# Patient Record
Sex: Female | Born: 1975 | Race: White | Hispanic: No | Marital: Married | Smoking: Former smoker
Health system: Southern US, Community
[De-identification: ages and names within clinical notes are randomized; demographics above are authoritative.]

## PROBLEM LIST (undated history)

## (undated) ENCOUNTER — Inpatient Hospital Stay (HOSPITAL_COMMUNITY): Payer: Self-pay

## (undated) DIAGNOSIS — Z806 Family history of leukemia: Secondary | ICD-10-CM

## (undated) DIAGNOSIS — Z789 Other specified health status: Secondary | ICD-10-CM

## (undated) DIAGNOSIS — Z803 Family history of malignant neoplasm of breast: Secondary | ICD-10-CM

## (undated) DIAGNOSIS — R12 Heartburn: Secondary | ICD-10-CM

## (undated) DIAGNOSIS — I10 Essential (primary) hypertension: Secondary | ICD-10-CM

## (undated) DIAGNOSIS — K219 Gastro-esophageal reflux disease without esophagitis: Secondary | ICD-10-CM

## (undated) DIAGNOSIS — N979 Female infertility, unspecified: Secondary | ICD-10-CM

## (undated) DIAGNOSIS — R011 Cardiac murmur, unspecified: Secondary | ICD-10-CM

## (undated) DIAGNOSIS — T7840XA Allergy, unspecified, initial encounter: Secondary | ICD-10-CM

## (undated) DIAGNOSIS — C801 Malignant (primary) neoplasm, unspecified: Secondary | ICD-10-CM

## (undated) DIAGNOSIS — O26899 Other specified pregnancy related conditions, unspecified trimester: Secondary | ICD-10-CM

## (undated) HISTORY — DX: Family history of malignant neoplasm of breast: Z80.3

## (undated) HISTORY — PX: WISDOM TOOTH EXTRACTION: SHX21

## (undated) HISTORY — DX: Female infertility, unspecified: N97.9

## (undated) HISTORY — DX: Allergy, unspecified, initial encounter: T78.40XA

## (undated) HISTORY — DX: Family history of leukemia: Z80.6

## (undated) HISTORY — PX: DILATION AND CURETTAGE OF UTERUS: SHX78

---

## 2007-10-01 ENCOUNTER — Inpatient Hospital Stay (HOSPITAL_COMMUNITY): Admission: AD | Admit: 2007-10-01 | Discharge: 2007-10-01 | Payer: Self-pay | Admitting: Obstetrics & Gynecology

## 2008-11-26 ENCOUNTER — Ambulatory Visit (HOSPITAL_COMMUNITY): Admission: RE | Admit: 2008-11-26 | Discharge: 2008-11-26 | Payer: Self-pay | Admitting: Obstetrics and Gynecology

## 2009-10-09 ENCOUNTER — Ambulatory Visit (HOSPITAL_COMMUNITY): Admission: RE | Admit: 2009-10-09 | Discharge: 2009-10-09 | Payer: Self-pay | Admitting: Obstetrics and Gynecology

## 2009-10-16 ENCOUNTER — Ambulatory Visit (HOSPITAL_COMMUNITY): Admission: RE | Admit: 2009-10-16 | Discharge: 2009-10-16 | Payer: Self-pay | Admitting: Obstetrics and Gynecology

## 2009-10-25 DEATH — deceased

## 2010-10-20 LAB — URINALYSIS, ROUTINE W REFLEX MICROSCOPIC
Bilirubin Urine: NEGATIVE
Glucose, UA: NEGATIVE mg/dL
Nitrite: NEGATIVE
Urobilinogen, UA: 0.2 mg/dL (ref 0.0–1.0)

## 2010-10-20 LAB — CBC
HCT: 38.9 % (ref 36.0–46.0)
MCHC: 34 g/dL (ref 30.0–36.0)
Platelets: 294 10*3/uL (ref 150–400)
WBC: 6.6 10*3/uL (ref 4.0–10.5)

## 2010-10-20 LAB — LUPUS ANTICOAGULANT PANEL
Lupus Anticoagulant: NOT DETECTED
PTT Lupus Anticoagulant: 44.6 secs — ABNORMAL HIGH (ref 32.0–43.4)
PTTLA 4:1 Mix: 42 secs (ref 36.3–48.8)

## 2010-10-20 LAB — TSH: TSH: 5.24 u[IU]/mL — ABNORMAL HIGH (ref 0.350–4.500)

## 2010-10-20 LAB — CARDIOLIPIN ANTIBODIES, IGM+IGG: Anticardiolipin IgM: 1 MPL U/mL — ABNORMAL LOW (ref <11)

## 2011-06-10 ENCOUNTER — Other Ambulatory Visit: Payer: Self-pay

## 2011-09-11 LAB — OB RESULTS CONSOLE HEPATITIS B SURFACE ANTIGEN: Hepatitis B Surface Ag: NEGATIVE

## 2011-09-11 LAB — OB RESULTS CONSOLE GC/CHLAMYDIA: Chlamydia: NEGATIVE

## 2011-09-30 ENCOUNTER — Other Ambulatory Visit: Payer: Self-pay | Admitting: Physician Assistant

## 2011-09-30 MED ORDER — PROGESTERONE MICRONIZED 200 MG PO CAPS: 200.0000 mg | ORAL_CAPSULE | Freq: Every day | ORAL | Status: DC

## 2011-09-30 NOTE — Telephone Encounter (Signed)
Pt cannot get in touch with OB for a RF of her progesterone and her Rx from mail order will be delivered in 3 days.  She is a high risk pregnancy and on progesterone to decrease her chances of miscarriage.

## 2012-03-24 ENCOUNTER — Encounter (HOSPITAL_COMMUNITY): Payer: Self-pay

## 2012-03-31 ENCOUNTER — Encounter (HOSPITAL_COMMUNITY): Payer: Self-pay

## 2012-04-01 ENCOUNTER — Encounter (HOSPITAL_COMMUNITY)
Admission: RE | Admit: 2012-04-01 | Discharge: 2012-04-01 | Disposition: A | Discharge status: Home / Self Care | Payer: BC Managed Care – PPO | Source: Ambulatory Visit | Attending: Obstetrics and Gynecology | Admitting: Obstetrics and Gynecology

## 2012-04-01 ENCOUNTER — Encounter (HOSPITAL_COMMUNITY): Payer: Self-pay

## 2012-04-01 HISTORY — DX: Heartburn: R12

## 2012-04-01 HISTORY — DX: Other specified health status: Z78.9

## 2012-04-01 HISTORY — DX: Other specified pregnancy related conditions, unspecified trimester: O26.899

## 2012-04-01 LAB — SURGICAL PCR SCREEN
MRSA, PCR: NEGATIVE
Staphylococcus aureus: NEGATIVE

## 2012-04-01 LAB — CBC
HCT: 36.2 % (ref 36.0–46.0)
Hemoglobin: 12.4 g/dL (ref 12.0–15.0)
MCHC: 34.3 g/dL (ref 30.0–36.0)
RBC: 4.07 MIL/uL (ref 3.87–5.11)
RDW: 13.9 % (ref 11.5–15.5)
WBC: 7.9 10*3/uL (ref 4.0–10.5)

## 2012-04-01 NOTE — Patient Instructions (Addendum)
Your procedure is scheduled on:04/04/12  Enter through the Main Entrance at :10 am Pick up desk phone and dial [phone] and inform us of your arrival.  Please call [phone] if you have any problems the morning of surgery.  Remember: Do not eat after midnight:Sunday Do not drink after:0730 am- water only Take these meds the morning of surgery with a sip of water:none  DO NOT wear jewelry, eye make-up, lipstick,body lotion, or dark fingernail polish. Do not shave for 48 hours prior to surgery.  If you are to be admitted after surgery, leave suitcase in car until your room has been assigned. Patients discharged on the day of surgery will not be allowed to drive home.   Remember to use your Hibiclens as instructed.

## 2012-04-04 ENCOUNTER — Encounter (HOSPITAL_COMMUNITY): Payer: Self-pay | Admitting: Anesthesiology

## 2012-04-04 ENCOUNTER — Encounter (HOSPITAL_COMMUNITY)
Admission: AD | Disposition: A | Discharge status: Home / Self Care | Payer: Self-pay | Source: Ambulatory Visit | Attending: Obstetrics and Gynecology

## 2012-04-04 ENCOUNTER — Inpatient Hospital Stay (HOSPITAL_COMMUNITY): Payer: BC Managed Care – PPO

## 2012-04-04 ENCOUNTER — Encounter (HOSPITAL_COMMUNITY): Payer: Self-pay | Admitting: General Surgery

## 2012-04-04 ENCOUNTER — Inpatient Hospital Stay (HOSPITAL_COMMUNITY)
Admission: AD | Admit: 2012-04-04 | Discharge: 2012-04-06 | DRG: 371 | Disposition: A | Discharge status: Home / Self Care | Payer: BC Managed Care – PPO | Source: Ambulatory Visit | Attending: Obstetrics and Gynecology | Admitting: Obstetrics and Gynecology

## 2012-04-04 ENCOUNTER — Encounter (HOSPITAL_COMMUNITY): Payer: Self-pay

## 2012-04-04 DIAGNOSIS — D259 Leiomyoma of uterus, unspecified: Secondary | ICD-10-CM | POA: Diagnosis present

## 2012-04-04 DIAGNOSIS — O09529 Supervision of elderly multigravida, unspecified trimester: Secondary | ICD-10-CM | POA: Diagnosis present

## 2012-04-04 DIAGNOSIS — O34599 Maternal care for other abnormalities of gravid uterus, unspecified trimester: Secondary | ICD-10-CM | POA: Diagnosis present

## 2012-04-04 DIAGNOSIS — O329XX0 Maternal care for malpresentation of fetus, unspecified, not applicable or unspecified: Secondary | ICD-10-CM

## 2012-04-04 DIAGNOSIS — O321XX0 Maternal care for breech presentation, not applicable or unspecified: Principal | ICD-10-CM | POA: Diagnosis present

## 2012-04-04 DIAGNOSIS — D4959 Neoplasm of unspecified behavior of other genitourinary organ: Secondary | ICD-10-CM | POA: Diagnosis present

## 2012-04-04 DIAGNOSIS — Z01812 Encounter for preprocedural laboratory examination: Secondary | ICD-10-CM

## 2012-04-04 LAB — TYPE AND SCREEN: ABO/RH(D): B POS

## 2012-04-04 SURGERY — Surgical Case
Anesthesia: Spinal | Site: Abdomen | Wound class: Clean Contaminated

## 2012-04-04 MED ORDER — BUPIVACAINE IN DEXTROSE 0.75-8.25 % IT SOLN
INTRATHECAL | Status: DC | PRN
  Administered 2012-04-04: 1.5 mL via INTRATHECAL

## 2012-04-04 MED ORDER — KETOROLAC TROMETHAMINE 30 MG/ML IJ SOLN: 30.0000 mg | Freq: Four times a day (QID) | INTRAMUSCULAR | Status: AC | PRN

## 2012-04-04 MED ORDER — DIPHENHYDRAMINE HCL 50 MG/ML IJ SOLN: 12.5000 mg | INTRAMUSCULAR | Status: DC | PRN

## 2012-04-04 MED ORDER — DIPHENHYDRAMINE HCL 25 MG PO CAPS: 25.0000 mg | ORAL_CAPSULE | ORAL | Status: DC | PRN

## 2012-04-04 MED ORDER — MORPHINE SULFATE (PF) 0.5 MG/ML IJ SOLN
INTRAMUSCULAR | Status: DC | PRN
  Administered 2012-04-04: 150 ug via INTRATHECAL

## 2012-04-04 MED ORDER — SIMETHICONE 80 MG PO CHEW: 80.0000 mg | CHEWABLE_TABLET | ORAL | Status: DC | PRN

## 2012-04-04 MED ORDER — LANOLIN HYDROUS EX OINT: 1.0000 "application " | TOPICAL_OINTMENT | CUTANEOUS | Status: DC | PRN

## 2012-04-04 MED ORDER — ONDANSETRON HCL 4 MG/2ML IJ SOLN: 4.0000 mg | Freq: Three times a day (TID) | INTRAMUSCULAR | Status: DC | PRN

## 2012-04-04 MED ORDER — GENTAMICIN SULFATE 40 MG/ML IJ SOLN
INTRAVENOUS | Status: AC
  Administered 2012-04-04: 100 mL via INTRAVENOUS
  Filled 2012-04-04: qty 9.88

## 2012-04-04 MED ORDER — LACTATED RINGERS IV SOLN
INTRAVENOUS | Status: DC
  Administered 2012-04-04: 19:00:00 via INTRAVENOUS

## 2012-04-04 MED ORDER — ZOLPIDEM TARTRATE 5 MG PO TABS: 5.0000 mg | ORAL_TABLET | Freq: Every evening | ORAL | Status: DC | PRN

## 2012-04-04 MED ORDER — METOCLOPRAMIDE HCL 5 MG/ML IJ SOLN: 10.0000 mg | Freq: Three times a day (TID) | INTRAMUSCULAR | Status: DC | PRN

## 2012-04-04 MED ORDER — SIMETHICONE 80 MG PO CHEW
80.0000 mg | CHEWABLE_TABLET | Freq: Three times a day (TID) | ORAL | Status: DC
  Administered 2012-04-04 – 2012-04-06 (×7): 80 mg via ORAL

## 2012-04-04 MED ORDER — SCOPOLAMINE 1 MG/3DAYS TD PT72
1.0000 | MEDICATED_PATCH | Freq: Once | TRANSDERMAL | Status: DC
  Administered 2012-04-04: 1.5 mg via TRANSDERMAL

## 2012-04-04 MED ORDER — ONDANSETRON HCL 4 MG PO TABS: 4.0000 mg | ORAL_TABLET | ORAL | Status: DC | PRN

## 2012-04-04 MED ORDER — DIBUCAINE 1 % RE OINT: 1.0000 "application " | TOPICAL_OINTMENT | RECTAL | Status: DC | PRN

## 2012-04-04 MED ORDER — IBUPROFEN 600 MG PO TABS
600.0000 mg | ORAL_TABLET | Freq: Four times a day (QID) | ORAL | Status: DC
  Administered 2012-04-04 – 2012-04-06 (×8): 600 mg via ORAL
  Filled 2012-04-04 (×8): qty 1

## 2012-04-04 MED ORDER — OXYCODONE-ACETAMINOPHEN 5-325 MG PO TABS: 1.0000 | ORAL_TABLET | ORAL | Status: DC | PRN

## 2012-04-04 MED ORDER — SODIUM CHLORIDE 0.9 % IJ SOLN: 3.0000 mL | INTRAMUSCULAR | Status: DC | PRN

## 2012-04-04 MED ORDER — NALBUPHINE HCL 10 MG/ML IJ SOLN
5.0000 mg | INTRAMUSCULAR | Status: DC | PRN
  Filled 2012-04-04: qty 1

## 2012-04-04 MED ORDER — FENTANYL CITRATE 0.05 MG/ML IJ SOLN
INTRAMUSCULAR | Status: AC
  Filled 2012-04-04: qty 2

## 2012-04-04 MED ORDER — FENTANYL CITRATE 0.05 MG/ML IJ SOLN
INTRAMUSCULAR | Status: DC | PRN
  Administered 2012-04-04: 25 ug via INTRATHECAL

## 2012-04-04 MED ORDER — PRENATAL MULTIVITAMIN CH
1.0000 | ORAL_TABLET | Freq: Every day | ORAL | Status: DC
  Administered 2012-04-05 – 2012-04-06 (×2): 1 via ORAL
  Filled 2012-04-04 (×2): qty 1

## 2012-04-04 MED ORDER — SENNOSIDES-DOCUSATE SODIUM 8.6-50 MG PO TABS
2.0000 | ORAL_TABLET | Freq: Every day | ORAL | Status: DC
  Administered 2012-04-04 – 2012-04-05 (×2): 2 via ORAL

## 2012-04-04 MED ORDER — ONDANSETRON HCL 4 MG/2ML IJ SOLN
INTRAMUSCULAR | Status: DC | PRN
  Administered 2012-04-04: 4 mg via INTRAVENOUS

## 2012-04-04 MED ORDER — ONDANSETRON HCL 4 MG/2ML IJ SOLN: 4.0000 mg | INTRAMUSCULAR | Status: DC | PRN

## 2012-04-04 MED ORDER — ONDANSETRON HCL 4 MG/2ML IJ SOLN
INTRAMUSCULAR | Status: AC
  Filled 2012-04-04: qty 2

## 2012-04-04 MED ORDER — PHENYLEPHRINE HCL 10 MG/ML IJ SOLN
INTRAMUSCULAR | Status: DC | PRN
  Administered 2012-04-04 (×2): 80 ug via INTRAVENOUS
  Administered 2012-04-04: 40 ug via INTRAVENOUS

## 2012-04-04 MED ORDER — MORPHINE SULFATE 0.5 MG/ML IJ SOLN
INTRAMUSCULAR | Status: AC
  Filled 2012-04-04: qty 10

## 2012-04-04 MED ORDER — FENTANYL CITRATE 0.05 MG/ML IJ SOLN: 25.0000 ug | INTRAMUSCULAR | Status: DC | PRN

## 2012-04-04 MED ORDER — LACTATED RINGERS IV SOLN
INTRAVENOUS | Status: DC
  Administered 2012-04-04 (×4): via INTRAVENOUS
  Administered 2012-04-04: 125 mL/h via INTRAVENOUS

## 2012-04-04 MED ORDER — NALOXONE HCL 0.4 MG/ML IJ SOLN: 0.4000 mg | INTRAMUSCULAR | Status: DC | PRN

## 2012-04-04 MED ORDER — WITCH HAZEL-GLYCERIN EX PADS: 1.0000 "application " | MEDICATED_PAD | CUTANEOUS | Status: DC | PRN

## 2012-04-04 MED ORDER — OXYTOCIN 10 UNIT/ML IJ SOLN
INTRAMUSCULAR | Status: AC
  Filled 2012-04-04: qty 4

## 2012-04-04 MED ORDER — NALBUPHINE HCL 10 MG/ML IJ SOLN
5.0000 mg | INTRAMUSCULAR | Status: DC | PRN
Start: 2012-04-04 — End: 2012-04-06
  Administered 2012-04-04: 10 mg via INTRAVENOUS
  Filled 2012-04-04 (×2): qty 1

## 2012-04-04 MED ORDER — MEPERIDINE HCL 25 MG/ML IJ SOLN: 6.2500 mg | INTRAMUSCULAR | Status: DC | PRN

## 2012-04-04 MED ORDER — PHENYLEPHRINE 40 MCG/ML (10ML) SYRINGE FOR IV PUSH (FOR BLOOD PRESSURE SUPPORT)
PREFILLED_SYRINGE | INTRAVENOUS | Status: AC
  Filled 2012-04-04: qty 5

## 2012-04-04 MED ORDER — TETANUS-DIPHTH-ACELL PERTUSSIS 5-2.5-18.5 LF-MCG/0.5 IM SUSP
0.5000 mL | Freq: Once | INTRAMUSCULAR | Status: AC
  Administered 2012-04-05: 0.5 mL via INTRAMUSCULAR
  Filled 2012-04-04: qty 0.5

## 2012-04-04 MED ORDER — SCOPOLAMINE 1 MG/3DAYS TD PT72
MEDICATED_PATCH | TRANSDERMAL | Status: AC
  Administered 2012-04-04: 1.5 mg via TRANSDERMAL
  Filled 2012-04-04: qty 1

## 2012-04-04 MED ORDER — DIPHENHYDRAMINE HCL 25 MG PO CAPS: 25.0000 mg | ORAL_CAPSULE | Freq: Four times a day (QID) | ORAL | Status: DC | PRN

## 2012-04-04 MED ORDER — MENTHOL 3 MG MT LOZG: 1.0000 | LOZENGE | OROMUCOSAL | Status: DC | PRN

## 2012-04-04 MED ORDER — OXYTOCIN 40 UNITS IN LACTATED RINGERS INFUSION - SIMPLE MED: 62.5000 mL/h | INTRAVENOUS | Status: AC

## 2012-04-04 MED ORDER — SODIUM CHLORIDE 0.9 % IV SOLN: 1.0000 ug/kg/h | INTRAVENOUS | Status: DC | PRN

## 2012-04-04 MED ORDER — IBUPROFEN 600 MG PO TABS: 600.0000 mg | ORAL_TABLET | Freq: Four times a day (QID) | ORAL | Status: DC | PRN

## 2012-04-04 MED ORDER — OXYTOCIN 10 UNIT/ML IJ SOLN
40.0000 [IU] | INTRAMUSCULAR | Status: DC | PRN
  Administered 2012-04-04: 40 [IU] via INTRAVENOUS

## 2012-04-04 MED ORDER — DIPHENHYDRAMINE HCL 50 MG/ML IJ SOLN: 25.0000 mg | INTRAMUSCULAR | Status: DC | PRN

## 2012-04-04 MED ORDER — LACTATED RINGERS IV SOLN
INTRAVENOUS | Status: DC
  Administered 2012-04-04: 12:00:00 via INTRAVENOUS

## 2012-04-04 SURGICAL SUPPLY — 38 items
CLOTH BEACON ORANGE TIMEOUT ST (SAFETY) ×2 IMPLANT
DERMABOND ADVANCED (GAUZE/BANDAGES/DRESSINGS) ×1
DERMABOND ADVANCED .7 DNX12 (GAUZE/BANDAGES/DRESSINGS) ×1 IMPLANT
DRESSING TELFA 8X3 (GAUZE/BANDAGES/DRESSINGS) ×2 IMPLANT
DRSG COVADERM 4X10 (GAUZE/BANDAGES/DRESSINGS) IMPLANT
ELECT REM PT RETURN 9FT ADLT (ELECTROSURGICAL) ×2
ELECTRODE REM PT RTRN 9FT ADLT (ELECTROSURGICAL) ×1 IMPLANT
EXTRACTOR VACUUM M CUP 4 TUBE (SUCTIONS) IMPLANT
GAUZE SPONGE 4X4 12PLY STRL LF (GAUZE/BANDAGES/DRESSINGS) ×4 IMPLANT
GLOVE BIO SURGEON STRL SZ7 (GLOVE) ×4 IMPLANT
GLOVE BIOGEL PI IND STRL 7.5 (GLOVE) ×1 IMPLANT
GLOVE BIOGEL PI INDICATOR 7.5 (GLOVE) ×1
GLOVE ECLIPSE 7.5 STRL STRAW (GLOVE) ×2 IMPLANT
GLOVE NEODERM STER SZ 7 (GLOVE) ×2 IMPLANT
GOWN PREVENTION PLUS LG XLONG (DISPOSABLE) ×4 IMPLANT
KIT ABG SYR 3ML LUER SLIP (SYRINGE) IMPLANT
NEEDLE HYPO 25X5/8 SAFETYGLIDE (NEEDLE) IMPLANT
NS IRRIG 1000ML POUR BTL (IV SOLUTION) ×2 IMPLANT
PACK C SECTION WH (CUSTOM PROCEDURE TRAY) ×2 IMPLANT
PAD ABD 7.5X8 STRL (GAUZE/BANDAGES/DRESSINGS) ×2 IMPLANT
PAD OB MATERNITY 4.3X12.25 (PERSONAL CARE ITEMS) IMPLANT
RETRACTOR WND ALEXIS 25 LRG (MISCELLANEOUS) ×1 IMPLANT
RTRCTR C-SECT PINK 25CM LRG (MISCELLANEOUS) IMPLANT
RTRCTR C-SECT PINK 34CM XLRG (MISCELLANEOUS) IMPLANT
RTRCTR WOUND ALEXIS 25CM LRG (MISCELLANEOUS) ×2
SLEEVE SCD COMPRESS KNEE MED (MISCELLANEOUS) IMPLANT
STAPLER VISISTAT 35W (STAPLE) IMPLANT
SUT CHROMIC 1 CTX 36 (SUTURE) ×6 IMPLANT
SUT CHROMIC 2 0 CT 1 (SUTURE) ×2 IMPLANT
SUT PDS AB 0 CTX 60 (SUTURE) ×2 IMPLANT
SUT PLAIN 2 0 XLH (SUTURE) ×2 IMPLANT
SUT VIC AB 2-0 CT1 27 (SUTURE) ×1
SUT VIC AB 2-0 CT1 TAPERPNT 27 (SUTURE) ×1 IMPLANT
SUT VIC AB 4-0 KS 27 (SUTURE) IMPLANT
TOWEL OR 17X24 6PK STRL BLUE (TOWEL DISPOSABLE) ×4 IMPLANT
TRAY FOLEY CATH 14FR (SET/KITS/TRAYS/PACK) ×2 IMPLANT
WATER STERILE IRR 1000ML POUR (IV SOLUTION) ×2 IMPLANT
YANKAUER SUCT BULB TIP NO VENT (SUCTIONS) ×2 IMPLANT

## 2012-04-04 NOTE — Anesthesia Postprocedure Evaluation (Signed)
  Anesthesia Post-op Note  Patient: Stacey Singh  Procedure(s) Performed: Procedure(s) (LRB) with comments: CESAREAN SECTION (N/A)  Patient Location: Mother/Baby  Anesthesia Type: Spinal  Level of Consciousness: awake, alert  and oriented  Airway and Oxygen Therapy: Patient Spontanous Breathing  Post-op Pain: none  Post-op Assessment: Post-op Vital signs reviewed, Patient's Cardiovascular Status Stable, No headache, No backache, No residual numbness and No residual motor weakness  Post-op Vital Signs: Reviewed and stable  Complications: No apparent anesthesia complications

## 2012-04-04 NOTE — Op Note (Signed)
Pre-Operative Diagnosis: 1) 39+5 week Intrauterine Pregnancy 2) Homero Fellers Breech Presentation Postoperative Diagnosis: Same and anterior pedunculated uterine fibroid Procedure: Primary Low Transverse Cesarean Section Surgeon: Dr. Waynard Reeds Assistant: Dr. Lodema Hong Operative Findings: Vigorous female infant in the frank breech presentation with apgars of 9 & 9.  Anterior fundal pedunculated fibroid ~ 3cm. Normal ovaries and tubes bilaterally Specimen: Placenta for disposal EBL: Total I/O In: 2400 [I.V.:2400] Out: 900 [Urine:300; Blood:600]   Procedure:Stacey Singh is an 36 year old gravida 4 para 0030 at 17 weeks and 5 days estimated gestational age who presents for cesarean section. The patient was known to have a fetus in the complete breech presentation and declined version. Breech was confirmed by ultrasound before the procedure. Following the appropriate informed consent the patient was brought to the operating room where spinal anesthesia was administered and found to be adequate. She was placed in the dorsal supine position with a leftward tilt. She was prepped and draped in the normal sterile fashion. Scalpel was then used to make a Pfannenstiel skin incision which was carried down to the underlying layers of soft tissue to the fascia. The fascia was incised in the midline and the fascial incision was extended laterally with Mayo scissors. The superior aspect of the fascial incision was grasped with Coker clamps x2, tented up and the rectus muscles dissected off sharply with the electrocautery unit area and the same procedure was repeated on the inferior aspect of the fascial incision. The rectus muscles were separated in the midline. The abdominal peritoneum was identified, tented up, entered sharply, and the incision was extended superiorly and inferiorly with good visualization of the bladder. The Alexis retractor was then deployed. The vesicouterine peritoneum was identified, tented up, entered  sharply, and the bladder flap was created digitally. Scalpel was then used to make a low transverse incision on the uterus which was extended laterally with both blunt dissection and the bandage scissors. The fetal vertex was identified, delivered easily through the uterine incision followed by the body. The infant was bulb suctioned on the operative field cried vigorously, cord was clamped and cut and the infant was passed to the waiting neonatologist. Placenta was then delivered spontaneously, the uterus was cleared of all clot and debris. The uterine incision was repaired with #1 chromic in running locked fashion followed by a second imbricating layer. Ovaries and tubes were inspected and normal except for a pedunculated anterior uterine fibroid. The Alexis retractor was removed. The uterus was returned to the abdominal cavity the abdominal cavity was cleared of all clot and debris. The abdominal peritoneum was reapproximated with 2-0 Vicryl in a running fashion, the rectus muscles was reapproximated with #1 chromic in a running fashion. The fascia was closed with a looped PDS in a running fashion. The subcuticular layer was repaired with 2-0 plain gut. The skin was closed with 4-0 vicryl in a subcuticular fashion and Dermabond. All sponge lap and needle counts were correct x2. Patient tolerated the procedure well and recovered in stable condition following the procedure.

## 2012-04-04 NOTE — Anesthesia Preprocedure Evaluation (Signed)
Anesthesia Evaluation  Patient identified by MRN, date of birth, ID band Patient awake    Reviewed: Allergy & Precautions, H&P , Patient's Chart, lab work & pertinent test results  Airway Mallampati: II TM Distance: >3 FB Neck ROM: Full    Dental No notable dental hx. (+) Teeth Intact   Pulmonary neg pulmonary ROS,  breath sounds clear to auscultation  Pulmonary exam normal       Cardiovascular negative cardio ROS  Rhythm:Regular Rate:Normal     Neuro/Psych negative neurological ROS  negative psych ROS   GI/Hepatic Neg liver ROS, Medicated and Controlled,  Endo/Other  Morbid obesity  Renal/GU negative Renal ROS     Musculoskeletal   Abdominal Normal abdominal exam  (+)   Peds  Hematology negative hematology ROS (+)   Anesthesia Other Findings   Reproductive/Obstetrics (+) Pregnancy Breech Presentation                           Anesthesia Physical Anesthesia Plan  ASA: III  Anesthesia Plan: Spinal   Post-op Pain Management:    Induction:   Airway Management Planned:   Additional Equipment:   Intra-op Plan:   Post-operative Plan:   Informed Consent: I have reviewed the patients History and Physical, chart, labs and discussed the procedure including the risks, benefits and alternatives for the proposed anesthesia with the patient or authorized representative who has indicated his/her understanding and acceptance.     Plan Discussed with: Anesthesiologist  Anesthesia Plan Comments:         Anesthesia Quick Evaluation

## 2012-04-04 NOTE — Addendum Note (Signed)
Addendum  created 04/04/12 1759 by Shanon Payor, CRNA   Modules edited:Notes Section

## 2012-04-04 NOTE — Anesthesia Postprocedure Evaluation (Signed)
  Anesthesia Post-op Note  Patient: Stacey Singh  Procedure(s) Performed: Procedure(s) (LRB) with comments: CESAREAN SECTION (N/A)  Patient Location: PACU  Anesthesia Type: Spinal  Level of Consciousness: awake, alert  and oriented  Airway and Oxygen Therapy: Patient Spontanous Breathing  Post-op Pain: none  Post-op Assessment: Post-op Vital signs reviewed, Patient's Cardiovascular Status Stable, Respiratory Function Stable, Patent Airway, No signs of Nausea or vomiting, Pain level controlled, No headache and No backache  Post-op Vital Signs: Reviewed and stable  Complications: No apparent anesthesia complications

## 2012-04-04 NOTE — Interval H&P Note (Signed)
History and Physical Interval Note:  04/04/2012 11:36 AM  Stacey Singh  has presented today for surgery, with the diagnosis of BREECH  The various methods of treatment have been discussed with the patient and family. After consideration of risks, benefits and other options for treatment, the patient has consented to  Procedure(s) (LRB) with comments: CESAREAN SECTION (N/A) as a surgical intervention .  The patient's history has been reviewed, patient examined, no change in status, stable for surgery.  I have reviewed the patient's chart and labs.  Questions were answered to the patient's satisfaction.     [name]

## 2012-04-04 NOTE — Transfer of Care (Signed)
Immediate Anesthesia Transfer of Care Note  Patient: Stacey Singh  Procedure(s) Performed: Procedure(s) (LRB) with comments: CESAREAN SECTION (N/A)  Patient Location: PACU  Anesthesia Type: Spinal  Level of Consciousness: awake  Airway & Oxygen Therapy: Patient Spontanous Breathing  Post-op Assessment: Report given to PACU RN  Post vital signs: Reviewed and stable  Complications: No apparent anesthesia complications

## 2012-04-04 NOTE — Anesthesia Procedure Notes (Signed)
Spinal  Patient location during procedure: OR Start time: 04/04/2012 11:43 AM Staffing Anesthesiologist: Euclide Granito A. Performed by: anesthesiologist  Preanesthetic Checklist Completed: patient identified, site marked, surgical consent, pre-op evaluation, timeout performed, IV checked, risks and benefits discussed and monitors and equipment checked Spinal Block Patient position: sitting Prep: site prepped and draped and DuraPrep Patient monitoring: heart rate, cardiac monitor, continuous pulse ox and blood pressure Approach: midline Location: L3-4 Injection technique: single-shot Needle Needle type: Sprotte  Needle gauge: 24 G Needle length: 9 cm Needle insertion depth: 7 cm Assessment Sensory level: T2 Additional Notes Patient tolerated procedure well. Adequate sensory level.

## 2012-04-04 NOTE — H&P (Signed)
Stacey Singh is a 36 y.o. female presenting for primary c/s for breechHistory 36 yo G4P0030 @ 39+5 presents for scheduled c/s for breech.  U/s at 38 weeks showed Breech presentation. Pt declined version.  Pregnancy has been complicated by AMA and obesity.  Pt declined genetic screening.  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 OB History    Grav Para Term Preterm Abortions TAB SAB Ect Mult Living   4    3 0 3   0     Past Medical History  Diagnosis Date  . Heartburn in pregnancy   . No pertinent past medical history    Past Surgical History  Procedure Date  . Dilation and curettage of uterus   . Wisdom tooth extraction    Family History: family history is not on file. Social History:  reports that she has never smoked. She does not have any smokeless tobacco history on file. She reports that she does not drink alcohol or use illicit drugs.   Prenatal Transfer Tool  Maternal Diabetes: No Genetic Screening: Declined Maternal Ultrasounds/Referrals: Normal Fetal Ultrasounds or other Referrals:  None Maternal Substance Abuse:  No Significant Maternal Medications:  None Significant Maternal Lab Results:  None Other Comments:  None  ROS: as above    Blood pressure 144/91, pulse 100, temperature 98.8 F (37.1 C), temperature source Oral, resp. rate 16, height 5\' 3"  (1.6 m), weight 118.842 kg (262 lb), last menstrual period 07/01/2011, SpO2 98.00%. Exam Physical Exam  Prenatal labs: ABO, Rh: --/--/B POS (09/09 1005) Antibody: PENDING (09/09 1005) Rubella: Equivocal (02/15 0000) RPR: NON REACTIVE (09/06 0900)  HBsAg: Negative (02/15 0000)  HIV: Non-reactive (02/15 0000)  GBS:   Negative  Assessment/Plan: 36 yo for primary c/s for breech 1) consent 2) PCN allergic, gent + clinda for abx prophylaxis 3) SCDs for DVT prophylaxis  Stacey Singh H. 04/04/2012, 11:19 AM

## 2012-04-05 ENCOUNTER — Encounter (HOSPITAL_COMMUNITY): Payer: Self-pay | Admitting: Obstetrics and Gynecology

## 2012-04-05 LAB — CBC
MCH: 30 pg (ref 26.0–34.0)
Platelets: 206 10*3/uL (ref 150–400)
RBC: 3.77 MIL/uL — ABNORMAL LOW (ref 3.87–5.11)
WBC: 10.9 10*3/uL — ABNORMAL HIGH (ref 4.0–10.5)

## 2012-04-05 LAB — CCBB MATERNAL DONOR DRAW

## 2012-04-05 NOTE — Progress Notes (Signed)
Patient is eating, ambulating, voiding.  Pain control is good. Appropriate lochia.  No complaints.  Filed Vitals:   04/04/12 2153 04/05/12 0010 04/05/12 0404 04/05/12 0800  BP: 105/68 101/67 109/72 113/73  Pulse: 94 93 76 72  Temp: 98 F (36.7 C) 99.1 F (37.3 C) 98.5 F (36.9 C) 98.6 F (37 C)  TempSrc: Oral Oral Oral Oral  Resp: 18 18 16 18   Height:      Weight:      SpO2: 95% 97% 97% 98%    Fundus firm Incision- c/d/i, no erythema No CT  Lab Results  Component Value Date   WBC 10.9* 04/05/2012   HGB 11.3* 04/05/2012   HCT 34.2* 04/05/2012   MCV 90.7 04/05/2012   PLT 206 04/05/2012    --/--/B POS (09/09 1005)/Req  A/P Post op day #1 s/p c/s for breech.  Routine care.   MMR postpartum for rubella equivocal.  Claiborne Billings, Amer Alcindor

## 2012-04-06 MED ORDER — OXYCODONE-ACETAMINOPHEN 5-325 MG PO TABS: 2.0000 | ORAL_TABLET | ORAL | Status: AC | PRN

## 2012-04-06 MED ORDER — DOCUSATE SODIUM 100 MG PO CAPS: 100.0000 mg | ORAL_CAPSULE | Freq: Two times a day (BID) | ORAL | Status: AC

## 2012-04-06 MED ORDER — SIMETHICONE 80 MG PO CHEW: 80.0000 mg | CHEWABLE_TABLET | Freq: Four times a day (QID) | ORAL | Status: DC | PRN

## 2012-04-06 MED ORDER — IBUPROFEN 600 MG PO TABS: 600.0000 mg | ORAL_TABLET | Freq: Four times a day (QID) | ORAL | Status: AC | PRN

## 2012-04-06 MED ORDER — MEASLES, MUMPS & RUBELLA VAC ~~LOC~~ INJ
0.5000 mL | INJECTION | Freq: Once | SUBCUTANEOUS | Status: AC
  Administered 2012-04-06: 0.5 mL via SUBCUTANEOUS
  Filled 2012-04-06: qty 0.5

## 2012-04-06 NOTE — Discharge Summary (Signed)
Obstetric Discharge Summary Reason for Admission: cesarean section Prenatal Procedures: ultrasound Intrapartum Procedures: cesarean: low cervical, transverse Postpartum Procedures: none Complications-Operative and Postpartum: none Hemoglobin  Date Value Range Status  04/05/2012 11.3* 12.0 - 15.0 g/dL Final     HCT  Date Value Range Status  04/05/2012 34.2* 36.0 - 46.0 % Final    Physical Exam:  General: alert, cooperative and appears stated age 36: appropriate Uterine Fundus: firm  Discharge Diagnoses: Term Pregnancy-delivered  Discharge Information: Date: 04/06/2012 Activity: pelvic rest Diet: routine Medications: Ibuprofen, Colace, Percocet and Simethicone Condition: improved Instructions: refer to practice specific booklet Discharge to: home Follow-up Information    Follow up with Almon Hercules., MD. Schedule an appointment as soon as possible for a visit in 4 weeks. (For a postpartum evalaution)    Contact information:   [address] [place] [address] [phone]          Newborn Data: Live born female  Birth Weight: 8 lb 5 oz (3770 g) APGAR: 9, 9  Home with mother.  Tahnee Cifuentes H. 04/06/2012, 11:15 AM

## 2013-01-31 ENCOUNTER — Telehealth: Payer: Self-pay | Admitting: Physician Assistant

## 2013-01-31 MED ORDER — OLOPATADINE HCL 0.1 % OP SOLN: 1.0000 [drp] | Freq: Two times a day (BID) | OPHTHALMIC | Status: DC

## 2013-01-31 NOTE — Telephone Encounter (Signed)
Pt is moving and is having extreme eye itching and allergies.

## 2013-06-20 ENCOUNTER — Other Ambulatory Visit: Payer: Self-pay | Admitting: Obstetrics and Gynecology

## 2013-10-19 ENCOUNTER — Other Ambulatory Visit: Payer: Self-pay | Admitting: Physician Assistant

## 2013-10-19 MED ORDER — GENTAMICIN SULFATE 0.3 % OP SOLN: 1.0000 [drp] | OPHTHALMIC | Status: DC

## 2014-05-28 ENCOUNTER — Encounter (HOSPITAL_COMMUNITY): Payer: Self-pay | Admitting: Obstetrics and Gynecology

## 2014-08-01 ENCOUNTER — Other Ambulatory Visit: Payer: Self-pay | Admitting: Obstetrics and Gynecology

## 2014-08-02 LAB — CYTOLOGY - PAP

## 2014-11-08 ENCOUNTER — Other Ambulatory Visit: Payer: Self-pay | Admitting: Obstetrics and Gynecology

## 2014-11-13 LAB — OB RESULTS CONSOLE ANTIBODY SCREEN: Antibody Screen: NEGATIVE

## 2014-11-13 LAB — OB RESULTS CONSOLE GC/CHLAMYDIA
CHLAMYDIA, DNA PROBE: NEGATIVE
GC PROBE AMP, GENITAL: NEGATIVE

## 2014-11-13 LAB — OB RESULTS CONSOLE HEPATITIS B SURFACE ANTIGEN: HEP B S AG: NEGATIVE

## 2014-11-13 LAB — OB RESULTS CONSOLE ABO/RH: RH Type: POSITIVE

## 2014-11-13 LAB — OB RESULTS CONSOLE HIV ANTIBODY (ROUTINE TESTING): HIV: NONREACTIVE

## 2014-11-13 LAB — OB RESULTS CONSOLE RPR: RPR: NONREACTIVE

## 2015-04-19 ENCOUNTER — Other Ambulatory Visit (HOSPITAL_COMMUNITY)
Admission: AD | Admit: 2015-04-19 | Discharge: 2015-04-19 | Disposition: A | Discharge status: Home / Self Care | Payer: BC Managed Care – PPO | Source: Ambulatory Visit | Attending: Obstetrics | Admitting: Obstetrics

## 2015-04-19 ENCOUNTER — Inpatient Hospital Stay (HOSPITAL_COMMUNITY)
Admission: AD | Admit: 2015-04-19 | Discharge: 2015-04-19 | Disposition: A | Discharge status: Home / Self Care | Payer: BC Managed Care – PPO | Source: Ambulatory Visit | Attending: Obstetrics and Gynecology | Admitting: Obstetrics and Gynecology

## 2015-04-19 ENCOUNTER — Encounter (HOSPITAL_COMMUNITY): Payer: Self-pay | Admitting: *Deleted

## 2015-04-19 DIAGNOSIS — R030 Elevated blood-pressure reading, without diagnosis of hypertension: Secondary | ICD-10-CM | POA: Insufficient documentation

## 2015-04-19 DIAGNOSIS — Z880 Allergy status to penicillin: Secondary | ICD-10-CM | POA: Diagnosis not present

## 2015-04-19 DIAGNOSIS — O26893 Other specified pregnancy related conditions, third trimester: Secondary | ICD-10-CM | POA: Insufficient documentation

## 2015-04-19 DIAGNOSIS — O9989 Other specified diseases and conditions complicating pregnancy, childbirth and the puerperium: Secondary | ICD-10-CM

## 2015-04-19 DIAGNOSIS — IMO0001 Reserved for inherently not codable concepts without codable children: Secondary | ICD-10-CM

## 2015-04-19 DIAGNOSIS — Z3A34 34 weeks gestation of pregnancy: Secondary | ICD-10-CM | POA: Insufficient documentation

## 2015-04-19 LAB — COMPREHENSIVE METABOLIC PANEL
ALBUMIN: 3 g/dL — AB (ref 3.5–5.0)
ALT: 12 U/L — AB (ref 14–54)
AST: 15 U/L (ref 15–41)
Alkaline Phosphatase: 77 U/L (ref 38–126)
Anion gap: 10 (ref 5–15)
CHLORIDE: 103 mmol/L (ref 101–111)
CO2: 22 mmol/L (ref 22–32)
CREATININE: 0.34 mg/dL — AB (ref 0.44–1.00)
Calcium: 8.7 mg/dL — ABNORMAL LOW (ref 8.9–10.3)
GFR calc Af Amer: >60 mL/min (ref >60)
GFR calc non Af Amer: >60 mL/min (ref >60)
GLUCOSE: 98 mg/dL (ref 65–99)
POTASSIUM: 3.4 mmol/L — AB (ref 3.5–5.1)
Sodium: 135 mmol/L (ref 135–145)
Total Bilirubin: 0.3 mg/dL (ref 0.3–1.2)
Total Protein: 6.6 g/dL (ref 6.5–8.1)

## 2015-04-19 LAB — URINALYSIS, ROUTINE W REFLEX MICROSCOPIC
BILIRUBIN URINE: NEGATIVE
Glucose, UA: NEGATIVE mg/dL
Hgb urine dipstick: NEGATIVE
Ketones, ur: 15 mg/dL — AB
LEUKOCYTES UA: NEGATIVE
NITRITE: NEGATIVE
PH: 5.5 (ref 5.0–8.0)
Protein, ur: NEGATIVE mg/dL
SPECIFIC GRAVITY, URINE: 1.01 (ref 1.005–1.030)
UROBILINOGEN UA: 0.2 mg/dL (ref 0.0–1.0)

## 2015-04-19 LAB — CBC
HCT: 34.4 % — ABNORMAL LOW (ref 36.0–46.0)
Hemoglobin: 11.7 g/dL — ABNORMAL LOW (ref 12.0–15.0)
MCH: 30.8 pg (ref 26.0–34.0)
MCHC: 34 g/dL (ref 30.0–36.0)
MCV: 90.5 fL (ref 78.0–100.0)
PLATELETS: 195 10*3/uL (ref 150–400)
RBC: 3.8 MIL/uL — AB (ref 3.87–5.11)
RDW: 13.5 % (ref 11.5–15.5)
WBC: 9.2 10*3/uL (ref 4.0–10.5)

## 2015-04-19 LAB — LACTATE DEHYDROGENASE: LDH: 129 U/L (ref 98–192)

## 2015-04-19 LAB — URIC ACID: URIC ACID, SERUM: 3.4 mg/dL (ref 2.3–6.6)

## 2015-04-19 LAB — PROTEIN / CREATININE RATIO, URINE: CREATININE, URINE: 28 mg/dL

## 2015-04-19 NOTE — MAU Note (Signed)
Sent from office for pre-eclampsia eval

## 2015-04-19 NOTE — MAU Note (Signed)
Pt states she has some slight increased bps and she is going our of town this weekend and that the MD wanted her to some to the hospital to be evaluated for preeclampsia.

## 2015-04-19 NOTE — MAU Provider Note (Signed)
History     CSN: [id number]  Arrival date and time: 04/19/15 1054   First Provider Initiated Contact with Patient 04/19/15 1110      Chief Complaint  Patient presents with  . Hypertension   HPI Ms. Stacey Singh is a 39 y.o. [id number] at Unknown who presents to MAU today from the office for further evaluation of elevated blood pressure. The patient denies headache, blurred vision, floaters, abdominal pain, contractions, vaginal bleeding, LOF or complications with the pregnancy. She reports good fetal movement.   OB History    Gravida Para Term Preterm AB TAB SAB Ectopic Multiple Living   5 1 1  3  0 3   1      Past Medical History  Diagnosis Date  . Heartburn in pregnancy   . No pertinent past medical history     Past Surgical History  Procedure Laterality Date  . Dilation and curettage of uterus    . Wisdom tooth extraction    . Cesarean section  04/04/2012    Procedure: CESAREAN SECTION;  Surgeon: Farrel Gobble. Harrington Challenger, MD;  Location: [facility];  Service: Obstetrics;  Laterality: N/A;    No family history on file.  Social History  Substance Use Topics  . Smoking status: Never Smoker   . Smokeless tobacco: Not on file  . Alcohol Use: No    Allergies:  Allergies  Allergen Reactions  . Penicillins     Childhood reaction    Prescriptions prior to admission  Medication Sig Dispense Refill Last Dose  . guaiFENesin (MUCINEX) 600 MG 12 hr tablet Take 600 mg by mouth 2 (two) times daily.   04/18/2015 at Unknown time  . Prenatal Vit-Fe Fumarate-FA (PRENATAL MULTIVITAMIN) TABS Take 1 tablet by mouth daily.   04/18/2015 at Unknown time  . simethicone (MYLICON) 80 MG chewable tablet Chew 80 mg by mouth every 6 (six) hours as needed for flatulence.   04/18/2015 at Unknown time  . gentamicin (GARAMYCIN) 0.3 % ophthalmic solution Place 1 drop into both eyes every 4 (four) hours. (Patient not taking: Reported on 04/19/2015) 5 mL 0   . olopatadine (PATANOL) 0.1 % ophthalmic solution Place 1  drop into both eyes 2 (two) times daily. (Patient not taking: Reported on 04/19/2015) 5 mL 12   . simethicone (GAS-X) 80 MG chewable tablet Chew 1 tablet (80 mg total) by mouth every 6 (six) hours as needed for flatulence. 30 tablet 0     Review of Systems  Constitutional: Negative for fever and malaise/fatigue.  Eyes: Negative for blurred vision.       Neg - floaters  Cardiovascular: Negative for leg swelling.  Gastrointestinal: Negative for abdominal pain.  Neurological: Negative for headaches.   Physical Exam   Blood pressure 120/59, pulse 96, temperature 98.2 F (36.8 C), temperature source Oral, resp. rate 20, unknown if currently breastfeeding.  Physical Exam  Nursing note and vitals reviewed. Constitutional: She is oriented to person, place, and time. She appears well-developed and well-nourished. No distress.  HENT:  Head: Normocephalic and atraumatic.  Cardiovascular: Normal rate.   Respiratory: Effort normal.  GI: Soft. She exhibits no distension and no mass. There is no tenderness. There is no rebound and no guarding.  Musculoskeletal: She exhibits no edema.  Neurological: She is alert and oriented to person, place, and time. She has normal reflexes.  No clonus  Skin: Skin is warm and dry. No erythema.  Psychiatric: She has a normal mood and affect.   Results for  orders placed or performed during the hospital encounter of 04/19/15 (from the past 24 hour(s))  CBC     Status: Abnormal   Collection Time: 04/19/15 11:24 AM  Result Value Ref Range   WBC 9.2 4.0 - 10.5 K/uL   RBC 3.80 (L) 3.87 - 5.11 MIL/uL   Hemoglobin 11.7 (L) 12.0 - 15.0 g/dL   HCT 34.4 (L) 36.0 - 46.0 %   MCV 90.5 78.0 - 100.0 fL   MCH 30.8 26.0 - 34.0 pg   MCHC 34.0 30.0 - 36.0 g/dL   RDW 13.5 11.5 - 15.5 %   Platelets 195 150 - 400 K/uL  Comprehensive metabolic panel     Status: Abnormal   Collection Time: 04/19/15 11:24 AM  Result Value Ref Range   Sodium 135 135 - 145 mmol/L   Potassium 3.4  (L) 3.5 - 5.1 mmol/L   Chloride 103 101 - 111 mmol/L   CO2 22 22 - 32 mmol/L   Glucose, Bld 98 65 - 99 mg/dL   BUN <5 (L) 6 - 20 mg/dL   Creatinine, Ser 0.34 (L) 0.44 - 1.00 mg/dL   Calcium 8.7 (L) 8.9 - 10.3 mg/dL   Total Protein 6.6 6.5 - 8.1 g/dL   Albumin 3.0 (L) 3.5 - 5.0 g/dL   AST 15 15 - 41 U/L   ALT 12 (L) 14 - 54 U/L   Alkaline Phosphatase 77 38 - 126 U/L   Total Bilirubin 0.3 0.3 - 1.2 mg/dL   GFR calc non Af Amer >60 >60 mL/min   GFR calc Af Amer >60 >60 mL/min   Anion gap 10 5 - 15  Uric acid     Status: None   Collection Time: 04/19/15 11:24 AM  Result Value Ref Range   Uric Acid, Serum 3.4 2.3 - 6.6 mg/dL  Lactate dehydrogenase     Status: None   Collection Time: 04/19/15 11:24 AM  Result Value Ref Range   LDH 129 98 - 192 U/L  Urinalysis, Routine w reflex microscopic (not at [facility])     Status: Abnormal   Collection Time: 04/19/15 12:00 PM  Result Value Ref Range   Color, Urine YELLOW YELLOW   APPearance CLEAR CLEAR   Specific Gravity, Urine 1.010 1.005 - 1.030   pH 5.5 5.0 - 8.0   Glucose, UA NEGATIVE NEGATIVE mg/dL   Hgb urine dipstick NEGATIVE NEGATIVE   Bilirubin Urine NEGATIVE NEGATIVE   Ketones, ur 15 (A) NEGATIVE mg/dL   Protein, ur NEGATIVE NEGATIVE mg/dL   Urobilinogen, UA 0.2 0.0 - 1.0 mg/dL   Nitrite NEGATIVE NEGATIVE   Leukocytes, UA NEGATIVE NEGATIVE  Protein / creatinine ratio, urine     Status: None   Collection Time: 04/19/15 12:00 PM  Result Value Ref Range   Creatinine, Urine 28.00 mg/dL   Total Protein, Urine <6 mg/dL   Protein Creatinine Ratio        0.00 - 0.15 mg/mg[Cre]    Patient Vitals for the past 24 hrs:  BP Temp Temp src Pulse Resp  04/19/15 1151 120/59 mmHg - - 96 20  04/19/15 1141 114/60 mmHg - - 95 -  04/19/15 1131 110/64 mmHg - - 96 -  04/19/15 1121 119/99 mmHg - - 108 -  04/19/15 1113 135/80 mmHg - - 110 18  04/19/15 1109 142/86 mmHg 98.2 F (36.8 C) Oral 111 20   Fetal Monitoring: Baseline: 125 bpm, moderate  variability, + accelerations, no decelerations Contractions: none  MAU Course  Procedures  None  MDM Serial BPs CBC, CMP, Uric Acid, LDH, urine protein/creatinine ratio today Discussed patient with Dr. Philis Pique. Agrees with plan for discharge at this time. Recommends rest over the weekend and short interval follow-up when the patient returns to town.    Assessment and Plan  A: SIUP at 30w4dElevated blood pressure in pregnancy  P: Discharge home Pre-eclampsia precautions discussed Patient advised to follow-up with Dr. CCarlis Abbottnext week as planned or sooner PRN Patient may return to MAU as needed or if her condition were to change or worsen   JLuvenia Redden PA-C  04/19/2015, 1:14 PM

## 2015-04-19 NOTE — Discharge Instructions (Signed)
Preeclampsia and Eclampsia Preeclampsia is a serious condition that develops only during pregnancy. It is also called toxemia of pregnancy. This condition causes high blood pressure along with other symptoms, such as swelling and headaches. These may develop as the condition gets worse. Preeclampsia may occur 20 weeks or later into your pregnancy.  Diagnosing and treating preeclampsia early is very important. If not treated early, it can cause serious problems for you and your baby. One problem it can lead to is eclampsia, which is a condition that causes muscle jerking or shaking (convulsions) in the mother. Delivering your baby is the best treatment for preeclampsia or eclampsia.  RISK FACTORS The cause of preeclampsia is not known. You may be more likely to develop preeclampsia if you have certain risk factors. These include:   Being pregnant for the first time.  Having preeclampsia in a past pregnancy.  Having a family history of preeclampsia.  Having high blood pressure.  Being pregnant with twins or triplets.  Being 25 or older.  Being African American.  Having kidney disease or diabetes.  Having medical conditions such as lupus or blood diseases.  Being very overweight (obese). SIGNS AND SYMPTOMS  The earliest signs of preeclampsia are:  High blood pressure.  Increased protein in your urine. Your health care provider will check for this at every prenatal visit. Other symptoms that can develop include:   Severe headaches.  Sudden weight gain.  Swelling of your hands, face, legs, and feet.  Feeling sick to your stomach (nauseous) and throwing up (vomiting).  Vision problems (blurred or double vision).  Numbness in your face, arms, legs, and feet.  Dizziness.  Slurred speech.  Sensitivity to bright lights.  Abdominal pain. DIAGNOSIS  There are no screening tests for preeclampsia. Your health care provider will ask you about symptoms and check for signs of  preeclampsia during your prenatal visits. You may also have tests, including:  Urine testing.  Blood testing.  Checking your baby's heart rate.  Checking the health of your baby and your placenta using images created with sound waves (ultrasound). TREATMENT  You can work out the best treatment approach together with your health care provider. It is very important to keep all prenatal appointments. If you have an increased risk of preeclampsia, you may need more frequent prenatal exams.  Your health care provider may prescribe bed rest.  You may have to eat as little salt as possible.  You may need to take medicine to lower your blood pressure if the condition does not respond to more conservative measures.  You may need to stay in the hospital if your condition is severe. There, treatment will focus on controlling your blood pressure and fluid retention. You may also need to take medicine to prevent seizures.  If the condition gets worse, your baby may need to be delivered early to protect you and the baby. You may have your labor started with medicine (be induced), or you may have a cesarean delivery.  Preeclampsia usually goes away after the baby is born. HOME CARE INSTRUCTIONS   Only take over-the-counter or prescription medicines as directed by your health care provider.  Lie on your left side while resting. This keeps pressure off your baby.  Elevate your feet while resting.  Get regular exercise. Ask your health care provider what type of exercise is safe for you.  Avoid caffeine and alcohol.  Do not smoke.  Drink 6-8 glasses of water every day.  Eat a balanced diet  that is low in salt. Do not add salt to your food.  Avoid stressful situations as much as possible.  Get plenty of rest and sleep.  Keep all prenatal appointments and tests as scheduled. SEEK MEDICAL CARE IF:  You are gaining more weight than expected.  You have any headaches, abdominal pain, or  nausea.  You are bruising more than usual.  You feel dizzy or light-headed. SEEK IMMEDIATE MEDICAL CARE IF:   You develop sudden or severe swelling anywhere in your body. This usually happens in the legs.  You gain 5 lb (2.3 kg) or more in a week.  You have a severe headache, dizziness, problems with your vision, or confusion.  You have severe abdominal pain.  You have lasting nausea or vomiting.  You have a seizure.  You have trouble moving any part of your body.  You develop numbness in your body.  You have trouble speaking.  You have any abnormal bleeding.  You develop a stiff neck.  You pass out. MAKE SURE YOU:   Understand these instructions.  Will watch your condition.  Will get help right away if you are not doing well or get worse. Document Released: 07/10/2000 Document Revised: 07/18/2013 Document Reviewed: 05/05/2013 [facility] Patient Information 2015 [place], [place]. This information is not intended to replace advice given to you by your health care provider. Make sure you discuss any questions you have with your health care provider.

## 2015-04-19 NOTE — MAU Note (Signed)
Pt d/c'd home with instructions.

## 2015-04-25 ENCOUNTER — Other Ambulatory Visit: Payer: Self-pay | Admitting: Obstetrics and Gynecology

## 2015-05-16 ENCOUNTER — Encounter (HOSPITAL_COMMUNITY): Payer: Self-pay

## 2015-05-17 ENCOUNTER — Other Ambulatory Visit (HOSPITAL_COMMUNITY): Payer: Self-pay | Admitting: Obstetrics and Gynecology

## 2015-05-17 ENCOUNTER — Encounter (HOSPITAL_COMMUNITY): Payer: Self-pay

## 2015-05-17 ENCOUNTER — Encounter (HOSPITAL_COMMUNITY)
Admission: RE | Admit: 2015-05-17 | Discharge: 2015-05-17 | Disposition: A | Discharge status: Home / Self Care | Payer: BC Managed Care – PPO | Source: Ambulatory Visit | Attending: Obstetrics and Gynecology | Admitting: Obstetrics and Gynecology

## 2015-05-17 HISTORY — DX: Cardiac murmur, unspecified: R01.1

## 2015-05-17 HISTORY — DX: Essential (primary) hypertension: I10

## 2015-05-17 HISTORY — DX: Gastro-esophageal reflux disease without esophagitis: K21.9

## 2015-05-17 LAB — CBC
HCT: 37.7 % (ref 36.0–46.0)
Hemoglobin: 12.6 g/dL (ref 12.0–15.0)
MCH: 30.2 pg (ref 26.0–34.0)
MCHC: 33.4 g/dL (ref 30.0–36.0)
MCV: 90.4 fL (ref 78.0–100.0)
PLATELETS: 183 10*3/uL (ref 150–400)
RBC: 4.17 MIL/uL (ref 3.87–5.11)
RDW: 13.6 % (ref 11.5–15.5)
WBC: 8.9 10*3/uL (ref 4.0–10.5)

## 2015-05-17 LAB — TYPE AND SCREEN
ABO/RH(D): B POS
Antibody Screen: NEGATIVE

## 2015-05-17 NOTE — H&P (Signed)
Stacey Singh is a 39 y.o. female presenting for Repeat Cesarean Section  39 yo G2P1001 @ 39+0 presents for a repeat cesarean section. Her pregnancy was conceived by ART. 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 She has had some mildly elevated Blood pressures before and during the pregnancy. She has not required antihyperensive therapy. Her BPs have ranged from 120-130/80s. Over the last few weeks her blood pressure has been increasing. She presents today for a repeat cesarean section. R/B/A reviewed and she wishes to proceed. History OB History    Gravida Para Term Preterm AB TAB SAB Ectopic Multiple Living   5 1 1  3  0 3   1     Past Medical History  Diagnosis Date  . Heartburn in pregnancy   . No pertinent past medical history   . GERD (gastroesophageal reflux disease)   . Hypertension     with current pregnancy  . Heart murmur     in high school, resolved   Past Surgical History  Procedure Laterality Date  . Dilation and curettage of uterus    . Wisdom tooth extraction    . Cesarean section  04/04/2012    Procedure: CESAREAN SECTION;  Surgeon: Farrel Gobble. Harrington Challenger, MD;  Location: [facility];  Service: Obstetrics;  Laterality: N/A;   Family History: family history is not on file. Social History:  reports that she has never smoked. She has never used smokeless tobacco. She reports that she does not drink alcohol or use illicit drugs.   Prenatal Transfer Tool  Maternal Diabetes: No Genetic Screening: Declined Maternal Ultrasounds/Referrals: Declined Fetal Ultrasounds or other Referrals:  None Maternal Substance Abuse:  No Significant Maternal Medications:  None Significant Maternal Lab Results:  None Other Comments:  None  ROS: As above    Last menstrual period 08/20/2014, unknown if currently breastfeeding. Exam Physical Exam  Prenatal labs: ABO, Rh: --/--/B POS (10/21 1215) Antibody: NEG (10/21 1215) Rubella:  Immune RPR: Nonreactive (04/19  0000)  HBsAg: Negative (04/19 0000)  HIV: Non-reactive (04/19 0000)  GBS:   Positive  Assessment/Plan: 1) Admit 2) [facility] 3) Proceed with Repeat C/S    Tovia Kisner H. 05/17/2015, 3:17 PM

## 2015-05-17 NOTE — Patient Instructions (Addendum)
Your procedure is scheduled on:  Monday, May 20, 2015  Enter through the Main Entrance of [facility] at:  1:15 PM  Pick up the phone at the desk and dial [phone].  Call this number if you have problems the morning of surgery: [phone].  Remember: Do NOT eat food: AFTER MIDNIGHT SUNDAY Do NOT drink clear liquids after:  AFTER 10:30 AM MORNING OF SURGERY Take these medicines the morning of surgery with a SIP OF WATER: Zantac  Do NOT wear jewelry (body piercing), metal hair clips/bobby pins, or nail polish. Do NOT wear lotions, powders, or perfumes.  You may wear deoderant. Do NOT shave for 48 hours prior to surgery. Do NOT bring valuables to the hospital. Leave suitcase in car.  After surgery it may be brought to your room.  For patients admitted to the hospital, checkout time is 11:00 AM the day of discharge.

## 2015-05-18 LAB — RPR: RPR: NONREACTIVE

## 2015-05-19 ENCOUNTER — Encounter (HOSPITAL_COMMUNITY)
Admission: AD | Disposition: A | Discharge status: Home / Self Care | Payer: Self-pay | Source: Ambulatory Visit | Attending: Obstetrics and Gynecology

## 2015-05-19 ENCOUNTER — Inpatient Hospital Stay (HOSPITAL_COMMUNITY)
Admission: AD | Admit: 2015-05-19 | Discharge: 2015-05-22 | DRG: 765 | Disposition: A | Discharge status: Home / Self Care | Payer: BC Managed Care – PPO | Source: Ambulatory Visit | Attending: Obstetrics and Gynecology | Admitting: Obstetrics and Gynecology

## 2015-05-19 ENCOUNTER — Inpatient Hospital Stay (HOSPITAL_COMMUNITY): Payer: BC Managed Care – PPO

## 2015-05-19 ENCOUNTER — Encounter (HOSPITAL_COMMUNITY): Payer: Self-pay | Admitting: *Deleted

## 2015-05-19 DIAGNOSIS — O1092 Unspecified pre-existing hypertension complicating childbirth: Secondary | ICD-10-CM | POA: Diagnosis present

## 2015-05-19 DIAGNOSIS — O4593 Premature separation of placenta, unspecified, third trimester: Secondary | ICD-10-CM | POA: Diagnosis present

## 2015-05-19 DIAGNOSIS — O9962 Diseases of the digestive system complicating childbirth: Secondary | ICD-10-CM | POA: Diagnosis present

## 2015-05-19 DIAGNOSIS — O99214 Obesity complicating childbirth: Secondary | ICD-10-CM | POA: Diagnosis present

## 2015-05-19 DIAGNOSIS — O34211 Maternal care for low transverse scar from previous cesarean delivery: Principal | ICD-10-CM | POA: Diagnosis present

## 2015-05-19 DIAGNOSIS — K219 Gastro-esophageal reflux disease without esophagitis: Secondary | ICD-10-CM | POA: Diagnosis present

## 2015-05-19 DIAGNOSIS — Z302 Encounter for sterilization: Secondary | ICD-10-CM | POA: Diagnosis not present

## 2015-05-19 DIAGNOSIS — Z6841 Body Mass Index (BMI) 40.0 and over, adult: Secondary | ICD-10-CM | POA: Diagnosis not present

## 2015-05-19 DIAGNOSIS — Z3A39 39 weeks gestation of pregnancy: Secondary | ICD-10-CM

## 2015-05-19 LAB — CBC
HEMATOCRIT: 37.5 % (ref 36.0–46.0)
Hemoglobin: 12.9 g/dL (ref 12.0–15.0)
MCH: 31.1 pg (ref 26.0–34.0)
MCHC: 34.4 g/dL (ref 30.0–36.0)
MCV: 90.4 fL (ref 78.0–100.0)
PLATELETS: 181 10*3/uL (ref 150–400)
RBC: 4.15 MIL/uL (ref 3.87–5.11)
RDW: 13.6 % (ref 11.5–15.5)
WBC: 9.8 10*3/uL (ref 4.0–10.5)

## 2015-05-19 LAB — PREPARE RBC (CROSSMATCH)

## 2015-05-19 LAB — POCT FERN TEST: POCT FERN TEST: POSITIVE

## 2015-05-19 LAB — RPR: RPR Ser Ql: NONREACTIVE

## 2015-05-19 SURGERY — Surgical Case
Anesthesia: Spinal

## 2015-05-19 MED ORDER — SIMETHICONE 80 MG PO CHEW
80.0000 mg | CHEWABLE_TABLET | Freq: Three times a day (TID) | ORAL | Status: DC
  Administered 2015-05-19 – 2015-05-22 (×8): 80 mg via ORAL
  Filled 2015-05-19 (×8): qty 1

## 2015-05-19 MED ORDER — KETOROLAC TROMETHAMINE 30 MG/ML IJ SOLN: 30.0000 mg | Freq: Four times a day (QID) | INTRAMUSCULAR | Status: DC | PRN

## 2015-05-19 MED ORDER — DIBUCAINE 1 % RE OINT: 1.0000 "application " | TOPICAL_OINTMENT | RECTAL | Status: DC | PRN

## 2015-05-19 MED ORDER — LACTATED RINGERS IV SOLN: INTRAVENOUS | Status: DC

## 2015-05-19 MED ORDER — DEXAMETHASONE SODIUM PHOSPHATE 4 MG/ML IJ SOLN
INTRAMUSCULAR | Status: AC
  Filled 2015-05-19: qty 1

## 2015-05-19 MED ORDER — LACTATED RINGERS IV SOLN
INTRAVENOUS | Status: DC | PRN
  Administered 2015-05-19 (×2): via INTRAVENOUS

## 2015-05-19 MED ORDER — CITRIC ACID-SODIUM CITRATE 334-500 MG/5ML PO SOLN
ORAL | Status: AC
  Filled 2015-05-19: qty 15

## 2015-05-19 MED ORDER — LACTATED RINGERS IV SOLN
INTRAVENOUS | Status: DC
  Administered 2015-05-19: 06:00:00 via INTRAVENOUS

## 2015-05-19 MED ORDER — ACETAMINOPHEN 325 MG PO TABS: 650.0000 mg | ORAL_TABLET | ORAL | Status: DC | PRN

## 2015-05-19 MED ORDER — NALBUPHINE HCL 10 MG/ML IJ SOLN: 5.0000 mg | Freq: Once | INTRAMUSCULAR | Status: DC | PRN

## 2015-05-19 MED ORDER — TETANUS-DIPHTH-ACELL PERTUSSIS 5-2.5-18.5 LF-MCG/0.5 IM SUSP: 0.5000 mL | Freq: Once | INTRAMUSCULAR | Status: DC

## 2015-05-19 MED ORDER — DEXAMETHASONE SODIUM PHOSPHATE 4 MG/ML IJ SOLN
INTRAMUSCULAR | Status: DC | PRN
  Administered 2015-05-19: 4 mg via INTRAVENOUS

## 2015-05-19 MED ORDER — OXYCODONE-ACETAMINOPHEN 5-325 MG PO TABS: 2.0000 | ORAL_TABLET | ORAL | Status: DC | PRN

## 2015-05-19 MED ORDER — NALBUPHINE HCL 10 MG/ML IJ SOLN: 5.0000 mg | INTRAMUSCULAR | Status: DC | PRN

## 2015-05-19 MED ORDER — ONDANSETRON HCL 4 MG/2ML IJ SOLN
INTRAMUSCULAR | Status: DC | PRN
  Administered 2015-05-19: 4 mg via INTRAVENOUS

## 2015-05-19 MED ORDER — LANOLIN HYDROUS EX OINT: 1.0000 "application " | TOPICAL_OINTMENT | CUTANEOUS | Status: DC | PRN

## 2015-05-19 MED ORDER — DIPHENHYDRAMINE HCL 50 MG/ML IJ SOLN: 12.5000 mg | INTRAMUSCULAR | Status: DC | PRN

## 2015-05-19 MED ORDER — IBUPROFEN 600 MG PO TABS
600.0000 mg | ORAL_TABLET | Freq: Four times a day (QID) | ORAL | Status: DC
  Administered 2015-05-19 – 2015-05-22 (×12): 600 mg via ORAL
  Filled 2015-05-19 (×12): qty 1

## 2015-05-19 MED ORDER — DIPHENHYDRAMINE HCL 25 MG PO CAPS: 25.0000 mg | ORAL_CAPSULE | ORAL | Status: DC | PRN

## 2015-05-19 MED ORDER — KETOROLAC TROMETHAMINE 30 MG/ML IJ SOLN
INTRAMUSCULAR | Status: AC
  Administered 2015-05-19: 30 mg via INTRAMUSCULAR
  Filled 2015-05-19: qty 1

## 2015-05-19 MED ORDER — KETOROLAC TROMETHAMINE 30 MG/ML IJ SOLN
30.0000 mg | Freq: Four times a day (QID) | INTRAMUSCULAR | Status: DC | PRN
  Administered 2015-05-19: 30 mg via INTRAMUSCULAR

## 2015-05-19 MED ORDER — OXYCODONE-ACETAMINOPHEN 5-325 MG PO TABS
1.0000 | ORAL_TABLET | ORAL | Status: DC | PRN
  Administered 2015-05-20: 1 via ORAL
  Filled 2015-05-19: qty 1

## 2015-05-19 MED ORDER — ONDANSETRON HCL 4 MG/2ML IJ SOLN
INTRAMUSCULAR | Status: AC
  Filled 2015-05-19: qty 2

## 2015-05-19 MED ORDER — OXYTOCIN 40 UNITS IN LACTATED RINGERS INFUSION - SIMPLE MED: 62.5000 mL/h | INTRAVENOUS | Status: AC

## 2015-05-19 MED ORDER — FENTANYL CITRATE (PF) 100 MCG/2ML IJ SOLN
INTRAMUSCULAR | Status: AC
  Filled 2015-05-19: qty 4

## 2015-05-19 MED ORDER — GENTAMICIN SULFATE 40 MG/ML IJ SOLN
INTRAMUSCULAR | Status: AC
  Administered 2015-05-19: 100 mL via INTRAVENOUS
  Filled 2015-05-19: qty 10.25

## 2015-05-19 MED ORDER — SCOPOLAMINE 1 MG/3DAYS TD PT72
1.0000 | MEDICATED_PATCH | Freq: Once | TRANSDERMAL | Status: DC
  Filled 2015-05-19: qty 1

## 2015-05-19 MED ORDER — LACTATED RINGERS IV SOLN
INTRAVENOUS | Status: DC
  Administered 2015-05-19: 1 mL via INTRAVENOUS

## 2015-05-19 MED ORDER — MORPHINE SULFATE (PF) 0.5 MG/ML IJ SOLN
INTRAMUSCULAR | Status: AC
  Filled 2015-05-19: qty 100

## 2015-05-19 MED ORDER — ONDANSETRON HCL 4 MG/2ML IJ SOLN: 4.0000 mg | Freq: Three times a day (TID) | INTRAMUSCULAR | Status: DC | PRN

## 2015-05-19 MED ORDER — ZOLPIDEM TARTRATE 5 MG PO TABS: 5.0000 mg | ORAL_TABLET | Freq: Every evening | ORAL | Status: DC | PRN

## 2015-05-19 MED ORDER — SCOPOLAMINE 1 MG/3DAYS TD PT72
MEDICATED_PATCH | TRANSDERMAL | Status: AC
  Filled 2015-05-19: qty 1

## 2015-05-19 MED ORDER — DIPHENHYDRAMINE HCL 25 MG PO CAPS
25.0000 mg | ORAL_CAPSULE | Freq: Four times a day (QID) | ORAL | Status: DC | PRN
Start: 2015-05-19 — End: 2015-05-22

## 2015-05-19 MED ORDER — CITRIC ACID-SODIUM CITRATE 334-500 MG/5ML PO SOLN
30.0000 mL | Freq: Once | ORAL | Status: AC
  Administered 2015-05-19: 30 mL via ORAL

## 2015-05-19 MED ORDER — NALOXONE HCL 2 MG/2ML IJ SOSY
1.0000 ug/kg/h | PREFILLED_SYRINGE | INTRAVENOUS | Status: DC | PRN
  Filled 2015-05-19: qty 2

## 2015-05-19 MED ORDER — 0.9 % SODIUM CHLORIDE (POUR BTL) OPTIME
TOPICAL | Status: DC | PRN
  Administered 2015-05-19: 1000 mL

## 2015-05-19 MED ORDER — SCOPOLAMINE 1 MG/3DAYS TD PT72
MEDICATED_PATCH | TRANSDERMAL | Status: DC | PRN
  Administered 2015-05-19: 1 via TRANSDERMAL

## 2015-05-19 MED ORDER — NALOXONE HCL 0.4 MG/ML IJ SOLN: 0.4000 mg | INTRAMUSCULAR | Status: DC | PRN

## 2015-05-19 MED ORDER — OXYTOCIN 10 UNIT/ML IJ SOLN
INTRAMUSCULAR | Status: AC
  Filled 2015-05-19: qty 4

## 2015-05-19 MED ORDER — PHENYLEPHRINE 8 MG IN D5W 100 ML (0.08MG/ML) PREMIX OPTIME
INJECTION | INTRAVENOUS | Status: DC | PRN
  Administered 2015-05-19: 60 ug/min via INTRAVENOUS

## 2015-05-19 MED ORDER — FENTANYL CITRATE (PF) 100 MCG/2ML IJ SOLN: 25.0000 ug | INTRAMUSCULAR | Status: DC | PRN

## 2015-05-19 MED ORDER — SIMETHICONE 80 MG PO CHEW
80.0000 mg | CHEWABLE_TABLET | ORAL | Status: DC
  Administered 2015-05-19 – 2015-05-22 (×3): 80 mg via ORAL
  Filled 2015-05-19 (×3): qty 1

## 2015-05-19 MED ORDER — FAMOTIDINE IN NACL 20-0.9 MG/50ML-% IV SOLN
20.0000 mg | Freq: Once | INTRAVENOUS | Status: AC
Start: 2015-05-19 — End: 2015-05-19
  Administered 2015-05-19: 20 mg via INTRAVENOUS

## 2015-05-19 MED ORDER — MORPHINE SULFATE (PF) 0.5 MG/ML IJ SOLN
INTRAMUSCULAR | Status: DC | PRN
  Administered 2015-05-19: .1 mg via INTRATHECAL

## 2015-05-19 MED ORDER — LACTATED RINGERS IV SOLN
40.0000 [IU] | INTRAVENOUS | Status: DC | PRN
  Administered 2015-05-19: 40 [IU] via INTRAVENOUS

## 2015-05-19 MED ORDER — MENTHOL 3 MG MT LOZG: 1.0000 | LOZENGE | OROMUCOSAL | Status: DC | PRN

## 2015-05-19 MED ORDER — WITCH HAZEL-GLYCERIN EX PADS
1.0000 | MEDICATED_PAD | CUTANEOUS | Status: DC | PRN
Start: 2015-05-19 — End: 2015-05-22

## 2015-05-19 MED ORDER — PHENYLEPHRINE 8 MG IN D5W 100 ML (0.08MG/ML) PREMIX OPTIME
INJECTION | INTRAVENOUS | Status: AC
  Filled 2015-05-19: qty 100

## 2015-05-19 MED ORDER — MEPERIDINE HCL 25 MG/ML IJ SOLN: 6.2500 mg | INTRAMUSCULAR | Status: DC | PRN

## 2015-05-19 MED ORDER — LACTATED RINGERS IV SOLN
INTRAVENOUS | Status: DC | PRN
Start: 2015-05-19 — End: 2015-05-19
  Administered 2015-05-19: 07:00:00 via INTRAVENOUS

## 2015-05-19 MED ORDER — SIMETHICONE 80 MG PO CHEW: 80.0000 mg | CHEWABLE_TABLET | ORAL | Status: DC | PRN

## 2015-05-19 MED ORDER — FENTANYL CITRATE (PF) 100 MCG/2ML IJ SOLN
INTRAMUSCULAR | Status: DC | PRN
  Administered 2015-05-19: 12.5 ug via INTRATHECAL

## 2015-05-19 MED ORDER — SODIUM CHLORIDE 0.9 % IJ SOLN
3.0000 mL | INTRAMUSCULAR | Status: DC | PRN
Start: 2015-05-19 — End: 2015-05-22

## 2015-05-19 MED ORDER — BUPIVACAINE IN DEXTROSE 0.75-8.25 % IT SOLN
INTRATHECAL | Status: DC | PRN
  Administered 2015-05-19: 1.5 mL via INTRATHECAL

## 2015-05-19 MED ORDER — PRENATAL MULTIVITAMIN CH
1.0000 | ORAL_TABLET | Freq: Every day | ORAL | Status: DC
  Administered 2015-05-20 – 2015-05-22 (×3): 1 via ORAL
  Filled 2015-05-19 (×3): qty 1

## 2015-05-19 MED ORDER — FAMOTIDINE IN NACL 20-0.9 MG/50ML-% IV SOLN
INTRAVENOUS | Status: AC
  Filled 2015-05-19: qty 50

## 2015-05-19 MED ORDER — SENNOSIDES-DOCUSATE SODIUM 8.6-50 MG PO TABS
2.0000 | ORAL_TABLET | ORAL | Status: DC
  Administered 2015-05-19 – 2015-05-22 (×3): 2 via ORAL
  Filled 2015-05-19 (×3): qty 2

## 2015-05-19 SURGICAL SUPPLY — 36 items
CLAMP CORD UMBIL (MISCELLANEOUS) IMPLANT
CLIP FILSHIE TUBAL LIGA STRL (Clip) ×3 IMPLANT
CLOTH BEACON ORANGE TIMEOUT ST (SAFETY) ×3 IMPLANT
DRAPE SHEET LG 3/4 BI-LAMINATE (DRAPES) IMPLANT
DRSG OPSITE POSTOP 4X10 (GAUZE/BANDAGES/DRESSINGS) ×3 IMPLANT
DRSG OPSITE POSTOP 4X12 (GAUZE/BANDAGES/DRESSINGS) ×3 IMPLANT
DURAPREP 26ML APPLICATOR (WOUND CARE) ×3 IMPLANT
ELECT REM PT RETURN 9FT ADLT (ELECTROSURGICAL) ×3
ELECTRODE REM PT RTRN 9FT ADLT (ELECTROSURGICAL) ×1 IMPLANT
EXTRACTOR VACUUM M CUP 4 TUBE (SUCTIONS) IMPLANT
EXTRACTOR VACUUM M CUP 4' TUBE (SUCTIONS)
GLOVE BIOGEL PI IND STRL 6.5 (GLOVE) ×1 IMPLANT
GLOVE BIOGEL PI INDICATOR 6.5 (GLOVE) ×2
GLOVE ECLIPSE 6.5 STRL STRAW (GLOVE) ×3 IMPLANT
GOWN STRL REUS W/TWL LRG LVL3 (GOWN DISPOSABLE) ×6 IMPLANT
KIT ABG SYR 3ML LUER SLIP (SYRINGE) IMPLANT
NEEDLE HYPO 25X5/8 SAFETYGLIDE (NEEDLE) IMPLANT
NS IRRIG 1000ML POUR BTL (IV SOLUTION) ×3 IMPLANT
PACK C SECTION WH (CUSTOM PROCEDURE TRAY) ×3 IMPLANT
PAD ABD 7.5X8 STRL (GAUZE/BANDAGES/DRESSINGS) IMPLANT
PAD OB MATERNITY 4.3X12.25 (PERSONAL CARE ITEMS) ×3 IMPLANT
PENCIL SMOKE EVAC W/HOLSTER (ELECTROSURGICAL) ×3 IMPLANT
RETAINER VISCERAL (MISCELLANEOUS) ×3 IMPLANT
RETRACTOR TRAXI PANNICULUS (MISCELLANEOUS) ×1 IMPLANT
RTRCTR C-SECT PINK 25CM LRG (MISCELLANEOUS) ×3 IMPLANT
STAPLER VISISTAT 35W (STAPLE) ×3 IMPLANT
SUT MON AB 2-0 CT1 27 (SUTURE) ×3 IMPLANT
SUT MON AB 4-0 PS1 27 (SUTURE) IMPLANT
SUT PDS AB 0 CTX 60 (SUTURE) ×3 IMPLANT
SUT PLAIN 2 0 XLH (SUTURE) ×3 IMPLANT
SUT VIC AB 0 CTX 36 (SUTURE) ×8
SUT VIC AB 0 CTX36XBRD ANBCTRL (SUTURE) ×4 IMPLANT
SUT VIC AB 4-0 KS 27 (SUTURE) IMPLANT
TOWEL OR 17X24 6PK STRL BLUE (TOWEL DISPOSABLE) ×3 IMPLANT
TRAXI PANNICULUS RETRACTOR (MISCELLANEOUS) ×2
TRAY FOLEY CATH SILVER 14FR (SET/KITS/TRAYS/PACK) ×3 IMPLANT

## 2015-05-19 NOTE — Progress Notes (Signed)
Patient ID: Stacey Singh, female   DOB: [birth year], 39 y.o.   MRN: [id number]  Addendum to MAU note:   Fetal Monitoring: Baseline: 150 bpm, minimal variability, few 10 x 10 accelerations, no decelarations Contractions: occasional   Luvenia Redden, PA-C 05/19/2015 7:10 AM

## 2015-05-19 NOTE — H&P (Signed)
There has been no change in the patients history, status since the history and physical.  She woke up with bleeding and a gush of fluid at 3:30 am today.  Filed Vitals:   05/19/15 0507  BP: 123/82  Pulse: 104  Resp: 20    Lab Results  Component Value Date   WBC 8.9 05/17/2015   HGB 12.6 05/17/2015   HCT 37.7 05/17/2015   MCV 90.4 05/17/2015   PLT 183 05/17/2015   No other changes to H&P by Dr. Mignon Pine.  Will release C/s ordered and proceed with repeat c/s.  Allyn Kenner

## 2015-05-19 NOTE — Transfer of Care (Signed)
Immediate Anesthesia Transfer of Care Note  Patient: Stacey Singh  Procedure(s) Performed: Procedure(s): CESAREAN SECTION (N/A)  Patient Location: PACU  Anesthesia Type:Spinal  Level of Consciousness: awake and alert   Airway & Oxygen Therapy: Patient Spontanous Breathing  Post-op Assessment: Report given to RN and Post -op Vital signs reviewed and stable  Post vital signs: Reviewed and stable  Last Vitals:  Filed Vitals:   05/19/15 0606  BP: 121/70  Pulse: 105  Temp:   Resp:     Complications: No apparent anesthesia complications

## 2015-05-19 NOTE — Brief Op Note (Signed)
05/19/2015  7:56 AM  PATIENT:  Stacey Singh  39 y.o. female  PRE-OPERATIVE DIAGNOSIS:  Repeat cesarean section, rupture of membranes, desires sterilization  POST-OPERATIVE DIAGNOSIS:  Repeat cesarean section, rupture of membranes, desires sterilization, placental abruption  PROCEDURE:  Procedure(s): CESAREAN SECTION (N/A)  SURGEON:  Surgeon(s) and Role:    * Allyn Kenner, DO - Primary    * Gwynne Edinger, MD - Fellow  ANESTHESIA:   spinal  EBL:  Total I/O In: 1000 [I.V.:1000] Out: 600 [Urine:100; Blood:500]  BLOOD ADMINISTERED:none  SPECIMEN:  Source of Specimen:  placenta  DISPOSITION OF SPECIMEN:  PATHOLOGY  COUNTS:  YES  PLAN OF CARE: Admit to inpatient   PATIENT DISPOSITION:  PACU - hemodynamically stable.   Delay start of Pharmacological VTE agent (>24hrs) due to surgical blood loss or risk of bleeding: not applicable

## 2015-05-19 NOTE — Progress Notes (Signed)
Spec exam done and fern slide made.

## 2015-05-19 NOTE — MAU Note (Signed)
About 0330 had gush of fld with a lot of blood. No abd pain but just feels tightening of abdomen. Has not felt baby move tonight but that is not unusual.

## 2015-05-19 NOTE — Progress Notes (Signed)
[facility]  Delivery Note:  C-section       05/19/2015  7:14 AM  I was called to the operating room at the request of the patient's obstetrician (Dr. Rogue Bussing) for a repeat c-section.  PRENATAL HX:  39 y/o G5P1031 at 17 and 6/[redacted] weeks gestation.  This pregnancy has been complicated by AMA and hypertension.  She was scheduled for a repeat c-section tomorrow, however her water broke at 0330 this morning, so c-section is being performed now.    INTRAPARTUM HX:   Repeat c-section with SROM at 0330 this morning.  Fluid was bloody and there was a partial abruption at the time of delivery.    DELIVERY:  Infant was vigorous at delivery, requiring no resuscitation other than standard warming, drying and stimulation.  APGARs 8 and 9.  Exam within normal limits.  After 5 minutes, baby left with nurse to assist parents with skin-to-skin care.   _____________________ Electronically Signed By: Clinton Gallant, MD Neonatologist

## 2015-05-19 NOTE — Progress Notes (Signed)
Up to Br

## 2015-05-19 NOTE — Anesthesia Procedure Notes (Signed)
Spinal Patient location during procedure: OR Staffing Anesthesiologist: Curlie Macken Performed by: anesthesiologist  Preanesthetic Checklist Completed: patient identified, site marked, surgical consent, pre-op evaluation, timeout performed, IV checked, risks and benefits discussed and monitors and equipment checked Spinal Block Patient position: sitting Prep: ChloraPrep Patient monitoring: heart rate, continuous pulse ox and blood pressure Approach: right paramedian Location: L4-5 Injection technique: single-shot Needle Needle type: Sprotte  Needle gauge: 24 G Needle length: 9 cm Additional Notes Expiration date of kit checked and confirmed. Patient tolerated procedure well, without complications.     

## 2015-05-19 NOTE — Lactation Note (Signed)
This note was copied from the chart of Stacey Singh. Lactation Consultation Note  [name] is 4 hours of life and has breast fed a few times. This LC observed her having difficulty maintaining the latch.  She was re-positioned in a football hold and latched. Breast stimulation was necessary for her to maintain suckling and swallowing.  Discussed positioning with mom.  Hand expression taught though no colostrum expressed,  Visitors waiting in the hall.  Mom to continue working on BF independently for now.  Information on support groups and outpatient services given to mother.  Patient Name: Stacey Leaner Morici EHUDJ'S Date: 05/19/2015 Reason for consult: Initial assessment   Maternal Data Has patient been taught Hand Expression?: Yes Does the patient have breastfeeding experience prior to this delivery?: Yes  Feeding Feeding Type: Breast Fed Length of feed: 10 min  LATCH Score/Interventions Latch: Repeated attempts needed to sustain latch, nipple held in mouth throughout feeding, stimulation needed to elicit sucking reflex. Intervention(s): Adjust position;Assist with latch  Audible Swallowing: A few with stimulation Intervention(s): Skin to skin  Type of Nipple: Everted at rest and after stimulation  Comfort (Breast/Nipple): Soft / non-tender     Hold (Positioning): Assistance needed to correctly position infant at breast and maintain latch.  LATCH Score: 7  Lactation Tools Discussed/Used     Consult Status Consult Status: Follow-up Date: 05/20/15 Follow-up type: In-patient    Van Clines 05/19/2015, 11:57 AM

## 2015-05-19 NOTE — MAU Provider Note (Signed)
History     CSN: [id number]  Arrival date and time: 05/19/15 9211   First Provider Initiated Contact with Patient 05/19/15 0502      Chief Complaint  Patient presents with  . Rupture of Membranes  . Vaginal Bleeding   HPI Ms. Stacey Singh is a 39 y.o. [id number] at 41w6dwho presents to MAU today with complaint of LOF and vaginal bleeding since 0330 today. She states that she had a large gush of fluid and there was a large amount of thin blood as well. She states occasional tightness in the lower abdomen, but is unsure about true contractions. She denies pain now. She has had slightly elevated blood pressures at her visits recently. She had a non-reactive NST on Friday followed by a normal BPP per patient. She states that they mentioned that her "fluid was a little low." She had a C/S with her previous pregnancy and has a repeat planned for Monday.   OB History    Gravida Para Term Preterm AB TAB SAB Ectopic Multiple Living   5 1 1  3  0 3   1      Past Medical History  Diagnosis Date  . Heartburn in pregnancy   . No pertinent past medical history   . GERD (gastroesophageal reflux disease)   . Hypertension     with current pregnancy  . Heart murmur     in high school, resolved    Past Surgical History  Procedure Laterality Date  . Dilation and curettage of uterus    . Wisdom tooth extraction    . Cesarean section  04/04/2012    Procedure: CESAREAN SECTION;  Surgeon: KFarrel Gobble RHarrington Challenger MD;  [facility];  Service: Obstetrics;  Laterality: N/A;    No family history on file.  Social History  Substance Use Topics  . Smoking status: Never Smoker   . Smokeless tobacco: Never Used  . Alcohol Use: No    Allergies:  Allergies  Allergen Reactions  . Penicillins     Childhood reaction    Prescriptions prior to admission  Medication Sig Dispense Refill Last Dose  . Prenatal Vit-Fe Fumarate-FA (PRENATAL MULTIVITAMIN) TABS Take 1 tablet by mouth daily.   04/18/2015 at  Unknown time  . ranitidine (ZANTAC) 150 MG tablet Take 150 mg by mouth daily.       Review of Systems  Gastrointestinal: Negative for abdominal pain.  Genitourinary:       + vaginal bleeding, LOF   Physical Exam   Last menstrual period 08/20/2014, unknown if currently breastfeeding.  Physical Exam  Nursing note and vitals reviewed. Constitutional: She is oriented to person, place, and time. She appears well-developed and well-nourished. No distress.  HENT:  Head: Normocephalic and atraumatic.  Cardiovascular: Normal rate.   Respiratory: Effort normal.  GI: Soft. She exhibits no distension and no mass. There is no tenderness. There is no rebound and no guarding.  Genitourinary:  + Pooling with small blood  Neurological: She is alert and oriented to person, place, and time.  Skin: Skin is warm and dry. No erythema.  Psychiatric: She has a normal mood and affect.   Results for orders placed or performed during the hospital encounter of 05/19/15 (from the past 24 hour(s))  Fern Test     Status: Normal   Collection Time: 05/19/15  5:24 AM  Result Value Ref Range   POCT Fern Test Positive = ruptured amniotic membanes     MAU Course  Procedures  None  MDM Prenatal records reviewed. Anterior placenta without previa noted.  + Fern Patient last PO intake at 10:00 pm last night (water) and 7:00 pm (food) Discussed with Dr. Rogue Bussing. She will call the OR to set up for C/S this morning. Asked that I release orders placed by Dr Harrington Challenger for C/S tomorrow. Orders released and RN notified.   Assessment and Plan  A: SIUP at 34w6dPrevious C/S SROM  P: Patient to OR with Dr. COtelia Santee PA-C  05/19/2015, 5:02 AM

## 2015-05-19 NOTE — Anesthesia Preprocedure Evaluation (Signed)
Anesthesia Evaluation  Patient identified by MRN, date of birth, ID band Patient awake    Reviewed: Allergy & Precautions, NPO status , Patient's Chart, lab work & pertinent test results  Airway Mallampati: II  TM Distance: >3 FB Neck ROM: Full    Dental no notable dental hx.    Pulmonary neg pulmonary ROS,    Pulmonary exam normal breath sounds clear to auscultation       Cardiovascular hypertension, Normal cardiovascular exam Rhythm:Regular Rate:Normal     Neuro/Psych negative neurological ROS  negative psych ROS   GI/Hepatic negative GI ROS, Neg liver ROS,   Endo/Other  Morbid obesity  Renal/GU negative Renal ROS  negative genitourinary   Musculoskeletal negative musculoskeletal ROS (+)   Abdominal   Peds negative pediatric ROS (+)  Hematology negative hematology ROS (+)   Anesthesia Other Findings   Reproductive/Obstetrics (+) Pregnancy                             Anesthesia Physical Anesthesia Plan  ASA: II and emergent  Anesthesia Plan: Spinal   Post-op Pain Management:    Induction:   Airway Management Planned: Natural Airway  Additional Equipment:   Intra-op Plan:   Post-operative Plan:   Informed Consent: I have reviewed the patients History and Physical, chart, labs and discussed the procedure including the risks, benefits and alternatives for the proposed anesthesia with the patient or authorized representative who has indicated his/her understanding and acceptance.   Dental advisory given  Plan Discussed with: CRNA  Anesthesia Plan Comments:         Anesthesia Quick Evaluation

## 2015-05-19 NOTE — Anesthesia Postprocedure Evaluation (Signed)
  Anesthesia Post-op Note  Patient: Stacey Singh  Procedure(s) Performed: Procedure(s): CESAREAN SECTION (N/A)  Patient Location: PACU  Anesthesia Type:Spinal  Level of Consciousness: awake  Airway and Oxygen Therapy: Patient Spontanous Breathing  Post-op Pain: none  Post-op Assessment: Post-op Vital signs reviewed, Patient's Cardiovascular Status Stable, Respiratory Function Stable, Patent Airway, No signs of Nausea or vomiting and Pain level controlled              Post-op Vital Signs: Reviewed and stable  Last Vitals:  Filed Vitals:   05/19/15 1135  BP: 127/71  Pulse: 95  Temp: 36.8 C  Resp: 18    Complications: No apparent anesthesia complications

## 2015-05-20 ENCOUNTER — Encounter (HOSPITAL_COMMUNITY): Payer: Self-pay | Admitting: Obstetrics and Gynecology

## 2015-05-20 ENCOUNTER — Inpatient Hospital Stay (HOSPITAL_COMMUNITY)
Admission: RE | Admit: 2015-05-20 | Payer: BC Managed Care – PPO | Source: Ambulatory Visit | Admitting: Obstetrics and Gynecology

## 2015-05-20 ENCOUNTER — Inpatient Hospital Stay (HOSPITAL_COMMUNITY)
Admission: AD | Disposition: A | Discharge status: Home / Self Care | Payer: Self-pay | Source: Ambulatory Visit | Attending: Obstetrics and Gynecology

## 2015-05-20 LAB — CBC
HEMATOCRIT: 32 % — AB (ref 36.0–46.0)
Hemoglobin: 10.7 g/dL — ABNORMAL LOW (ref 12.0–15.0)
MCH: 30.8 pg (ref 26.0–34.0)
MCHC: 33.4 g/dL (ref 30.0–36.0)
MCV: 92.2 fL (ref 78.0–100.0)
PLATELETS: 179 10*3/uL (ref 150–400)
RBC: 3.47 MIL/uL — ABNORMAL LOW (ref 3.87–5.11)
RDW: 13.9 % (ref 11.5–15.5)
WBC: 10 10*3/uL (ref 4.0–10.5)

## 2015-05-20 SURGERY — Surgical Case
Anesthesia: Regional

## 2015-05-20 NOTE — Lactation Note (Signed)
This note was copied from the chart of Stacey Singh. Lactation Consultation Note  Patient Name: Stacey Singh [id number] Date: 05/20/2015 Reason for consult: Follow-up assessment    With this mom of a NICU baby, full term, abruption, stooling blood after first meconium stool. Baby now stable, NPO, on IV fluid. Mom has a 39 year old who she breast fed for 1 year. Mom has begun pumping and is expressing a fes ml's of colostrum, now 26 hours post partum. Mom has a crack around her left nipple. I decreased her to 21 flanges with a good fit, and placed some vaseline at the point of friction in the flange, and mom states this feels comfortable. Dad to bring in some olive oil for mom to apply to her nipple, with pumping, and she is already applying EBM after  Pumping. Premie setting recommended for first 2 days, and then 15-30 minutes once milk transitions in. Mom knows to call for questions/concerns.    Maternal Data    Feeding    LATCH Score/Interventions                      Lactation Tools Discussed/Used Pump Review: Setup, frequency, and cleaning;Milk Storage;Other (comment) (Premie setting, NICU book review, hand expression) Initiated by:: bedside rn Date initiated:: 05/19/15   Consult Status Consult Status: Follow-up Date: 05/21/15 Follow-up type: In-patient    Tonna Corner 05/20/2015, 12:17 PM

## 2015-05-20 NOTE — Progress Notes (Signed)
Subjective: Postpartum Day 1: Cesarean Delivery Patient reports pain controlled, no nausea or vomiting, ambulating without difficulty. Voiding & flatus  Objective: Vital signs in last 24 hours: Temp:  [97.8 F (36.6 C)-99.5 F (37.5 C)] 97.8 F (36.6 C) (10/24 0340) Pulse Rate:  [78-101] 78 (10/24 0340) Resp:  [18-20] 20 (10/24 0340) BP: (114-148)/(60-83) 127/67 mmHg (10/24 0340) SpO2:  [95 %-98 %] 98 % (10/24 0340)  Physical Exam:  General: alert, cooperative and appears stated age 39: appropriate Uterine Fundus: firm Incision: healing well DVT Evaluation: No evidence of DVT seen on physical exam.   Recent Labs  05/19/15 0540 05/20/15 0530  HGB 12.9 10.7*  HCT 37.5 32.0*    Assessment/Plan: Status post Cesarean section. Doing well postoperatively.  Continue current care. Baby in NICU for w/u of blood in her stool, otherwise doing well  Kerman Pfost H. 05/20/2015, 9:43 AM

## 2015-05-21 MED ORDER — FAMOTIDINE 20 MG PO TABS
20.0000 mg | ORAL_TABLET | Freq: Two times a day (BID) | ORAL | Status: DC
  Administered 2015-05-21 – 2015-05-22 (×2): 20 mg via ORAL
  Filled 2015-05-21 (×2): qty 1

## 2015-05-21 MED ORDER — MEASLES, MUMPS & RUBELLA VAC ~~LOC~~ INJ
0.5000 mL | INJECTION | Freq: Once | SUBCUTANEOUS | Status: AC
Start: 2015-05-21 — End: 2015-05-22
  Administered 2015-05-22: 0.5 mL via SUBCUTANEOUS
  Filled 2015-05-21: qty 0.5

## 2015-05-21 NOTE — Lactation Note (Signed)
This note was copied from the chart of Stacey Lessly Broder. Lactation Consultation Note     Follow up with this mom and term baby, now 32 hours post partum. The baby was transferred from NICU to mom's room, a few hours ago. I observed the baby latch easily in cradle and then cross cradle hold, with strong suckles and god breast movement.  Mom feels pulls and tugs at the beginning, then latch is comfortable. The baby is needing lots of stimulation to maintain sucking. I told mom [name] is probably so happy to be back with her, that she gets too comfortable. I advised mom to keep a good feeding diary, and to feed  On cues. If [name] seems hungry after breastfeeding, she could pup and supplement  With her EBM. Mom knows to call for questions/concerns.   Patient Name: Stacey Singh [id number] Date: 05/21/2015   Maternal Data    Feeding Feeding Type: Breast Fed Length of feed: 3 min  LATCH Score/Interventions Latch: Grasps breast easily, tongue down, lips flanged, rhythmical sucking.  Audible Swallowing: A few with stimulation Intervention(s): Skin to skin;Hand expression Intervention(s): Hand expression  Type of Nipple: Everted at rest and after stimulation  Comfort (Breast/Nipple): Soft / non-tender     Hold (Positioning): Assistance needed to correctly position infant at breast and maintain latch. Intervention(s): Breastfeeding basics reviewed;Support Pillows;Position options;Skin to skin  LATCH Score: 8  Lactation Tools Discussed/Used     Consult Status Consult Status: Follow-up Date: 05/22/15 Follow-up type: In-patient    Tonna Corner 05/21/2015, 5:22 PM

## 2015-05-21 NOTE — Progress Notes (Signed)
CSW acknowledges NICU admission.    Patient screened out for psychosocial assessment since none of the following apply:  Psychosocial stressors documented in mother or baby's chart  Gestation less than 32 weeks  Code at delivery   Critically ill infant  Infant with anomalies  Please contact the Clinical Social Worker if specific needs arise, or by MOB's request.   Report from NNP is that baby will be transferred back to CN today.

## 2015-05-21 NOTE — Progress Notes (Signed)
  Patient is eating, ambulating, voiding.  Pain control is good.  Filed Vitals:   05/20/15 0340 05/20/15 0920 05/20/15 1300 05/20/15 1950  BP: 127/67 122/57 111/62 113/54  Pulse: 78 82 87 84  Temp: 97.8 F (36.6 C) 98.6 F (37 C) 98.3 F (36.8 C) 98.2 F (36.8 C)  TempSrc: Oral Oral Oral Oral  Resp: 20 22 20 18   Height:      Weight:      SpO2: 98%       lungs:   clear to auscultation cor:    RRR Abdomen:  soft, appropriate tenderness, incisions intact and without erythema or exudate ex:    no cords   Lab Results  Component Value Date   WBC 10.0 05/20/2015   HGB 10.7* 05/20/2015   HCT 32.0* 05/20/2015   MCV 92.2 05/20/2015   PLT 179 05/20/2015    --/--/B POS (10/23 0540)/R equiv  A/P    Post operative day 2.  Routine post op and postpartum care.  Expect d/c routine- baby in NICU for blodd in stool.  Percocet for pain control.

## 2015-05-21 NOTE — Lactation Note (Signed)
This note was copied from the chart of Stacey Jordanna Diep. Lactation Consultation Note   With this NICU baby, now just over 28 days old, full term, and being transferred back to CNS later today. Mom was able to easily latch the baby, deeply, with some suckles, but she was sleepy at this time. Mom's milk is transitioning in, but is not to the volume the baby has been receiving by bottle. I told mom the baby may want to cluster feed , now that she is backM om also is aware that she can call lactation once home, and also come in for an o/p consult , as needed.   Patient Name: Stacey Singh'T Date: 05/21/2015     Maternal Data    Feeding Feeding Type: Breast Fed Length of feed: 10 min  LATCH Score/Interventions                      Lactation Tools Discussed/Used     Consult Status      Tonna Corner 05/21/2015, 3:36 PM

## 2015-05-22 NOTE — Lactation Note (Signed)
This note was copied from the chart of Stacey Singh. Lactation Consultation Note  Patient Name: Stacey Singh KGYJE'H Date: 05/22/2015 Reason for consult: Follow-up assessment Baby 54 hours old. Mom reports that baby came back to room last night from NICU and has been sleepy at breast off-and-on. Baby latched deeply, suckling rhythmically with a few swallows noted. Enc mom to support baby's head while nursing and demonstrated how to flange baby's lower lip. Mom has 2 small bruised areas around left nipple. Discussed methods of maintaining a deep latch. Discussed with mom that if nipple pain continues after her milk comes to volume, to discuss baby's oral anatomy with pediatrician and/or call for [facility] assistance. Mom aware of OP/BFSG and [place] phone line assistance after D/C. Mom given comfort gels with instructions.   Mom states that she had a good supply with first baby, who she nursed for 1 year. Plan is for mom to put baby to breast first, with cues, and then supplement baby with EBM/formula according to guidelines. Enc mom to post-pump after each feeding as mom has not been pumping enough while separated from baby while baby in NICU. Mom has personal pump at home. Enc mom to nurse on one side until baby pulls off, and to gradually have baby exclusively as mom sure baby transferring breast milk well. Referred parents to Baby and Me booklet for number of diapers to expect by day of life and EBM storage guidelines. Discussed returning to work and nursing/pumping.   Maternal Data    Feeding Feeding Type: Breast Fed Nipple Type:  (error) Length of feed:  (LC assessed first 30 minutes of BF.)  LATCH Score/Interventions Latch: Repeated attempts needed to sustain latch, nipple held in mouth throughout feeding, stimulation needed to elicit sucking reflex. Intervention(s): Adjust position;Assist with latch;Breast compression  Audible Swallowing: A few with stimulation Intervention(s): Skin to  skin;Hand expression Intervention(s): Skin to skin;Hand expression  Type of Nipple: Everted at rest and after stimulation  Comfort (Breast/Nipple): Soft / non-tender     Hold (Positioning): Assistance needed to correctly position infant at breast and maintain latch. Intervention(s): Breastfeeding basics reviewed;Support Pillows;Skin to skin  LATCH Score: 7  Lactation Tools Discussed/Used Tools: Pump;Comfort gels Breast pump type: Double-Electric Breast Pump   Consult Status Consult Status: Complete    Constancia Geeting 05/22/2015, 10:04 AM

## 2015-05-22 NOTE — Discharge Summary (Signed)
Obstetric Discharge Summary Reason for Admission: cesarean section and rupture of membranes Prenatal Procedures: ultrasound Intrapartum Procedures: cesarean: low cervical, transverse Postpartum Procedures: none Complications-Operative and Postpartum: none HEMOGLOBIN  Date Value Ref Range Status  05/20/2015 10.7* 12.0 - 15.0 g/dL Final   HCT  Date Value Ref Range Status  05/20/2015 32.0* 36.0 - 46.0 % Final    Physical Exam:  General: alert and cooperative Lochia: appropriate Uterine Fundus: firm Incision: healing well, no significant drainage, no dehiscence, no significant erythema DVT Evaluation: No evidence of DVT seen on physical exam.  Discharge Diagnoses: Term Pregnancy-delivered  Discharge Information: Date: 05/22/2015 Activity: pelvic rest Diet: routine Medications: PNV and Ibuprofen Condition: improved Instructions: refer to practice specific booklet Discharge to: home Follow-up Information    Follow up with Kasir Hallenbeck, DO In 2 weeks.   Specialty:  Obstetrics and Gynecology   Contact information:   [address] [place] [address] [phone]       Newborn Data: Live born female  Birth Weight: 8 lb 15.7 oz (4075 g) APGAR: 8, 9  Home with mother.  Allyn Kenner 05/22/2015, 9:36 AM

## 2015-05-22 NOTE — Op Note (Signed)
Stacey Singh, Stacey Singh               ACCOUNT NO.:  [id number]  MEDICAL RECORD NO.:  [id number]  LOCATION:  9103                          FACILITY:  [facility]  PHYSICIAN:  Allyn Kenner, DO    DATE OF BIRTH:  1976/04/23  DATE OF PROCEDURE: DATE OF DISCHARGE:                              OPERATIVE REPORT   PREOPERATIVE DIAGNOSES:  Repeat cesarean section, rupture of membranes, desires permanent sterilization.  POSTOPERATIVE DIAGNOSES:  Repeat cesarean section, rupture of membranes, desires permanent sterilization.  PROCEDURE:  Low-transverse cesarean section with bilateral tubal ligation.  SURGEON:  Allyn Kenner, D.O.  ASSISTANT:  Kerry Fort MD  ANESTHESIA:  Spinal.  IV FLUIDS:  1000 mL.  URINE OUTPUT:  100 mL.  ESTIMATED BLOOD LOSS:  500 mL.  SPECIMENS:  Placenta and cord blood.  COMPLICATIONS:  None.  FINDINGS:  Female infant in cephalic presentation with Apgars of 8 and 9.  Weighing 8 pounds 15 ounces.  Some vesicouterine scar tissue tracking bladder to the lower uterine segment.  COMPLICATIONS:  None.  CONDITION:  Stable to PACU.  DESCRIPTION OF PROCEDURE:  The patient was taken to the operating room, where spinal anesthesia was administered and found to be adequate.  She was prepped and draped in the normal sterile fashion in dorsal supine position.  A Traxi pannus elevator was placed prior to prep of the abdomen.  After the patient was prepped and draped.  A Pfannenstiel skin incision was made with a scalpel and carried down to the underlying layer of fascia with Bovie cautery.  The fascia was incised in midline and extended laterally with Mayo scissors.  Kocher clamps were placed at the superior aspect of the fascial incision.  Rectus muscles were dissected off bluntly and sharply.  Kocher clamps were then placed at the inferior aspect of the fascial incision, and again rectus muscles were dissected off bluntly and sharply.  The rectus muscles were separated  in midline and hemostats were used to tent the peritoneum with good visualization.  It was entered sharply and the peritoneal incision was extended by manual traction laterally.  The abdomen was manually surveyed and minimal scar tissue was found.  There was new finding.  An Alexis self retractor was placed.  The vesicouterine peritoneum was dissected off the lower uterine segment with the use of Metzenbaum scissors.  A low transverse cesarean incision was made with a scalpel and the amniotic sac was entered bluntly.  Of note, the appearance of Old blood clot found extruding from the amniotic sac along with amniotic fluid.  The infant's head was elevated and delivered without difficulty.  The cord was clamped and cut after remaining infant's body was delivered.  The infant was handed off to awaiting neonatology.  Cord blood was collected and placenta was sent for pathology.  External massage of the uterus and gentle traction on the umbilical cord facilitate delivery of placenta.  The uterine cavity was cleared of all clots and debris.  The uterine incision were reapproximated and closed with Vicryl in a  running fashion.  An additional figure-of-eight was placed in the left side for excellent hemostasis.  The left fallopian tube was identified and elevated with Kary Kos  clamp.  A Filshie clip was placed with good visualization of placement completely surrounding the tube.  The same procedure was performed on the right side.  The incision was re-examined and found to be hemostatic.  The Alexis self retractor was removed and the peritoneum was closed with 4-0 Monocryl in a running fashion.  Fascia was then reapproximated and closed with loop PDS in a running fashion.  The subcutaneous tissue was irrigated and dried minimally.  Bovie cautery was needed. Chromic was used to reapproximate the subcutaneous tissue with 5 interrupted sutures.  Skin was then reapproximated and closed with staples.   The patient tolerated the procedure well.  Sponge, lap, and needle counts were correct x2.  The patient was taken to recovery in stable condition.          ______________________________ Allyn Kenner, DO     [place]/MEDQ  D:  05/21/2015  T:  05/22/2015  Job:  [id number]

## 2015-05-23 LAB — TYPE AND SCREEN
ABO/RH(D): B POS
ANTIBODY SCREEN: NEGATIVE
UNIT DIVISION: 0
Unit division: 0

## 2015-05-24 ENCOUNTER — Other Ambulatory Visit (HOSPITAL_COMMUNITY): Payer: BC Managed Care – PPO

## 2015-06-07 ENCOUNTER — Ambulatory Visit (HOSPITAL_COMMUNITY): Payer: BC Managed Care – PPO

## 2015-06-10 ENCOUNTER — Other Ambulatory Visit: Payer: Self-pay | Admitting: Physician Assistant

## 2015-06-10 DIAGNOSIS — I10 Essential (primary) hypertension: Secondary | ICD-10-CM

## 2015-06-10 MED ORDER — LABETALOL HCL 100 MG PO TABS
100.0000 mg | ORAL_TABLET | Freq: Two times a day (BID) | ORAL | Status: DC
Start: 2015-06-10 — End: 2015-07-01

## 2015-06-10 NOTE — Progress Notes (Signed)
Pt has a new diagnosis of HTN related to pregnancy.  She has been monitoring her BP through the [facility] infant program and with her OB - regular BP readings of 150/100s.  Did not start medication during pregnancy but is slightly higher since delivery.  Family history of BP (both siblings have HTN that is being treated).  She is current breastfeeding.

## 2015-06-13 ENCOUNTER — Encounter: Payer: Self-pay | Admitting: Physician Assistant

## 2015-06-13 ENCOUNTER — Ambulatory Visit (INDEPENDENT_AMBULATORY_CARE_PROVIDER_SITE_OTHER): Payer: BC Managed Care – PPO | Admitting: Physician Assistant

## 2015-06-13 DIAGNOSIS — I10 Essential (primary) hypertension: Secondary | ICD-10-CM

## 2015-06-13 LAB — COMPLETE METABOLIC PANEL WITH GFR
ALT: 28 U/L (ref 6–29)
AST: 23 U/L (ref 10–30)
Albumin: 4 g/dL (ref 3.6–5.1)
Alkaline Phosphatase: 79 U/L (ref 33–115)
BILIRUBIN TOTAL: 0.3 mg/dL (ref 0.2–1.2)
BUN: 9 mg/dL (ref 7–25)
CHLORIDE: 106 mmol/L (ref 98–110)
CO2: 25 mmol/L (ref 20–31)
CREATININE: 0.56 mg/dL (ref 0.50–1.10)
Calcium: 9 mg/dL (ref 8.6–10.2)
GFR, Est Non African American: >89 mL/min (ref >=60)
GLUCOSE: 94 mg/dL (ref 65–99)
Potassium: 4.2 mmol/L (ref 3.5–5.3)
Sodium: 142 mmol/L (ref 135–146)
TOTAL PROTEIN: 6.5 g/dL (ref 6.1–8.1)

## 2015-06-13 LAB — TSH: TSH: 1.223 u[IU]/mL (ref 0.350–4.500)

## 2015-06-14 NOTE — Progress Notes (Signed)
Stacey Singh  MRN: [id number] DOB: 1975-10-28  Subjective:  Pt presents to clinic for BP check.  She was started on Labetalol 3 days ago and she has been taking without a problem.  She has a new BP cuff at home but has not opened it.  She is having no HA, CP or dizziness.  She is 3 wks post partum and she is having trouble with her infant gaining weight and this has been really stressful.  She had pre-eclampsia with her pregnancy.  She has significant family history of HTN (both siblings are on medications at a age younger than her current age) She has recently become worried that she does not feel like she is attaching to this baby like she did her 1st child (he is 43 y/o).   She is tearful and overwhelmed and stressed because of the baby and her weight issues.  She really wants to breast feed but is having trouble with baby weight gain and that is stressing her out.  She has good support from husband and friends.  She has never had depression is the past.  She is not sleeping well 2nd to feeding the baby.  Patient Active Problem List   Diagnosis Date Noted  . Benign essential HTN 06/13/2015    Current Outpatient Prescriptions on File Prior to Visit  Medication Sig Dispense Refill  . labetalol (NORMODYNE) 100 MG tablet Take 1 tablet (100 mg total) by mouth 2 (two) times daily. 60 tablet 0  . Prenatal Vit-Fe Fumarate-FA (PRENATAL MULTIVITAMIN) TABS Take 1 tablet by mouth daily.    . ranitidine (ZANTAC) 150 MG tablet Take 150 mg by mouth daily.     No current facility-administered medications on file prior to visit.    Allergies  Allergen Reactions  . Penicillins     Childhood reaction    Review of Systems  Eyes: Negative for visual disturbance.  Respiratory: Negative for cough.   Cardiovascular: Negative for chest pain and palpitations.  Neurological: Negative for headaches.   Objective:  BP 140/90 mmHg  Pulse 79  Temp(Src) 98.4 F (36.9 C)  Resp 16  Ht 5' 3"  (1.6 m)  Wt  259 lb (117.482 kg)  BMI 45.89 kg/m2  LMP 08/20/2014 (Exact Date)  Breastfeeding? Yes  Physical Exam  Constitutional: She is oriented to person, place, and time and well-developed, well-nourished, and in no distress.  HENT:  Head: Normocephalic and atraumatic.  Right Ear: Hearing and external ear normal.  Left Ear: Hearing and external ear normal.  Eyes: Conjunctivae are normal.  Neck: Normal range of motion.  Cardiovascular: Normal rate, regular rhythm and normal heart sounds.   No murmur heard. Pulmonary/Chest: Effort normal and breath sounds normal.  Musculoskeletal:       Right lower leg: She exhibits no edema.       Left lower leg: She exhibits no edema.  Neurological: She is alert and oriented to person, place, and time. Gait normal.  Skin: Skin is warm and dry.  Psychiatric: Mood, memory, affect and judgment normal.  Vitals reviewed.  Results for orders placed or performed in visit on 06/13/15  COMPLETE METABOLIC PANEL WITH GFR  Result Value Ref Range   Sodium 142 135 - 146 mmol/L   Potassium 4.2 3.5 - 5.3 mmol/L   Chloride 106 98 - 110 mmol/L   CO2 25 20 - 31 mmol/L   Glucose, Bld 94 65 - 99 mg/dL   BUN 9 7 - 25 mg/dL  Creat 0.56 0.50 - 1.10 mg/dL   Total Bilirubin 0.3 0.2 - 1.2 mg/dL   Alkaline Phosphatase 79 33 - 115 U/L   AST 23 10 - 30 U/L   ALT 28 6 - 29 U/L   Total Protein 6.5 6.1 - 8.1 g/dL   Albumin 4.0 3.6 - 5.1 g/dL   Calcium 9.0 8.6 - 10.2 mg/dL   GFR, Est African American >89 >=60 mL/min   GFR, Est Non African American >89 >=60 mL/min  TSH  Result Value Ref Range   TSH 1.223 0.350 - 4.500 uIU/mL    Assessment and Plan :  Benign essential HTN - Plan: COMPLETE METABOLIC PANEL WITH GFR, TSH   BP still elevated today - will increase Labetalol 126m to tid.  She will check her BP at home and we will recheck in a month - she will let me know if this increase in dose does not reduce her BP.  SWindell HummingbirdPA-C  [facility] 06/14/2015 10:11 AM

## 2015-07-01 ENCOUNTER — Other Ambulatory Visit: Payer: Self-pay | Admitting: Physician Assistant

## 2015-07-01 DIAGNOSIS — I10 Essential (primary) hypertension: Secondary | ICD-10-CM

## 2015-07-01 MED ORDER — LABETALOL HCL 100 MG PO TABS: 100.0000 mg | ORAL_TABLET | Freq: Three times a day (TID) | ORAL | Status: DC

## 2015-08-07 ENCOUNTER — Other Ambulatory Visit: Payer: Self-pay | Admitting: Obstetrics and Gynecology

## 2015-08-08 ENCOUNTER — Other Ambulatory Visit: Payer: Self-pay | Admitting: Physician Assistant

## 2015-08-08 DIAGNOSIS — I10 Essential (primary) hypertension: Secondary | ICD-10-CM

## 2015-08-08 LAB — CYTOLOGY - PAP

## 2015-08-08 MED ORDER — LABETALOL HCL 100 MG PO TABS: 200.0000 mg | ORAL_TABLET | Freq: Three times a day (TID) | ORAL | Status: DC

## 2015-08-08 NOTE — Progress Notes (Signed)
Bp is elevated about 8 hours after taking her dose of labetalol.  She is currently using 314m bid - we will change to 203mtid and she will monitor her BP with her new Rx allowing her to inrease to 30035mid - her goal BP is 110-125/70-80s.  Once we figure out what her current dose needs to be we will send in a 90d supply

## 2015-08-27 ENCOUNTER — Ambulatory Visit (INDEPENDENT_AMBULATORY_CARE_PROVIDER_SITE_OTHER): Payer: BC Managed Care – PPO | Admitting: Physician Assistant

## 2015-08-27 VITALS — BP 132/80 | HR 79 | Temp 98.0°F | Resp 17 | Ht 65.0 in | Wt 258.0 lb

## 2015-08-27 DIAGNOSIS — R110 Nausea: Secondary | ICD-10-CM

## 2015-08-27 DIAGNOSIS — I10 Essential (primary) hypertension: Secondary | ICD-10-CM | POA: Diagnosis not present

## 2015-08-27 DIAGNOSIS — R42 Dizziness and giddiness: Secondary | ICD-10-CM

## 2015-08-27 LAB — GLUCOSE, POCT (MANUAL RESULT ENTRY): POC Glucose: 103 mg/dl — AB (ref 70–99)

## 2015-08-27 MED ORDER — LABETALOL HCL 300 MG PO TABS: 300.0000 mg | ORAL_TABLET | Freq: Three times a day (TID) | ORAL | Status: DC

## 2015-08-27 MED ORDER — MECLIZINE HCL 25 MG PO TABS: 25.0000 mg | ORAL_TABLET | Freq: Three times a day (TID) | ORAL | Status: DC | PRN

## 2015-08-27 MED ORDER — ONDANSETRON 4 MG PO TBDP
4.0000 mg | ORAL_TABLET | Freq: Once | ORAL | Status: AC
  Administered 2015-08-27: 4 mg via ORAL

## 2015-08-27 MED ORDER — ONDANSETRON 4 MG PO TBDP: 4.0000 mg | ORAL_TABLET | Freq: Once | ORAL | Status: DC

## 2015-08-27 NOTE — Progress Notes (Signed)
Stacey Singh  MRN: [id number] DOB: 1976/07/20  Subjective:  Pt presents to clinic with acute onset vertigo this am and it occurs every time Stacey Singh moves but then stops quickly when movement stops. Stacey Singh has had nausea with the vertigo and 1 episode of resulting in vomiting.  Stacey Singh finds that movement of Stacey Singh head backwards makes it worse.  Stacey Singh has never had this before.  Stacey Singh is having no headaches or weakness.  Stacey Singh tinnitus, hearing loss or ear pain.  No recent cold symptoms or allergy symptom.  Stacey Singh has been eating and drinking normal.  Stacey Singh has recently had HTN that has been hard to control but Stacey Singh is on Stacey Singh current medication and Stacey Singh is doing well.  When Stacey Singh diastolic BP is >/= 90 Stacey Singh gets hazy vision and Stacey Singh has had a few episodes of double vision (Stacey Singh is unsure if it is horizontal or vertical diplopia).  Stacey Singh has tried no medication.  Saw Dr Katy Fitch yesterday - normal eye exam - normal pressure  Patient Active Problem List   Diagnosis Date Noted  . Benign essential HTN 06/13/2015    Current Outpatient Prescriptions on File Prior to Visit  Medication Sig Dispense Refill  . Prenatal Vit-Fe Fumarate-FA (PRENATAL MULTIVITAMIN) TABS Take 1 tablet by mouth daily.    . ranitidine (ZANTAC) 150 MG tablet Take 150 mg by mouth daily.     No current facility-administered medications on file prior to visit.    Allergies  Allergen Reactions  . Penicillins     Childhood reaction    Review of Systems  Constitutional: Positive for diaphoresis (this am when Stacey Singh felt so bad). Negative for fever and chills.  HENT: Negative for congestion, ear discharge, ear pain, hearing loss and sore throat.        No tinnitus  Eyes: Positive for visual disturbance (blurry, double vision intermittent (5x over the last 3 months)).  Respiratory: Negative for cough and shortness of breath.   Cardiovascular: Negative for palpitations.  Gastrointestinal: Positive for nausea (with vertigo). Negative for vomiting, abdominal  pain and diarrhea.  Neurological: Positive for dizziness. Negative for weakness, numbness and headaches.   Objective:  BP 132/80 mmHg  Pulse 79  Temp(Src) 98 F (36.7 C) (Oral)  Resp 17  Ht 5' 5"  (1.651 m)  Wt 258 lb (117.028 kg)  BMI 42.93 kg/m2  SpO2 97%  Physical Exam  Constitutional: Stacey Singh is oriented to person, place, and time and well-developed, well-nourished, and in no distress.  HENT:  Head: Normocephalic and atraumatic.  Right Ear: Hearing, tympanic membrane, external ear and ear canal normal.  Left Ear: Hearing, tympanic membrane, external ear and ear canal normal.  Nose: Nose normal.  Mouth/Throat: Uvula is midline, oropharynx is clear and moist and mucous membranes are normal.  Eyes: Conjunctivae and EOM are normal. Pupils are equal, round, and reactive to light. Right eye exhibits no nystagmus. Left eye exhibits no nystagmus.  Neck: Normal range of motion.  Cardiovascular: Normal rate, regular rhythm and normal heart sounds.   No murmur heard. Pulmonary/Chest: Effort normal and breath sounds normal.  Neurological: Stacey Singh is alert and oriented to person, place, and time. Stacey Singh has normal sensation, normal strength, normal reflexes and intact cranial nerves. Stacey Singh displays no weakness, no tremor, facial symmetry and normal speech. No cranial nerve deficit or sensory deficit. Stacey Singh has a normal Straight Leg Raise Test, a normal Cerebellar Exam, a normal Finger-Nose-Finger Test, a normal Romberg Test and a normal Tandem Gait Test. Gait normal.  Coordination and gait normal.  Skin: Skin is warm and dry.  Psychiatric: Mood, memory, affect and judgment normal.  Vitals reviewed. Dix-hallpike + veritgo to the left - with torsonial nystagmyus and nausea  Epley manuevars performed x2 with about 75% resolution of symptoms.  Assessment and Plan :  Nausea without vomiting - Plan: ondansetron (ZOFRAN-ODT) disintegrating tablet 4 mg, ondansetron (ZOFRAN-ODT) disintegrating tablet 4  mg  Vertigo - Plan: meclizine (ANTIVERT) 25 MG tablet, POCT glucose (manual entry)  Essential hypertension - Plan: labetalol (NORMODYNE) 300 MG tablet   Unsure of exact cause of vertigo.  Pt has never had before and has a normal neuro exam today and had a normal vision exam yesterday. The vertigo is associated with head movement and tires quickly (<71mn).  After Eply manuevars Stacey Singh is much better.  Stacey Singh will repeat modified Epleys manuevars at home and use medication sparingly due to breastfeeding.  Stacey Singh will f/u in a week if the vertigo continues for a referral to ENT or neuro at that time.  Stacey Singh is to contact me if the diplopia returns and Stacey Singh will monitor for the directionality of it if it happens.  Stacey Singh will continue to monitor Stacey Singh BP frequently.    SWindell HummingbirdPA-C  [facility] 08/31/2015 9:05 PM

## 2015-08-27 NOTE — Patient Instructions (Signed)
Benign Positional Vertigo Vertigo is the feeling that you or your surroundings are moving when they are not. Benign positional vertigo is the most common form of vertigo. The cause of this condition is not serious (is benign). This condition is triggered by certain movements and positions (is positional). This condition can be dangerous if it occurs while you are doing something that could endanger you or others, such as driving.  CAUSES In many cases, the cause of this condition is not known. It may be caused by a disturbance in an area of the inner ear that helps your brain to sense movement and balance. This disturbance can be caused by a viral infection (labyrinthitis), head injury, or repetitive motion. RISK FACTORS This condition is more likely to develop in:  Women.  People who are 40 years of age or older. SYMPTOMS Symptoms of this condition usually happen when you move your head or your eyes in different directions. Symptoms may start suddenly, and they usually last for less than a minute. Symptoms may include:  Loss of balance and falling.  Feeling like you are spinning or moving.  Feeling like your surroundings are spinning or moving.  Nausea and vomiting.  Blurred vision.  Dizziness.  Involuntary eye movement (nystagmus). Symptoms can be mild and cause only slight annoyance, or they can be severe and interfere with daily life. Episodes of benign positional vertigo may return (recur) over time, and they may be triggered by certain movements. Symptoms may improve over time. DIAGNOSIS This condition is usually diagnosed by medical history and a physical exam of the head, neck, and ears. You may be referred to a health care provider who specializes in ear, nose, and throat (ENT) problems (otolaryngologist) or a provider who specializes in disorders of the nervous system (neurologist). You may have additional testing, including:  MRI.  A CT scan.  Eye movement tests. Your  health care provider may ask you to change positions quickly while he or she watches you for symptoms of benign positional vertigo, such as nystagmus. Eye movement may be tested with an electronystagmogram (ENG), caloric stimulation, the Dix-Hallpike test, or the roll test.  An electroencephalogram (EEG). This records electrical activity in your brain.  Hearing tests. TREATMENT Usually, your health care provider will treat this by moving your head in specific positions to adjust your inner ear back to normal. Surgery may be needed in severe cases, but this is rare. In some cases, benign positional vertigo may resolve on its own in 2-4 weeks. HOME CARE INSTRUCTIONS Safety  Move slowly.Avoid sudden body or head movements.  Avoid driving.  Avoid operating heavy machinery.  Avoid doing any tasks that would be dangerous to you or others if a vertigo episode would occur.  If you have trouble walking or keeping your balance, try using a cane for stability. If you feel dizzy or unstable, sit down right away.  Return to your normal activities as told by your health care provider. Ask your health care provider what activities are safe for you. General Instructions  Take over-the-counter and prescription medicines only as told by your health care provider.  Avoid certain positions or movements as told by your health care provider.  Drink enough fluid to keep your urine clear or pale yellow.  Keep all follow-up visits as told by your health care provider. This is important. SEEK MEDICAL CARE IF:  You have a fever.  Your condition gets worse or you develop new symptoms.  Your family or friends   notice any behavioral changes.  Your nausea or vomiting gets worse.  You have numbness or a "pins and needles" sensation. SEEK IMMEDIATE MEDICAL CARE IF:  You have difficulty speaking or moving.  You are always dizzy.  You faint.  You develop severe headaches.  You have weakness in your  legs or arms.  You have changes in your hearing or vision.  You develop a stiff neck.  You develop sensitivity to light.   This information is not intended to replace advice given to you by your health care provider. Make sure you discuss any questions you have with your health care provider.   Document Released: 04/20/2006 Document Revised: 04/03/2015 Document Reviewed: 11/05/2014 Elsevier Interactive Patient Education 2016 Elsevier Inc.  

## 2015-11-07 ENCOUNTER — Encounter: Payer: Self-pay | Admitting: Physician Assistant

## 2015-11-07 ENCOUNTER — Ambulatory Visit (INDEPENDENT_AMBULATORY_CARE_PROVIDER_SITE_OTHER): Payer: BC Managed Care – PPO | Admitting: Physician Assistant

## 2015-11-07 VITALS — BP 113/80 | HR 76 | Temp 97.7°F | Resp 16 | Ht 65.0 in | Wt 249.0 lb

## 2015-11-07 DIAGNOSIS — I10 Essential (primary) hypertension: Secondary | ICD-10-CM

## 2015-11-07 MED ORDER — LABETALOL HCL 300 MG PO TABS: 300.0000 mg | ORAL_TABLET | Freq: Three times a day (TID) | ORAL | Status: DC

## 2015-11-07 NOTE — Progress Notes (Signed)
   Stacey Singh  MRN: [id number] DOB: 1976-01-29  Subjective:  Pt presents to clinic for HTN recheck.  She is tolerating her medications ok.  She is doing ok with her tid dosing - she will sometimes gets some blurry vision with long term focusing when she forgets to take the middle dose which she does a few times a week- she does not have problems with long distance vision when she changes focus from up close.  She feels really good.  Patient Active Problem List   Diagnosis Date Noted  . Benign essential HTN 06/13/2015    Current Outpatient Prescriptions on File Prior to Visit  Medication Sig Dispense Refill  . Prenatal Vit-Fe Fumarate-FA (PRENATAL MULTIVITAMIN) TABS Take 1 tablet by mouth daily.    . ranitidine (ZANTAC) 150 MG tablet Take 150 mg by mouth daily. Reported on 11/07/2015     No current facility-administered medications on file prior to visit.    Allergies  Allergen Reactions  . Penicillins     Childhood reaction    Review of Systems  Constitutional: Negative for fever and chills.  Respiratory: Negative for shortness of breath.   Cardiovascular: Negative for chest pain, palpitations and leg swelling.   Objective:  BP 113/80 mmHg  Pulse 76  Temp(Src) 97.7 F (36.5 C)  Resp 16  Ht 5' 5"  (1.651 m)  Wt 249 lb (112.946 kg)  BMI 41.44 kg/m2  Physical Exam  Constitutional: She is oriented to person, place, and time and well-developed, well-nourished, and in no distress.  HENT:  Head: Normocephalic and atraumatic.  Right Ear: Hearing and external ear normal.  Left Ear: Hearing and external ear normal.  Eyes: Conjunctivae are normal.  Neck: Normal range of motion.  Cardiovascular: Normal rate, regular rhythm and normal heart sounds.   No murmur heard. Pulmonary/Chest: Effort normal and breath sounds normal.  Musculoskeletal:       Right lower leg: She exhibits no edema.       Left lower leg: She exhibits no edema.  Neurological: She is alert and oriented to  person, place, and time. Gait normal.  Skin: Skin is warm and dry.  Psychiatric: Mood, memory, affect and judgment normal.  Vitals reviewed.   Assessment and Plan :  Essential hypertension - Plan: labetalol (NORMODYNE) 300 MG tablet  Well controlled - continue current medications - we discussed ways to remember that middle day dosing - plan on continuing this medication until she is not longer nursing and then we will switch to a once daily medication for convinence.  Windell Hummingbird PA-C  [facility] 11/07/2015 2:41 PM

## 2015-11-07 NOTE — Patient Instructions (Signed)
     IF you received an x-ray today, you will receive an invoice from [facility]. Please contact [facility] at [phone] with questions or concerns regarding your invoice.   IF you received labwork today, you will receive an invoice from [facility]. Please contact [facility] at [phone] with questions or concerns regarding your invoice.   Our billing staff will not be able to assist you with questions regarding bills from these companies.  You will be contacted with the lab results as soon as they are available. The fastest way to get your results is to activate your My Chart account. Instructions are located on the last page of this paperwork. If you have not heard from us regarding the results in 2 weeks, please contact this office.      

## 2016-08-25 ENCOUNTER — Other Ambulatory Visit: Payer: Self-pay | Admitting: Physician Assistant

## 2016-08-25 MED ORDER — GENTAMICIN SULFATE 0.3 % OP SOLN: 2.0000 [drp] | OPHTHALMIC | 0 refills | Status: DC

## 2016-08-27 ENCOUNTER — Encounter: Payer: Self-pay | Admitting: Physician Assistant

## 2016-08-27 ENCOUNTER — Ambulatory Visit (INDEPENDENT_AMBULATORY_CARE_PROVIDER_SITE_OTHER): Payer: BC Managed Care – PPO | Admitting: Physician Assistant

## 2016-08-27 VITALS — BP 144/97 | HR 97 | Temp 98.1°F | Resp 18 | Ht 65.0 in | Wt 260.8 lb

## 2016-08-27 DIAGNOSIS — J0100 Acute maxillary sinusitis, unspecified: Secondary | ICD-10-CM | POA: Diagnosis not present

## 2016-08-27 DIAGNOSIS — I10 Essential (primary) hypertension: Secondary | ICD-10-CM | POA: Diagnosis not present

## 2016-08-27 MED ORDER — LOSARTAN POTASSIUM 50 MG PO TABS: 50.0000 mg | ORAL_TABLET | Freq: Every day | ORAL | 0 refills | Status: DC

## 2016-08-27 MED ORDER — CLARITHROMYCIN 500 MG PO TABS: 500.0000 mg | ORAL_TABLET | Freq: Two times a day (BID) | ORAL | 0 refills | Status: DC

## 2016-08-27 NOTE — Progress Notes (Signed)
Stacey Singh  MRN: [id number] DOB: 16-Aug-1975  Subjective:  Pt presents to clinic with sinus pressure for 10-14d - not getting better - she has been using mucinex and a few doses of sudafed.  She is not having nasal congestion anymore - but her left maxillary sinus hurts.  She has left teeth pain and increased pressure when she bends over.  She has not have fevers or chills.    She has stopped taking her BP medications because taking it tid was just to hard - she is still nursing her daughter (38 months old) but typically only at night and not for very long.  She does not feel bad.  She does not take her BP at home.  Review of Systems  Constitutional: Negative for chills and fever.  HENT: Positive for congestion, postnasal drip and sinus pressure (left maxillary).   Respiratory: Negative for shortness of breath.   Cardiovascular: Negative for chest pain, palpitations and leg swelling.  Neurological: Negative for headaches.    Patient Active Problem List   Diagnosis Date Noted  . Benign essential HTN 06/13/2015    Current Outpatient Prescriptions on File Prior to Visit  Medication Sig Dispense Refill  . gentamicin (GARAMYCIN) 0.3 % ophthalmic solution Place 2 drops into both eyes every 4 (four) hours. 5 mL 0  . Prenatal Vit-Fe Fumarate-FA (PRENATAL MULTIVITAMIN) TABS Take 1 tablet by mouth daily.    . ranitidine (ZANTAC) 150 MG tablet Take 150 mg by mouth daily. Reported on 11/07/2015     No current facility-administered medications on file prior to visit.     Allergies  Allergen Reactions  . Penicillins     Childhood reaction    Pt patients past, family and social history were reviewed and updated.   Objective:  BP (!) 144/97   Pulse 97   Temp 98.1 F (36.7 C) (Oral)   Resp 18   Ht 5' 5"  (1.651 m)   Wt 260 lb 12.8 oz (118.3 kg)   LMP 08/24/2016   SpO2 96%   BMI 43.40 kg/m   Physical Exam  Constitutional: She is oriented to person, place, and time and  well-developed, well-nourished, and in no distress.  HENT:  Head: Normocephalic and atraumatic.  Right Ear: Hearing, tympanic membrane, external ear and ear canal normal.  Left Ear: Hearing, tympanic membrane, external ear and ear canal normal.  Nose: Left sinus exhibits maxillary sinus tenderness.  Mouth/Throat: Uvula is midline, oropharynx is clear and moist and mucous membranes are normal.  Eyes: Conjunctivae are normal.  Neck: Normal range of motion.  Cardiovascular: Normal rate, regular rhythm and normal heart sounds.   No murmur heard. Pulmonary/Chest: Effort normal and breath sounds normal.  Musculoskeletal:       Right lower leg: She exhibits no edema.       Left lower leg: She exhibits no edema.  Neurological: She is alert and oriented to person, place, and time. Gait normal.  Skin: Skin is warm and dry.  Psychiatric: Mood, memory, affect and judgment normal.  Vitals reviewed.   Assessment and Plan :  Acute non-recurrent maxillary sinusitis - Plan: clarithromycin (BIAXIN) 500 MG tablet - continue mucinex  Essential hypertension - Plan: losartan (COZAAR) 50 MG tablet, CMP14+EGFR - start this - d/w pt that with the amount of nursing she is doing this will be fine - it is an L3 drug for nursing.  Windell Hummingbird PA-C  [facility] 08/27/2016 6:31 PM

## 2016-08-27 NOTE — Patient Instructions (Signed)
     IF you received an x-ray today, you will receive an invoice from [facility]. Please contact [facility] at [phone] with questions or concerns regarding your invoice.   IF you received labwork today, you will receive an invoice from [facility]. Please contact [facility] at [phone] with questions or concerns regarding your invoice.   Our billing staff will not be able to assist you with questions regarding bills from these companies.  You will be contacted with the lab results as soon as they are available. The fastest way to get your results is to activate your My Chart account. Instructions are located on the last page of this paperwork. If you have not heard from us regarding the results in 2 weeks, please contact this office.     

## 2016-08-28 LAB — CMP14+EGFR
A/G RATIO: 1.5 (ref 1.2–2.2)
ALT: 15 IU/L (ref 0–32)
AST: 15 IU/L (ref 0–40)
Albumin: 4.2 g/dL (ref 3.5–5.5)
Alkaline Phosphatase: 80 IU/L (ref 39–117)
BUN/Creatinine Ratio: 12 (ref 9–23)
BUN: 7 mg/dL (ref 6–24)
Bilirubin Total: 0.3 mg/dL (ref 0.0–1.2)
CHLORIDE: 100 mmol/L (ref 96–106)
CO2: 22 mmol/L (ref 18–29)
Calcium: 8.7 mg/dL (ref 8.7–10.2)
Creatinine, Ser: 0.57 mg/dL (ref 0.57–1.00)
GFR calc non Af Amer: 116 mL/min/{1.73_m2} (ref >59)
GFR, EST AFRICAN AMERICAN: 134 mL/min/{1.73_m2} (ref >59)
GLOBULIN, TOTAL: 2.8 g/dL (ref 1.5–4.5)
Glucose: 81 mg/dL (ref 65–99)
POTASSIUM: 4 mmol/L (ref 3.5–5.2)
SODIUM: 139 mmol/L (ref 134–144)
TOTAL PROTEIN: 7 g/dL (ref 6.0–8.5)

## 2016-09-07 ENCOUNTER — Other Ambulatory Visit: Payer: Self-pay | Admitting: Obstetrics and Gynecology

## 2016-09-08 LAB — CYTOLOGY - PAP

## 2016-11-27 ENCOUNTER — Ambulatory Visit (INDEPENDENT_AMBULATORY_CARE_PROVIDER_SITE_OTHER): Payer: BC Managed Care – PPO | Admitting: Physician Assistant

## 2016-11-27 ENCOUNTER — Encounter: Payer: Self-pay | Admitting: Physician Assistant

## 2016-11-27 DIAGNOSIS — I10 Essential (primary) hypertension: Secondary | ICD-10-CM | POA: Diagnosis not present

## 2016-11-27 MED ORDER — LOSARTAN POTASSIUM 100 MG PO TABS: 100.0000 mg | ORAL_TABLET | Freq: Every day | ORAL | 1 refills | Status: DC

## 2016-11-27 NOTE — Assessment & Plan Note (Signed)
Increased Cozaar to 129m - pt encouraged to check her BP at home and contact me with her readings.

## 2016-11-27 NOTE — Patient Instructions (Signed)
     IF you received an x-ray today, you will receive an invoice from [facility]. Please contact [facility] at [phone] with questions or concerns regarding your invoice.   IF you received labwork today, you will receive an invoice from [facility]. Please contact [facility] at [phone] with questions or concerns regarding your invoice.   Our billing staff will not be able to assist you with questions regarding bills from these companies.  You will be contacted with the lab results as soon as they are available. The fastest way to get your results is to activate your My Chart account. Instructions are located on the last page of this paperwork. If you have not heard from us regarding the results in 2 weeks, please contact this office.     

## 2016-11-27 NOTE — Progress Notes (Signed)
   Stacey Singh  MRN: [id number] DOB: [birth year]  PCP: Mancel Bale, PA-C  Chief Complaint  Patient presents with  . Medication Refill    losartan    Subjective:  Pt presents to clinic for medication recheck.  She has been taking her BP medication without problems.  She has not been checking her BP at home.  She feels fine.  She does still sometimes gets this vision thing that she had when prior to BP treatment when her BP was high.  Review of Systems  Constitutional: Negative for chills and fever.  Respiratory: Negative for shortness of breath.   Cardiovascular: Negative for chest pain, palpitations and leg swelling.  Neurological: Negative for headaches.    Patient Active Problem List   Diagnosis Date Noted  . Benign essential HTN 06/13/2015    Current Outpatient Prescriptions on File Prior to Visit  Medication Sig Dispense Refill  . Prenatal Vit-Fe Fumarate-FA (PRENATAL MULTIVITAMIN) TABS Take 1 tablet by mouth daily.    . ranitidine (ZANTAC) 150 MG tablet Take 150 mg by mouth daily. Reported on 11/07/2015     No current facility-administered medications on file prior to visit.     Allergies  Allergen Reactions  . Penicillins     Childhood reaction    Pt patients past, family and social history were reviewed and updated.   Objective:  BP 136/89 (BP Location: Right Arm, Patient Position: Sitting, Cuff Size: Large)   Pulse (!) 102   Temp 99.1 F (37.3 C) (Oral)   Resp 17   Ht 5' 5"  (1.651 m)   Wt 264 lb (119.7 kg)   LMP 11/04/2016   SpO2 98%   Breastfeeding? No   BMI 43.93 kg/m   Physical Exam  Constitutional: She is oriented to person, place, and time and well-developed, well-nourished, and in no distress.  HENT:  Head: Normocephalic and atraumatic.  Right Ear: Hearing and external ear normal.  Left Ear: Hearing and external ear normal.  Eyes: Conjunctivae are normal.  Neck: Normal range of motion.  Cardiovascular: Normal rate, regular rhythm and  normal heart sounds.   No murmur heard. Pulmonary/Chest: Effort normal and breath sounds normal.  Musculoskeletal:       Right lower leg: She exhibits no edema.       Left lower leg: She exhibits no edema.  Neurological: She is alert and oriented to person, place, and time. Gait normal.  Skin: Skin is warm and dry.  Psychiatric: Mood, memory, affect and judgment normal.  Vitals reviewed.   Assessment and Plan :   Problem List Items Addressed This Visit      Cardiovascular and Mediastinum   Benign essential HTN    Increased Cozaar to 179m - pt encouraged to check her BP at home and contact me with her readings.      Relevant Medications   losartan (COZAAR) 100 MG tablet    Other Visit Diagnoses    Essential hypertension       Relevant Medications   losartan (COZAAR) 100 MG tablet       SWindell HummingbirdPA-C  [facility]/10/2016 7:13 PM

## 2017-04-13 ENCOUNTER — Encounter: Payer: Self-pay | Admitting: Physician Assistant

## 2017-04-13 ENCOUNTER — Ambulatory Visit (INDEPENDENT_AMBULATORY_CARE_PROVIDER_SITE_OTHER): Payer: BC Managed Care – PPO | Admitting: Physician Assistant

## 2017-04-13 VITALS — BP 123/87 | HR 105 | Temp 98.0°F | Resp 18 | Ht 63.78 in | Wt 262.8 lb

## 2017-04-13 DIAGNOSIS — R059 Cough, unspecified: Secondary | ICD-10-CM

## 2017-04-13 DIAGNOSIS — R05 Cough: Secondary | ICD-10-CM

## 2017-04-13 DIAGNOSIS — J020 Streptococcal pharyngitis: Secondary | ICD-10-CM

## 2017-04-13 DIAGNOSIS — J029 Acute pharyngitis, unspecified: Secondary | ICD-10-CM

## 2017-04-13 LAB — POCT RAPID STREP A (OFFICE): Rapid Strep A Screen: POSITIVE — AB

## 2017-04-13 MED ORDER — AZITHROMYCIN 250 MG PO TABS: ORAL_TABLET | ORAL | 0 refills | Status: AC

## 2017-04-13 MED ORDER — HYDROCODONE-ACETAMINOPHEN 5-325 MG PO TABS: 0.5000 | ORAL_TABLET | Freq: Four times a day (QID) | ORAL | 0 refills | Status: DC | PRN

## 2017-04-13 NOTE — Patient Instructions (Signed)
     IF you received an x-ray today, you will receive an invoice from [facility]. Please contact [facility] at [phone] with questions or concerns regarding your invoice.   IF you received labwork today, you will receive an invoice from [facility]. Please contact [facility] at [phone] with questions or concerns regarding your invoice.   Our billing staff will not be able to assist you with questions regarding bills from these companies.  You will be contacted with the lab results as soon as they are available. The fastest way to get your results is to activate your My Chart account. Instructions are located on the last page of this paperwork. If you have not heard from us regarding the results in 2 weeks, please contact this office.     

## 2017-04-13 NOTE — Progress Notes (Signed)
Stacey Singh  MRN: [id number] DOB: March 22, 1976  PCP: Mancel Bale, PA-C  Chief Complaint  Patient presents with  . Cough    yellow to green mucus x1week   . Sore Throat    Subjective:  Pt presents to clinic for cough that started a week ago and is not getting better. She is coughing green and yellow mucus.  She is not having sinus pressure.  She also has a terrible sore throat that really does not change throughout the day though does get better with use of motrin.  She has been using mucinex and advil and tylenol.  History is obtained by patient.  Review of Systems  Constitutional: Negative for chills and fever.  HENT: Positive for sore throat. Negative for congestion, postnasal drip and rhinorrhea.   Respiratory: Positive for cough, chest tightness and shortness of breath. Negative for wheezing.   Cardiovascular: Negative for chest pain.  Neurological: Positive for headaches (better since adding tylenol and motrin).    Patient Active Problem List   Diagnosis Date Noted  . Benign essential HTN 06/13/2015    Current Outpatient Prescriptions on File Prior to Visit  Medication Sig Dispense Refill  . losartan (COZAAR) 100 MG tablet Take 1 tablet (100 mg total) by mouth daily. 90 tablet 1  . Prenatal Vit-Fe Fumarate-FA (PRENATAL MULTIVITAMIN) TABS Take 1 tablet by mouth daily.    . ranitidine (ZANTAC) 150 MG tablet Take 150 mg by mouth daily. Reported on 11/07/2015     No current facility-administered medications on file prior to visit.     Allergies  Allergen Reactions  . Penicillins   . Penicillins     Childhood reaction    Past Medical History:  Diagnosis Date  . Allergy   . GERD (gastroesophageal reflux disease)   . Heart murmur    in high school, resolved  . Heartburn in pregnancy   . Hypertension    with current pregnancy  . Hypertension   . No pertinent past medical history    Social History   Social History Narrative   ** Merged History  Encounter **       Social History  Substance Use Topics  . Smoking status: Never Smoker  . Smokeless tobacco: Never Used  . Alcohol use Yes   family history includes Cancer in her maternal grandmother and mother; Diabetes in her brother; Heart disease in her father and maternal grandfather; Hypertension in her brother, mother, and sister.     Objective:  BP 123/87   Pulse (!) 105   Temp 98 F (36.7 C) (Oral)   Resp 18   Ht 5' 3.78" (1.62 m)   Wt 262 lb 12.8 oz (119.2 kg)   LMP 04/05/2017   SpO2 98%   BMI 45.42 kg/m  Body mass index is 45.42 kg/m.  Physical Exam  Constitutional: She is oriented to person, place, and time and well-developed, well-nourished, and in no distress.  HENT:  Head: Normocephalic and atraumatic.  Right Ear: Hearing, tympanic membrane, external ear and ear canal normal.  Left Ear: Hearing, tympanic membrane, external ear and ear canal normal.  Nose: Nose normal. No mucosal edema.  Mouth/Throat: Uvula is midline and mucous membranes are normal. No uvula swelling. Posterior oropharyngeal edema and posterior oropharyngeal erythema present. No oropharyngeal exudate.  Tonsils 2+ and almost touching uvula  Eyes: Conjunctivae are normal.  Neck: Normal range of motion.  Cardiovascular: Normal rate, regular rhythm and normal heart sounds.   No murmur heard.  Pulmonary/Chest: Effort normal and breath sounds normal.  Deep sounding cough  Lymphadenopathy:       Head (right side): No tonsillar and no occipital adenopathy present.       Head (left side): No tonsillar and no occipital adenopathy present.    She has cervical adenopathy.       Right cervical: Superficial cervical adenopathy present.       Left cervical: Superficial cervical adenopathy present.       Right: No supraclavicular adenopathy present.       Left: No supraclavicular adenopathy present.  Neurological: She is alert and oriented to person, place, and time. Gait normal.  Skin: Skin is warm  and dry.  Psychiatric: Mood, memory, affect and judgment normal.  Vitals reviewed.   Assessment and Plan :  Sore throat - Plan: POCT rapid strep A  Streptococcal sore throat - Plan: azithromycin (ZITHROMAX Z-PAK) 250 MG tablet  Cough - Plan: HYDROcodone-acetaminophen (NORCO/VICODIN) 5-325 MG tablet - pt is unable to tolerate liquid cough syrups.    Continue mucinex and motrin/tyelnol to help with pain.  Be mindful of transmission with young children.  Windell Hummingbird PA-C  [facility] 04/16/2017 1:44 PM

## 2017-04-23 ENCOUNTER — Other Ambulatory Visit: Payer: Self-pay | Admitting: Physician Assistant

## 2017-04-23 MED ORDER — PREDNISONE 10 MG PO TABS: ORAL_TABLET | ORAL | 0 refills | Status: DC

## 2017-04-23 NOTE — Progress Notes (Signed)
Pt still has signifiant swollen tonsils.  Last glucose 2/18 was 81.  Will send in steroid taper.

## 2017-04-24 ENCOUNTER — Telehealth: Payer: Self-pay | Admitting: *Deleted

## 2017-04-24 ENCOUNTER — Other Ambulatory Visit: Payer: Self-pay | Admitting: Physician Assistant

## 2017-04-24 MED ORDER — PREDNISONE 10 MG PO TABS: ORAL_TABLET | ORAL | 0 refills | Status: AC

## 2017-04-24 NOTE — Telephone Encounter (Signed)
Called Prednisone 10 mg to [facility] spoke to [place] gave verbal instructions for Rx.

## 2017-06-04 ENCOUNTER — Other Ambulatory Visit: Payer: Self-pay | Admitting: Physician Assistant

## 2017-06-04 NOTE — Progress Notes (Signed)
Opened in error

## 2017-06-23 ENCOUNTER — Other Ambulatory Visit: Payer: Self-pay | Admitting: *Deleted

## 2017-06-23 ENCOUNTER — Telehealth: Payer: Self-pay | Admitting: Physician Assistant

## 2017-06-23 DIAGNOSIS — I10 Essential (primary) hypertension: Secondary | ICD-10-CM

## 2017-06-23 MED ORDER — LOSARTAN POTASSIUM 100 MG PO TABS: 100.0000 mg | ORAL_TABLET | Freq: Every day | ORAL | 1 refills | Status: DC

## 2017-06-23 NOTE — Telephone Encounter (Signed)
Call to patient- patient states she was just in and provider was happy with her BP. She was reminded that she will be due annual exam in May. Patient states she will follow up and she is doing great. BP medication refilled.

## 2017-06-23 NOTE — Telephone Encounter (Signed)
Copied from [place]. Topic: Quick Communication - See Telephone Encounter >> Jun 23, 2017  2:04 PM Percell Belt A wrote: CRM for notification. See Telephone encounter for: pt called in and need refill on her losartan (COZAAR) 100 MG tablet [[id number] Pharmacy - [facility] [address]   06/23/17.

## 2017-06-24 ENCOUNTER — Other Ambulatory Visit: Payer: Self-pay | Admitting: Physician Assistant

## 2017-06-24 DIAGNOSIS — I10 Essential (primary) hypertension: Secondary | ICD-10-CM

## 2017-06-25 ENCOUNTER — Other Ambulatory Visit: Payer: Self-pay | Admitting: *Deleted

## 2017-06-25 DIAGNOSIS — I10 Essential (primary) hypertension: Secondary | ICD-10-CM

## 2017-06-25 MED ORDER — LOSARTAN POTASSIUM 100 MG PO TABS: 100.0000 mg | ORAL_TABLET | Freq: Every day | ORAL | 1 refills | Status: DC

## 2017-06-25 NOTE — Telephone Encounter (Signed)
Copied from [place]. Topic: Quick Communication - See Telephone Encounter >> Jun 25, 2017  1:42 PM Vernona Rieger wrote: CRM for notification. See Telephone encounter for:  Pt called stating she submitted a refill request on her lostartan and it was sent to [facility] in [place] by accident. Patient called back yesterday to get that changed and sent to the [facility]. Whoever she spoke with told it would be taken care by the end of the day yesterday & the [facility] [address] in [place] still does not have the request. Please advise. Please call her if she can get this refill & when it will be sent. She is out of the meds & that's because she called this in 3 days ago, Per patient. 06/25/17.

## 2017-06-25 NOTE — Telephone Encounter (Signed)
Patient notified Rx has been re sent to pharmacy.

## 2017-12-29 ENCOUNTER — Other Ambulatory Visit: Payer: Self-pay

## 2017-12-29 ENCOUNTER — Ambulatory Visit: Payer: BC Managed Care – PPO | Admitting: Physician Assistant

## 2017-12-29 ENCOUNTER — Encounter: Payer: Self-pay | Admitting: Physician Assistant

## 2017-12-29 VITALS — BP 118/88 | HR 87 | Temp 98.8°F | Resp 18 | Ht 63.78 in | Wt 261.4 lb

## 2017-12-29 DIAGNOSIS — I10 Essential (primary) hypertension: Secondary | ICD-10-CM | POA: Diagnosis not present

## 2017-12-29 DIAGNOSIS — Z13220 Encounter for screening for lipoid disorders: Secondary | ICD-10-CM | POA: Diagnosis not present

## 2017-12-29 DIAGNOSIS — Z833 Family history of diabetes mellitus: Secondary | ICD-10-CM

## 2017-12-29 DIAGNOSIS — Z6841 Body Mass Index (BMI) 40.0 and over, adult: Secondary | ICD-10-CM | POA: Insufficient documentation

## 2017-12-29 MED ORDER — LOSARTAN POTASSIUM 100 MG PO TABS: 100.0000 mg | ORAL_TABLET | Freq: Every day | ORAL | 1 refills | Status: DC

## 2017-12-29 NOTE — Progress Notes (Signed)
Stacey Singh  MRN: [id number] DOB: 08-Feb-1976  PCP: Mancel Bale, PA-C  Chief Complaint  Patient presents with  . Medication Refill    losartan     Subjective:  Pt presents to clinic for medication refill for her HTN.  She feels good and is tolerating medications well.  She has had a stressful last several months, with the death of her mother.  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  She has never had screening lab work done.  She has had her mammogram done.  She would also like me to look at a rash on her left breast that she has had for the last week or so.  It is bumpy and she only realizes it when she is washing.  It does not hurt or cause her discomfort or itch.  She is done nothing for the rash.  She has had no new bras or irritants to the area.  Patient also has skin tags under her left eye that irritates her when blinking that she would like removed.  History is obtained by patient.  Review of Systems  Constitutional: Negative for chills and fever.  Eyes: Negative for visual disturbance.  Respiratory: Negative for shortness of breath.   Cardiovascular: Negative for chest pain, palpitations and leg swelling.  Neurological: Negative for dizziness, light-headedness and headaches.    Patient Active Problem List   Diagnosis Date Noted  . Benign essential HTN 06/13/2015    Current Outpatient Medications on File Prior to Visit  Medication Sig Dispense Refill  . Prenatal Vit-Fe Fumarate-FA (PRENATAL MULTIVITAMIN) TABS Take 1 tablet by mouth daily.     No current facility-administered medications on file prior to visit.     Allergies  Allergen Reactions  . Penicillins   . Penicillins     Childhood reaction    Past Medical History:  Diagnosis Date  . Allergy   . GERD (gastroesophageal reflux disease)   . Heart murmur    in high school, resolved  . Heartburn in pregnancy   . Hypertension    with current pregnancy  .  Hypertension   . No pertinent past medical history    Social History   Social History Narrative   ** Merged History Encounter **       Social History   Tobacco Use  . Smoking status: Never Smoker  . Smokeless tobacco: Never Used  Substance Use Topics  . Alcohol use: Yes  . Drug use: No   family history includes Cancer in her maternal grandmother and mother; Diabetes in her brother; Heart disease in her father and maternal grandfather; Hypertension in her brother, mother, and sister.     Objective:  BP 118/88   Pulse 87   Temp 98.8 F (37.1 C) (Oral)   Resp 18   Ht 5' 3.78" (1.62 m)   Wt 261 lb 6.4 oz (118.6 kg)   LMP 12/20/2017   SpO2 97%   BMI 45.18 kg/m  Body mass index is 45.18 kg/m.  Physical Exam  Constitutional: She is oriented to person, place, and time. She appears well-developed and well-nourished.  HENT:  Head: Normocephalic and atraumatic.  Right Ear: Hearing and external ear normal.  Left Ear: Hearing and external ear normal.  Eyes: Conjunctivae are normal.    Neck: Normal range of motion.  Cardiovascular: Normal rate, regular rhythm and normal heart sounds.  No murmur heard. Pulmonary/Chest: Effort normal and breath sounds normal.  Musculoskeletal:       Right lower leg: She exhibits no edema.       Left lower leg: She exhibits no edema.  Neurological: She is alert and oriented to person, place, and time.  Skin: Skin is warm and dry.  Psychiatric: She has a normal mood and affect. Her behavior is normal. Judgment and thought content normal.  Vitals reviewed.  Procedure: Verbal consent obtained skin tag removed.  Assessment and Plan :  Essential hypertension - Plan: losartan (COZAAR) 100 MG tablet, CMP14+EGFR, TSH, Hemoglobin A1c -well-controlled blood pressure on medications, patient checks at home and gets similar readings.  We will do screening lab work.  Screening, lipid - Plan: Lipid panel  Family history of diabetes mellitus -  Plan: Hemoglobin A1c  Windell Hummingbird PA-C  [facility] 12/29/2017 10:36 AM

## 2017-12-29 NOTE — Patient Instructions (Signed)
     IF you received an x-ray today, you will receive an invoice from [facility]. Please contact [facility] at [phone] with questions or concerns regarding your invoice.   IF you received labwork today, you will receive an invoice from [facility]. Please contact [facility] at [phone] with questions or concerns regarding your invoice.   Our billing staff will not be able to assist you with questions regarding bills from these companies.  You will be contacted with the lab results as soon as they are available. The fastest way to get your results is to activate your My Chart account. Instructions are located on the last page of this paperwork. If you have not heard from us regarding the results in 2 weeks, please contact this office.     

## 2017-12-30 LAB — CMP14+EGFR
ALT: 24 IU/L (ref 0–32)
AST: 21 IU/L (ref 0–40)
Albumin/Globulin Ratio: 1.8 (ref 1.2–2.2)
Albumin: 4.3 g/dL (ref 3.5–5.5)
Alkaline Phosphatase: 66 IU/L (ref 39–117)
BUN/Creatinine Ratio: 12 (ref 9–23)
BUN: 8 mg/dL (ref 6–24)
Bilirubin Total: 0.2 mg/dL (ref 0.0–1.2)
CALCIUM: 9.4 mg/dL (ref 8.7–10.2)
CO2: 22 mmol/L (ref 20–29)
CREATININE: 0.68 mg/dL (ref 0.57–1.00)
Chloride: 107 mmol/L — ABNORMAL HIGH (ref 96–106)
GFR calc Af Amer: 126 mL/min/{1.73_m2} (ref >59)
GFR, EST NON AFRICAN AMERICAN: 109 mL/min/{1.73_m2} (ref >59)
GLUCOSE: 104 mg/dL — AB (ref 65–99)
Globulin, Total: 2.4 g/dL (ref 1.5–4.5)
POTASSIUM: 4.5 mmol/L (ref 3.5–5.2)
Sodium: 143 mmol/L (ref 134–144)
Total Protein: 6.7 g/dL (ref 6.0–8.5)

## 2017-12-30 LAB — LIPID PANEL
CHOL/HDL RATIO: 4.1 ratio (ref 0.0–4.4)
CHOLESTEROL TOTAL: 194 mg/dL (ref 100–199)
HDL: 47 mg/dL (ref >39)
LDL CALC: 132 mg/dL — AB (ref 0–99)
TRIGLYCERIDES: 77 mg/dL (ref 0–149)
VLDL Cholesterol Cal: 15 mg/dL (ref 5–40)

## 2017-12-30 LAB — HEMOGLOBIN A1C
ESTIMATED AVERAGE GLUCOSE: 111 mg/dL
Hgb A1c MFr Bld: 5.5 % (ref 4.8–5.6)

## 2017-12-30 LAB — TSH: TSH: 2.46 u[IU]/mL (ref 0.450–4.500)

## 2018-01-24 ENCOUNTER — Other Ambulatory Visit: Payer: Self-pay | Admitting: Physician Assistant

## 2018-01-24 MED ORDER — OLMESARTAN MEDOXOMIL 40 MG PO TABS: 40.0000 mg | ORAL_TABLET | Freq: Every day | ORAL | 0 refills | Status: DC

## 2018-01-24 NOTE — Progress Notes (Signed)
losaartan recall affecting patient - will change medication

## 2018-02-02 ENCOUNTER — Encounter: Payer: Self-pay | Admitting: Physician Assistant

## 2018-02-02 ENCOUNTER — Other Ambulatory Visit: Payer: Self-pay

## 2018-02-02 ENCOUNTER — Ambulatory Visit: Payer: BC Managed Care – PPO | Admitting: Physician Assistant

## 2018-02-02 VITALS — BP 116/80 | HR 117 | Temp 99.9°F | Resp 18 | Ht 63.78 in | Wt 260.8 lb

## 2018-02-02 DIAGNOSIS — J0100 Acute maxillary sinusitis, unspecified: Secondary | ICD-10-CM

## 2018-02-02 DIAGNOSIS — H5789 Other specified disorders of eye and adnexa: Secondary | ICD-10-CM

## 2018-02-02 MED ORDER — GUAIFENESIN ER 1200 MG PO TB12: 1.0000 | ORAL_TABLET | Freq: Two times a day (BID) | ORAL | 0 refills | Status: AC

## 2018-02-02 MED ORDER — OLOPATADINE HCL 0.2 % OP SOLN: 1.0000 [drp] | Freq: Every day | OPHTHALMIC | 0 refills | Status: DC

## 2018-02-02 MED ORDER — DOXYCYCLINE HYCLATE 100 MG PO TABS: 100.0000 mg | ORAL_TABLET | Freq: Two times a day (BID) | ORAL | 0 refills | Status: DC

## 2018-02-02 NOTE — Patient Instructions (Signed)
     IF you received an x-ray today, you will receive an invoice from [facility]. Please contact [facility] at [phone] with questions or concerns regarding your invoice.   IF you received labwork today, you will receive an invoice from [facility]. Please contact [facility] at [phone] with questions or concerns regarding your invoice.   Our billing staff will not be able to assist you with questions regarding bills from these companies.  You will be contacted with the lab results as soon as they are available. The fastest way to get your results is to activate your My Chart account. Instructions are located on the last page of this paperwork. If you have not heard from us regarding the results in 2 weeks, please contact this office.     

## 2018-02-02 NOTE — Progress Notes (Signed)
Stacey Singh  MRN: [id number] DOB: Dec 17, 1975  PCP: Stacey Bale, PA-C  Chief Complaint  Patient presents with  . Eye Problem    both eyes itchy, burning     Subjective:  Pt presents to clinic for bilateral eye irritation and some swelling with pressure behind the eye.  It started yesterday and then this am she woke up with sore throat on the right side and feeling some congested.  For the last several weeks she has had some unsteadiness that does seem a little better today.  She has h/o sinus infections. The area her eyes are sore.  She has had drainage from her eyes that is like mucus without any color of yellow or green.  Patient used Opcon eyedrops yesterday several times.  She noticed no difference with using this.  She also had some Polytrim at home.  That she is tried and really feels no difference.  History is obtained by patient.  Review of Systems  HENT: Positive for congestion (Mild this morning), sinus pressure and sore throat (This a.m. only).   Eyes: Positive for discharge (Mucous like worse in the morning), redness, itching (Feels better with rubbing) and visual disturbance (Only due to discharge). Negative for photophobia and pain.  Neurological: Positive for dizziness. Negative for headaches.    Patient Active Problem List   Diagnosis Date Noted  . Obesity, Class III, BMI 40-49.9 (morbid obesity) ([place]) 12/29/2017  . Benign essential HTN 06/13/2015    Current Outpatient Medications on File Prior to Visit  Medication Sig Dispense Refill  . olmesartan (BENICAR) 40 MG tablet Take 1 tablet (40 mg total) by mouth daily. 90 tablet 0  . Prenatal Vit-Fe Fumarate-FA (PRENATAL MULTIVITAMIN) TABS Take 1 tablet by mouth daily.     No current facility-administered medications on file prior to visit.     Allergies  Allergen Reactions  . Penicillins   . Penicillins     Childhood reaction    Past Medical History:  Diagnosis Date  . Allergy   . GERD  (gastroesophageal reflux disease)   . Heart murmur    in high school, resolved  . Heartburn in pregnancy   . Hypertension    with current pregnancy  . Hypertension   . No pertinent past medical history    Social History   Social History Narrative   ** Merged History Encounter **       Social History   Tobacco Use  . Smoking status: Never Smoker  . Smokeless tobacco: Never Used  Substance Use Topics  . Alcohol use: Yes  . Drug use: No   family history includes Cancer in her maternal grandmother and mother; Diabetes in her brother; Heart disease in her father and maternal grandfather; Hypertension in her brother, mother, and sister.     Objective:  BP 116/80   Pulse (!) 117   Temp 99.9 F (37.7 C) (Oral)   Resp 18   Ht 5' 3.78" (1.62 m)   Wt 260 lb 12.8 oz (118.3 kg)   LMP 01/07/2018   SpO2 96%   BMI 45.08 kg/m  Body mass index is 45.08 kg/m.  Wt Readings from Last 3 Encounters:  02/02/18 260 lb 12.8 oz (118.3 kg)  12/29/17 261 lb 6.4 oz (118.6 kg)  04/13/17 262 lb 12.8 oz (119.2 kg)    Physical Exam  Constitutional: She is oriented to person, place, and time. She appears well-developed and well-nourished.  HENT:  Head: Normocephalic and atraumatic.  Right Ear: Hearing and external ear normal.  Left Ear: Hearing and external ear normal.  Nose: Right sinus exhibits maxillary sinus tenderness (Worse on the side). Left sinus exhibits maxillary sinus tenderness.  Eyes: Pupils are equal, round, and reactive to light. EOM are normal. Right eye exhibits discharge (Tearing, mucous no purulence). Right eye exhibits no exudate and no hordeolum. No foreign body present in the right eye. Left eye exhibits no discharge (No purulence), no exudate and no hordeolum. No foreign body present in the left eye. Right conjunctiva is injected. Left conjunctiva is injected. No scleral icterus.  Fluorescein staining to eyes bilaterally, no corneal uptake.  Scleral uptake in the areas of  the eye with increased conjunctivitis.  Cobblestoning bilateral lower lids.  After her eyes were irrigated patient states her eyes felt much better.  Neck: Normal range of motion.  Pulmonary/Chest: Effort normal.  Neurological: She is alert and oriented to person, place, and time.  Skin: Skin is warm, dry and intact.  Psychiatric: She has a normal mood and affect. Her behavior is normal. Judgment and thought content normal.  Vitals reviewed.   Assessment and Plan :  Acute non-recurrent maxillary sinusitis - Plan: doxycycline (VIBRA-TABS) 100 MG tablet, Guaifenesin (MUCINEX MAXIMUM STRENGTH) 1200 MG TB12  Red eye  Eye drainage - Plan: Olopatadine HCl 0.2 % SOLN   I suspect patient has a viral illness that triggered a sinus infection due to recurrent congestion.  Suspect she has either viral conjunctivitis or more likely allergic conjunctivitis, this does not appear to be bacterial at this time.  We will give her an allergy eyedrop that she will use daily.  She will use an allergy pill OTC if she cares.  She will use Mucinex and the antibiotic for the sinus infection.  She will use rewetting drops in the Orajel for dry eyes.  Patient verbalized to me that they understand the following: diagnosis, what is being done for them, what to expect and what should be done at home.  Their questions have been answered.  See after visit summary for patient specific instructions.  Windell Hummingbird PA-C  [facility] 02/02/2018 2:45 PM  Please note: Portions of this report may have been transcribed using dragon voice recognition software. Every effort was made to ensure accuracy; however, inadvertent computerized transcription errors may be present.

## 2018-02-06 ENCOUNTER — Other Ambulatory Visit: Payer: Self-pay | Admitting: Physician Assistant

## 2018-02-06 MED ORDER — PREDNISONE 10 MG PO TABS: ORAL_TABLET | ORAL | 0 refills | Status: AC

## 2018-02-06 NOTE — Progress Notes (Signed)
Still javing problems with sinus infection - send in prednsone/

## 2018-02-11 ENCOUNTER — Other Ambulatory Visit: Payer: Self-pay | Admitting: Physician Assistant

## 2018-02-11 MED ORDER — CLARITHROMYCIN ER 500 MG PO TB24
1000.0000 mg | ORAL_TABLET | Freq: Every day | ORAL | 0 refills | Status: DC
Start: 2018-02-11 — End: 2018-02-11

## 2018-02-11 MED ORDER — CLARITHROMYCIN 500 MG PO TABS
500.0000 mg | ORAL_TABLET | Freq: Two times a day (BID) | ORAL | 0 refills | Status: DC
Start: 2018-02-11 — End: 2019-12-28

## 2018-02-11 NOTE — Progress Notes (Signed)
Still having intense sinus pain on the left side and swelling in the face.  She has finished doxy (had a lot of sun sensitivity) will treat with biaxin.

## 2018-02-17 ENCOUNTER — Encounter: Payer: Self-pay | Admitting: *Deleted

## 2018-03-30 ENCOUNTER — Other Ambulatory Visit: Payer: Self-pay | Admitting: Physician Assistant

## 2018-03-30 MED ORDER — OLMESARTAN MEDOXOMIL 40 MG PO TABS: 40.0000 mg | ORAL_TABLET | Freq: Every day | ORAL | 3 refills | Status: DC

## 2018-07-01 ENCOUNTER — Ambulatory Visit: Payer: BC Managed Care – PPO | Admitting: Physician Assistant

## 2019-12-07 ENCOUNTER — Other Ambulatory Visit: Payer: Self-pay | Admitting: Obstetrics and Gynecology

## 2019-12-07 DIAGNOSIS — R928 Other abnormal and inconclusive findings on diagnostic imaging of breast: Secondary | ICD-10-CM

## 2019-12-12 ENCOUNTER — Other Ambulatory Visit: Payer: Self-pay

## 2019-12-12 ENCOUNTER — Other Ambulatory Visit: Payer: Self-pay | Admitting: Obstetrics and Gynecology

## 2019-12-12 ENCOUNTER — Ambulatory Visit
Admission: RE | Admit: 2019-12-12 | Discharge: 2019-12-12 | Disposition: A | Discharge status: Home / Self Care | Payer: BC Managed Care – PPO | Source: Ambulatory Visit | Attending: Obstetrics and Gynecology | Admitting: Obstetrics and Gynecology

## 2019-12-12 DIAGNOSIS — R921 Mammographic calcification found on diagnostic imaging of breast: Secondary | ICD-10-CM

## 2019-12-12 DIAGNOSIS — R928 Other abnormal and inconclusive findings on diagnostic imaging of breast: Secondary | ICD-10-CM

## 2019-12-15 ENCOUNTER — Ambulatory Visit
Admission: RE | Admit: 2019-12-15 | Discharge: 2019-12-15 | Disposition: A | Discharge status: Home / Self Care | Payer: BC Managed Care – PPO | Source: Ambulatory Visit | Attending: Obstetrics and Gynecology | Admitting: Obstetrics and Gynecology

## 2019-12-15 ENCOUNTER — Other Ambulatory Visit: Payer: Self-pay

## 2019-12-15 ENCOUNTER — Other Ambulatory Visit: Payer: Self-pay | Admitting: Obstetrics and Gynecology

## 2019-12-15 DIAGNOSIS — R921 Mammographic calcification found on diagnostic imaging of breast: Secondary | ICD-10-CM

## 2019-12-21 ENCOUNTER — Inpatient Hospital Stay: Admission: RE | Admit: 2019-12-21 | Payer: BC Managed Care – PPO | Source: Ambulatory Visit

## 2019-12-26 ENCOUNTER — Other Ambulatory Visit: Payer: Self-pay | Admitting: General Surgery

## 2019-12-26 DIAGNOSIS — D0511 Intraductal carcinoma in situ of right breast: Secondary | ICD-10-CM

## 2019-12-27 ENCOUNTER — Encounter: Payer: Self-pay | Admitting: Adult Health

## 2019-12-27 ENCOUNTER — Other Ambulatory Visit: Payer: Self-pay | Admitting: Genetic Counselor

## 2019-12-27 ENCOUNTER — Telehealth: Payer: Self-pay | Admitting: Hematology and Oncology

## 2019-12-27 ENCOUNTER — Telehealth: Payer: Self-pay | Admitting: Genetic Counselor

## 2019-12-27 DIAGNOSIS — D0511 Intraductal carcinoma in situ of right breast: Secondary | ICD-10-CM | POA: Insufficient documentation

## 2019-12-27 DIAGNOSIS — C50811 Malignant neoplasm of overlapping sites of right female breast: Secondary | ICD-10-CM

## 2019-12-27 NOTE — Telephone Encounter (Signed)
Contacted patient to verify mychart visit for pre reg

## 2019-12-27 NOTE — Telephone Encounter (Signed)
Received new patient referrals from Dr. Marlou Starks for med onc and genetics. Mrs. Stacey Singh has been scheduled to see Dr. Lindi Adie on 6/3 at 845am and virtual visit for genetics on 6/3 w/Emily at 3pm. Lab appt has been scheduled on 6/4 at 830am. Pt aware to arrive 15 minutes early for appt w/Dr. Lindi Adie.

## 2019-12-28 ENCOUNTER — Inpatient Hospital Stay: Payer: BC Managed Care – PPO | Attending: Hematology and Oncology | Admitting: Genetic Counselor

## 2019-12-28 ENCOUNTER — Inpatient Hospital Stay (HOSPITAL_BASED_OUTPATIENT_CLINIC_OR_DEPARTMENT_OTHER): Payer: BC Managed Care – PPO | Admitting: Hematology and Oncology

## 2019-12-28 ENCOUNTER — Other Ambulatory Visit: Payer: Self-pay

## 2019-12-28 DIAGNOSIS — Z170 Estrogen receptor positive status [ER+]: Secondary | ICD-10-CM | POA: Diagnosis not present

## 2019-12-28 DIAGNOSIS — Z803 Family history of malignant neoplasm of breast: Secondary | ICD-10-CM | POA: Diagnosis not present

## 2019-12-28 DIAGNOSIS — D0511 Intraductal carcinoma in situ of right breast: Secondary | ICD-10-CM | POA: Diagnosis not present

## 2019-12-28 DIAGNOSIS — Z806 Family history of leukemia: Secondary | ICD-10-CM | POA: Diagnosis not present

## 2019-12-28 NOTE — Assessment & Plan Note (Addendum)
12/15/2019:Screening mammogram detected new loosely grouped pleomorphic calcifications throughout the right breast.  2 biopsies performed.  UIQ: Intermediate grade DCIS with necrosis, UOQ: Intermediate grade DCIS with necrosis, both are ER 100%, PR 100% Tis NX stage 0  Pathology review: I discussed with the patient the difference between DCIS and invasive breast cancer. It is considered a precancerous lesion. DCIS is classified as a 0. It is generally detected through mammograms as calcifications. We discussed the significance of grades and its impact on prognosis. We also discussed the importance of ER and PR receptors and their implications to adjuvant treatment options. Prognosis of DCIS dependence on grade, comedo necrosis. It is anticipated that if not treated, 20-30% of DCIS can develop into invasive breast cancer.  Recommendation: 1.  Breast MRI to determine the type of surgery breast conserving surgery versus mastectomy 2. Followed by adjuvant radiation therapy if breast conserving surgery is performed 3. Followed by antiestrogen therapy with tamoxifen 5 years  Tamoxifen counseling: We discussed the risks and benefits of tamoxifen. These include but not limited to insomnia, hot flashes, mood changes, vaginal dryness, and weight gain. Although rare, serious side effects including endometrial cancer, risk of blood clots were also discussed. We strongly believe that the benefits far outweigh the risks. Patient understands these risks and consented to starting treatment. Planned treatment duration is 5 years.  Return to clinic after surgery to discuss the final pathology report and come up with an adjuvant treatment plan.

## 2019-12-28 NOTE — Progress Notes (Signed)
[facility] CONSULT NOTE  Patient Care Team: Bernerd Limbo, MD as PCP - General (Family Medicine)  CHIEF COMPLAINTS/PURPOSE OF CONSULTATION:  Newly diagnosed right breast DCIS  HISTORY OF PRESENTING ILLNESS:  Stacey Singh 44 y.o. female is here because of recent diagnosis of right breast DCIS.  Patient with routine screening mammogram detected new calcifications in the right breast and 2 biopsies were performed both revealed intermediate grade DCIS that was ER/PR positive.  She has been referred to [place] for discussion regarding overall care and treatment options for the DCIS.  I reviewed her records extensively and collaborated the history with the patient.  SUMMARY OF ONCOLOGIC HISTORY: Oncology History  Ductal carcinoma in situ (DCIS) of right breast  12/15/2019 Cancer Staging   Staging form: Breast, AJCC 8th Edition - Clinical stage from 12/15/2019: Stage 0 (cTis (DCIS), cN0, cM0, ER+, PR+) - Signed by Gardenia Phlegm, NP on 12/27/2019   12/15/2019 Initial Diagnosis   Screening mammogram detected new loosely grouped pleomorphic calcifications throughout the right breast.  2 biopsies performed.  UIQ: Intermediate grade DCIS with necrosis, UOQ: Intermediate grade DCIS with necrosis, both are ER 100%, PR 100%      MEDICAL HISTORY:  Past Medical History:  Diagnosis Date  . Allergy   . GERD (gastroesophageal reflux disease)   . Heart murmur    in high school, resolved  . Heartburn in pregnancy   . Hypertension    with current pregnancy  . Hypertension   . No pertinent past medical history     SURGICAL HISTORY: Past Surgical History:  Procedure Laterality Date  . CESAREAN SECTION  04/04/2012   Procedure: CESAREAN SECTION;  Surgeon: Farrel Gobble. Harrington Challenger, MD;  Location: [facility];  Service: Obstetrics;  Laterality: N/A;  . CESAREAN SECTION N/A 05/19/2015   Procedure: CESAREAN SECTION;  Surgeon: Allyn Kenner, DO;  Location: [facility];  Service: Obstetrics;   Laterality: N/A;  . CESAREAN SECTION    . DILATION AND CURETTAGE OF UTERUS    . WISDOM TOOTH EXTRACTION      SOCIAL HISTORY: Social History   Socioeconomic History  . Marital status: Married    Spouse name: Not on file  . Number of children: Not on file  . Years of education: Not on file  . Highest education level: Not on file  Occupational History  . Not on file  Tobacco Use  . Smoking status: Never Smoker  . Smokeless tobacco: Never Used  Substance and Sexual Activity  . Alcohol use: Yes  . Drug use: No  . Sexual activity: Not on file  Other Topics Concern  . Not on file  Social History Narrative   ** Merged History Encounter **       Social Determinants of Health   Financial Resource Strain:   . Difficulty of Paying Living Expenses:   Food Insecurity:   . Worried About Charity fundraiser in the Last Year:   . Arboriculturist in the Last Year:   Transportation Needs:   . Film/video editor (Medical):   Marland Kitchen Lack of Transportation (Non-Medical):   Physical Activity:   . Days of Exercise per Week:   . Minutes of Exercise per Session:   Stress:   . Feeling of Stress :   Social Connections:   . Frequency of Communication with Friends and Family:   . Frequency of Social Gatherings with Friends and Family:   . Attends Religious Services:   . Active  Member of Clubs or Organizations:   . Attends Archivist Meetings:   Marland Kitchen Marital Status:   Intimate Partner Violence:   . Fear of Current or Ex-Partner:   . Emotionally Abused:   Marland Kitchen Physically Abused:   . Sexually Abused:     FAMILY HISTORY: Family History  Problem Relation Age of Onset  . Cancer Mother   . Hypertension Mother   . Heart disease Father   . Hypertension Sister   . Hypertension Brother   . Diabetes Brother   . Cancer Maternal Grandmother   . Heart disease Maternal Grandfather     ALLERGIES:  is allergic to penicillins and penicillins.  MEDICATIONS:  Current Outpatient Medications   Medication Sig Dispense Refill  . olmesartan (BENICAR) 40 MG tablet Take 1 tablet (40 mg total) by mouth daily. 90 tablet 3   No current facility-administered medications for this visit.    REVIEW OF SYSTEMS:   Constitutional: Denies fevers, chills or abnormal night sweats Eyes: Denies blurriness of vision, double vision or watery eyes Ears, nose, mouth, throat, and face: Denies mucositis or sore throat Respiratory: Denies cough, dyspnea or wheezes Cardiovascular: Denies palpitation, chest discomfort or lower extremity swelling Gastrointestinal:  Denies nausea, heartburn or change in bowel habits Skin: Denies abnormal skin rashes Lymphatics: Denies new lymphadenopathy or easy bruising Neurological:Denies numbness, tingling or new weaknesses Behavioral/Psych: Mood is stable, no new changes  Breast:  Denies any palpable lumps or discharge All other systems were reviewed with the patient and are negative.  PHYSICAL EXAMINATION: ECOG PERFORMANCE STATUS: 0 - Asymptomatic  Vitals:   12/28/19 0853  BP: (!) 126/91  Pulse: 65  Resp: 17  Temp: 98.9 F (37.2 C)  SpO2: 100%   Filed Weights   12/28/19 0853  Weight: 205 lb 12.8 oz (93.4 kg)    LABORATORY DATA:  I have reviewed the data as listed Lab Results  Component Value Date   WBC 10.0 05/20/2015   HGB 10.7 (L) 05/20/2015   HCT 32.0 (L) 05/20/2015   MCV 92.2 05/20/2015   PLT 179 05/20/2015   Lab Results  Component Value Date   NA 143 12/29/2017   K 4.5 12/29/2017   CL 107 (H) 12/29/2017   CO2 22 12/29/2017    RADIOGRAPHIC STUDIES: I have personally reviewed the radiological reports and agreed with the findings in the report.  ASSESSMENT AND PLAN:  Ductal carcinoma in situ (DCIS) of right breast 12/15/2019:Screening mammogram detected new loosely grouped pleomorphic calcifications throughout the right breast.  2 biopsies performed.  UIQ: Intermediate grade DCIS with necrosis, UOQ: Intermediate grade DCIS with  necrosis, both are ER 100%, PR 100% Tis NX stage 0  Pathology review: I discussed with the patient the difference between DCIS and invasive breast cancer. It is considered a precancerous lesion. DCIS is classified as a 0. It is generally detected through mammograms as calcifications. We discussed the significance of grades and its impact on prognosis. We also discussed the importance of ER and PR receptors and their implications to adjuvant treatment options. Prognosis of DCIS dependence on grade, comedo necrosis. It is anticipated that if not treated, 20-30% of DCIS can develop into invasive breast cancer.  Recommendation: Genetic counseling has been ordered which will be done today. 1.  Breast MRI to determine the type of surgery breast conserving surgery versus mastectomy 2. Followed by adjuvant radiation therapy if breast conserving surgery is performed 3. Followed by antiestrogen therapy with tamoxifen 5 years  Tamoxifen counseling: We  discussed the risks and benefits of tamoxifen. These include but not limited to insomnia, hot flashes, mood changes, vaginal dryness, and weight gain. Although rare, serious side effects including endometrial cancer, risk of blood clots were also discussed. We strongly believe that the benefits far outweigh the risks. Patient understands these risks and consented to starting treatment. Planned treatment duration is 5 years.  Patient works for [employer] and tells me that her employer is extremely supportive.  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  Return to clinic after surgery to discuss the final pathology report and come up with an adjuvant treatment plan.   All questions were answered. The patient knows to call the clinic with any problems, questions or concerns.    Harriette Ohara, MD 12/28/19

## 2019-12-29 ENCOUNTER — Encounter: Payer: Self-pay | Admitting: *Deleted

## 2019-12-29 ENCOUNTER — Inpatient Hospital Stay: Payer: BC Managed Care – PPO

## 2019-12-29 ENCOUNTER — Encounter: Payer: Self-pay | Admitting: Genetic Counselor

## 2019-12-29 ENCOUNTER — Other Ambulatory Visit: Payer: Self-pay

## 2019-12-29 DIAGNOSIS — Z803 Family history of malignant neoplasm of breast: Secondary | ICD-10-CM | POA: Insufficient documentation

## 2019-12-29 DIAGNOSIS — Z170 Estrogen receptor positive status [ER+]: Secondary | ICD-10-CM

## 2019-12-29 DIAGNOSIS — Z806 Family history of leukemia: Secondary | ICD-10-CM | POA: Insufficient documentation

## 2019-12-29 NOTE — Progress Notes (Signed)
REFERRING PROVIDER: Bernerd Limbo, MD [address],  [place] [id number]  PRIMARY PROVIDER:  Bernerd Limbo, MD  PRIMARY REASON FOR VISIT:  1. Ductal carcinoma in situ (DCIS) of right breast   2. Family history of breast cancer   3. Family history of leukemia      I connected with Stacey Singh on 12/28/2019 at 3:00 pm EDT by Mychart video conference and verified that I am speaking with the correct person using two identifiers.   Patient location: Work Provider location: [facility] office  HISTORY OF PRESENT ILLNESS:   Stacey Singh, a 44 y.o. female, was seen for a [place] cancer genetics consultation at the request of Dr. Coletta Memos due to a personal and family history of breast cancer.  Stacey Singh presents to clinic today to discuss the possibility of a hereditary predisposition to cancer, genetic testing, and to further clarify her future cancer risks, as well as potential cancer risks for family members.   In 2021, at the age of 67, Stacey Singh was diagnosed with ductal carcinoma in situ, ER+/PR+, of the right breast. The treatment plan includes MRI, surgery, adjuvant radiation therapy, and antiestrogen therapy.    CANCER HISTORY:  Oncology History  Ductal carcinoma in situ (DCIS) of right breast  12/15/2019 Cancer Staging   Staging form: Breast, AJCC 8th Edition - Clinical stage from 12/15/2019: Stage 0 (cTis (DCIS), cN0, cM0, ER+, PR+) - Signed by Gardenia Phlegm, NP on 12/27/2019   12/15/2019 Initial Diagnosis   Screening mammogram detected new loosely grouped pleomorphic calcifications throughout the right breast.  2 biopsies performed.  UIQ: Intermediate grade DCIS with necrosis, UOQ: Intermediate grade DCIS with necrosis, both are ER 100%, PR 100%      RISK FACTORS:  Menarche was at age 71.  First live birth at age 32.  OCP use for approximately 5-7 years.  Ovaries intact: yes.  Hysterectomy: no.  Menopausal status: premenopausal.  HRT use: 0  years. Colonoscopy: n/a; not examined. Mammogram within the last year: yes. Number of breast biopsies: 2. Any excessive radiation exposure in the past: no   Past Medical History:  Diagnosis Date  . Allergy   . Family history of breast cancer   . Family history of leukemia   . GERD (gastroesophageal reflux disease)   . Heart murmur    in high school, resolved  . Heartburn in pregnancy   . Hypertension    with current pregnancy  . Hypertension   . No pertinent past medical history     Past Surgical History:  Procedure Laterality Date  . CESAREAN SECTION  04/04/2012   Procedure: CESAREAN SECTION;  Surgeon: Farrel Gobble. Harrington Challenger, MD;  Location: [facility];  Service: Obstetrics;  Laterality: N/A;  . CESAREAN SECTION N/A 05/19/2015   Procedure: CESAREAN SECTION;  Surgeon: Allyn Kenner, DO;  Location: [facility];  Service: Obstetrics;  Laterality: N/A;  . CESAREAN SECTION    . DILATION AND CURETTAGE OF UTERUS    . WISDOM TOOTH EXTRACTION      Social History   Socioeconomic History  . Marital status: Married    Spouse name: Not on file  . Number of children: Not on file  . Years of education: Not on file  . Highest education level: Not on file  Occupational History  . Not on file  Tobacco Use  . Smoking status: Never Smoker  . Smokeless tobacco: Never Used  Substance and Sexual Activity  . Alcohol use: Yes  .  Drug use: No  . Sexual activity: Not on file  Other Topics Concern  . Not on file  Social History Narrative   ** Merged History Encounter **       Social Determinants of Health   Financial Resource Strain:   . Difficulty of Paying Living Expenses:   Food Insecurity:   . Worried About Charity fundraiser in the Last Year:   . Arboriculturist in the Last Year:   Transportation Needs:   . Film/video editor (Medical):   Marland Kitchen Lack of Transportation (Non-Medical):   Physical Activity:   . Days of Exercise per Week:   . Minutes of Exercise per Session:   Stress:   .  Feeling of Stress :   Social Connections:   . Frequency of Communication with Friends and Family:   . Frequency of Social Gatherings with Friends and Family:   . Attends Religious Services:   . Active Member of Clubs or Organizations:   . Attends Archivist Meetings:   Marland Kitchen Marital Status:      FAMILY HISTORY:  We obtained a detailed, 4-generation family history.  Significant diagnoses are listed below: Family History  Problem Relation Age of Onset  . Hypertension Mother   . Breast cancer Mother 23       negative genetic testing  . Heart disease Father   . Hypertension Sister   . Hypertension Brother   . Diabetes Brother   . Breast cancer Maternal Grandmother        dx. in her 98s  . Heart disease Maternal Grandfather   . Heart Problems Paternal Grandmother   . Heart Problems Paternal Grandfather   . Breast cancer Paternal Aunt        dx. in her 50s  . Breast cancer Other        dx. in her 53s or 29s (MGM's sister)  . Kidney disease Maternal Uncle   . Leukemia Paternal Aunt 18   Stacey Singh has one son (age 51) and one daughter (age 86). She has one sister (age 29) and one brother (age 62). None of these family members have had cancer.  Stacey Singh's mother died at the age of 72 from breast cancer and had negative genetic testing (results not available for review today). Stacey Singh had two maternal uncles - one who died at the age of 62 from kidney disease, and the other who is in his late 92s. Her maternal grandmother died at the age of 63 and had a history of breast cancer in her 32s. Her grandmother had a sister who also had breast cancer diagnosed in her 51s or 63s. Stacey Singh's maternal grandfather died in his 74s from a heart attack.  Stacey Singh's father died at the age of 56 from heart problems. She had two paternal aunts - one had breast cancer diagnosed in her 86s and the other died from leukemia at the age of 19. Stacey Singh's paternal grandmother died at the  age of 48 from heart problems, and her paternal grandfather died in his late 46s from heart problems.  Stacey Singh is aware of previous family history of genetic testing for hereditary cancer risks in her mother, which was negative. Her maternal ancestors are of [place] descent, and paternal ancestors are of [place] and [place] descent. There is reported Ashkenazi Jewish ancestry on her maternal side of the family (both grandparents). There is no known consanguinity.  GENETIC COUNSELING ASSESSMENT: Ms.  Singh is a 44 y.o. female with a personal history of young-onset breast cancer and a family history of breast cancer, which is somewhat suggestive of a hereditary cancer syndrome and predisposition to cancer. We, therefore, discussed and recommended the following at today's visit.   DISCUSSION: We discussed that 5-10% of breast cancer is hereditary, with most cases associated with the BRCA1 and BRCA2 genes. There are other genes that can be associated with hereditary breast cancer syndromes. These include ATM, CHEK2, PALB2, etc. We discussed that testing is beneficial for several reasons, including knowing about other cancer risks, identifying potential screening and risk-reduction options that may be appropriate, and to understand if other family members could be at risk for cancer and allow them to undergo genetic testing.   We reviewed the characteristics, features and inheritance patterns of hereditary cancer syndromes. We also discussed genetic testing, including the appropriate family members to test, the process of testing, insurance coverage and turn-around-time for results. We discussed the implications of a negative, positive and/or variant of uncertain significant result. In order to get genetic test results in a timely manner so that Stacey Singh can use these genetic test results for surgical decisions, we recommended Stacey Singh pursue genetic testing for the [facility] Breast Cancer STAT  Panel. Once complete, she may consider reflex genetic testing to a larger hereditary cancer gene panel. The Breast Cancer STAT Panel offered by [facility] includes sequencing and deletion/duplication analysis for the following 9 genes:  ATM, BRCA1, BRCA2, CDH1, CHEK2, PALB2, PTEN, STK11 and TP53.     Based on Stacey Singh's personal and family history of cancer, she meets medical criteria for genetic testing. Despite that she meets criteria, she may still have an out of pocket cost.   PLAN: After considering the risks, benefits, and limitations, Stacey Singh provided informed consent to pursue genetic testing and the blood sample was sent to [facility] for analysis of the Breast Cancer STAT panel. Results should be available within approximately one-two weeks' time, at which point they will be disclosed by telephone to Stacey Singh, as will any additional recommendations warranted by these results. Stacey Singh will receive a summary of her genetic counseling visit and a copy of her results once available. This information will also be available in Epic.   Ms. Chesmore questions were answered to her satisfaction today. Our contact information was provided should additional questions or concerns arise. Thank you for the referral and allowing [place] to share in the care of your patient.   Clint Guy, [place], [facility] Licensed, Certified Dispensing [email or web address] Phone: [phone]  The patient was seen for a total of 40 minutes in face-to-face genetic counseling.  This patient was discussed with Drs. Magrinat, Lindi Adie and/or Burr Medico who agrees with the above.    _______________________________________________________________________ For Office Staff:  Number of people involved in session: 1 Was an Intern/ student involved with case: no

## 2020-01-01 ENCOUNTER — Telehealth: Payer: Self-pay | Admitting: Radiation Oncology

## 2020-01-01 LAB — GENETIC SCREENING ORDER

## 2020-01-01 NOTE — Progress Notes (Signed)
Location of Breast Cancer: Intraductal carcinoma in situ of right breast  Did patient present with symptoms (if so, please note symptoms) or was this found on screening mammography?: Routine mammogram  Mammogram: scattered calcifications in the right breast.  Two small distinct clusters of calcifications around 1 cm or so in both the upper outer and upper inner quadrant.    MRI Breast 01/05/2020:  Histology per Pathology Report: Right Breast Biopsy 12/15/2019   Receptor Status: ER(100% +), PR (100% +), Her2-neu (), Ki-()    Past/Anticipated interventions by surgeon, if any: Dr. Marlou Starks 12/21/2019 -The patient appears to have 2 small areas of DCIS in the UIQ and UOQ of right breast.   -At this point because of the intractability of DCIS in the question of scattered calcifications in the breast we will plan to evaluate her with an MRI of both breasts. -If this looks like 2 small areas of DCIS then she seems to be favoring breast conservation.  In this case she would not need a node evaluation. -If the area looks like a much larger confluence of DCIS then she may have to consider mastectomy to remove the area in which case we would also do the node evaluation at the same time. -Awaiting MRI to determine treatment plan.   Past/Anticipated interventions by medical oncology, if any: Chemotherapy    Lymphedema issues, if any:  No  Pain issues, if any:  No  SAFETY ISSUES:  Prior radiation? No  Pacemaker/ICD? No  Possible current pregnancy? Having Periods, No birth control.   Is the patient on methotrexate? No  Current Complaints / other details:      Cori Razor, RN 01/01/2020,1:48 PM

## 2020-01-01 NOTE — Telephone Encounter (Signed)
Contacted patient to verify virtual visit for pre reg

## 2020-01-02 ENCOUNTER — Telehealth: Payer: Self-pay | Admitting: *Deleted

## 2020-01-02 ENCOUNTER — Ambulatory Visit
Admission: RE | Admit: 2020-01-02 | Discharge: 2020-01-02 | Disposition: A | Discharge status: Home / Self Care | Payer: BC Managed Care – PPO | Source: Ambulatory Visit | Attending: Radiation Oncology | Admitting: Radiation Oncology

## 2020-01-02 ENCOUNTER — Encounter: Payer: Self-pay | Admitting: Radiation Oncology

## 2020-01-02 ENCOUNTER — Other Ambulatory Visit: Payer: Self-pay

## 2020-01-02 ENCOUNTER — Telehealth: Payer: Self-pay | Admitting: Genetic Counselor

## 2020-01-02 VITALS — Ht 63.0 in | Wt 205.0 lb

## 2020-01-02 DIAGNOSIS — D0511 Intraductal carcinoma in situ of right breast: Secondary | ICD-10-CM

## 2020-01-02 NOTE — Telephone Encounter (Signed)
Called to provide Stacey Singh the CPT code for the genetic testing: [place]. She plans to reach out to insurance to learn more about her expected cost for genetic testing and will call back if she has any other questions.

## 2020-01-02 NOTE — Telephone Encounter (Signed)
Pt called asking if genetic testing has been authorized. Informed pt that I would reach out to [facility] regarding the process for getting genetic testing authorized. Received verbal understanding. Msg sent to [facility].

## 2020-01-02 NOTE — Progress Notes (Signed)
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  Cancer Staging Ductal carcinoma in situ (DCIS) of right breast Staging form: Breast, AJCC 8th Edition - Clinical stage from 12/15/2019: Stage 0 (cTis (DCIS), cN0, cM0, ER+, PR+) - Signed by Gardenia Phlegm, NP on 12/27/2019    HISTORY OF PRESENT ILLNESS::Stacey Singh is a 44 y.o. female who is seen for an initial consultation visit regarding the patient's diagnosis of DCIS of the right breast.  The patient presented was originally with suspicious calcifications on screening mammogram. A diagnostic mammogram confirmed this finding and a stereotactic biopsy of this area was performed. This returned positive for DCIS. Receptors studies have been performed and the tumor is estrogen receptor positive and progesterone receptor positive.  The patient will undergo an MRI scan of the breasts and make a final decision with surgery in terms of surgical approach, lumpectomy versus mastectomy.  We discussed the impact of this decision on the typical role for radiation treatment in the setting of DCIS.    PREVIOUS RADIATION THERAPY: No   PAST MEDICAL HISTORY:  has a past medical history of Allergy, Family history of breast cancer, Family history of leukemia, GERD (gastroesophageal reflux disease), Heart murmur, Heartburn in pregnancy, Hypertension, Hypertension, and No pertinent past medical history.     PAST SURGICAL HISTORY: Past Surgical History:  Procedure Laterality Date  . CESAREAN SECTION  04/04/2012   Procedure: CESAREAN SECTION;  Surgeon: Farrel Gobble. Harrington Challenger, MD;  Location: [facility];  Service: Obstetrics;  Laterality: N/A;  . CESAREAN SECTION N/A 05/19/2015   Procedure: CESAREAN SECTION;  Surgeon: Allyn Kenner, DO;  Location: [facility];  Service: Obstetrics;  Laterality: N/A;  . CESAREAN SECTION    . DILATION AND CURETTAGE OF UTERUS    . WISDOM TOOTH EXTRACTION       FAMILY HISTORY: family history includes Breast cancer in her maternal grandmother, paternal aunt, and another family member; Breast cancer (age of onset: 97) in her mother; Diabetes in her brother; Heart Problems in her paternal grandfather and paternal grandmother; Heart disease in her father and maternal grandfather; Hypertension in her brother, mother, and sister; Kidney disease in her maternal uncle; Leukemia (age of onset: 46) in her paternal aunt.   SOCIAL HISTORY:  reports that she has never smoked. She has never used smokeless tobacco. She reports previous alcohol use. She reports that she does not use drugs.   ALLERGIES: Penicillins and Penicillins   MEDICATIONS:  Current Outpatient Medications  Medication Sig Dispense Refill  . olmesartan (BENICAR) 40 MG tablet Take 1 tablet (40 mg total) by mouth daily. 90 tablet 3  . Multiple Vitamin (ONE DAILY) tablet Take by mouth.     No current facility-administered medications for this encounter.     REVIEW OF SYSTEMS:  A 15 point review of systems is documented in the electronic medical record. This was obtained by the nursing staff. However, I reviewed this with the patient to discuss relevant findings and make appropriate changes.  Pertinent items are noted in HPI.    PHYSICAL EXAM:  height is 5' 3"  (1.6 m) and weight is 205 lb (93 kg).   ECOG = 0  0 - Asymptomatic (Fully  active, able to carry on all predisease activities without restriction)  1 - Symptomatic but completely ambulatory (Restricted in physically strenuous activity but ambulatory and able to carry out work of a light or sedentary nature. For example, light housework, office work)  2 - Symptomatic, <50% in bed during the day (Ambulatory and capable of all  self care but unable to carry out any work activities. Up and about more than 50% of waking hours)  3 - Symptomatic, >50% in bed, but not bedbound (Capable of only limited self-care, confined to bed or chair 50% or more of waking hours)  4 - Bedbound (Completely disabled. Cannot carry on any self-care. Totally confined to bed or chair)  5 - Death   Eustace Pen MM, Creech RH, Tormey DC, et al. [phone]). "Toxicity and response criteria of the [facility]". [place] Oncol. 5 (6): 649-55  General: Well-developed, in no acute distress     LABORATORY DATA:  Lab Results  Component Value Date   WBC 10.0 05/20/2015   HGB 10.7 (L) 05/20/2015   HCT 32.0 (L) 05/20/2015   MCV 92.2 05/20/2015   PLT 179 05/20/2015   Lab Results  Component Value Date   NA 143 12/29/2017   K 4.5 12/29/2017   CL 107 (H) 12/29/2017   CO2 22 12/29/2017   Lab Results  Component Value Date   ALT 24 12/29/2017   AST 21 12/29/2017   ALKPHOS 66 12/29/2017   BILITOT 0.2 12/29/2017      RADIOGRAPHY: MM Digital Diagnostic Unilat R  Addendum Date: 12/18/2019   ADDENDUM REPORT: 12/18/2019 14:13 ADDENDUM: Addendum for correction of findings: The 1.3 cm group of calcifications referenced within the findings section incorrectly stated that they were in the medial aspect of the left breast. These branching calcifications are located within the medial aspect of the right breast. All findings relative to this exam are located within the right breast. Electronically Signed   By: Lovey Newcomer M.D.   On: 12/18/2019 14:13   Result Date: 12/18/2019 CLINICAL DATA:  Patient recalled from screening for right breast calcifications. EXAM: DIGITAL DIAGNOSTIC RIGHT MAMMOGRAM WITH CAD COMPARISON:  Previous exam(s). ACR Breast Density Category b: There are scattered areas of fibroglandular density. FINDINGS: Magnification CC and true lateral views of the right breast were obtained. There are new loosely grouped  pleomorphic calcifications demonstrated throughout the right breast. These are better demonstrated on the magnification views. There is a 1.3 cm group within the medial left breast with branching calcifications. Mammographic images were processed with CAD. IMPRESSION: Indeterminate new suspicious calcifications throughout the right breast. RECOMMENDATION: Stereotactic guided core needle biopsy 2 groups of calcifications within the right breast. Recommend biopsy of the fine linear branching calcifications within the medial right breast and a different group within the outer aspect of the right breast. If these demonstrate malignancy, breast MRI is warranted. I have discussed the findings and recommendations with the patient. If applicable, a reminder letter will be sent to the patient regarding the next appointment. BI-RADS CATEGORY  4: Suspicious. Electronically Signed: By: Lovey Newcomer M.D. On: 12/12/2019 16:25   MM CLIP PLACEMENT RIGHT  Result Date: 12/15/2019 CLINICAL DATA:  44 year old female presenting for biopsy of right breast calcifications. EXAM: DIAGNOSTIC RIGHT MAMMOGRAM POST STEREOTACTIC BIOPSY COMPARISON:  Previous exam(s). FINDINGS: Mammographic images were obtained following stereotactic guided biopsy of calcifications in the upper outer right breast. The X biopsy marking clip is in expected position at the site of biopsy. Mammographic  images were obtained following stereotactic guided biopsy of calcifications in the upper inner right breast. The coil biopsy marking clip is in expected position at the site of biopsy. IMPRESSION: Appropriate positioning of the X shaped biopsy marking clip at the site of biopsy in the upper outer right breast. Appropriate positioning of the coil shaped biopsy marking clip at the site of biopsy in the upper inner right breast. Final Assessment: Post Procedure Mammograms for Marker Placement Electronically Signed   By: Audie Pinto M.D.   On: 12/15/2019 15:25    MM RT BREAST BX W LOC DEV 1ST LESION IMAGE BX SPEC STEREO GUIDE  Addendum Date: 12/18/2019   ADDENDUM REPORT: 12/18/2019 12:52 ADDENDUM: Pathology revealed INTERMEDIATE GRADE DUCTAL CARCINOMA IN SITU WITH CENTRAL NECROSIS AND CALCIFICATIONS of the Right breast, both locations, upper inner and upper outer. This was found to be concordant by Dr. Audie Pinto. Pathology results were discussed with the patient by telephone. The patient reported doing well after the biopsies with tenderness and minimal bleeding at the sites. Post biopsy instructions and care were reviewed and questions were answered. The patient was encouraged to call [facility] for any additional concerns. Surgical consultation has been arranged with Dr. Autumn Messing at [facility] on Dec 21, 2019. Recommendation for bilateral breast MRI for further evaluation of extent of disease. Pathology results reported by Terie Purser, RN on 12/18/2019. Electronically Signed   By: Audie Pinto M.D.   On: 12/18/2019 12:52   Result Date: 12/18/2019 CLINICAL DATA:  44 year old female presenting for biopsy of right breast calcifications. EXAM: RIGHT BREAST STEREOTACTIC CORE NEEDLE BIOPSY COMPARISON:  Previous exams. FINDINGS: The patient and I discussed the procedure of stereotactic-guided biopsy including benefits and alternatives. We discussed the high likelihood of a successful procedure. We discussed the risks of the procedure including infection, bleeding, tissue injury, clip migration, and inadequate sampling. Informed written consent was given. The usual time out protocol was performed immediately prior to the procedure. 1. Using sterile technique and 1% Lidocaine as local anesthetic, under stereotactic guidance, a 9 gauge vacuum assisted device was used to perform core needle biopsy of calcifications in the upper inner quadrant of the right breast using a superior approach. Specimen radiograph was  performed showing at least 6 specimens with calcifications. Specimens with calcifications are identified for pathology. Lesion quadrant: Upper inner quadrant At the conclusion of the procedure, a coil tissue marker clip was deployed into the biopsy cavity. Follow-up 2-view mammogram was performed and dictated separately. 2. Using sterile technique and 1% Lidocaine as local anesthetic, under stereotactic guidance, a 9 gauge vacuum assisted device was used to perform core needle biopsy of calcifications in the upper-outer quadrant of the right breast using a superior approach. Specimen radiograph was performed showing at least 2 specimens with calcifications. Specimens with calcifications are identified for pathology. Lesion quadrant: Upper outer quadrant At the conclusion of the procedure, an X tissue marker clip was deployed into the biopsy cavity. Follow-up 2-view mammogram was performed and dictated separately. IMPRESSION: Stereotactic-guided biopsy of calcifications in the upper outer and upper inner quadrants of the right breast. No apparent complications. Electronically Signed: By: Audie Pinto M.D. On: 12/15/2019 15:29   MM RT BREAST BX W LOC DEV EA AD LESION IMG BX SPEC STEREO GUIDE  Addendum Date: 12/18/2019   ADDENDUM REPORT: 12/18/2019 12:52 ADDENDUM: Pathology revealed INTERMEDIATE GRADE DUCTAL CARCINOMA IN SITU WITH CENTRAL NECROSIS AND CALCIFICATIONS of the Right breast, both locations, upper  inner and upper outer. This was found to be concordant by Dr. Audie Pinto. Pathology results were discussed with the patient by telephone. The patient reported doing well after the biopsies with tenderness and minimal bleeding at the sites. Post biopsy instructions and care were reviewed and questions were answered. The patient was encouraged to call [facility] for any additional concerns. Surgical consultation has been arranged with Dr. Autumn Messing at [facility] on Dec 21, 2019. Recommendation for bilateral breast MRI for further evaluation of extent of disease. Pathology results reported by Terie Purser, RN on 12/18/2019. Electronically Signed   By: Audie Pinto M.D.   On: 12/18/2019 12:52   Result Date: 12/18/2019 CLINICAL DATA:  44 year old female presenting for biopsy of right breast calcifications. EXAM: RIGHT BREAST STEREOTACTIC CORE NEEDLE BIOPSY COMPARISON:  Previous exams. FINDINGS: The patient and I discussed the procedure of stereotactic-guided biopsy including benefits and alternatives. We discussed the high likelihood of a successful procedure. We discussed the risks of the procedure including infection, bleeding, tissue injury, clip migration, and inadequate sampling. Informed written consent was given. The usual time out protocol was performed immediately prior to the procedure. 1. Using sterile technique and 1% Lidocaine as local anesthetic, under stereotactic guidance, a 9 gauge vacuum assisted device was used to perform core needle biopsy of calcifications in the upper inner quadrant of the right breast using a superior approach. Specimen radiograph was performed showing at least 6 specimens with calcifications. Specimens with calcifications are identified for pathology. Lesion quadrant: Upper inner quadrant At the conclusion of the procedure, a coil tissue marker clip was deployed into the biopsy cavity. Follow-up 2-view mammogram was performed and dictated separately. 2. Using sterile technique and 1% Lidocaine as local anesthetic, under stereotactic guidance, a 9 gauge vacuum assisted device was used to perform core needle biopsy of calcifications in the upper-outer quadrant of the right breast using a superior approach. Specimen radiograph was performed showing at least 2 specimens with calcifications. Specimens with calcifications are identified for pathology. Lesion quadrant: Upper outer quadrant At the conclusion of the procedure, an X  tissue marker clip was deployed into the biopsy cavity. Follow-up 2-view mammogram was performed and dictated separately. IMPRESSION: Stereotactic-guided biopsy of calcifications in the upper outer and upper inner quadrants of the right breast. No apparent complications. Electronically Signed: By: Audie Pinto M.D. On: 12/15/2019 15:29       IMPRESSION:  Oncology History  Ductal carcinoma in situ (DCIS) of right breast  12/15/2019 Cancer Staging   Staging form: Breast, AJCC 8th Edition - Clinical stage from 12/15/2019: Stage 0 (cTis (DCIS), cN0, cM0, ER+, PR+) - Signed by Gardenia Phlegm, NP on 12/27/2019   12/15/2019 Initial Diagnosis   Screening mammogram detected new loosely grouped pleomorphic calcifications throughout the right breast.  2 biopsies performed.  UIQ: Intermediate grade DCIS with necrosis, UOQ: Intermediate grade DCIS with necrosis, both are ER 100%, PR 100%     The patient has a recent diagnosis of DCIS of the right breast. She appears to potentially be a good candidate for breast conservation treatment, although an MRI scan is pending and this will clarify whether this is in fact true.  Alternatively, the patient understands that she may require a mastectomy given further work-up.  I discussed with the patient the role of adjuvant radiation treatment in this setting. We discussed the potential benefit of radiation treatment, especially with regards to local control of the patient's tumor if  the patient does proceed with breast conservation treatment. We also discussed the possible side effects and risks of such a treatment as well.  All of the patient's questions were answered. The patient wishes to proceed with radiation treatment at the appropriate time, if she does undergo a lumpectomy.  She also knows that even in the setting of mastectomy we do want to see the final results of surgery to ensure that there is not a role although this is typically not the  case.  PLAN: I look forward to seeing the patient postoperatively as appropriate to review her case and further discuss and coordinate an anticipated course of radiation treatment.    Due to the coronavirus pandemic, this encounter was provided by telemedicine platform MyChart.  The patient has given verbal consent for this type of encounter and has been advised to only accept a meeting of this type in a secure network environment. The time spent during this encounter was 40 minutes, including medical chart review, video conference with the patient, and coordination of care The attendants for this meeting include  Dr. Lisbeth Renshaw, Blenda Nicely, the patient, and the patient's husband.  During the encounter,  Dr. Lisbeth Renshaw, and was located at [facility] Radiation Oncology Department.  The patient was located at home.     ________________________________   Jodelle Gross, MD, PhD   **Disclaimer: This note was dictated with voice recognition software. Similar sounding words can inadvertently be transcribed and this note may contain transcription errors which may not have been corrected upon publication of note.**

## 2020-01-03 ENCOUNTER — Telehealth: Payer: Self-pay | Admitting: Genetic Counselor

## 2020-01-03 DIAGNOSIS — Z86000 Personal history of in-situ neoplasm of breast: Secondary | ICD-10-CM | POA: Insufficient documentation

## 2020-01-03 NOTE — Telephone Encounter (Signed)
Discussed payment options for genetic testing. Per the genetic testing laboratory ([facility]), they have received insurance approval but there is still an out of pocket cost since she has not yet met her deductible. We discussed the patient assistance program offered by [facility], which may offer a discounted cost for genetic testing based on her household size and income. Stacey Singh understands her options and feels that she will likely select the self-pay option of $250.

## 2020-01-05 ENCOUNTER — Ambulatory Visit: Payer: Self-pay | Admitting: Genetic Counselor

## 2020-01-05 ENCOUNTER — Ambulatory Visit
Admission: RE | Admit: 2020-01-05 | Discharge: 2020-01-05 | Disposition: A | Discharge status: Home / Self Care | Payer: BC Managed Care – PPO | Source: Ambulatory Visit | Attending: General Surgery | Admitting: General Surgery

## 2020-01-05 ENCOUNTER — Other Ambulatory Visit: Payer: Self-pay

## 2020-01-05 ENCOUNTER — Encounter: Payer: Self-pay | Admitting: Genetic Counselor

## 2020-01-05 ENCOUNTER — Telehealth: Payer: Self-pay | Admitting: Genetic Counselor

## 2020-01-05 DIAGNOSIS — Z1379 Encounter for other screening for genetic and chromosomal anomalies: Secondary | ICD-10-CM

## 2020-01-05 DIAGNOSIS — D0511 Intraductal carcinoma in situ of right breast: Secondary | ICD-10-CM

## 2020-01-05 MED ORDER — GADOBUTROL 1 MMOL/ML IV SOLN
9.0000 mL | Freq: Once | INTRAVENOUS | Status: AC | PRN
  Administered 2020-01-05: 9 mL via INTRAVENOUS

## 2020-01-05 NOTE — Telephone Encounter (Signed)
Revealed negative genetic testing. Discussed that we do not know why she has breast cancer or why there is cancer in the family. It is possible that there could be a mutation in a different gene that we are not testing, or our current technology may not be able detect certain mutations. It will therefore be important for her to stay in contact with genetics to keep up with whether additional testing may be appropriate in the future.

## 2020-01-05 NOTE — Progress Notes (Signed)
HPI:  Ms. Kleckley was previously seen in the [facility] due to a personal and family history of cancer and concerns regarding a hereditary predisposition to cancer. Please refer to our prior cancer genetics clinic note for more information regarding our discussion, assessment and recommendations, at the time. Ms. Blansett recent genetic test results were disclosed to her, as were recommendations warranted by these results. These results and recommendations are discussed in more detail below.  CANCER HISTORY:  Oncology History  Ductal carcinoma in situ (DCIS) of right breast  12/15/2019 Cancer Staging   Staging form: Breast, AJCC 8th Edition - Clinical stage from 12/15/2019: Stage 0 (cTis (DCIS), cN0, cM0, ER+, PR+) - Signed by Gardenia Phlegm, NP on 12/27/2019   12/15/2019 Initial Diagnosis   Screening mammogram detected new loosely grouped pleomorphic calcifications throughout the right breast.  2 biopsies performed.  UIQ: Intermediate grade DCIS with necrosis, UOQ: Intermediate grade DCIS with necrosis, both are ER 100%, PR 100%   01/05/2020 Genetic Testing   Negative genetic testing:  No pathogenic variants detected on the [facility] Breast Cancer STAT panel. The report date is 01/05/2020.  The Breast Cancer STAT Panel offered by [facility] includes sequencing and deletion/duplication analysis for the following 9 genes:  ATM, BRCA1, BRCA2, CDH1, CHEK2, PALB2, PTEN, STK11 and TP53.     FAMILY HISTORY:  We obtained a detailed, 4-generation family history.  Significant diagnoses are listed below: Family History  Problem Relation Age of Onset  . Hypertension Mother   . Breast cancer Mother 6       negative genetic testing  . Heart disease Father   . Hypertension Sister   . Hypertension Brother   . Diabetes Brother   . Breast cancer Maternal Grandmother        dx. in her 99s  . Heart disease Maternal Grandfather   . Heart Problems Paternal Grandmother   . Heart  Problems Paternal Grandfather   . Breast cancer Paternal Aunt        dx. in her 36s  . Breast cancer Other        dx. in her 21s or 60s (MGM's sister)  . Kidney disease Maternal Uncle   . Leukemia Paternal Aunt 61   Ms. Christians has one son (age 58) and one daughter (age 27). She has one sister (age 57) and one brother (age 17). None of these family members have had cancer.  Ms. Rager's mother died at the age of 6 from breast cancer and had negative genetic testing. Ms. Rennie had two maternal uncles - one who died at the age of 9 from kidney disease, and the other who is in his late 11s. Her maternal grandmother died at the age of 14 and had a history of breast cancer in her 61s. Her grandmother had a sister who also had breast cancer diagnosed in her 19s or 21s. Ms. Benish's maternal grandfather died in his 90s from a heart attack.  Ms. Codner's father died at the age of 23 from heart problems. She had two paternal aunts - one had breast cancer diagnosed in her 53s and the other died from leukemia at the age of 64. Ms. Derksen's paternal grandmother died at the age of 52 from heart problems, and her paternal grandfather died in his late 43s from heart problems.  Ms. Salatino is aware of previous family history of genetic testing for hereditary cancer risks in her mother via the [facility] myrisk gene panel, which was  negative. Her maternal ancestors are of [place] descent, and paternal ancestors are of [place] and [place] descent. There is reported Ashkenazi Jewish ancestry on her maternal side of the family (both grandparents). There is no known consanguinity.  GENETIC TEST RESULTS: Genetic testing reported out on 01/05/2020 through the [facility] Breast Cancer STAT panel. No pathogenic variants were detected.   The Breast Cancer STAT Panel offered by [facility] includes sequencing and deletion/duplication analysis for the following 9 genes:  ATM, BRCA1, BRCA2, CDH1, CHEK2, PALB2, PTEN, STK11  and TP53. The test report will be scanned into EPIC and located under the Molecular Pathology section of the Results Review tab.  A portion of the result report is included below for reference.     We discussed with Ms. Florer that because current genetic testing is not perfect, it is possible there may be a gene mutation in one of these genes that current testing cannot detect, but that chance is small.  We also discussed, that there could be another gene that has not yet been discovered, or that we have not yet tested, that is responsible for the cancer diagnoses in the family. It is also possible there is a hereditary cause for the cancer in the family that Ms. Picinich did not inherit and therefore was not identified in her testing.  Therefore, it is important to remain in touch with cancer genetics in the future so that we can continue to offer Ms. Kapusta the most up to date genetic testing.   ADDITIONAL GENETIC TESTING: We discussed with Ms. Fishbaugh that there are other genes that are associated with increased cancer risk that can be analyzed. Should Ms. Marconi wish to pursue additional genetic testing, we are happy to discuss and coordinate this testing, at any time. Ms. Steinhart is not sure if she wants to pursue additional testing at this time. [facility] will allow testing for additional cancer genes if requested with 150 days of the original report date, free of charge. We will reach out to Ms. Chaudhary in approximately two months to revisit the possibility of additional testing.  CANCER SCREENING RECOMMENDATIONS: Ms. Epting's test result is considered negative (normal).  This means that we have not identified a hereditary cause for her personal and family history of cancer at this time. While reassuring, this does not definitively rule out a hereditary predisposition to cancer. It is still possible that there could be genetic mutations that are undetectable by current technology. There could be  genetic mutations in genes that have not been tested or identified to increase cancer risk.  Therefore, it is recommended she continue to follow the cancer management and screening guidelines provided by her oncology and primary healthcare providers.   An individual's cancer risk and medical management are not determined by genetic test results alone. Overall cancer risk assessment incorporates additional factors, including personal medical history, family history, and any available genetic information that may result in a personalized plan for cancer prevention and surveillance.  RECOMMENDATIONS FOR FAMILY MEMBERS:  Individuals in this family might be at some increased risk of developing cancer, over the general population risk, simply due to the family history of cancer.  We recommended women in this family have a yearly mammogram beginning at age 64, or 56 years younger than the earliest onset of cancer, an annual clinical breast exam, and perform monthly breast self-exams. Women in this family should also have a gynecological exam as recommended by their primary provider. All family members should have a  colonoscopy by age 28.  FOLLOW-UP: Lastly, we discussed with Ms. Noguera that cancer genetics is a rapidly advancing field and it is possible that new genetic tests will be appropriate for her and/or her family members in the future. We encouraged her to remain in contact with cancer genetics on an annual basis so we can update her personal and family histories and let her know of advances in cancer genetics that may benefit this family.   Our contact number was provided. Ms. Lockner questions were answered to her satisfaction, and she knows she is welcome to call us at anytime with additional questions or concerns.   Clint Guy, MS, [facility] Genetic Counselor [email or web address] Phone: [phone]

## 2020-01-08 ENCOUNTER — Encounter: Payer: Self-pay | Admitting: Licensed Clinical Social Worker

## 2020-01-08 ENCOUNTER — Encounter: Payer: Self-pay | Admitting: *Deleted

## 2020-01-08 NOTE — Progress Notes (Signed)
[place] Psychosocial Distress Screening Clinical Social Work  Clinical Social Work was referred by distress screening protocol.  The patient scored a 7 on the Psychosocial Distress Thermometer which indicates moderate distress. Clinical Social Worker contacted patient by phone to assess for distress and other psychosocial needs.    Patient became tearful on the phone and stated that she is trying to stay positive and grateful that it was caught early, but she is still scared. Her mother died 2 years ago from breast cancer after not going for treatment. Patient also has 2 young children (17yo boy, 62yo girl). CSW helped patient brainstorm how to talk with them about what is happening. Patient is trying to use gratitude, running, time with her kids, and distraction to cope. She has good support from husband, husband's family, and friends. She works for [employer] and is receiving understanding from work as well.  CSW and patient discussed common feeling and emotions when being diagnosed with cancer, and the importance of support during treatment.  CSW informed patient of the support team and support services at [facility].  CSW provided contact information and encouraged patient to call with any questions or concerns.  ONCBCN DISTRESS SCREENING 01/02/2020  Screening Type Initial Screening  Distress experienced in past week (1-10) 7  Emotional problem type Adjusting to illness  Other Contact via phone    Clinical Social Worker follow up needed: Yes.    If yes, follow up plan:CSW will continue to follow periodically for emotional support   Stacey Singh E, LCSW

## 2020-01-09 ENCOUNTER — Other Ambulatory Visit: Payer: Self-pay | Admitting: General Surgery

## 2020-01-09 ENCOUNTER — Encounter: Payer: Self-pay | Admitting: *Deleted

## 2020-01-09 DIAGNOSIS — R9389 Abnormal findings on diagnostic imaging of other specified body structures: Secondary | ICD-10-CM

## 2020-01-11 ENCOUNTER — Other Ambulatory Visit: Payer: Self-pay

## 2020-01-11 ENCOUNTER — Ambulatory Visit: Payer: BC Managed Care – PPO | Admitting: Plastic Surgery

## 2020-01-11 ENCOUNTER — Encounter: Payer: Self-pay | Admitting: Plastic Surgery

## 2020-01-11 VITALS — BP 113/79 | HR 71 | Temp 97.8°F | Ht 62.0 in | Wt 204.8 lb

## 2020-01-11 DIAGNOSIS — D0511 Intraductal carcinoma in situ of right breast: Secondary | ICD-10-CM | POA: Diagnosis not present

## 2020-01-11 NOTE — Progress Notes (Signed)
Patient ID: Stacey Singh, female    DOB: 1976-02-26, 44 y.o.   MRN: [id number]   Chief Complaint  Patient presents with   Breast Cancer    The patient is a 44 yrs old wf here with her husband for consultation for breast reconstruction.  She recently found out she has right breast ductal carcinoma in situ.  This was discovered by a routine screening mammogram which showed calcifications in 2 different areas.  The tumor is estrogen and progesterone positive.  The areas are located in the upper outer and the upper inner quadrant of the right breast.  She has an area of concern of the left breast and is scheduled for a biopsy later this week.  She was genetic tested and it was negative.  The patient is still concerned because her mother and her grandmother both had breast cancer.  The patient quit smoking many years ago.  She is 5 feet 2 inches tall and weighs 204 pounds.  Has a preop bra size of a 44 DDD.  On exam she has significant ptosis of her breasts.  She is a Pharmacist, hospital in [employer].   Review of Systems  Constitutional: Negative.  Negative for activity change and appetite change.  HENT: Negative.   Eyes: Negative.  Negative for discharge.  Respiratory: Negative.  Negative for chest tightness.   Cardiovascular: Negative.  Negative for leg swelling.  Gastrointestinal: Negative.  Negative for abdominal distention and abdominal pain.  Endocrine: Negative.   Genitourinary: Negative.   Skin: Negative.   Neurological: Negative.   Hematological: Negative.   Psychiatric/Behavioral: Negative.     Past Medical History:  Diagnosis Date   Allergy    Family history of breast cancer    Family history of leukemia    GERD (gastroesophageal reflux disease)    Heart murmur    in high school, resolved   Heartburn in pregnancy    Hypertension    with current pregnancy   Hypertension    No pertinent past medical history     Past Surgical History:  Procedure Laterality Date    CESAREAN SECTION  04/04/2012   Procedure: CESAREAN SECTION;  Surgeon: Farrel Gobble. Harrington Challenger, MD;  Location: [facility];  Service: Obstetrics;  Laterality: N/A;   CESAREAN SECTION N/A 05/19/2015   Procedure: CESAREAN SECTION;  Surgeon: Allyn Kenner, DO;  Location: [facility];  Service: Obstetrics;  Laterality: N/A;   CESAREAN SECTION     DILATION AND CURETTAGE OF UTERUS     WISDOM TOOTH EXTRACTION        Current Outpatient Medications:    olmesartan (BENICAR) 40 MG tablet, Take 1 tablet (40 mg total) by mouth daily., Disp: 90 tablet, Rfl: 3   Multiple Vitamin (ONE DAILY) tablet, Take by mouth. (Patient not taking: Reported on 01/11/2020), Disp: , Rfl:    Objective:   Vitals:   01/11/20 0923  BP: 113/79  Pulse: 71  Temp: 97.8 F (36.6 C)  SpO2: 99%    Physical Exam Vitals and nursing note reviewed.  Constitutional:      Appearance: Normal appearance.  HENT:     Head: Normocephalic and atraumatic.  Cardiovascular:     Rate and Rhythm: Normal rate.     Pulses: Normal pulses.  Pulmonary:     Effort: Pulmonary effort is normal. No respiratory distress.  Abdominal:     General: Abdomen is flat. There is no distension.     Tenderness: There is no abdominal tenderness.  Musculoskeletal:     Cervical back: Normal range of motion.  Skin:    General: Skin is warm.     Capillary Refill: Capillary refill takes less than 2 seconds.  Neurological:     General: No focal deficit present.     Mental Status: She is alert and oriented to person, place, and time.  Psychiatric:        Mood and Affect: Mood normal.        Behavior: Behavior normal.        Thought Content: Thought content normal.     Assessment & Plan:  Ductal carcinoma in situ (DCIS) of right breast  We had a detailed conversation about the patients options for breast reconstruction. Several reconstruction options were explained to the patient.  It is important to remember that breast reconstruction is an optional  procedure. Reconstruction often requires several stages of surgery and this means more than one operation.  The surgeries are often done several months apart.  The entire process from start to finish can take a year or more. The major goal of breast reconstruction is to look normal in clothing. There will always be scars and a difference noticeable without clothes.  This is true for asymmetries where both breasts will not be identical.  Surgery may be needed or desired to the non-cancerous breast in order to achieve better symmetry and satisfactory results.  Regardless of the reconstructive method, there is always risks and the possibility that the procedure will fail or have complications.  This couls required additional surgeries.    We discussed the available methods of breast reconstruction and included:  1. Tissue expander with Acellular dermal matrix followed by implant based reconstruction. This can be done as one surgery or multiple surgeries.  2. Autologous reconstruction can include using a muscle or tissue from another area of the body for the reconstruction.  3. Combined procedures like the latissismus dorsi flaps that often uses the muscle with an expander or implant.  For each of the method discussed the risks, benefits, scars and recovery time were discussed in detail. Specific risks included bleeding, infection, hematoma, seroma, scarring, pain, wound healing complications, flap loss, fat necrosis, capsular contracture, need for implant removal, donor site complications, bulge, hernia, umbilical necrosis, need for urgent reoperation, and need for dressing changes.   After the options were discussed we focused on the patient's desires and the procedure that was best for her based on all the information.  A total of 50 minutes of face-to-face time was spent in this encounter, of which >50% was spent in counseling.    The patient is leaning towards a right mastectomy if the biopsy is  concerning on the left side than she will have bilateral mass she is aware that she could undergo a left breast symmetry surgery with a mastopexy reduction if she decides not to have the mastectomy on the left.  She is leaning towards implant-based reconstruction.  She would like to talk more when she finds the results out about the biopsy on the left.  We have a telemetry visit planned.  I have reviewed her chart and including the oncology note and the path report. Pictures were obtained of the patient and placed in the chart with the patient's or guardian's permission.   [place], DO

## 2020-01-15 ENCOUNTER — Other Ambulatory Visit: Payer: Self-pay | Admitting: *Deleted

## 2020-01-15 MED ORDER — TAMOXIFEN CITRATE 20 MG PO TABS
20.0000 mg | ORAL_TABLET | Freq: Every day | ORAL | 3 refills | Status: DC
Start: 2020-01-15 — End: 2020-03-25

## 2020-01-15 NOTE — Telephone Encounter (Signed)
Spoke with patient.  She is requesting to start tamoxifen since it may late July early August before she has surgery. Per Dr. Lindi Adie this is ok.  Informed her she will need to stop medication 2 weeks before sx. Discussed side effects.  She knows to call if she has problems or questions.  Will call into her pharmacy

## 2020-01-19 ENCOUNTER — Ambulatory Visit
Admission: RE | Admit: 2020-01-19 | Discharge: 2020-01-19 | Disposition: A | Discharge status: Home / Self Care | Payer: BC Managed Care – PPO | Source: Ambulatory Visit | Attending: General Surgery | Admitting: General Surgery

## 2020-01-19 ENCOUNTER — Other Ambulatory Visit: Payer: Self-pay | Admitting: General Surgery

## 2020-01-19 ENCOUNTER — Other Ambulatory Visit: Payer: Self-pay

## 2020-01-19 ENCOUNTER — Ambulatory Visit: Admission: RE | Admit: 2020-01-19 | Payer: BC Managed Care – PPO | Source: Ambulatory Visit

## 2020-01-19 DIAGNOSIS — R9389 Abnormal findings on diagnostic imaging of other specified body structures: Secondary | ICD-10-CM

## 2020-01-19 MED ORDER — GADOBUTROL 1 MMOL/ML IV SOLN
9.0000 mL | Freq: Once | INTRAVENOUS | Status: AC | PRN
  Administered 2020-01-19: 9 mL via INTRAVENOUS

## 2020-01-22 ENCOUNTER — Encounter: Payer: Self-pay | Admitting: *Deleted

## 2020-01-23 ENCOUNTER — Telehealth (INDEPENDENT_AMBULATORY_CARE_PROVIDER_SITE_OTHER): Payer: BC Managed Care – PPO | Admitting: Plastic Surgery

## 2020-01-23 ENCOUNTER — Ambulatory Visit: Payer: Self-pay | Admitting: General Surgery

## 2020-01-23 ENCOUNTER — Other Ambulatory Visit: Payer: Self-pay

## 2020-01-23 DIAGNOSIS — D0511 Intraductal carcinoma in situ of right breast: Secondary | ICD-10-CM | POA: Diagnosis not present

## 2020-01-23 NOTE — Progress Notes (Addendum)
   Subjective:    Patient ID: Stacey Singh, female    DOB: 12/30/1975, 44 y.o.   MRN: [id number]  The patient is a 44 yrs old wf joining me by televisit for discussion about her breast reconstruction.  She has RIGHT ductal carcinoma in situ.  The tumor is estrogen and progesterone positive.  The area is in the upper outer and upper inner quadrant of the right breast.  The left breast was imaged again and the recommendation was for follow up imaging.  The patient is 5 feet 2 inches tall and weighs 204 pounds.  Her preop bra size = 44 DDD.  She has decided on bilateral mastectomies and bilateral immediate reconstruction.     Review of Systems  Constitutional: Negative.   HENT: Negative.   Eyes: Negative.   Respiratory: Negative.   Cardiovascular: Negative.   Endocrine: Negative.   Genitourinary: Negative.         Objective:   Physical Exam  televisit     Assessment & Plan:     ICD-10-CM   1. Ductal carcinoma in situ (DCIS) of right breast  D05.11     After our 15 minute discussion we agreed to bilateral immediate reconstruction with expanders and Flex HD.  She understands that autologous reconstruction could be an option in the future if needed (radiation).  She has been very stressed about the process and the waiting.  She has two young kids and does not want to go through this again.  I understand and will get the orders in place.    I connected with  Kathye E Tigue on 01/25/20 by telemedicine application while at the office. and verified that I am speaking with the correct person using two identifiers.   I discussed the limitations of evaluation and management by telemedicine. The patient expressed understanding and agreed to proceed.

## 2020-01-24 ENCOUNTER — Encounter: Payer: Self-pay | Admitting: Licensed Clinical Social Worker

## 2020-01-24 NOTE — Progress Notes (Signed)
[facility] CSW Progress Note  Clinical Education officer, museum contacted patient by phone to check on coping and adjustment. Patient reports that she is doing better since we spoke two weeks ago. She had good meetings with Dr. Marlou Starks & Dr. Marla Roe and has chosen to do bilateral mastectomies with reconstruction. CSW processed with patient and discussed ability to take back some control through this process. Even though patient still becomes emotional when talking about it, she now feels like she "can breath again". CSW will continue to check in periodically as patient enters active treatment.    Edwinna Areola Remiel Corti , LCSW

## 2020-01-30 ENCOUNTER — Encounter: Payer: Self-pay | Admitting: *Deleted

## 2020-01-30 ENCOUNTER — Telehealth: Payer: Self-pay | Admitting: Hematology and Oncology

## 2020-01-30 NOTE — Telephone Encounter (Signed)
Scheduled appt per 7/6 schmsg -mailed reminder letter with appt date and time

## 2020-02-05 ENCOUNTER — Encounter: Payer: Self-pay | Admitting: *Deleted

## 2020-02-25 NOTE — H&P (View-Only) (Signed)
ICD-10-CM   1. Ductal carcinoma in situ (DCIS) of right breast  D05.11       Patient ID: Stacey Singh, female    DOB: 1976/06/26, 44 y.o.   MRN: [id number]   History of Present Illness: Stacey Singh is a 44 y.o.  female  with a history of right ductal carcinoma in situ; ERPR+.  She presents for preoperative evaluation for upcoming procedure, bilateral mastectomies with Right sentinal lymph node biopsy with Dr. Marlou Starks and reconstruction with placement of tissue expanders and flex HD with Dr. Marla Roe , scheduled for 03/13/20.  Summary from previous visit: Patient is 5' 2"  and weight 204 lbs. Preop bra size is 40 DDD.  She has two Mahina Salatino children.   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  The patient has not had surgery before so unknown if she has problems with anesthesia.   Past Medical History: Allergies: Allergies  Allergen Reactions  . Penicillins   . Penicillins     Childhood reaction    Current Medications:  Current Outpatient Medications:  Marland Kitchen  Multiple Vitamin (ONE DAILY) tablet, Take by mouth. , Disp: , Rfl:  .  olmesartan (BENICAR) 40 MG tablet, Take 1 tablet (40 mg total) by mouth daily., Disp: 90 tablet, Rfl: 3 .  tamoxifen (NOLVADEX) 20 MG tablet, Take 1 tablet (20 mg total) by mouth daily., Disp: 90 tablet, Rfl: 3 .  ciprofloxacin (CIPRO) 500 MG tablet, Take 1 tablet (500 mg total) by mouth 2 (two) times daily for 5 days. For use AFTER surgery, Disp: 10 tablet, Rfl: 0 .  diazepam (VALIUM) 2 MG tablet, Take 1 tablet (2 mg total) by mouth every 12 (twelve) hours as needed for muscle spasms., Disp: 20 tablet, Rfl: 0 .  HYDROcodone-acetaminophen (NORCO) 5-325 MG tablet, Take 1 tablet by mouth every 8 (eight) hours as needed for up to 7 days for severe pain. For use AFTER Surgery, Disp: 21 tablet, Rfl: 0 .  ondansetron (ZOFRAN) 4 MG tablet, Take 1 tablet (4 mg total) by mouth  every 8 (eight) hours as needed for nausea or vomiting., Disp: 20 tablet, Rfl: 0  Past Medical Problems: Past Medical History:  Diagnosis Date  . Allergy   . Family history of breast cancer   . Family history of leukemia   . GERD (gastroesophageal reflux disease)   . Heart murmur    in high school, resolved  . Heartburn in pregnancy   . Hypertension    with current pregnancy  . Hypertension   . No pertinent past medical history     Past Surgical History: Past Surgical History:  Procedure Laterality Date  . CESAREAN SECTION  04/04/2012   Procedure: CESAREAN SECTION;  Surgeon: Farrel Gobble. Harrington Challenger, MD;  Location: [facility];  Service: Obstetrics;  Laterality: N/A;  . CESAREAN SECTION N/A 05/19/2015   Procedure: CESAREAN SECTION;  Surgeon: Allyn Kenner, DO;  Location: [facility];  Service: Obstetrics;  Laterality: N/A;  . CESAREAN SECTION    . DILATION AND CURETTAGE OF UTERUS    . WISDOM TOOTH EXTRACTION      Social History: Social History   Socioeconomic History  . Marital status: Married    Spouse name: Not on file  . Number of children: Not on file  . Years of education: Not on file  . Highest education level: Not on file  Occupational History  . Not on file  Tobacco Use  . Smoking status: Never Smoker  . Smokeless tobacco: Never Used  Substance and Sexual Activity  . Alcohol use: Not Currently  . Drug use: No  . Sexual activity: Not on file  Other Topics Concern  . Not on file  Social History Narrative   ** Merged History Encounter **       Social Determinants of Health   Financial Resource Strain:   . Difficulty of Paying Living Expenses:   Food Insecurity:   . Worried About Charity fundraiser in the Last Year:   . Arboriculturist in the Last Year:   Transportation Needs:   . Film/video editor (Medical):   Marland Kitchen Lack of Transportation (Non-Medical):   Physical Activity:   . Days of Exercise per Week:   . Minutes of Exercise per Session:   Stress:   . Feeling  of Stress :   Social Connections:   . Frequency of Communication with Friends and Family:   . Frequency of Social Gatherings with Friends and Family:   . Attends Religious Services:   . Active Member of Clubs or Organizations:   . Attends Archivist Meetings:   Marland Kitchen Marital Status:   Intimate Partner Violence:   . Fear of Current or Ex-Partner:   . Emotionally Abused:   Marland Kitchen Physically Abused:   . Sexually Abused:     Family History: Family History  Problem Relation Age of Onset  . Hypertension Mother   . Breast cancer Mother 63       negative genetic testing  . Heart disease Father   . Hypertension Sister   . Hypertension Brother   . Diabetes Brother   . Breast cancer Maternal Grandmother        dx. in her 53s  . Heart disease Maternal Grandfather   . Heart Problems Paternal Grandmother   . Heart Problems Paternal Grandfather   . Breast cancer Paternal Aunt        dx. in her 26s  . Breast cancer Other        dx. in her 45s or 80s (MGM's sister)  . Kidney disease Maternal Uncle   . Leukemia Paternal Aunt 67    Review of Systems: Review of Systems  Constitutional: Negative for chills and fever.  HENT: Negative for congestion and sore throat.   Respiratory: Negative for cough and shortness of breath.   Cardiovascular: Negative for chest pain and palpitations.  Gastrointestinal: Negative for abdominal pain, nausea and vomiting.  Musculoskeletal: Negative for back pain, joint pain, myalgias and neck pain.  Skin: Negative for itching and rash.    Physical Exam: Vital Signs BP 133/89 (BP Location: Left Arm, Patient Position: Sitting, Cuff Size: Large)   Pulse 70   Temp 98.3 F (36.8 C) (Oral)   Ht 5' 3"  (1.6 m)   Wt 211 lb (95.7 kg)   SpO2 100%   BMI 37.38 kg/m  Physical Exam Vitals and nursing note reviewed.  Constitutional:      General: She is not in acute distress.    Appearance: Normal appearance. She is obese. She is not ill-appearing.  HENT:      Head: Normocephalic and atraumatic.  Eyes:     Extraocular Movements: Extraocular movements intact.  Cardiovascular:     Rate and Rhythm: Normal rate and regular rhythm.     Pulses: Normal pulses.     Heart sounds: Normal heart sounds.  Pulmonary:     Effort:  Pulmonary effort is normal.     Breath sounds: Normal breath sounds. No wheezing, rhonchi or rales.  Abdominal:     General: Bowel sounds are normal.     Palpations: Abdomen is soft.  Musculoskeletal:        General: No swelling. Normal range of motion.     Cervical back: Normal range of motion.  Skin:    General: Skin is warm and dry.     Coloration: Skin is not pale.     Findings: No erythema or rash.  Neurological:     General: No focal deficit present.     Mental Status: She is alert and oriented to person, place, and time.  Psychiatric:        Mood and Affect: Mood normal.        Behavior: Behavior normal.        Thought Content: Thought content normal.        Judgment: Judgment normal.     Assessment/Plan:  Stacey Singh scheduled for bilateral mastectomies with right sentinal lymph node biopsy with Dr. Marlou Starks and reconstruction with placement of tissue expanders and flex HD with Dr. Marla Roe.  Risks, benefits, and alternatives of procedure discussed, questions answered and consent obtained.    Smoking Status: Non-smoker - quit 12 yrs ago; Counseling Given? N/A Last Mammogram: MR 6/11; Results: bilateral clumped nonmass enhancements  Caprini Score: 6 High; Risk Factors include: 44 yr-old female, hx cancer, BMI > 25, and length of planned surgery. Recommendation for mechanical and pharmacological prophylaxis during surgery. Encourage early ambulation.   Pictures obtained: 01/11/20  Post-op Rx sent to pharmacy: Norco, Valium, Zofran, Cipro  Patient was provided with the Tissue Expander risks and General Surgical Risk consent document and Pain Medication Agreement prior to their appointment.  They had adequate time to  read through the risk consent documents and Pain Medication Agreement. We also discussed them in person together during this preop appointment. All of their questions were answered to their satisfaction.  Recommended calling if they have any further questions.  Risk consent form and Pain Medication Agreement to be scanned into patient's chart.  The risks that can be encountered with and after placement of a breast expander placement were discussed and include the following but not limited to these: bleeding, infection, delayed healing, anesthesia risks, skin sensation changes, injury to structures including nerves, blood vessels, and muscles which may be temporary or permanent, allergies to tape, suture materials and glues, blood products, topical preparations or injected agents, skin contour irregularities, skin discoloration and swelling, deep vein thrombosis, cardiac and pulmonary complications, pain, which may persist, fluid accumulation, wrinkling of the skin over the expander, changes in nipple or breast sensation, expander leakage or rupture, faulty position of the expander, persistent pain, formation of tight scar tissue around the expander (capsular contracture), possible need for revisional surgery or staged procedures.  The [place] was signed into law in 2016 which includes the topic of electronic health records.  This provides immediate access to information in MyChart.  This includes consultation notes, operative notes, office notes, lab results and pathology reports.  If you have any questions about what you read please let us know at your next visit or call us at the office.  We are right here with you.   Electronically signed by: Threasa Heads, PA-C 02/27/2020 4:47 PM

## 2020-02-25 NOTE — Progress Notes (Signed)
ICD-10-CM   1. Ductal carcinoma in situ (DCIS) of right breast  D05.11       Patient ID: Stacey Singh, female    DOB: 1975-10-24, 44 y.o.   MRN: [id number]   History of Present Illness: Stacey Singh is a 44 y.o.  female  with a history of right ductal carcinoma in situ; ERPR+.  She presents for preoperative evaluation for upcoming procedure, bilateral mastectomies with Right sentinal lymph node biopsy with Dr. Marlou Starks and reconstruction with placement of tissue expanders and flex HD with Dr. Marla Roe , scheduled for 03/13/20.  Summary from previous visit: Patient is 5' 2"  and weight 204 lbs. Preop bra size is 40 DDD.  She has two Braedan Meuth children.   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  The patient has not had surgery before so unknown if she has problems with anesthesia.   Past Medical History: Allergies: Allergies  Allergen Reactions  . Penicillins   . Penicillins     Childhood reaction    Current Medications:  Current Outpatient Medications:  Marland Kitchen  Multiple Vitamin (ONE DAILY) tablet, Take by mouth. , Disp: , Rfl:  .  olmesartan (BENICAR) 40 MG tablet, Take 1 tablet (40 mg total) by mouth daily., Disp: 90 tablet, Rfl: 3 .  tamoxifen (NOLVADEX) 20 MG tablet, Take 1 tablet (20 mg total) by mouth daily., Disp: 90 tablet, Rfl: 3 .  ciprofloxacin (CIPRO) 500 MG tablet, Take 1 tablet (500 mg total) by mouth 2 (two) times daily for 5 days. For use AFTER surgery, Disp: 10 tablet, Rfl: 0 .  diazepam (VALIUM) 2 MG tablet, Take 1 tablet (2 mg total) by mouth every 12 (twelve) hours as needed for muscle spasms., Disp: 20 tablet, Rfl: 0 .  HYDROcodone-acetaminophen (NORCO) 5-325 MG tablet, Take 1 tablet by mouth every 8 (eight) hours as needed for up to 7 days for severe pain. For use AFTER Surgery, Disp: 21 tablet, Rfl: 0 .  ondansetron (ZOFRAN) 4 MG tablet, Take 1 tablet (4 mg total) by mouth  every 8 (eight) hours as needed for nausea or vomiting., Disp: 20 tablet, Rfl: 0  Past Medical Problems: Past Medical History:  Diagnosis Date  . Allergy   . Family history of breast cancer   . Family history of leukemia   . GERD (gastroesophageal reflux disease)   . Heart murmur    in high school, resolved  . Heartburn in pregnancy   . Hypertension    with current pregnancy  . Hypertension   . No pertinent past medical history     Past Surgical History: Past Surgical History:  Procedure Laterality Date  . CESAREAN SECTION  04/04/2012   Procedure: CESAREAN SECTION;  Surgeon: Farrel Gobble. Harrington Challenger, MD;  Location: [facility];  Service: Obstetrics;  Laterality: N/A;  . CESAREAN SECTION N/A 05/19/2015   Procedure: CESAREAN SECTION;  Surgeon: Allyn Kenner, DO;  Location: [facility];  Service: Obstetrics;  Laterality: N/A;  . CESAREAN SECTION    . DILATION AND CURETTAGE OF UTERUS    . WISDOM TOOTH EXTRACTION      Social History: Social History   Socioeconomic History  . Marital status: Married    Spouse name: Not on file  . Number of children: Not on file  . Years of education: Not on file  . Highest education level: Not on file  Occupational History  . Not on file  Tobacco Use  . Smoking status: Never Smoker  . Smokeless tobacco: Never Used  Substance and Sexual Activity  . Alcohol use: Not Currently  . Drug use: No  . Sexual activity: Not on file  Other Topics Concern  . Not on file  Social History Narrative   ** Merged History Encounter **       Social Determinants of Health   Financial Resource Strain:   . Difficulty of Paying Living Expenses:   Food Insecurity:   . Worried About Charity fundraiser in the Last Year:   . Arboriculturist in the Last Year:   Transportation Needs:   . Film/video editor (Medical):   Marland Kitchen Lack of Transportation (Non-Medical):   Physical Activity:   . Days of Exercise per Week:   . Minutes of Exercise per Session:   Stress:   . Feeling  of Stress :   Social Connections:   . Frequency of Communication with Friends and Family:   . Frequency of Social Gatherings with Friends and Family:   . Attends Religious Services:   . Active Member of Clubs or Organizations:   . Attends Archivist Meetings:   Marland Kitchen Marital Status:   Intimate Partner Violence:   . Fear of Current or Ex-Partner:   . Emotionally Abused:   Marland Kitchen Physically Abused:   . Sexually Abused:     Family History: Family History  Problem Relation Age of Onset  . Hypertension Mother   . Breast cancer Mother 76       negative genetic testing  . Heart disease Father   . Hypertension Sister   . Hypertension Brother   . Diabetes Brother   . Breast cancer Maternal Grandmother        dx. in her 76s  . Heart disease Maternal Grandfather   . Heart Problems Paternal Grandmother   . Heart Problems Paternal Grandfather   . Breast cancer Paternal Aunt        dx. in her 74s  . Breast cancer Other        dx. in her 31s or 39s (MGM's sister)  . Kidney disease Maternal Uncle   . Leukemia Paternal Aunt 10    Review of Systems: Review of Systems  Constitutional: Negative for chills and fever.  HENT: Negative for congestion and sore throat.   Respiratory: Negative for cough and shortness of breath.   Cardiovascular: Negative for chest pain and palpitations.  Gastrointestinal: Negative for abdominal pain, nausea and vomiting.  Musculoskeletal: Negative for back pain, joint pain, myalgias and neck pain.  Skin: Negative for itching and rash.    Physical Exam: Vital Signs BP 133/89 (BP Location: Left Arm, Patient Position: Sitting, Cuff Size: Large)   Pulse 70   Temp 98.3 F (36.8 C) (Oral)   Ht 5' 3"  (1.6 m)   Wt 211 lb (95.7 kg)   SpO2 100%   BMI 37.38 kg/m  Physical Exam Vitals and nursing note reviewed.  Constitutional:      General: She is not in acute distress.    Appearance: Normal appearance. She is obese. She is not ill-appearing.  HENT:      Head: Normocephalic and atraumatic.  Eyes:     Extraocular Movements: Extraocular movements intact.  Cardiovascular:     Rate and Rhythm: Normal rate and regular rhythm.     Pulses: Normal pulses.     Heart sounds: Normal heart sounds.  Pulmonary:     Effort:  Pulmonary effort is normal.     Breath sounds: Normal breath sounds. No wheezing, rhonchi or rales.  Abdominal:     General: Bowel sounds are normal.     Palpations: Abdomen is soft.  Musculoskeletal:        General: No swelling. Normal range of motion.     Cervical back: Normal range of motion.  Skin:    General: Skin is warm and dry.     Coloration: Skin is not pale.     Findings: No erythema or rash.  Neurological:     General: No focal deficit present.     Mental Status: She is alert and oriented to person, place, and time.  Psychiatric:        Mood and Affect: Mood normal.        Behavior: Behavior normal.        Thought Content: Thought content normal.        Judgment: Judgment normal.     Assessment/Plan:  Ms. Palla scheduled for bilateral mastectomies with right sentinal lymph node biopsy with Dr. Marlou Starks and reconstruction with placement of tissue expanders and flex HD with Dr. Marla Roe.  Risks, benefits, and alternatives of procedure discussed, questions answered and consent obtained.    Smoking Status: Non-smoker - quit 12 yrs ago; Counseling Given? N/A Last Mammogram: MR 6/11; Results: bilateral clumped nonmass enhancements  Caprini Score: 6 High; Risk Factors include: 44 yr-old female, hx cancer, BMI > 25, and length of planned surgery. Recommendation for mechanical and pharmacological prophylaxis during surgery. Encourage early ambulation.   Pictures obtained: 01/11/20  Post-op Rx sent to pharmacy: Norco, Valium, Zofran, Cipro  Patient was provided with the Tissue Expander risks and General Surgical Risk consent document and Pain Medication Agreement prior to their appointment.  They had adequate time to  read through the risk consent documents and Pain Medication Agreement. We also discussed them in person together during this preop appointment. All of their questions were answered to their satisfaction.  Recommended calling if they have any further questions.  Risk consent form and Pain Medication Agreement to be scanned into patient's chart.  The risks that can be encountered with and after placement of a breast expander placement were discussed and include the following but not limited to these: bleeding, infection, delayed healing, anesthesia risks, skin sensation changes, injury to structures including nerves, blood vessels, and muscles which may be temporary or permanent, allergies to tape, suture materials and glues, blood products, topical preparations or injected agents, skin contour irregularities, skin discoloration and swelling, deep vein thrombosis, cardiac and pulmonary complications, pain, which may persist, fluid accumulation, wrinkling of the skin over the expander, changes in nipple or breast sensation, expander leakage or rupture, faulty position of the expander, persistent pain, formation of tight scar tissue around the expander (capsular contracture), possible need for revisional surgery or staged procedures.  The [place] was signed into law in 2016 which includes the topic of electronic health records.  This provides immediate access to information in MyChart.  This includes consultation notes, operative notes, office notes, lab results and pathology reports.  If you have any questions about what you read please let us know at your next visit or call us at the office.  We are right here with you.   Electronically signed by: Threasa Heads, PA-C 02/27/2020 4:47 PM

## 2020-02-27 ENCOUNTER — Encounter: Payer: Self-pay | Admitting: Plastic Surgery

## 2020-02-27 ENCOUNTER — Ambulatory Visit (INDEPENDENT_AMBULATORY_CARE_PROVIDER_SITE_OTHER): Payer: BC Managed Care – PPO | Admitting: Plastic Surgery

## 2020-02-27 ENCOUNTER — Other Ambulatory Visit: Payer: Self-pay

## 2020-02-27 VITALS — BP 133/89 | HR 70 | Temp 98.3°F | Ht 63.0 in | Wt 211.0 lb

## 2020-02-27 DIAGNOSIS — D0511 Intraductal carcinoma in situ of right breast: Secondary | ICD-10-CM

## 2020-02-27 MED ORDER — DIAZEPAM 2 MG PO TABS
2.0000 mg | ORAL_TABLET | Freq: Two times a day (BID) | ORAL | 0 refills | Status: DC | PRN
Start: 2020-02-27 — End: 2020-05-14

## 2020-02-27 MED ORDER — CIPROFLOXACIN HCL 500 MG PO TABS
500.0000 mg | ORAL_TABLET | Freq: Two times a day (BID) | ORAL | 0 refills | Status: AC
Start: 2020-02-27 — End: 2020-03-03

## 2020-02-27 MED ORDER — HYDROCODONE-ACETAMINOPHEN 5-325 MG PO TABS: 1.0000 | ORAL_TABLET | Freq: Three times a day (TID) | ORAL | 0 refills | Status: AC | PRN

## 2020-02-27 MED ORDER — ONDANSETRON HCL 4 MG PO TABS: 4.0000 mg | ORAL_TABLET | Freq: Three times a day (TID) | ORAL | 0 refills | Status: DC | PRN

## 2020-03-05 ENCOUNTER — Encounter: Payer: Self-pay | Admitting: Licensed Clinical Social Worker

## 2020-03-05 ENCOUNTER — Other Ambulatory Visit: Payer: Self-pay

## 2020-03-05 ENCOUNTER — Encounter (HOSPITAL_BASED_OUTPATIENT_CLINIC_OR_DEPARTMENT_OTHER): Payer: Self-pay | Admitting: General Surgery

## 2020-03-05 NOTE — Progress Notes (Signed)
[facility] CSW Progress Note  Clinical Education officer, museum contacted patient by phone to provide support and check on coping prior to surgery next week. Pt reports having mix of emotions with feeling nervous but ready. She is doing well continuing her running 3x/week and has plans to have a small birthday celebration this weekend. CSW normalized emotions and encouraged patient to continue self-care and plan a soothing activity for the day before. Encouraged pt to call if anxiety increases without any relief from typical coping skills.    Edwinna Areola Joniqua Sidle , LCSW

## 2020-03-09 ENCOUNTER — Other Ambulatory Visit (HOSPITAL_COMMUNITY): Payer: BC Managed Care – PPO

## 2020-03-11 ENCOUNTER — Encounter (HOSPITAL_BASED_OUTPATIENT_CLINIC_OR_DEPARTMENT_OTHER)
Admission: RE | Admit: 2020-03-11 | Discharge: 2020-03-11 | Disposition: A | Discharge status: Home / Self Care | Payer: BC Managed Care – PPO | Source: Ambulatory Visit | Attending: General Surgery | Admitting: General Surgery

## 2020-03-11 ENCOUNTER — Other Ambulatory Visit (HOSPITAL_COMMUNITY)
Admission: RE | Admit: 2020-03-11 | Discharge: 2020-03-11 | Disposition: A | Discharge status: Home / Self Care | Payer: BC Managed Care – PPO | Source: Ambulatory Visit | Attending: General Surgery | Admitting: General Surgery

## 2020-03-11 DIAGNOSIS — Z01812 Encounter for preprocedural laboratory examination: Secondary | ICD-10-CM | POA: Insufficient documentation

## 2020-03-11 DIAGNOSIS — Z20822 Contact with and (suspected) exposure to covid-19: Secondary | ICD-10-CM | POA: Insufficient documentation

## 2020-03-11 LAB — POCT PREGNANCY, URINE: Preg Test, Ur: NEGATIVE

## 2020-03-11 LAB — SARS CORONAVIRUS 2 (TAT 6-24 HRS): SARS Coronavirus 2: NEGATIVE

## 2020-03-11 NOTE — Progress Notes (Signed)

## 2020-03-13 ENCOUNTER — Encounter (HOSPITAL_BASED_OUTPATIENT_CLINIC_OR_DEPARTMENT_OTHER)
Admission: RE | Disposition: A | Discharge status: Home / Self Care | Payer: Self-pay | Source: Home / Self Care | Attending: Plastic Surgery

## 2020-03-13 ENCOUNTER — Encounter (HOSPITAL_BASED_OUTPATIENT_CLINIC_OR_DEPARTMENT_OTHER): Payer: Self-pay | Admitting: General Surgery

## 2020-03-13 ENCOUNTER — Other Ambulatory Visit: Payer: Self-pay

## 2020-03-13 ENCOUNTER — Ambulatory Visit (HOSPITAL_COMMUNITY)
Admission: RE | Admit: 2020-03-13 | Discharge: 2020-03-13 | Disposition: A | Discharge status: Home / Self Care | Payer: BC Managed Care – PPO | Source: Ambulatory Visit | Attending: General Surgery | Admitting: General Surgery

## 2020-03-13 ENCOUNTER — Observation Stay (HOSPITAL_BASED_OUTPATIENT_CLINIC_OR_DEPARTMENT_OTHER)
Admission: RE | Admit: 2020-03-13 | Discharge: 2020-03-14 | Disposition: A | Discharge status: Home / Self Care | Payer: BC Managed Care – PPO | Attending: Plastic Surgery | Admitting: Plastic Surgery

## 2020-03-13 ENCOUNTER — Ambulatory Visit (HOSPITAL_BASED_OUTPATIENT_CLINIC_OR_DEPARTMENT_OTHER): Payer: BC Managed Care – PPO | Admitting: Anesthesiology

## 2020-03-13 DIAGNOSIS — I10 Essential (primary) hypertension: Secondary | ICD-10-CM | POA: Diagnosis not present

## 2020-03-13 DIAGNOSIS — Z87891 Personal history of nicotine dependence: Secondary | ICD-10-CM | POA: Diagnosis not present

## 2020-03-13 DIAGNOSIS — D0511 Intraductal carcinoma in situ of right breast: Principal | ICD-10-CM | POA: Insufficient documentation

## 2020-03-13 DIAGNOSIS — C50919 Malignant neoplasm of unspecified site of unspecified female breast: Secondary | ICD-10-CM | POA: Diagnosis present

## 2020-03-13 HISTORY — PX: MASTECTOMY W/ SENTINEL NODE BIOPSY: SHX2001

## 2020-03-13 HISTORY — PX: BREAST RECONSTRUCTION WITH PLACEMENT OF TISSUE EXPANDER AND FLEX HD (ACELLULAR HYDRATED DERMIS): SHX6295

## 2020-03-13 SURGERY — MASTECTOMY WITH SENTINEL LYMPH NODE BIOPSY
Anesthesia: General | Site: Breast | Laterality: Bilateral

## 2020-03-13 MED ORDER — FENTANYL CITRATE (PF) 100 MCG/2ML IJ SOLN
INTRAMUSCULAR | Status: AC
  Filled 2020-03-13: qty 2

## 2020-03-13 MED ORDER — CELECOXIB 200 MG PO CAPS
ORAL_CAPSULE | ORAL | Status: AC
  Filled 2020-03-13: qty 1

## 2020-03-13 MED ORDER — KCL IN DEXTROSE-NACL 20-5-0.45 MEQ/L-%-% IV SOLN
INTRAVENOUS | Status: DC
  Filled 2020-03-13: qty 1000

## 2020-03-13 MED ORDER — METRONIDAZOLE IN NACL 5-0.79 MG/ML-% IV SOLN
500.0000 mg | Freq: Three times a day (TID) | INTRAVENOUS | Status: DC
  Administered 2020-03-13: 500 mg via INTRAVENOUS

## 2020-03-13 MED ORDER — SODIUM CHLORIDE 0.9 % IV SOLN
INTRAVENOUS | Status: DC | PRN
  Administered 2020-03-13: 500 mL

## 2020-03-13 MED ORDER — CIPROFLOXACIN IN D5W 400 MG/200ML IV SOLN: 400.0000 mg | INTRAVENOUS | Status: AC

## 2020-03-13 MED ORDER — PROMETHAZINE HCL 25 MG/ML IJ SOLN: 6.2500 mg | INTRAMUSCULAR | Status: DC | PRN

## 2020-03-13 MED ORDER — DIPHENHYDRAMINE HCL 50 MG/ML IJ SOLN: 12.5000 mg | Freq: Four times a day (QID) | INTRAMUSCULAR | Status: DC | PRN

## 2020-03-13 MED ORDER — DIAZEPAM 2 MG PO TABS: 2.0000 mg | ORAL_TABLET | Freq: Two times a day (BID) | ORAL | Status: DC | PRN

## 2020-03-13 MED ORDER — HYDROMORPHONE HCL 1 MG/ML IJ SOLN: 1.0000 mg | INTRAMUSCULAR | Status: DC | PRN

## 2020-03-13 MED ORDER — BUPIVACAINE HCL (PF) 0.25 % IJ SOLN
INTRAMUSCULAR | Status: AC
  Filled 2020-03-13: qty 30

## 2020-03-13 MED ORDER — MIDAZOLAM HCL 2 MG/2ML IJ SOLN
INTRAMUSCULAR | Status: AC
  Filled 2020-03-13: qty 2

## 2020-03-13 MED ORDER — ONDANSETRON 4 MG PO TBDP: 4.0000 mg | ORAL_TABLET | Freq: Four times a day (QID) | ORAL | Status: DC | PRN

## 2020-03-13 MED ORDER — PROPOFOL 500 MG/50ML IV EMUL
INTRAVENOUS | Status: AC
  Filled 2020-03-13: qty 50

## 2020-03-13 MED ORDER — FENTANYL CITRATE (PF) 100 MCG/2ML IJ SOLN
INTRAMUSCULAR | Status: DC | PRN
  Administered 2020-03-13: 100 ug via INTRAVENOUS
  Administered 2020-03-13 (×2): 50 ug via INTRAVENOUS

## 2020-03-13 MED ORDER — DIPHENHYDRAMINE HCL 12.5 MG/5ML PO ELIX: 12.5000 mg | ORAL_SOLUTION | Freq: Four times a day (QID) | ORAL | Status: DC | PRN

## 2020-03-13 MED ORDER — VANCOMYCIN HCL IN DEXTROSE 1-5 GM/200ML-% IV SOLN
INTRAVENOUS | Status: AC
  Filled 2020-03-13: qty 200

## 2020-03-13 MED ORDER — GABAPENTIN 300 MG PO CAPS
ORAL_CAPSULE | ORAL | Status: AC
  Filled 2020-03-13: qty 1

## 2020-03-13 MED ORDER — ACETAMINOPHEN 500 MG PO TABS
ORAL_TABLET | ORAL | Status: AC
  Filled 2020-03-13: qty 2

## 2020-03-13 MED ORDER — DEXMEDETOMIDINE (PRECEDEX) IN NS 20 MCG/5ML (4 MCG/ML) IV SYRINGE
PREFILLED_SYRINGE | INTRAVENOUS | Status: DC | PRN
  Administered 2020-03-13: 20 ug via INTRAVENOUS

## 2020-03-13 MED ORDER — DIPHENHYDRAMINE HCL 50 MG/ML IJ SOLN
INTRAMUSCULAR | Status: AC
  Filled 2020-03-13: qty 1

## 2020-03-13 MED ORDER — OXYCODONE HCL 5 MG/5ML PO SOLN: 5.0000 mg | Freq: Once | ORAL | Status: DC | PRN

## 2020-03-13 MED ORDER — CELECOXIB 200 MG PO CAPS
200.0000 mg | ORAL_CAPSULE | ORAL | Status: AC
  Administered 2020-03-13: 200 mg via ORAL

## 2020-03-13 MED ORDER — PHENYLEPHRINE HCL (PRESSORS) 10 MG/ML IV SOLN
INTRAVENOUS | Status: DC | PRN
Start: 2020-03-13 — End: 2020-03-13
  Administered 2020-03-13: 80 ug via INTRAVENOUS

## 2020-03-13 MED ORDER — SODIUM CHLORIDE 0.9 % IV SOLN
INTRAVENOUS | Status: AC
  Filled 2020-03-13: qty 500000

## 2020-03-13 MED ORDER — LIDOCAINE-EPINEPHRINE 1 %-1:100000 IJ SOLN
INTRAMUSCULAR | Status: AC
  Filled 2020-03-13: qty 1

## 2020-03-13 MED ORDER — OXYCODONE HCL 5 MG PO TABS: 5.0000 mg | ORAL_TABLET | Freq: Once | ORAL | Status: DC | PRN

## 2020-03-13 MED ORDER — LIDOCAINE 2% (20 MG/ML) 5 ML SYRINGE
INTRAMUSCULAR | Status: AC
  Filled 2020-03-13: qty 5

## 2020-03-13 MED ORDER — CHLORHEXIDINE GLUCONATE CLOTH 2 % EX PADS: 6.0000 | MEDICATED_PAD | Freq: Once | CUTANEOUS | Status: DC

## 2020-03-13 MED ORDER — EPHEDRINE SULFATE 50 MG/ML IJ SOLN
INTRAMUSCULAR | Status: DC | PRN
  Administered 2020-03-13: 10 mg via INTRAVENOUS

## 2020-03-13 MED ORDER — DROPERIDOL 2.5 MG/ML IJ SOLN
INTRAMUSCULAR | Status: AC
  Filled 2020-03-13: qty 2

## 2020-03-13 MED ORDER — ONDANSETRON HCL 4 MG/2ML IJ SOLN: 4.0000 mg | Freq: Four times a day (QID) | INTRAMUSCULAR | Status: DC | PRN

## 2020-03-13 MED ORDER — HYDROCODONE-ACETAMINOPHEN 5-325 MG PO TABS: 1.0000 | ORAL_TABLET | Freq: Four times a day (QID) | ORAL | Status: DC | PRN

## 2020-03-13 MED ORDER — POLYETHYLENE GLYCOL 3350 17 G PO PACK: 17.0000 g | PACK | Freq: Every day | ORAL | Status: DC | PRN

## 2020-03-13 MED ORDER — BUPIVACAINE LIPOSOME 1.3 % IJ SUSP
INTRAMUSCULAR | Status: DC | PRN
  Administered 2020-03-13 (×2): 10 mL

## 2020-03-13 MED ORDER — FENTANYL CITRATE (PF) 100 MCG/2ML IJ SOLN: 25.0000 ug | INTRAMUSCULAR | Status: DC | PRN

## 2020-03-13 MED ORDER — ACETAMINOPHEN 325 MG PO TABS
325.0000 mg | ORAL_TABLET | Freq: Four times a day (QID) | ORAL | Status: DC
  Administered 2020-03-13 – 2020-03-14 (×3): 325 mg via ORAL
  Filled 2020-03-13 (×3): qty 1

## 2020-03-13 MED ORDER — DROPERIDOL 2.5 MG/ML IJ SOLN
INTRAMUSCULAR | Status: DC | PRN
  Administered 2020-03-13: .625 mg via INTRAVENOUS

## 2020-03-13 MED ORDER — MIDAZOLAM HCL 2 MG/2ML IJ SOLN
2.0000 mg | Freq: Once | INTRAMUSCULAR | Status: AC
  Administered 2020-03-13: 2 mg via INTRAVENOUS

## 2020-03-13 MED ORDER — TECHNETIUM TC 99M SULFUR COLLOID FILTERED
1.0000 | Freq: Once | INTRAVENOUS | Status: AC | PRN
  Administered 2020-03-13: 1 via INTRADERMAL

## 2020-03-13 MED ORDER — METHYLENE BLUE 0.5 % INJ SOLN
INTRAVENOUS | Status: AC
  Filled 2020-03-13: qty 10

## 2020-03-13 MED ORDER — VANCOMYCIN HCL IN DEXTROSE 1-5 GM/200ML-% IV SOLN
1000.0000 mg | INTRAVENOUS | Status: AC
  Administered 2020-03-13: 1000 mg via INTRAVENOUS

## 2020-03-13 MED ORDER — DIPHENHYDRAMINE HCL 50 MG/ML IJ SOLN
INTRAMUSCULAR | Status: DC | PRN
  Administered 2020-03-13: 12.5 mg via INTRAVENOUS

## 2020-03-13 MED ORDER — MIDAZOLAM HCL 5 MG/5ML IJ SOLN
INTRAMUSCULAR | Status: DC | PRN
  Administered 2020-03-13: 2 mg via INTRAVENOUS

## 2020-03-13 MED ORDER — GABAPENTIN 300 MG PO CAPS
300.0000 mg | ORAL_CAPSULE | ORAL | Status: AC
  Administered 2020-03-13: 300 mg via ORAL

## 2020-03-13 MED ORDER — SUGAMMADEX SODIUM 200 MG/2ML IV SOLN
INTRAVENOUS | Status: DC | PRN
  Administered 2020-03-13: 200 mg via INTRAVENOUS

## 2020-03-13 MED ORDER — PROPOFOL 10 MG/ML IV BOLUS
INTRAVENOUS | Status: DC | PRN
  Administered 2020-03-13: 200 mg via INTRAVENOUS

## 2020-03-13 MED ORDER — DEXAMETHASONE SODIUM PHOSPHATE 4 MG/ML IJ SOLN
INTRAMUSCULAR | Status: DC | PRN
  Administered 2020-03-13: 10 mg via INTRAVENOUS

## 2020-03-13 MED ORDER — METRONIDAZOLE IN NACL 5-0.79 MG/ML-% IV SOLN
INTRAVENOUS | Status: AC
  Filled 2020-03-13: qty 100

## 2020-03-13 MED ORDER — DEXAMETHASONE SODIUM PHOSPHATE 10 MG/ML IJ SOLN
INTRAMUSCULAR | Status: AC
  Filled 2020-03-13: qty 1

## 2020-03-13 MED ORDER — ARTIFICIAL TEARS OPHTHALMIC OINT
TOPICAL_OINTMENT | OPHTHALMIC | Status: AC
  Filled 2020-03-13: qty 3.5

## 2020-03-13 MED ORDER — SENNA 8.6 MG PO TABS: 1.0000 | ORAL_TABLET | Freq: Two times a day (BID) | ORAL | Status: DC

## 2020-03-13 MED ORDER — SODIUM CHLORIDE (PF) 0.9 % IJ SOLN
INTRAMUSCULAR | Status: AC
  Filled 2020-03-13: qty 10

## 2020-03-13 MED ORDER — PHENYLEPHRINE 40 MCG/ML (10ML) SYRINGE FOR IV PUSH (FOR BLOOD PRESSURE SUPPORT)
PREFILLED_SYRINGE | INTRAVENOUS | Status: AC
  Filled 2020-03-13: qty 10

## 2020-03-13 MED ORDER — DEXMEDETOMIDINE (PRECEDEX) IN NS 20 MCG/5ML (4 MCG/ML) IV SYRINGE
PREFILLED_SYRINGE | INTRAVENOUS | Status: AC
  Filled 2020-03-13: qty 5

## 2020-03-13 MED ORDER — LACTATED RINGERS IV SOLN: INTRAVENOUS | Status: DC

## 2020-03-13 MED ORDER — PHENYLEPHRINE HCL (PRESSORS) 10 MG/ML IV SOLN
INTRAVENOUS | Status: AC
  Filled 2020-03-13: qty 1

## 2020-03-13 MED ORDER — ROCURONIUM BROMIDE 100 MG/10ML IV SOLN
INTRAVENOUS | Status: DC | PRN
  Administered 2020-03-13: 70 mg via INTRAVENOUS

## 2020-03-13 MED ORDER — FENTANYL CITRATE (PF) 100 MCG/2ML IJ SOLN
100.0000 ug | Freq: Once | INTRAMUSCULAR | Status: AC
  Administered 2020-03-13: 100 ug via INTRAVENOUS

## 2020-03-13 MED ORDER — LIDOCAINE HCL (CARDIAC) PF 100 MG/5ML IV SOSY
PREFILLED_SYRINGE | INTRAVENOUS | Status: DC | PRN
  Administered 2020-03-13: 40 mg via INTRAVENOUS

## 2020-03-13 MED ORDER — IBUPROFEN 200 MG PO TABS
400.0000 mg | ORAL_TABLET | Freq: Four times a day (QID) | ORAL | Status: DC
  Administered 2020-03-13 – 2020-03-14 (×3): 400 mg via ORAL
  Filled 2020-03-13 (×3): qty 2

## 2020-03-13 MED ORDER — EPHEDRINE 5 MG/ML INJ
INTRAVENOUS | Status: AC
  Filled 2020-03-13: qty 10

## 2020-03-13 MED ORDER — BUPIVACAINE HCL (PF) 0.5 % IJ SOLN
INTRAMUSCULAR | Status: DC | PRN
  Administered 2020-03-13 (×2): 15 mL

## 2020-03-13 MED ORDER — ACETAMINOPHEN 500 MG PO TABS
1000.0000 mg | ORAL_TABLET | ORAL | Status: AC
  Administered 2020-03-13: 1000 mg via ORAL

## 2020-03-13 MED ORDER — SUCCINYLCHOLINE CHLORIDE 200 MG/10ML IV SOSY
PREFILLED_SYRINGE | INTRAVENOUS | Status: AC
  Filled 2020-03-13: qty 10

## 2020-03-13 MED ORDER — ONDANSETRON HCL 4 MG/2ML IJ SOLN
INTRAMUSCULAR | Status: AC
  Filled 2020-03-13: qty 2

## 2020-03-13 MED ORDER — ROCURONIUM BROMIDE 10 MG/ML (PF) SYRINGE
PREFILLED_SYRINGE | INTRAVENOUS | Status: AC
  Filled 2020-03-13: qty 10

## 2020-03-13 SURGICAL SUPPLY — 92 items
APPLIER CLIP 9.375 MED OPEN (MISCELLANEOUS) ×6
BAG DECANTER FOR FLEXI CONT (MISCELLANEOUS) ×3 IMPLANT
BINDER BREAST LRG (GAUZE/BANDAGES/DRESSINGS) IMPLANT
BINDER BREAST MEDIUM (GAUZE/BANDAGES/DRESSINGS) IMPLANT
BINDER BREAST XLRG (GAUZE/BANDAGES/DRESSINGS) IMPLANT
BINDER BREAST XXLRG (GAUZE/BANDAGES/DRESSINGS) ×3 IMPLANT
BIOPATCH RED 1 DISK 7.0 (GAUZE/BANDAGES/DRESSINGS) IMPLANT
BIOPATCH RED 1IN DISK 7.0MM (GAUZE/BANDAGES/DRESSINGS)
BLADE HEX COATED 2.75 (ELECTRODE) ×3 IMPLANT
BLADE SURG 10 STRL SS (BLADE) ×3 IMPLANT
BLADE SURG 15 STRL LF DISP TIS (BLADE) ×1 IMPLANT
BLADE SURG 15 STRL SS (BLADE) ×2
BNDG GAUZE ELAST 4 BULKY (GAUZE/BANDAGES/DRESSINGS) IMPLANT
CANISTER SUCT 1200ML W/VALVE (MISCELLANEOUS) ×3 IMPLANT
CHLORAPREP W/TINT 26 (MISCELLANEOUS) ×6 IMPLANT
CLIP APPLIE 9.375 MED OPEN (MISCELLANEOUS) ×2 IMPLANT
COVER BACK TABLE 60X90IN (DRAPES) ×3 IMPLANT
COVER MAYO STAND STRL (DRAPES) ×6 IMPLANT
COVER PROBE W GEL 5X96 (DRAPES) ×3 IMPLANT
COVER WAND RF STERILE (DRAPES) IMPLANT
DECANTER SPIKE VIAL GLASS SM (MISCELLANEOUS) IMPLANT
DERMABOND ADVANCED (GAUZE/BANDAGES/DRESSINGS) ×4
DERMABOND ADVANCED .7 DNX12 (GAUZE/BANDAGES/DRESSINGS) ×2 IMPLANT
DEVICE DISSECT PLASMABLAD 3.0S (MISCELLANEOUS) ×1 IMPLANT
DRAIN CHANNEL 19F RND (DRAIN) ×6 IMPLANT
DRAPE LAPAROSCOPIC ABDOMINAL (DRAPES) ×3 IMPLANT
DRAPE SURG 17X23 STRL (DRAPES) ×6 IMPLANT
DRAPE UTILITY XL STRL (DRAPES) ×3 IMPLANT
DRSG OPSITE POSTOP 4X6 (GAUZE/BANDAGES/DRESSINGS) ×6 IMPLANT
DRSG PAD ABDOMINAL 8X10 ST (GAUZE/BANDAGES/DRESSINGS) ×6 IMPLANT
ELECT BLADE 4.0 EZ CLEAN MEGAD (MISCELLANEOUS) ×3
ELECT COATED BLADE 2.86 ST (ELECTRODE) IMPLANT
ELECT REM PT RETURN 9FT ADLT (ELECTROSURGICAL) ×3
ELECTRODE BLDE 4.0 EZ CLN MEGD (MISCELLANEOUS) ×1 IMPLANT
ELECTRODE REM PT RTRN 9FT ADLT (ELECTROSURGICAL) ×1 IMPLANT
EVACUATOR SILICONE 100CC (DRAIN) ×6 IMPLANT
FUNNEL KELLER 2 DISP (MISCELLANEOUS) IMPLANT
GAUZE SPONGE 4X4 12PLY STRL LF (GAUZE/BANDAGES/DRESSINGS) ×3 IMPLANT
GLOVE BIO SURGEON STRL SZ 6.5 (GLOVE) ×4 IMPLANT
GLOVE BIO SURGEON STRL SZ7.5 (GLOVE) ×3 IMPLANT
GLOVE BIO SURGEONS STRL SZ 6.5 (GLOVE) ×2
GLOVE BIOGEL PI IND STRL 6.5 (GLOVE) ×2 IMPLANT
GLOVE BIOGEL PI IND STRL 7.0 (GLOVE) ×1 IMPLANT
GLOVE BIOGEL PI INDICATOR 6.5 (GLOVE) ×4
GLOVE BIOGEL PI INDICATOR 7.0 (GLOVE) ×2
GLOVE ECLIPSE 6.5 STRL STRAW (GLOVE) ×6 IMPLANT
GOWN STRL REUS W/ TWL LRG LVL3 (GOWN DISPOSABLE) ×4 IMPLANT
GOWN STRL REUS W/TWL LRG LVL3 (GOWN DISPOSABLE) ×8
GRAFT FLEX HD 6X16 PLIABLE (Tissue) ×6 IMPLANT
ILLUMINATOR WAVEGUIDE N/F (MISCELLANEOUS) IMPLANT
IMPL EXPANDER BREAST 535CC (Breast) ×2 IMPLANT
IMPLANT BREAST 535CC (Breast) ×4 IMPLANT
IMPLANT EXPANDER BREAST 535CC (Breast) ×2 IMPLANT
IV NS 1000ML (IV SOLUTION) ×2
IV NS 1000ML BAXH (IV SOLUTION) ×1 IMPLANT
IV NS 500ML (IV SOLUTION)
IV NS 500ML BAXH (IV SOLUTION) IMPLANT
KIT FILL ASEPTIC TRANSFER (MISCELLANEOUS) ×6 IMPLANT
KIT FILL SYSTEM UNIVERSAL (SET/KITS/TRAYS/PACK) IMPLANT
LIGHT WAVEGUIDE WIDE FLAT (MISCELLANEOUS) IMPLANT
NEEDLE HYPO 25X1 1.5 SAFETY (NEEDLE) ×3 IMPLANT
NS IRRIG 1000ML POUR BTL (IV SOLUTION) ×3 IMPLANT
PACK BASIN DAY SURGERY FS (CUSTOM PROCEDURE TRAY) ×3 IMPLANT
PENCIL SMOKE EVACUATOR (MISCELLANEOUS) ×3 IMPLANT
PIN SAFETY STERILE (MISCELLANEOUS) ×3 IMPLANT
PLASMABLADE 3.0S (MISCELLANEOUS) ×3
SLEEVE SCD COMPRESS KNEE MED (MISCELLANEOUS) ×3 IMPLANT
SPONGE LAP 18X18 RF (DISPOSABLE) ×12 IMPLANT
STRIP SUTURE WOUND CLOSURE 1/2 (MISCELLANEOUS) ×6 IMPLANT
SUT ETHILON 2 0 FS 18 (SUTURE) IMPLANT
SUT MNCRL AB 4-0 PS2 18 (SUTURE) ×9 IMPLANT
SUT MON AB 3-0 SH 27 (SUTURE) ×6
SUT MON AB 3-0 SH27 (SUTURE) ×3 IMPLANT
SUT MON AB 5-0 PS2 18 (SUTURE) ×9 IMPLANT
SUT PDS 3-0 CT2 (SUTURE) ×9
SUT PDS AB 2-0 CT2 27 (SUTURE) ×18 IMPLANT
SUT PDS II 3-0 CT2 27 ABS (SUTURE) ×3 IMPLANT
SUT SILK 2 0 SH (SUTURE) IMPLANT
SUT SILK 3 0 PS 1 (SUTURE) ×6 IMPLANT
SUT VIC AB 3-0 SH 27 (SUTURE)
SUT VIC AB 3-0 SH 27X BRD (SUTURE) IMPLANT
SUT VICRYL 3-0 CR8 SH (SUTURE) ×3 IMPLANT
SYR BULB EAR ULCER 2OZ BL STRL (SYRINGE) ×3 IMPLANT
SYR BULB IRRIG 60ML STRL (SYRINGE) IMPLANT
SYR CONTROL 10ML LL (SYRINGE) ×3 IMPLANT
TOWEL GREEN STERILE FF (TOWEL DISPOSABLE) ×6 IMPLANT
TRAY DSU PREP LF (CUSTOM PROCEDURE TRAY) IMPLANT
TRAY FOLEY W/BAG SLVR 14FR LF (SET/KITS/TRAYS/PACK) ×3 IMPLANT
TUBE CONNECTING 20'X1/4 (TUBING) ×1
TUBE CONNECTING 20X1/4 (TUBING) ×2 IMPLANT
UNDERPAD 30X36 HEAVY ABSORB (UNDERPADS AND DIAPERS) ×6 IMPLANT
YANKAUER SUCT BULB TIP NO VENT (SUCTIONS) ×3 IMPLANT

## 2020-03-13 NOTE — Anesthesia Postprocedure Evaluation (Signed)
Anesthesia Post Note  Patient: Stacey Singh  Procedure(s) Performed: BILATERAL MASTECTOMY WITH RIGHT SENTINEL LYMPH NODE BIOPSY (Bilateral Breast) BILATERAL BREAST RECONSTRUCTION WITH PLACEMENT OF TISSUE EXPANDERS AND FLEX HD (ACELLULAR HYDRATED DERMIS) (Bilateral Breast)     Patient location during evaluation: PACU Anesthesia Type: General Level of consciousness: awake and alert Pain management: pain level controlled Vital Signs Assessment: post-procedure vital signs reviewed and stable Respiratory status: spontaneous breathing, nonlabored ventilation and respiratory function stable Cardiovascular status: blood pressure returned to baseline and stable Postop Assessment: no apparent nausea or vomiting Anesthetic complications: no   No complications documented.  Last Vitals:  Vitals:   03/13/20 1315 03/13/20 1330  BP: 122/62 (!) 124/56  Pulse: 99 92  Resp: 19 (!) 23  Temp:    SpO2: 100% 99%    Last Pain:  Vitals:   03/13/20 1330  TempSrc:   PainSc: 3                  Candra R Derin Granquist

## 2020-03-13 NOTE — Anesthesia Preprocedure Evaluation (Addendum)
Anesthesia Evaluation  Patient identified by MRN, date of birth, ID band Patient awake    Reviewed: Allergy & Precautions, NPO status , Patient's Chart, lab work & pertinent test results  History of Anesthesia Complications Negative for: history of anesthetic complications  Airway Mallampati: II  TM Distance: >3 FB Neck ROM: Full    Dental  (+) Dental Advisory Given   Pulmonary neg pulmonary ROS,    Pulmonary exam normal        Cardiovascular hypertension, Pt. on medications Normal cardiovascular exam     Neuro/Psych negative neurological ROS  negative psych ROS   GI/Hepatic negative GI ROS, Neg liver ROS,   Endo/Other   Obesity   Renal/GU negative Renal ROS     Musculoskeletal negative musculoskeletal ROS (+)   Abdominal   Peds  Hematology negative hematology ROS (+)   Anesthesia Other Findings Covid test negative   Reproductive/Obstetrics  Right breast mass                             Anesthesia Physical Anesthesia Plan  ASA: II  Anesthesia Plan: General   Post-op Pain Management:  Regional for Post-op pain   Induction: Intravenous  PONV Risk Score and Plan: 3 and Treatment may vary due to age or medical condition, Ondansetron, Dexamethasone, Midazolam and Scopolamine patch - Pre-op  Airway Management Planned: Oral ETT  Additional Equipment: None  Intra-op Plan:   Post-operative Plan: Extubation in OR  Informed Consent: I have reviewed the patients History and Physical, chart, labs and discussed the procedure including the risks, benefits and alternatives for the proposed anesthesia with the patient or authorized representative who has indicated his/her understanding and acceptance.     Dental advisory given  Plan Discussed with: CRNA and Anesthesiologist  Anesthesia Plan Comments:        Anesthesia Quick Evaluation

## 2020-03-13 NOTE — H&P (Signed)
Stacey Singh  Location: [facility] Patient #: [id number] DOB: [birth year] Married / Language: English / Race: White Female   History of Present Illness  The patient is a 44 year old female who presents with breast cancer. We are asked to see the patient in consultation by Dr. Vanessa Kick to evaluate her for a new right breast cancer. The patient is a 44 year old white female who recently went for a routine screening mammogram. At that time she was found to have some scattered calcifications in the right breast and what seemed to be too small distinct clusters of calcifications around 1 cm or so in both the upper outer and upper inner quadrant. 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 She does have a family history of breast cancer in her mother and grandmother. She is otherwise in pretty good health and quit smoking about 12 years ago. She does not take any hormone replacement.   Past Surgical History  Breast Biopsy  Right. Cesarean Section - Multiple   Diagnostic Studies History  Colonoscopy  never Mammogram  within last year Pap Smear  1-5 years ago  Allergies Penicillins   Medication History  Olmesartan Medoxomil (40MG Tablet, Oral) Active. Medications Reconciled  Social History Alcohol use  Occasional alcohol use. Caffeine use  Carbonated beverages. No drug use  Tobacco use  Former smoker.  Family History Breast Cancer  Mother. Heart Disease  Father. Heart disease in female family member before age 87   Pregnancy / Birth History  Age at menarche  48 years. Contraceptive History  Oral contraceptives. Gravida  5 Length (months) of breastfeeding  12-24 Maternal age  28-35 Para  2 Regular periods   Other Problems  Breast Cancer  High blood pressure     Review of Systems General Not Present- Appetite Loss, Chills, Fatigue, Fever, Night Sweats, Weight Gain and  Weight Loss. Skin Not Present- Change in Wart/Mole, Dryness, Hives, Jaundice, New Lesions, Non-Healing Wounds, Rash and Ulcer. HEENT Not Present- Earache, Hearing Loss, Hoarseness, Nose Bleed, Oral Ulcers, Ringing in the Ears, Seasonal Allergies, Sinus Pain, Sore Throat, Visual Disturbances, Wears glasses/contact lenses and Yellow Eyes. Respiratory Not Present- Bloody sputum, Chronic Cough, Difficulty Breathing, Snoring and Wheezing. Breast Not Present- Breast Mass, Breast Pain, Nipple Discharge and Skin Changes. Cardiovascular Not Present- Chest Pain, Difficulty Breathing Lying Down, Leg Cramps, Palpitations, Rapid Heart Rate, Shortness of Breath and Swelling of Extremities. Gastrointestinal Not Present- Abdominal Pain, Bloating, Bloody Stool, Change in Bowel Habits, Chronic diarrhea, Constipation, Difficulty Swallowing, Excessive gas, Gets full quickly at meals, Hemorrhoids, Indigestion, Nausea, Rectal Pain and Vomiting. Female Genitourinary Not Present- Frequency, Nocturia, Painful Urination, Pelvic Pain and Urgency. Musculoskeletal Not Present- Back Pain, Joint Pain, Joint Stiffness, Muscle Pain, Muscle Weakness and Swelling of Extremities. Neurological Not Present- Decreased Memory, Fainting, Headaches, Numbness, Seizures, Tingling, Tremor, Trouble walking and Weakness. Psychiatric Not Present- Anxiety, Bipolar, Change in Sleep Pattern, Depression, Fearful and Frequent crying. Endocrine Not Present- Cold Intolerance, Excessive Hunger, Hair Changes, Heat Intolerance, Hot flashes and New Diabetes. Hematology Not Present- Blood Thinners, Easy Bruising, Excessive bleeding, Gland problems, HIV and Persistent Infections.  Vitals  Weight: 205.25 lb Height: 64in Body Surface Area: 1.98 m Body Mass Index: 35.23 kg/m  Temp.: 98.10F (Tympanic)  Pulse: 80 (Regular)  P.OX: 98% (Room air) BP: 140/90(Sitting, Left Arm, Standard)       Physical Exam  General Mental  Status-Alert. General Appearance-Consistent with stated age.  Hydration-Well hydrated. Voice-Normal.  Head and Neck Head-normocephalic, atraumatic with no lesions or palpable masses. Trachea-midline. Thyroid Gland Characteristics - normal size and consistency.  Eye Eyeball - Bilateral-Extraocular movements intact. Sclera/Conjunctiva - Bilateral-No scleral icterus.  Chest and Lung Exam Chest and lung exam reveals -quiet, even and easy respiratory effort with no use of accessory muscles and on auscultation, normal breath sounds, no adventitious sounds and normal vocal resonance. Inspection Chest Wall - Normal. Back - normal.  Breast Note: There is no palpable mass in either breast. There is no palpable axillary, supraclavicular, or cervical lymphadenopathy.   Cardiovascular Cardiovascular examination reveals -normal heart sounds, regular rate and rhythm with no murmurs and normal pedal pulses bilaterally.  Abdomen Inspection Inspection of the abdomen reveals - No Hernias. Skin - Scar - no surgical scars. Palpation/Percussion Palpation and Percussion of the abdomen reveal - Soft, Non Tender, No Rebound tenderness, No Rigidity (guarding) and No hepatosplenomegaly. Auscultation Auscultation of the abdomen reveals - Bowel sounds normal.  Neurologic Neurologic evaluation reveals -alert and oriented x 3 with no impairment of recent or remote memory. Mental Status-Normal.  Musculoskeletal Normal Exam - Left-Upper Extremity Strength Normal and Lower Extremity Strength Normal. Normal Exam - Right-Upper Extremity Strength Normal and Lower Extremity Strength Normal.  Lymphatic Head & Neck  General Head & Neck Lymphatics: Bilateral - Description - Normal. Axillary  General Axillary Region: Bilateral - Description - Normal. Tenderness - Non Tender. Femoral & Inguinal  Generalized Femoral & Inguinal Lymphatics: Bilateral - Description - Normal.  Tenderness - Non Tender.    Assessment & Plan  DUCTAL CARCINOMA IN SITU (DCIS) OF RIGHT BREAST (D05.11) Impression: The patient appears to have 2 small areas of DCIS in the upper inner and upper outer right breast. I have discussed with her in detail the different options for treatment. At this point because of the intractability of DCIS in the question of scattered calcifications in the breast we will plan to evaluate her with an MRI of both breasts. If this looks like 2 small areas of DCIS then she seems to be favoring breast conservation. In this case she would not need a node evaluation. If the area looks like a much larger confluence of DCIS then she may have to consider mastectomy to remove the area in which case we would also do the node evaluation of the same time. I have discussed with her in detail the risks and benefits of the operation as well as some of the technical aspects including the possibility of using radioactive seeds for localization and she understands and wishes to proceed. We will call her with the results of the MRI study as soon as it is available. We'll also go ahead and make a referral to medical and radiation oncology to discuss adjuvant therapy. She may also qualify for genetics. This patient encounter took 60 minutes today to perform the following: take history, perform exam, review outside records, interpret imaging, counsel the patient on their diagnosis and document encounter, findings & plan in the EHR Current Plans Referred to Oncology, for evaluation and follow up (Oncology). Routine. MRI, breast, w/o contrast material; bilateral(77047)(ACR 0 )(DSN [id number])(G-Code G1004(MG)) (Clinical Scenarios: No content available for this procedure) Referred to Genetic Counseling, for evaluation and follow up [employer]). Routine.

## 2020-03-13 NOTE — Anesthesia Procedure Notes (Signed)
Anesthesia Regional Block: Pectoralis block   Pre-Anesthetic Checklist: ,, timeout performed, Correct Patient, Correct Site, Correct Laterality, Correct Procedure, Correct Position, site marked, Risks and benefits discussed,  Surgical consent,  Pre-op evaluation,  At surgeon's request and post-op pain management  Laterality: Left  Prep: chloraprep       Needles:  Injection technique: Single-shot  Needle Type: Echogenic Needle     Needle Length: 10cm  Needle Gauge: 21     Additional Needles:   Narrative:  Start time: 03/13/2020 8:07 AM End time: 03/13/2020 8:11 AM Injection made incrementally with aspirations every 5 mL.  Performed by: Personally  Anesthesiologist: Audry Pili, MD  Additional Notes: No pain on injection. No increased resistance to injection. Injection made in 5cc increments. Good needle visualization. Patient tolerated the procedure well.

## 2020-03-13 NOTE — Interval H&P Note (Signed)
History and Physical Interval Note:  03/13/2020 7:59 AM  Stacey Singh  has presented today for surgery, with the diagnosis of RIGHT BREAST DUCTAL [place].  The various methods of treatment have been discussed with the patient and family. After consideration of risks, benefits and other options for treatment, the patient has consented to  Procedure(s) with comments: BILATERAL MASTECTOMY WITH RIGHT SENTINEL LYMPH NODE BIOPSY (Bilateral) - PEC BLOCK BREAST RECONSTRUCTION WITH PLACEMENT OF TISSUE EXPANDER AND FLEX HD (ACELLULAR HYDRATED DERMIS) (Bilateral) as a surgical intervention.  The patient's history has been reviewed, patient examined, no change in status, stable for surgery.  I have reviewed the patient's chart and labs.  Questions were answered to the patient's satisfaction.     Loel Lofty Kinzlie Harney

## 2020-03-13 NOTE — Discharge Instructions (Addendum)
INSTRUCTIONS FOR AFTER BREAST SURGERY   You are getting ready to undergo breast surgery.  You will likely have some questions about what to expect following your operation.  The following information will help you and your family understand what to expect when you are discharged from the hospital.  Following these guidelines will help ensure a smooth recovery and reduce risks of complications.   Postoperative instructions include information on: diet, wound care, medications and physical activity.  AFTER SURGERY Expect to go home after the procedure.  In some cases, you may need to spend one night in the hospital for observation.  DIET Breast surgery does not require a specific diet.  However, the healthier you eat the better your body can start healing. It is important to increasing your protein intake.  This means limiting the foods with sugar and carbohydrates.  Focus on vegetables and some meat.  If you have any liposuction during your procedure be sure to drink water.  If your urine is bright yellow, then it is concentrated, and you need to drink more water.  As a general rule after surgery, you should have 8 ounces of water every hour while awake.  If you find you are persistently nauseated or unable to take in liquids let us know.  NO TOBACCO USE or EXPOSURE.  This will slow your healing process and increase the risk of a wound.  WOUND CARE If you have a drain: You can shower five days after surgery.  Clean with baby wipes until the drain is removed.    If you have steri-strips / tape directly attached to your skin leave them in place. It is OK to get these wet.  No baths, pools or hot tubs for two weeks. We close your incision to leave the smallest and best-looking scar. No ointment or creams on your incisions until given the go ahead.  Especially not Neosporin (Too many skin reactions with this one).  A few weeks after surgery you can use Mederma and start massaging the scar. We ask you to  wear your binder or sports bra for the first 6 weeks around the clock, including while sleeping. This provides added comfort and helps reduce the fluid accumulation at the surgery site.  ACTIVITY No heavy lifting until cleared by the doctor.  This usually means no more than a half-gallon of milk.  It is OK to walk and climb stairs. In fact, moving your legs is very important to decrease your risk of a blood clot.  It will also help keep you from getting deconditioned.  Every 1 to 2 hours get up and walk for 5 minutes. This will help with a quicker recovery back to normal.  Let pain be your guide so you don't do too much.  This is not the time for spring cleaning and don't plan on taking care of anyone else.  This time is for you to recover,  You will be more comfortable if you sleep and rest with your head elevated either with a few pillows under you or in a recliner.  No stomach sleeping for a three months.  WORK Everyone returns to work at different times. As a rough guide, most people take at least 1 - 2 weeks off prior to returning to work. If you need documentation for your job, bring the forms to your postoperative follow up visit.  DRIVING Arrange for someone to bring you home from the hospital.  You may be able to drive a  few days after surgery but not while taking any narcotics or valium.  BOWEL MOVEMENTS Constipation can occur after anesthesia and while taking pain medication.  It is important to stay ahead for your comfort.  We recommend taking Milk of Magnesia (2 tablespoons; twice a day) while taking the pain pills.  SEROMA This is fluid your body tried to put in the surgical site.  This is normal but if it creates tight skinny skin let us know.  It usually decreases in a few weeks.  MEDICATIONS and PAIN CONTROL At your preoperative visit for you history and physical you were given the following medications: 1. An antibiotic: Start this medication when you get home and take according  to the instructions on the bottle. 2. Zofran 4 mg:  This is to treat nausea and vomiting.  You can take this every 6 hours as needed and only if needed. 3. Valium 2 mg: This is for muscle tightness if you have an implant or expander. This will help relax your muscle which also helps with pain control.  This can be taken every 12 hours as needed.  Don't drive after taking this medication. 4. Norco (hydrocodone/acetaminophen) 5/325 mg:  This is only to be used after you have taken the motrin or the tylenol. Every 8 hours as needed. Over the counter Medication to take: 5. Ibuprofen (Motrin) 600 mg:  Take this every 6 hours.  If you have additional pain then take 500 mg of the tylenol every 6 hours.  Only take the Norco after you have tried these two. 6. Miralax or stool softener of choice: Take this according to the bottle if you take the [place] Call your surgeon's office if any of the following occur: . Fever 101 degrees F or greater . Excessive bleeding or fluid from the incision site. . Pain that increases over time without aid from the medications . Redness, warmth, or pus draining from incision sites . Persistent nausea or inability to take in liquids . Severe misshapen area that underwent the operation.  Here are some resources:  1. Plastic surgery website: https://www.plasticsurgery.org/for-medical-professionals/education-and-resources/publications/breast-reconstruction-magazine 2. Breast Reconstruction Awareness Campaign:  [employer]elLives.co.nz 3. Plastic surgery Implant information:  https://www.plasticsurgery.org/patient-safety/breast-implant-safety   [facility] Plastic Surgery Specialist  What is the benefit of having a drain?  During surgery your tissue layers are separated.  This raw surface stimulates your body to fill the space with serous fluid.  This is normal but you don't want that fluid to collect and prevent healing.  A fluid collection can also become  infected.  The Jackson-Pratt (JP) drain is used to eliminate this collection of fluid and allow the tissue to heal together.    Jackson-Pratt (JP) bulb    How to care for your drainage and suction unit at home Your drainage catheter will be connected to a collection device. The vacuum caused when the device is compressed allows drainage to collect in the device.    Wendee Copp your hands with soap and water before and after touching the system. . Empty the JP drain every 12 hours once you get home from your procedure. . Record the fluid amount on the record sheet included. . Start with stripping the drain tube to push the clots or excess fluid to the bulb.  Do this by pinching the tube with one hand near your skin.  Then with the other hand squeeze the tubing and work it toward the bulb.  This should be done several times a  day.  This may collapse the tube which will correct on its own.   . Use a safety pin to attach your collection device to your clothing so there is no tension on the insertion site.   . If you have drainage at the skin insertion site, you can apply a gauze dressing and secure it with tape. . If the drain falls out, apply a gauze dressing over the drain insertion site and secure with tape.   To empty the collection device:   . Release the stopper on the top of the collection unit (bulb).  Signa Kell contents into a measuring container such as a plastic medicine cup.  . Record the day and amount of drainage on the attached sheet. . This should be done at least twice a day.    To compress the Jackson-Pratt Bulb:  . Release the stopper at the top of the bulb. Marland Kitchen Squeeze the bulb tightly in your fist, squeezing air out of the bulb.  . Replace the stopper while the bulb is compressed.  . Be careful not to spill the contents when squeezing the bulb. . The drainage will start bright red and turn to pink and then yellow with time. . IMPORTANT: If the bulb is not squeezed before adding the  stopper it will not draw out the fluid.  Care for the JP drain site and your skin daily:  . You may shower three days after surgery. . Secure the drain to a ribbon or cloth around your waist while showering so it does not pull out while showering. . Be sure your hands are cleaned with soap and water. . Use a clean wet cotton swab to clean the skin around the drain site.  . Use another cotton swab to place Vaseline or antibiotic ointment on the skin around the drain.     Contact your physician if any of the following occur:  Marland Kitchen The fluid in the bulb becomes cloudy. . Your temperature is greater than 101.4.  Marland Kitchen The incision opens. . If you have drainage at the skin insertion site, you can apply a gauze dressing and secure it with tape. . If the drain falls out, apply a gauze dressing over the drain insertion site and secure with tape.  . You will usually have more drainage when you are active than while you rest or are asleep. If the drainage increases significantly or is bloody call the physician                             Bring this record with you to each office visit Date  Drainage Volume  Date   Drainage volume  Information for Discharge Teaching: EXPAREL (bupivacaine liposome injectable suspension)   Your surgeon or anesthesiologist gave you EXPAREL(bupivacaine) to help control your pain after surgery.   EXPAREL is a local anesthetic that provides pain relief by numbing the tissue around the surgical site.  EXPAREL is designed to release pain medication over time and can control pain for up to 72 hours.  Depending on how you respond to EXPAREL, you may require less pain medication during your recovery.  Possible side effects:  Temporary loss of sensation or ability to move in  the area where bupivacaine was injected.  Nausea, vomiting, constipation  Rarely, numbness and tingling in your mouth or lips, lightheadedness, or anxiety may occur.  Call your doctor right away if you think you may be experiencing any of these sensations, or if you have other questions regarding possible side effects.  Follow all other discharge instructions given to you by your surgeon or nurse. Eat a healthy diet and drink plenty of water or other fluids.  If you return to the hospital for any reason within 96 hours following the administration of EXPAREL, it is important for health care providers to know that you have received this anesthetic. A teal colored band has been placed on your arm with the date, time and amount of EXPAREL you have received in order to alert and inform your health care providers. Please leave this armband in place for the full 96 hours following administration, and then you may remove the band.   Post Anesthesia Home Care Instructions  Activity: Get plenty of rest for the remainder of the day. A responsible individual must stay with you for 24 hours following the procedure.  For the next 24 hours, DO NOT: -Drive a car -Paediatric nurse -Drink alcoholic beverages -Take any medication unless instructed by your physician -Make any legal decisions or sign important papers.  Meals: Start with liquid foods such as gelatin or soup. Progress to regular foods as tolerated. Avoid greasy, spicy, heavy foods. If nausea and/or vomiting occur, drink only clear liquids until the nausea and/or vomiting subsides. Call your physician if vomiting continues.  Special Instructions/Symptoms: Your throat may feel dry or sore from the anesthesia or the breathing tube placed in your throat during surgery. If this causes discomfort, gargle with warm salt water. The discomfort should disappear within 24 hours.  If you had a scopolamine patch placed behind your ear for the management  of post- operative nausea and/or vomiting:  1. The medication in the patch is effective for 72 hours, after which it should be removed.  Wrap patch in a tissue and discard in the trash. Wash hands thoroughly with soap and water. 2. You may remove the patch earlier than 72 hours if you experience unpleasant side effects which may include dry mouth, dizziness or visual disturbances. 3. Avoid touching the patch. Wash your hands with soap and water after contact with the patch.

## 2020-03-13 NOTE — Anesthesia Procedure Notes (Signed)

## 2020-03-13 NOTE — Op Note (Signed)
Op report    DATE OF OPERATION:  03/13/2020  LOCATION: [place]  SURGICAL DIVISION: Plastic Surgery  PREOPERATIVE DIAGNOSES:  1. Right Breast cancer.    POSTOPERATIVE DIAGNOSES:  1. Right Breast cancer.   PROCEDURE:  1. Bilateral immediate breast reconstruction with placement of Acellular Dermal Matrix and tissue expanders.  SURGEON: Joleena Weisenburger Sanger Jayde Mcallister, DO  ASSISTANT: Phoebe Sharps, PA  ANESTHESIA:  General.   COMPLICATIONS: None.   IMPLANTS: Left - Mentor 535 cc. Ref #[id number].  Serial Number [id number], 250 cc of injectable saline placed in the expander. Right - Mentor 535 cc. Ref #[id number].  Serial Number [id number], 250 cc of injectable saline placed in the expander. Acellular Dermal Matrix 6 x 16 cm two  INDICATIONS FOR PROCEDURE:  The patient, Stacey Singh, is a 44 y.o. female born on October 24, 1975, is here for  immediate first stage breast reconstruction with placement of bilateral tissue expander and Acellular dermal matrix. MRN: [id number]  CONSENT:  Informed consent was obtained directly from the patient. Risks, benefits and alternatives were fully discussed. Specific risks including but not limited to bleeding, infection, hematoma, seroma, scarring, pain, implant infection, implant extrusion, capsular contracture, asymmetry, wound healing problems, and need for further surgery were all discussed. The patient did have an ample opportunity to have her questions answered to her satisfaction.   DESCRIPTION OF PROCEDURE:  The patient was taken to the operating room by the general surgery team. SCDs were placed and IV antibiotics were given. The patient's chest was prepped and draped in a sterile fashion. A time out was performed and the implants to be used were identified.  Bilateral mastectomies were performed.  Once the general surgery team had completed their portion of the case the patient was rendered to the plastic and reconstructive  surgery team.  Right:  The pectoralis major muscle was lifted from the chest wall with release of the lateral edge and lateral inframammary fold.  The pocket was irrigated with antibiotic solution and hemostasis was achieved with electrocautery.  The ADM was then prepared according to the manufacture guidelines and slits placed to help with postoperative fluid management.  The ADM was then sutured to the inferior and lateral edge of the inframammary fold with 2-0 PDS starting with an interrupted stitch and then a running stitch.  The lateral portion was sutured to with interrupted sutures after the expander was placed.  The expander was prepared according to the manufacture guidelines, the air evacuated and then it was placed under the ADM and pectoralis major muscle.  The inferior and lateral tabs were used to secure the expander to the chest wall with 2-0 PDS.  The drain was placed at the inframammary fold over the ADM and secured to the skin with 3-0 Silk.  The deep layers were closed with 3-0 Monocryl followed by 4-0 Monocryl.  The skin was closed with 5-0 Monocryl and then dermabond was applied.   Left:  The pectoralis major muscle was lifted from the chest wall with release of the lateral edge and lateral inframammary fold.  The pocket was irrigated with antibiotic solution and hemostasis was achieved with electrocautery.  The ADM was then prepared according to the manufacture guidelines and slits placed to help with postoperative fluid management.  The ADM was then sutured to the inferior and lateral edge of the inframammary fold with 2-0 PDS starting with an interrupted stitch and then a running stitch.  The lateral portion was sutured to with interrupted sutures after  the expander was placed.  The expander was prepared according to the manufacture guidelines, the air evacuated and then it was placed under the ADM and pectoralis major muscle.  The inferior and lateral tabs were used to secure the  expander to the chest wall with 2-0 PDS.  The drain was placed at the inframammary fold over the ADM and secured to the skin with 3-0 Silk.    The deep layers were closed with 3-0 Monocryl followed by 4-0 Monocryl.  The skin was closed with 5-0 Monocryl and then dermabond was applied.  The ABDs and breast binder were placed.  The patient tolerated the procedure well and there were no complications.  The patient was allowed to wake from anesthesia and taken to the recovery room in satisfactory condition.   The advanced practice practitioner (APP) assisted throughout the case.  The APP was essential in retraction and counter traction when needed to make the case progress smoothly.  This retraction and assistance made it possible to see the tissue plans for the procedure.  The assistance was needed for blood control, tissue re-approximation and assisted with closure of the incision site.

## 2020-03-13 NOTE — Anesthesia Procedure Notes (Signed)
Anesthesia Regional Block: Pectoralis block   Pre-Anesthetic Checklist: ,, timeout performed, Correct Patient, Correct Site, Correct Laterality, Correct Procedure, Correct Position, site marked, Risks and benefits discussed,  Surgical consent,  Pre-op evaluation,  At surgeon's request and post-op pain management  Laterality: Right  Prep: chloraprep       Needles:  Injection technique: Single-shot  Needle Type: Echogenic Needle     Needle Length: 10cm  Needle Gauge: 21     Additional Needles:   Narrative:  Start time: 03/13/2020 8:03 AM End time: 03/13/2020 8:07 AM Injection made incrementally with aspirations every 5 mL.  Performed by: Personally  Anesthesiologist: Audry Pili, MD  Additional Notes: No pain on injection. No increased resistance to injection. Injection made in 5cc increments. Good needle visualization. Patient tolerated the procedure well.

## 2020-03-13 NOTE — Op Note (Signed)
03/13/2020  11:09 AM  PATIENT:  Stacey Singh  44 y.o. female  PRE-OPERATIVE DIAGNOSIS:  RIGHT BREAST DUCTAL CARCNOMA IN SITU  POST-OPERATIVE DIAGNOSIS:  RIGHT BREAST DUCTAL CARCNOMA IN SITU  PROCEDURE:  Procedure(s) with comments: RIGHT MASTECTOMY WITH DEEP RIGHT AXILLARY SENTINEL LYMPH NODE BIOPSY  LEFT PROPHYLACTIC MASTECTOMY  SURGEON:  Surgeon(s) and Role: Panel 1:    * Jovita Kussmaul, MD - Primary  PHYSICIAN ASSISTANT:   ASSISTANTS: none   ANESTHESIA:   general  EBL:  45 mL   BLOOD ADMINISTERED:none  DRAINS: none   LOCAL MEDICATIONS USED:  NONE  SPECIMEN:  Source of Specimen:  right mastectomy with sentinel node, left mastectomy  DISPOSITION OF SPECIMEN:  PATHOLOGY  COUNTS:  YES  TOURNIQUET:  * No tourniquets in log *  DICTATION: .Dragon Dictation   After informed consent was obtained the patient was brought to the operating room and placed in the supine position on the operating table.  After adequate induction of general anesthesia the patient's bilateral chest, breast, and axillary areas were prepped with ChloraPrep, allowed to dry, and draped in usual sterile manner.  An appropriate timeout was performed.  The patient had a large area of DCIS in the right breast.  She has elected for bilateral mastectomies and right sentinel node biopsy.  Earlier in the day the patient underwent injection 1 mCi of technetium sulfur colloid on the right.  Attention was first turned to the left breast.  An elliptical incision was made around the nipple and areola complex in order to maximize the skin.  The incision was carried through the skin and subcutaneous tissue sharply with the PlasmaBlade.  Breast hooks were used to elevate the skin flaps anteriorly towards the ceiling.  Thin skin flaps were then created circumferentially between the breast tissue and the subcutaneous fat.  This dissection was carried out circumferentially all the way to the chest wall.  Next the breast was  removed from the pectoralis muscle with the pectoralis fascia.  Once this was accomplished the entire breast was removed.  It was marked with a stitch on the lateral skin and sent to pathology for further evaluation.  Hemostasis was achieved using the PlasmaBlade.  The wound was then packed with a moistened lap sponge.  Attention was then turned to the right breast.  The neoprobe was set to technetium and an area of radioactivity was readily identified in the right axilla.  A similar elliptical incision was made around the nipple areolar complex in order to maximize the skin.  The incision was carried through the skin and subcutaneous tissue sharply with the PlasmaBlade.  Breast hooks were used to elevate the skin flaps anteriorly towards the ceiling.  Thin skin flaps were then created circumferentially between the breast tissue and the subcutaneous fat.  This dissection was carried all the way to the chest wall circumferentially with the PlasmaBlade.  Next the breast was removed from the pectoralis muscle with the pectoralis fascia.  Once this was accomplished the entire breast was removed and marked with a stitch on the lateral skin.  The breast was sent to pathology for further evaluation.  The neoprobe was used to direct blunt hemostat dissection into the deep right axillary space.  I was able to identify 1 lymph node that had increased radioactivity.  This was excised sharply with the PlasmaBlade.  Ex vivo counts on this node were approximately 150.  No other hot or palpable nodes were identified in the right axilla.  Hemostasis was achieved using the PlasmaBlade.  The wound was then packed with a moistened lap sponge.  The patient tolerated the procedure well.  All needle sponge and instrument counts were correct.  At this point the operation was turned over to Dr. Marla Roe for the reconstruction.  Her portion of the case will be dictated separately.  PLAN OF CARE: Admit for overnight  observation  PATIENT DISPOSITION:  PACU - hemodynamically stable.   Delay start of Pharmacological VTE agent (>24hrs) due to surgical blood loss or risk of bleeding: no

## 2020-03-13 NOTE — Transfer of Care (Signed)
Immediate Anesthesia Transfer of Care Note  Patient: Stacey Singh  Procedure(s) Performed: BILATERAL MASTECTOMY WITH RIGHT SENTINEL LYMPH NODE BIOPSY (Bilateral Breast) BILATERAL BREAST RECONSTRUCTION WITH PLACEMENT OF TISSUE EXPANDERS AND FLEX HD (ACELLULAR HYDRATED DERMIS) (Bilateral Breast)  Patient Location: PACU  Anesthesia Type:GA combined with regional for post-op pain  Level of Consciousness: awake, alert , oriented, drowsy and patient cooperative  Airway & Oxygen Therapy: Patient Spontanous Breathing and Patient connected to face mask oxygen  Post-op Assessment: Report given to RN and Post -op Vital signs reviewed and stable  Post vital signs: Reviewed and stable  Last Vitals:  Vitals Value Taken Time  BP    Temp    Pulse 117 03/13/20 1251  Resp 18 03/13/20 1251  SpO2 100 % 03/13/20 1251  Vitals shown include unvalidated device data.  Last Pain:  Vitals:   03/13/20 0716  TempSrc: Oral  PainSc:       Patients Stated Pain Goal: 3 (07/62/26 3335)  Complications: No complications documented.

## 2020-03-13 NOTE — Progress Notes (Signed)
Assisted Dr. Fransisco Beau with right, left, ultrasound guided, pectoralis block. Side rails up, monitors on throughout procedure. See vital signs in flow sheet. Tolerated Procedure well.

## 2020-03-13 NOTE — Progress Notes (Signed)
Nuc med injections completed. Patient tolerated well.

## 2020-03-13 NOTE — Interval H&P Note (Signed)
History and Physical Interval Note:  03/13/2020 8:16 AM  Stacey Singh  has presented today for surgery, with the diagnosis of RIGHT BREAST DUCTAL [place].  The various methods of treatment have been discussed with the patient and family. After consideration of risks, benefits and other options for treatment, the patient has consented to  Procedure(s) with comments: BILATERAL MASTECTOMY WITH RIGHT SENTINEL LYMPH NODE BIOPSY (Bilateral) - PEC BLOCK BREAST RECONSTRUCTION WITH PLACEMENT OF TISSUE EXPANDER AND FLEX HD (ACELLULAR HYDRATED DERMIS) (Bilateral) as a surgical intervention.  The patient's history has been reviewed, patient examined, no change in status, stable for surgery.  I have reviewed the patient's chart and labs.  Questions were answered to the patient's satisfaction.     Autumn Messing III

## 2020-03-14 ENCOUNTER — Encounter (HOSPITAL_BASED_OUTPATIENT_CLINIC_OR_DEPARTMENT_OTHER): Payer: Self-pay | Admitting: General Surgery

## 2020-03-14 DIAGNOSIS — D0511 Intraductal carcinoma in situ of right breast: Secondary | ICD-10-CM | POA: Diagnosis not present

## 2020-03-14 NOTE — Discharge Summary (Signed)
Physician Discharge Summary  Patient ID: TYMIA STREB MRN: [id number] DOB/AGE: 1975/08/26 44 y.o.  Admit date: 03/13/2020 Discharge date: 03/14/2020  Admission Diagnoses: Breast Cancer  Discharge Diagnoses:  Active Problems:   Breast cancer [facility])   Discharged Condition: good  Hospital Course: This morning Ms. Bulow is laying in bed resting comfortably.  No overnight events.  She reports pain has been well controlled on Tylenol and ibuprofen.  Bilateral drains in place with serosanguineous fluid in the bulbs.  Dressings in place over incisions.  No signs of infection, redness, drainage, seroma/hematoma.  Patient denies headache, chest pain, shortness of breath, nausea/vomiting.  Consults: None  Significant Diagnostic Studies: none  Treatments: surgery: Bilateral mastectomy with right sentinel lymph node biopsy and reconstruction with placement of tissue expanders and Flex HD  Discharge Exam: Blood pressure (!) 114/56, pulse 75, temperature 97.9 F (36.6 C), resp. rate 18, height 5' 3"  (1.6 m), weight 94.1 kg, last menstrual period 02/21/2020, SpO2 99 %. General appearance: alert, cooperative and no distress Head: Normocephalic, without obvious abnormality, atraumatic Eyes: EOMs intact Resp: nonlabored Chest wall: Symmetric rise and fall.  Bilateral drains in place with serosanguineous fluid in bulbs.  Dressings in place bilaterally.  No signs of redness, infection, drainage, seroma/hematoma.  Disposition: Discharge disposition: 01-Home or Self Care       Discharge Instructions    Call MD for:  difficulty breathing, headache or visual disturbances   Complete by: As directed    Call MD for:  extreme fatigue   Complete by: As directed    Call MD for:  hives   Complete by: As directed    Call MD for:  persistant dizziness or light-headedness   Complete by: As directed    Call MD for:  persistant nausea and vomiting   Complete by: As directed    Call MD for:  redness,  tenderness, or signs of infection (pain, swelling, redness, odor or green/yellow discharge around incision site)   Complete by: As directed    Call MD for:  severe uncontrolled pain   Complete by: As directed    Call MD for:  temperature >100.4   Complete by: As directed    Diet - low sodium heart healthy   Complete by: As directed    Increase activity slowly   Complete by: As directed      Allergies as of 03/14/2020      Reactions   Penicillins    Penicillins    Childhood reaction      Medication List    TAKE these medications   diazepam 2 MG tablet Commonly known as: Valium Take 1 tablet (2 mg total) by mouth every 12 (twelve) hours as needed for muscle spasms.   olmesartan 40 MG tablet Commonly known as: Benicar Take 1 tablet (40 mg total) by mouth daily.   ondansetron 4 MG tablet Commonly known as: Zofran Take 1 tablet (4 mg total) by mouth every 8 (eight) hours as needed for nausea or vomiting.   tamoxifen 20 MG tablet Commonly known as: NOLVADEX Take 1 tablet (20 mg total) by mouth daily.       Follow-up Information    Dillingham, Loel Lofty, DO In 1 week.   Specialty: Plastic Surgery Contact information: [address] [place] [address] [phone]        Autumn Messing III, MD In 2 weeks.   Specialty: General Surgery Contact information: [place] [address] [phone]  Signed: Threasa Heads 03/14/2020, 7:33 AM

## 2020-03-15 LAB — SURGICAL PATHOLOGY

## 2020-03-18 ENCOUNTER — Encounter: Payer: Self-pay | Admitting: *Deleted

## 2020-03-18 ENCOUNTER — Telehealth: Payer: Self-pay | Admitting: Genetic Counselor

## 2020-03-18 NOTE — Telephone Encounter (Signed)
Called Stacey Singh regarding the option for additional genetic testing, as she was not sure if she was interested when we initially reviewed her genetic test results. LVM requesting that she call back to discuss if she is interested.

## 2020-03-21 ENCOUNTER — Telehealth: Payer: Self-pay | Admitting: Licensed Clinical Social Worker

## 2020-03-21 NOTE — Telephone Encounter (Signed)
[facility]  CSW attempted to call patient to check on coping post-surgery. No answer, left VM with direct contact information.   Edwinna Areola Jabril Pursell, LCSW

## 2020-03-22 ENCOUNTER — Ambulatory Visit (INDEPENDENT_AMBULATORY_CARE_PROVIDER_SITE_OTHER): Payer: BC Managed Care – PPO | Admitting: Plastic Surgery

## 2020-03-22 ENCOUNTER — Encounter: Payer: Self-pay | Admitting: Plastic Surgery

## 2020-03-22 ENCOUNTER — Other Ambulatory Visit: Payer: Self-pay

## 2020-03-22 DIAGNOSIS — Z9010 Acquired absence of unspecified breast and nipple: Secondary | ICD-10-CM | POA: Insufficient documentation

## 2020-03-22 DIAGNOSIS — Z9013 Acquired absence of bilateral breasts and nipples: Secondary | ICD-10-CM

## 2020-03-22 NOTE — Progress Notes (Signed)
   Subjective:    Patient ID: Stacey Singh, female    DOB: [birth year], 44 y.o.   MRN: [id number]  Patient is a 44 year old female here with her husband for follow-up after undergoing bilateral mastectomies last week.  Her drain output is as expected and too high for removal.  There were a couple of loculations I was able to massage and get out of the drain.  There is no sign of infection or hematoma.  Her incisions are closed and appear to be healing nicely.  Her bowels are moving and her pain is very well controlled.  She had expanders placed with 250 cc at the time of surgery.     Review of Systems  Constitutional: Negative.   Eyes: Negative.   Respiratory: Negative.   Cardiovascular: Negative.   Genitourinary: Negative.        Objective:   Physical Exam Vitals and nursing note reviewed.  Constitutional:      Appearance: Normal appearance.  Cardiovascular:     Rate and Rhythm: Normal rate.     Pulses: Normal pulses.  Pulmonary:     Effort: Pulmonary effort is normal.  Neurological:     General: No focal deficit present.     Mental Status: She is alert. Mental status is at baseline.  Psychiatric:        Mood and Affect: Mood normal.        Behavior: Behavior normal.           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  She can shower and we will plan on getting the drain out at her next visit.

## 2020-03-24 NOTE — Progress Notes (Signed)
Patient Care Team: Mittie Bodo as PCP - General (Physician Assistant) Mauro Kaufmann, RN as Oncology Nurse Navigator Rockwell Germany, RN as Oncology Nurse Navigator Nicholas Lose, MD as Consulting Physician (Hematology and Oncology)  DIAGNOSIS:    ICD-10-CM   1. Ductal carcinoma in situ (DCIS) of right breast  D05.11     SUMMARY OF ONCOLOGIC HISTORY: Oncology History  Ductal carcinoma in situ (DCIS) of right breast  12/15/2019 Cancer Staging   Staging form: Breast, AJCC 8th Edition - Clinical stage from 12/15/2019: Stage 0 (cTis (DCIS), cN0, cM0, ER+, PR+) - Signed by Gardenia Phlegm, NP on 12/27/2019   12/15/2019 Initial Diagnosis   Screening mammogram detected new loosely grouped pleomorphic calcifications throughout the right breast.  2 biopsies performed.  UIQ: Intermediate grade DCIS with necrosis, UOQ: Intermediate grade DCIS with necrosis, both are ER 100%, PR 100%   01/05/2020 Genetic Testing   Negative genetic testing:  No pathogenic variants detected on the [facility] Breast Cancer STAT panel. The report date is 01/05/2020.  The Breast Cancer STAT Panel offered by [facility] includes sequencing and deletion/duplication analysis for the following 9 genes:  ATM, BRCA1, BRCA2, CDH1, CHEK2, PALB2, PTEN, STK11 and TP53.   03/13/2020 Surgery   Bilateral mastectomies Marlou Starks & Dillingham): Left breast: no evidence of malignancy Right breast: DCIS with necrosis and calcifications, two right axillary lymph nodes negative for carcinoma.     CHIEF COMPLIANT: Follow-up s/p lumpectomy to review pathology  INTERVAL HISTORY: Stacey Singh is a 44 y.o. with above-mentioned history of right breast DCIS. She underwent bilateral mastectomies with Dr. Marlou Starks and Dr. Marla Roe on 03/13/20 for which pathology showed in the left breast, no evidence of malignancy, and in the right breast, DCIS with necrosis and calcifications, two right axillary lymph nodes negative for carcinoma. She  presents to the clinic today to review the pathology report and discuss further treatment.   ALLERGIES:  is allergic to penicillins and penicillins.  MEDICATIONS:  Current Outpatient Medications  Medication Sig Dispense Refill  . diazepam (VALIUM) 2 MG tablet Take 1 tablet (2 mg total) by mouth every 12 (twelve) hours as needed for muscle spasms. 20 tablet 0  . olmesartan (BENICAR) 40 MG tablet Take 1 tablet (40 mg total) by mouth daily. 90 tablet 3  . ondansetron (ZOFRAN) 4 MG tablet Take 1 tablet (4 mg total) by mouth every 8 (eight) hours as needed for nausea or vomiting. 20 tablet 0  . tamoxifen (NOLVADEX) 20 MG tablet Take 1 tablet (20 mg total) by mouth daily. 90 tablet 3   No current facility-administered medications for this visit.    PHYSICAL EXAMINATION: ECOG PERFORMANCE STATUS: 1 - Symptomatic but completely ambulatory  There were no vitals filed for this visit. There were no vitals filed for this visit.  LABORATORY DATA:  I have reviewed the data as listed CMP Latest Ref Rng & Units 12/29/2017 08/27/2016 06/13/2015  Glucose 65 - 99 mg/dL 104(H) 81 94  BUN 6 - 24 mg/dL _0 Creatinine 0.57 - 1.00 mg/dL 0.68 0.57 0.56  Sodium 134 - 144 mmol/L 143 139 142  Potassium 3.5 - 5.2 mmol/L 4.5 4.0 4.2  Chloride 96 - 106 mmol/L 107(H) 100 106  CO2 20 - 29 mmol/L _1 Calcium 8.7 - 10.2 mg/dL 9.4 8.7 9.0  Total Protein 6.0 - 8.5 g/dL 6.7 7.0 6.5  Total Bilirubin 0.0 - 1.2 mg/dL 0.2 0.3 0.3  Alkaline Phos 39 -  117 IU/L 66 80 79  AST 0 - 40 IU/L _0 ALT 0 - 32 IU/L _1 Lab Results  Component Value Date   WBC 10.0 05/20/2015   HGB 10.7 (L) 05/20/2015   HCT 32.0 (L) 05/20/2015   MCV 92.2 05/20/2015   PLT 179 05/20/2015    ASSESSMENT & PLAN:  Ductal carcinoma in situ (DCIS) of right breast 03/13/2020:Bilateral mastectomies Marlou Starks & Dillingham): Left breast: no evidence of malignancy Right breast: DCIS with necrosis and calcifications, two right axillary  lymph nodes negative for carcinoma.  ER 100%, PR 100%  Pathology counseling: I discussed the final pathology report of the patient provided  a copy of this report. I discussed the margins as well as lymph node surgeries. We also discussed the final staging along with previously performed ER/PR testing.  Since the patient had bilateral mastectomies there is no role of adjuvant therapy. I discontinued tamoxifen.  She was very surprised that we discontinue tamoxifen.  I informed her that since she had bilateral mastectomies and the pathology was DCIS she does not need any additional antiestrogen therapy.  Return to clinic in 3 months for survivorship care plan visit.  After that she can be seen on an as-needed basis. If she wishes to come see [place] annually then she can be seen in the long-term survivorship clinic. She will of course be watched and monitored by the surgeons.   No orders of the defined types were placed in this encounter.  The patient has a good understanding of the overall plan. she agrees with it. she will call with any problems that may develop before the next visit here.  Total time spent: 30 mins including face to face time and time spent for planning, charting and coordination of care  Nicholas Lose, MD 03/25/2020  I, Cloyde Reams Dorshimer, am acting as scribe for Dr. Nicholas Lose.  I have reviewed the above documentation for accuracy and completeness, and I agree with the above.

## 2020-03-25 ENCOUNTER — Other Ambulatory Visit: Payer: Self-pay

## 2020-03-25 ENCOUNTER — Telehealth: Payer: Self-pay | Admitting: Plastic Surgery

## 2020-03-25 ENCOUNTER — Encounter: Payer: Self-pay | Admitting: *Deleted

## 2020-03-25 ENCOUNTER — Inpatient Hospital Stay: Payer: BC Managed Care – PPO | Attending: Hematology and Oncology | Admitting: Hematology and Oncology

## 2020-03-25 DIAGNOSIS — Z9013 Acquired absence of bilateral breasts and nipples: Secondary | ICD-10-CM | POA: Diagnosis not present

## 2020-03-25 DIAGNOSIS — Z86000 Personal history of in-situ neoplasm of breast: Secondary | ICD-10-CM | POA: Diagnosis present

## 2020-03-25 DIAGNOSIS — D0511 Intraductal carcinoma in situ of right breast: Secondary | ICD-10-CM | POA: Diagnosis not present

## 2020-03-25 NOTE — Telephone Encounter (Signed)
Patient called to find out if it's normal that she feels movement inside when she moves her arms a certain way or bends over to pick something up. She also mentioned that on the drain on her right side, the white collar around the top (looks meshy) came off when she was putting guaze underneath like instructed. She just wants to make sure that this is normal. Please call her at [phone] to advise.

## 2020-03-25 NOTE — Assessment & Plan Note (Signed)
03/13/2020:Bilateral mastectomies Marlou Starks & Dillingham): Left breast: no evidence of malignancy Right breast: DCIS with necrosis and calcifications, two right axillary lymph nodes negative for carcinoma.  ER 100%, PR 100%  Pathology counseling: I discussed the final pathology report of the patient provided  a copy of this report. I discussed the margins as well as lymph node surgeries. We also discussed the final staging along with previously performed ER/PR testing.  Since the patient had bilateral mastectomies there is no role of adjuvant therapy. Return to clinic in 3 months for survivorship care plan visit.

## 2020-03-25 NOTE — Telephone Encounter (Signed)
Returned patients call. Verified with Venetia Night what the patient is feeling moving around is the expander that was placed underneath her muscle during surgery.  It is ok that the white piece fell off near the drain incision area and to place guaze underneath for comfort. Patient indicated her drain is working properly and draining. Patient understood and agreed. Reminded patient of PO follow up 03/29/20 at 11:00

## 2020-03-27 ENCOUNTER — Encounter: Payer: Self-pay | Admitting: Licensed Clinical Social Worker

## 2020-03-27 NOTE — Progress Notes (Signed)
[facility] CSW Progress Note  Clinical Education officer, museum received call back from patient to follow-up on coping post-surgery. Patient reports she is doing well physically. She is starting to do a couple of short walks each day. Mentally, doing fairly well, although had some difficult moments like seeing herself for the first time after surgery. Processing what she just experienced and thinking about new outlook on life after facing her mortality with diagnosis.   CSW reviewed support options of contact with CSW as needed, support group, and FYNN. Patient voiced understanding and will call if needs arise.    [name] , LCSW

## 2020-03-27 NOTE — Progress Notes (Signed)
Patient is a 44 year old female here for follow-up after undergoing bilateral mastectomies with Dr. Marlou Starks and immediate reconstruction with placement of tissue expanders and Flex HD with Dr. Marla Roe on 03/13/2020.  ~ 2 weeks PO Patient reports she is doing well.  Pain is well controlled.  Honeycomb dressings were removed today.  Incisions bilaterally are healing nicely, C/D/I.  No signs of infection, redness, drainage, seroma/hematoma.  Bilateral drains in place with serous fluid in bulbs.  Output has been ~30 to 40 cc/day for the last few days.  Will leave drains in place for a little longer.  Patient denies fever/chills, nausea/vomiting.  Reports her last fill went well but she did not tolerate the Valium very well.  She can try taking half of Valium and does not have to take it if she is not having muscle spasms.  We placed injectable saline in the Expander using a sterile technique: Right: 50 cc for a total of 350 / 535 cc Left: 50 cc for a total of 350 / 535 cc  Follow-up on Tuesday for drain removal.  Then follow-up in 2 weeks for additional fill.  Call office with any questions/concerns.  The [place] was signed into law in 2016 which includes the topic of electronic health records.  This provides immediate access to information in MyChart.  This includes consultation notes, operative notes, office notes, lab results and pathology reports.  If you have any questions about what you read please let us know at your next visit or call us at the office.  We are right here with you.

## 2020-03-29 ENCOUNTER — Encounter: Payer: Self-pay | Admitting: Plastic Surgery

## 2020-03-29 ENCOUNTER — Ambulatory Visit (INDEPENDENT_AMBULATORY_CARE_PROVIDER_SITE_OTHER): Payer: BC Managed Care – PPO | Admitting: Plastic Surgery

## 2020-03-29 ENCOUNTER — Other Ambulatory Visit: Payer: Self-pay

## 2020-03-29 VITALS — BP 124/84 | HR 67 | Temp 98.6°F

## 2020-03-29 DIAGNOSIS — Z9013 Acquired absence of bilateral breasts and nipples: Secondary | ICD-10-CM

## 2020-04-01 NOTE — Progress Notes (Signed)
Patient is a 44 year old female here for follow-up after undergoing bilateral mastectomies with Dr. Marlou Starks and immediate reconstruction with placement of tissue expanders and Flex HD with Dr. Marla Roe on 03/13/2020.  ~ 3 weeks PO Patient is accompanied by her husband today and reports she is doing very well.  Reports she tolerated her last fill well and only needed ibuprofen.  Reports she is like to continue with 50 cc fills as she would like to be smaller than she was prior to surgery.  Bilateral incisions are healing well, C/D/I.  No signs of infection, redness, drainage, seroma/hematoma.  Bilateral drains have had less than 30 cc for the last several days.  Drains removed today.  Denies fever/chills, nausea/vomiting.  Reports she spoke with Dr. Marlou Starks about physical therapy and he is okay with her beginning this.  We will send referral over for physical therapy.  We did not place injectable saline in the Expander using a sterile technique today since she just had a fill on Friday: Right: 0 cc for a total of 350 / 535 cc Left: 0 cc for a total of 350 / 535 cc  Cover bilateral drain sites with Vaseline and gauze/Band-Aid until closed.  May shower; continue avoiding direct spray until drain sites are closed.  Continue to wear compression 24/7 until 6 weeks postop.  Continue to sleep on your back.  Follow-up in 1-1/2 to 2 weeks for fill.  Call office with any questions/concerns.  The [place] was signed into law in 2016 which includes the topic of electronic health records.  This provides immediate access to information in MyChart.  This includes consultation notes, operative notes, office notes, lab results and pathology reports.  If you have any questions about what you read please let us know at your next visit or call us at the office.  We are right here with you.

## 2020-04-02 ENCOUNTER — Encounter: Payer: Self-pay | Admitting: Plastic Surgery

## 2020-04-02 ENCOUNTER — Other Ambulatory Visit: Payer: Self-pay

## 2020-04-02 ENCOUNTER — Ambulatory Visit (INDEPENDENT_AMBULATORY_CARE_PROVIDER_SITE_OTHER): Payer: BC Managed Care – PPO | Admitting: Plastic Surgery

## 2020-04-02 VITALS — BP 117/81 | HR 69 | Temp 98.4°F

## 2020-04-02 DIAGNOSIS — Z9013 Acquired absence of bilateral breasts and nipples: Secondary | ICD-10-CM

## 2020-04-03 ENCOUNTER — Ambulatory Visit: Payer: BC Managed Care – PPO | Attending: Plastic Surgery | Admitting: Physical Therapy

## 2020-04-03 DIAGNOSIS — M6281 Muscle weakness (generalized): Secondary | ICD-10-CM | POA: Diagnosis present

## 2020-04-03 DIAGNOSIS — R293 Abnormal posture: Secondary | ICD-10-CM

## 2020-04-03 DIAGNOSIS — Z483 Aftercare following surgery for neoplasm: Secondary | ICD-10-CM

## 2020-04-03 DIAGNOSIS — M25612 Stiffness of left shoulder, not elsewhere classified: Secondary | ICD-10-CM

## 2020-04-03 DIAGNOSIS — M25611 Stiffness of right shoulder, not elsewhere classified: Secondary | ICD-10-CM

## 2020-04-03 NOTE — Therapy (Signed)
[place], [place], [address] Phone: [phone]   Fax:  [phone]  Physical Therapy Evaluation  Patient Details  Name: Stacey Singh MRN: [id number] Date of Birth: 1975/09/21 Referring Provider (PT): Dr. Marla Roe    Encounter Date: 04/03/2020   PT End of Session - 04/03/20 1355    Visit Number 1    Number of Visits 9    Date for PT Re-Evaluation 06/04/20    PT Start Time 1300    PT Stop Time 1345    PT Time Calculation (min) 45 min    Activity Tolerance Patient limited by pain    Behavior During Therapy [facility] for tasks assessed/performed           Past Medical History:  Diagnosis Date  . Allergy   . Family history of breast cancer   . Family history of leukemia   . Heart murmur    in high school, resolved  . Heartburn in pregnancy   . Hypertension    with current pregnancy  . Hypertension   . No pertinent past medical history     Past Surgical History:  Procedure Laterality Date  . BREAST RECONSTRUCTION WITH PLACEMENT OF TISSUE EXPANDER AND FLEX HD (ACELLULAR HYDRATED DERMIS) Bilateral 03/13/2020   Procedure: BILATERAL BREAST RECONSTRUCTION WITH PLACEMENT OF TISSUE EXPANDERS AND FLEX HD (ACELLULAR HYDRATED DERMIS);  Surgeon: Wallace Going, DO;  Location: [facility];  Service: Plastics;  Laterality: Bilateral;  . CESAREAN SECTION  04/04/2012   Procedure: CESAREAN SECTION;  Surgeon: Farrel Gobble. Harrington Challenger, MD;  Location: [facility];  Service: Obstetrics;  Laterality: N/A;  . CESAREAN SECTION N/A 05/19/2015   Procedure: CESAREAN SECTION;  Surgeon: Allyn Kenner, DO;  Location: [facility];  Service: Obstetrics;  Laterality: N/A;  . CESAREAN SECTION    . DILATION AND CURETTAGE OF UTERUS    . MASTECTOMY W/ SENTINEL NODE BIOPSY Bilateral 03/13/2020   Procedure: BILATERAL MASTECTOMY WITH RIGHT SENTINEL LYMPH NODE BIOPSY;  Surgeon: Jovita Kussmaul, MD;  Location: [facility];   Service: General;  Laterality: Bilateral;  PEC BLOCK  . WISDOM TOOTH EXTRACTION      There were no vitals filed for this visit.    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  She has bilateral mastecomy on 03/13/2020 with immediate expanders Plans to have implants on June 13, 2020    Patient Stated Goals to return arms back to normal, to be able to wash her own hair.    Currently in Pain? No/denies              [facility] PT Assessment - 04/03/20 0001      Assessment   Medical Diagnosis right breast cancer     Referring Provider (PT) Dr. Marla Roe     Onset Date/Surgical Date 03/13/20    Hand Dominance Right      Precautions   Precautions Other (comment)   at risk for lymphedema      Restrictions   Weight Bearing Restrictions No      Balance Screen   Has the patient fallen in the past 6 months No    Has the patient had a decrease in activity level because of a fear of falling?  No    Is the patient reluctant to leave  their home because of a fear of falling?  No      Home Environment   Living Environment Private residence    Living Arrangements Spouse/significant other;Children   20 yo and 60 yo and 2 dogs    Available Help at Discharge Family;Friend(s);Available PRN/intermittently      Prior Function   Level of Independence Independent with basic ADLs   needs help washing hair    Vocation Full time employment    Vocation Requirements No matching choice found.    Leisure walks everyday       Cognition   Overall Cognitive Status Within Functional Limits for tasks assessed      Observation/Other Assessments   Observations Pt with healing incisions across both sides of chest with expanders in place. Dressings over drain sites . right shoulder appears to be forward and there is decreased scaplular movement with right arm elevation      Other Surveys  Quick Dash    Quick DASH  34.09      Sensation   Light Touch Appears Intact      Coordination   Gross Motor Movements are Fluid and Coordinated No   limted by pain, especially in right arm      Posture/Postural Control   Posture/Postural Control Postural limitations    Postural Limitations Rounded Shoulders;Forward head    Posture Comments right shoulder forward       ROM / Strength   AROM / PROM / Strength AROM;Strength      AROM   Right/Left Shoulder Right;Left    Right Shoulder Flexion 110 Degrees    Right Shoulder ABduction 90 Degrees    Right Shoulder External Rotation 90 Degrees    Left Shoulder Flexion 138 Degrees    Left Shoulder ABduction 110 Degrees    Left Shoulder External Rotation 95 Degrees      Strength   Overall Strength Deficits    Overall Strength Comments 2+/5 in right shoulder and 3-/5 in left shoulder limited by pain       Palpation   Palpation comment firmness in right axilla              LYMPHEDEMA/ONCOLOGY QUESTIONNAIRE - 04/03/20 0001      Type   Cancer Type right DCIS breast cacner       Surgeries   Mastectomy Date 03/13/20    Number Lymph Nodes Removed 2      Treatment   Active Chemotherapy Treatment No    Past Chemotherapy Treatment No    Active Radiation Treatment No    Past Radiation Treatment No    Current Hormone Treatment No    Past Hormone Therapy No      What other symptoms do you have   Are you Having Heaviness or Tightness Yes    Are you having Pain Yes    Are you having pitting edema No    Is it Hard or Difficult finding clothes that fit No    Do you have infections No    Is there Decreased scar mobility --   not tested    [place] Sign No      Lymphedema Assessments   Lymphedema Assessments Upper extremities      Right Upper Extremity Lymphedema   10 cm Proximal to Olecranon Process 36.5 cm    Olecranon Process 26.3 cm    15 cm Proximal to Ulnar Styloid Process 25 cm    Just Proximal to Ulnar  Styloid Process 15.5 cm  Across Hand at [employer] 18.6 cm    At [place] of 2nd Digit 6.1 cm      Left Upper Extremity Lymphedema   10 cm Proximal to Olecranon Process 36.6 cm    Olecranon Process 26.6 cm    15 cm Proximal to Ulnar Styloid Process 24 cm    Just Proximal to Ulnar Styloid Process 15.5 cm    Across Hand at [employer] 18.5 cm    At [place] of 2nd Digit 6.1 cm                 Quick Dash - 04/03/20 0001    Open a tight or new jar Moderate difficulty    Do heavy household chores (wash walls, wash floors) Mild difficulty    Carry a shopping bag or briefcase Moderate difficulty    Wash your back Mild difficulty    Use a knife to cut food No difficulty    Recreational activities in which you take some force or impact through your arm, shoulder, or hand (golf, hammering, tennis) Unable    During the past week, to what extent has your arm, shoulder or hand problem interfered with your normal social activities with family, friends, neighbors, or groups? Modererately    During the past week, to what extent has your arm, shoulder or hand problem limited your work or other regular daily activities Modererately    Arm, shoulder, or hand pain. Mild    Tingling (pins and needles) in your arm, shoulder, or hand None    Difficulty Sleeping No difficulty    DASH Score 34.09 %            Objective measurements completed on examination: See above findings.       [facility] Adult PT Treatment/Exercise - 04/03/20 0001      Self-Care   Self-Care Other Self-Care Comments    Other Self-Care Comments  information about ABC class, taught scap retraction exercise and encouraged backward shoulder rolls and neck ROM that pt is familiar with                   PT Education - 04/03/20 1358    Education Details ABC class and post op exercises    Person(s) Educated Patient    Methods Explanation;Demonstration;Handout    Comprehension Verbalized understanding             PT Short Term Goals - 04/03/20 1612      PT SHORT TERM GOAL #1   Title Pt will be familiar with lymphedema risk reduction practices    Time 4    Period Weeks    Status New             PT Long Term Goals - 04/03/20 1612      PT LONG TERM GOAL #1   Title Pt will have > 150 degrees of right and left shoulder abduction so that she can return to her normal activities    Time 8    Period Weeks    Status New      PT LONG TERM GOAL #2   Title Pt will be inpependent in a HEP  for shoulder ROM and strength    Time 8    Period Weeks    Status New                  Plan - 04/03/20 1358    Clinical Impression Statement Pt comes to PT 3 weeks  after surgery and after having drains pulled yesterday. She has limited shoulder ROM and strength with scapular dyskinesia and abnormal posture. She has not yet been able to sleep in her bed.  She was instructed to do scapular retractionm shoulder rolls and neck ROM and begin post op shoulder exercises in 2 more days.  She has equal circumferences of both arms. Anticipate she will progress nicely as she heals from surgery.    Personal Factors and Comorbidities Comorbidity 2    Comorbidities bilateral mastecomy, expanders with fills    Examination-Activity Limitations Caring for Others;Bend;Reach Overhead;Hygiene/Grooming    Examination-Participation Restrictions Meal Prep;Laundry    Stability/Clinical Decision Making Stable/Uncomplicated    Clinical Decision Making Low    Rehab Potential Good    PT Frequency 1x / week    PT Duration 8 weeks    PT Treatment/Interventions ADLs/Self Care Home Management;DME Instruction;Therapeutic activities;Therapeutic exercise;Neuromuscular re-education;Patient/family education;Manual techniques;Manual lymph drainage;Passive range of motion;Scar mobilization    PT Next Visit Plan did pt register for ABC class? reassess ROM, progress A/PROM , teach meeks decompression if pt able to lie flat, progress to  dowel exercies and to pulleys, supine scap. UE strength and finally Strength ABC.  Make sure pt has a sleeve prior to D/C    PT Home Exercise Plan scap retraction    Consulted and Agree with Plan of Care Patient           Patient will benefit from skilled therapeutic intervention in order to improve the following deficits and impairments:  Decreased skin integrity, Increased muscle spasms, Impaired perceived functional ability, Decreased range of motion, Decreased knowledge of precautions, Decreased activity tolerance, Decreased knowledge of use of DME, Decreased strength, Increased fascial restricitons, Impaired UE functional use, Postural dysfunction, Pain  Visit Diagnosis: Aftercare following surgery for neoplasm - Plan: PT plan of care cert/re-cert  Abnormal posture - Plan: PT plan of care cert/re-cert  Stiffness of right shoulder, not elsewhere classified - Plan: PT plan of care cert/re-cert  Stiffness of left shoulder, not elsewhere classified - Plan: PT plan of care cert/re-cert  Muscle weakness (generalized) - Plan: PT plan of care cert/re-cert     Problem List Patient Active Problem List   Diagnosis Date Noted  . Acquired absence of breast 03/22/2020  . Breast cancer ([place]) 03/13/2020  . Genetic testing 01/05/2020  . Family history of breast cancer   . Family history of leukemia   . 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 Obesity, Class III, BMI 40-49.9 (morbid obesity) ([place]) 12/29/2017  . Benign essential HTN 06/13/2015   Donato Heinz. Owens Shark PT  [place] 04/03/2020, 4:20 PM  [place], [place], [address] Phone: [phone]   Fax:  [phone]  Name: Stacey Singh MRN: [id number] Date of Birth: [birth year]

## 2020-04-03 NOTE — Patient Instructions (Signed)
Physical Therapy Information for After Breast Cancer Surgery/Treatment:   Lymphedema is a swelling condition that you may be at risk for in your arm if you have lymph nodes removed from the armpit area.  After a sentinel node biopsy, the risk is approximately 5-9% and is higher after an axillary node dissection.  There is treatment available for this condition and it is not life-threatening.  Contact your physician or physical therapist with concerns.  You may begin the 4 shoulder/posture exercises (see additional sheet) when permitted by your physician in 2 days  We encourage you to attend the free one time ABC (After Breast Cancer) class offered by [facility].  You will learn information related to lymphedema risk, prevention and treatment and additional exercises to regain mobility following surgery.  You can call [phone] for more information.  This is offered the 1st and 3rd Monday of each month.  You only attend the class one time.  While undergoing any medical procedure or treatment, try to avoid blood pressure being taken or needle sticks from occurring on the arm on the side of cancer.   This recommendation begins after surgery and continues for the rest of your life.  This may help reduce your risk of getting lymphedema (swelling in your arm).  An excellent resource for those seeking information on lymphedema is the National Lymphedema Network's web site. It can be accessed at [place].org  If you notice swelling in your hand, arm or breast at any time following surgery (even if it is many years from now), please contact your doctor or physical therapist to discuss this.  Lymphedema can be treated at any time but it is easier for you if it is treated early on.  If you feel like your shoulder motion is not returning to normal in a reasonable amount of time, please contact your surgeon or physical therapist.  After Breast Cancer Class  After Breast Cancer  Class is a specially designed exercise class to assist you in a safe recover after having breast cancer surgery.  In this class you will learn how to get back to full function whether your drains were just removed or if you had surgery a month ago.  This one-time class is held the 1st and 3rd Monday of every month from 11:00 a.m. until 12:00 noon at the [facility] located at [place], [place] [address]  This class is FREE and space is limited. For more information or to register for the next available class, call [phone].  Class Goals   Understand specific stretches to improve the flexibility of you chest and shoulder.  Learn ways to safely strengthen your upper body and improve your posture.  Understand the warning signs of infection and why you may be at risk for an arm infection.  Learn about Lymphedema and prevention.    Patient was instructed today in a home exercise program today for post op shoulder range of motion. These included active assist shoulder flexion in sitting, scapular retraction, wall walking with shoulder abduction, and hands behind head external rotation.  She was encouraged to do these twice a day, holding 3 seconds and repeating 5 times when permitted by her physician.

## 2020-04-05 ENCOUNTER — Encounter: Payer: Self-pay | Admitting: Plastic Surgery

## 2020-04-09 ENCOUNTER — Ambulatory Visit: Payer: BC Managed Care – PPO | Admitting: Physical Therapy

## 2020-04-09 ENCOUNTER — Encounter: Payer: Self-pay | Admitting: Physical Therapy

## 2020-04-09 ENCOUNTER — Other Ambulatory Visit: Payer: Self-pay

## 2020-04-09 DIAGNOSIS — M25611 Stiffness of right shoulder, not elsewhere classified: Secondary | ICD-10-CM

## 2020-04-09 DIAGNOSIS — R293 Abnormal posture: Secondary | ICD-10-CM

## 2020-04-09 DIAGNOSIS — M25612 Stiffness of left shoulder, not elsewhere classified: Secondary | ICD-10-CM

## 2020-04-09 DIAGNOSIS — Z483 Aftercare following surgery for neoplasm: Secondary | ICD-10-CM

## 2020-04-09 NOTE — Therapy (Signed)
[place], [place], [address] Phone: [phone]   Fax:  [phone]  Physical Therapy Treatment  Patient Details  Name: Stacey Singh MRN: [id number] Date of Birth: Dec 02, 1975 Referring Provider (PT): Dr. Marla Roe    Encounter Date: 04/09/2020   PT End of Session - 04/09/20 1206    Visit Number 2    Number of Visits 9    Date for PT Re-Evaluation 06/04/20    PT Start Time 1110    PT Stop Time 1200    PT Time Calculation (min) 50 min    Activity Tolerance Patient tolerated treatment well    Behavior During Therapy [facility] for tasks assessed/performed           Past Medical History:  Diagnosis Date  . Allergy   . Family history of breast cancer   . Family history of leukemia   . Heart murmur    in high school, resolved  . Heartburn in pregnancy   . Hypertension    with current pregnancy  . Hypertension   . No pertinent past medical history     Past Surgical History:  Procedure Laterality Date  . BREAST RECONSTRUCTION WITH PLACEMENT OF TISSUE EXPANDER AND FLEX HD (ACELLULAR HYDRATED DERMIS) Bilateral 03/13/2020   Procedure: BILATERAL BREAST RECONSTRUCTION WITH PLACEMENT OF TISSUE EXPANDERS AND FLEX HD (ACELLULAR HYDRATED DERMIS);  Surgeon: Wallace Going, DO;  Location: [place];  Service: Plastics;  Laterality: Bilateral;  . CESAREAN SECTION  04/04/2012   Procedure: CESAREAN SECTION;  Surgeon: Farrel Gobble. Harrington Challenger, MD;  Location: [place] [facility];  Service: Obstetrics;  Laterality: N/A;  . CESAREAN SECTION N/A 05/19/2015   Procedure: CESAREAN SECTION;  Surgeon: Allyn Kenner, DO;  Location: [facility];  Service: Obstetrics;  Laterality: N/A;  . CESAREAN SECTION    . DILATION AND CURETTAGE OF UTERUS    . MASTECTOMY W/ SENTINEL NODE BIOPSY Bilateral 03/13/2020   Procedure: BILATERAL MASTECTOMY WITH RIGHT SENTINEL LYMPH NODE BIOPSY;  Surgeon: Jovita Kussmaul, MD;  Location: [place];  Service: General;  Laterality: Bilateral;  PEC BLOCK  . WISDOM TOOTH EXTRACTION      There were no vitals filed for this visit.   Subjective Assessment - 04/09/20 1111    Subjective I feel like my arms are doing better than last week. I have been sitting up straight. I have been stretching my back and doing shoulder rolls.    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  She has bilateral mastecomy on 03/13/2020 with immediate expanders Plans to have implants on June 13, 2020    Patient Stated Goals to return arms back to normal, to be able to wash her own hair.    Currently in Pain? No/denies    Pain Score 0-No pain              [facility] PT Assessment - 04/09/20 0001      AROM   Right Shoulder Flexion 138 Degrees (P)     Right Shoulder ABduction 108 Degrees (P)     Left Shoulder Flexion 154 Degrees (P)     Left Shoulder ABduction 118 Degrees (P)                          [facility] Adult PT Treatment/Exercise - 04/09/20 0001      Shoulder Exercises: Supine   Flexion AAROM;Both;10 reps  with dowel with 5 sec holds, pt returned therapist demo   ABduction AAROM;Both;10 reps   with dowel with 5 sec holds, pt returned therapist demo     Manual Therapy   Manual Therapy Passive ROM;Soft tissue mobilization;Edema management    Edema Management created chip pack for pt to wear in her bra to help reduce right lateral trunk swelling    Soft tissue mobilization to R serratus anterior in area of tightness with several trigger points noted    Passive ROM to bilateral shoulders in direction of flexion, abduction and ER with prolonged holds                    PT Short Term Goals - 04/03/20 1612      PT SHORT TERM GOAL #1   Title Pt will be familiar with lymphedema risk reduction practices    Time 4    Period Weeks    Status New             PT Long Term Goals - 04/03/20 1612      PT LONG TERM GOAL #1    Title Pt will have > 150 degrees of right and left shoulder abduction so that she can return to her normal activities    Time 8    Period Weeks    Status New      PT LONG TERM GOAL #2   Title Pt will be inpependent in a HEP  for shoulder ROM and strength    Time 8    Period Weeks    Status New                 Plan - 04/09/20 1207    Clinical Impression Statement Remeasured ROM of bilateral shoulders today. It has improved since she was here last week. Instructed pt in AAROM exercises for bilateral shoulders using dowel. Began PROM to bilateral shoulders and pt was able to acheive full PROM by end of session. She has some swelling in right lateral trunk so created a chip pack for this. Soft tissue mobilization performed to serratus in area of tightness and discomfort.    PT Frequency 1x / week    PT Duration 8 weeks    PT Treatment/Interventions ADLs/Self Care Home Management;DME Instruction;Therapeutic activities;Therapeutic exercise;Neuromuscular re-education;Patient/family education;Manual techniques;Manual lymph drainage;Passive range of motion;Scar mobilization    PT Next Visit Plan reassess ROM, progress A/PROM , teach meeks decompression if pt able to lie flat, assess indep with dowel exercies and to pulleys, supine scap. UE strength and finally Strength ABC.  Make sure pt has a sleeve prior to D/C    PT Home Exercise Plan scap retraction, supine dowel    Consulted and Agree with Plan of Care Patient           Patient will benefit from skilled therapeutic intervention in order to improve the following deficits and impairments:  Decreased skin integrity, Increased muscle spasms, Impaired perceived functional ability, Decreased range of motion, Decreased knowledge of precautions, Decreased activity tolerance, Decreased knowledge of use of DME, Decreased strength, Increased fascial restricitons, Impaired UE functional use, Postural dysfunction, Pain  Visit Diagnosis: Aftercare  following surgery for neoplasm  Abnormal posture  Stiffness of right shoulder, not elsewhere classified  Stiffness of left shoulder, not elsewhere classified     Problem List Patient Active Problem List   Diagnosis Date Noted  . Acquired absence of breast 03/22/2020  . Breast cancer ([place]) 03/13/2020  . Genetic testing  01/05/2020  . Family history of breast cancer   . Family history of leukemia   . 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 Obesity, Class III, BMI 40-49.9 (morbid obesity) ([place]) 12/29/2017  . Benign essential HTN 06/13/2015    Allyson Sabal [facility] 04/09/2020, 12:09 PM  [place], [place], [address] Phone: [phone]   Fax:  [phone]  Name: Stacey Singh MRN: [id number] Date of Birth: 1976/06/12  Manus Gunning, PT 04/09/20 12:09 PM

## 2020-04-09 NOTE — Patient Instructions (Signed)
Shoulder: Flexion (Supine)    With hands shoulder width apart, slowly lower dowel to floor behind head. Do not let elbows bend. Keep back flat. Hold _5-10___ seconds. Repeat __10__ times. Do _2-3___ sessions per day. CAUTION: Stretch slowly and gently.  Copyright  VHI. All rights reserved.  Shoulder: Abduction (Supine)    With right arm flat on floor, hold dowel in palm. Slowly move arm up to side of head by pushing with opposite arm. Do not let elbow bend. Hold 5-10____ seconds. Repeat 10____ times. Do _2-3___ sessions per day. CAUTION: Stretch slowly and gently. Repeat on opposite side.   Copyright  VHI. All rights reserved.

## 2020-04-12 NOTE — Progress Notes (Signed)
Patient is a 44 year old female here for follow-up after bilateral mastectomies with Dr. Marlou Starks followed by immediate breast reconstruction w/ placement of tissue expanders and Flex HD with Dr. Marla Roe on 03/13/2020.  At her last appointment on 04/02/2020 patient was referred to physical therapy for assistance with improving range of motion. She reports PT is going great, she feels more able and has noticed an improvement in her ROM. She has noticed a rash that developed on her right inner upper arm and her bilateral breasts. She does not recall any specific changes.  Chaperone present on exam Bilateral breast incisions in-tact, no fluid wave or swelling noted. Mastectomy flaps are viable. Maculopapular rash noted on bilateral breasts around incision, within the central portion of her lower sternum and on her upper inner R arm. No cellulitis. No induration. No other areas of erythema noted.    We placed injectable saline in the Expander using a sterile technique: Right: 50 cc for a total of 400/535 cc Left: 50 cc for a total of 400/535 cc  Patient is doing well, no sign of infection, seroma, hematoma.  Patient tolerated fill. Recommend follow up in 2 weeks for additional fill. Rx for steroid cream sent to pharmacy, unknown etiology of rash Tentatively scheduled for exchange 06/13/20.

## 2020-04-15 ENCOUNTER — Other Ambulatory Visit: Payer: Self-pay

## 2020-04-15 ENCOUNTER — Encounter: Payer: Self-pay | Admitting: Physical Therapy

## 2020-04-15 ENCOUNTER — Ambulatory Visit: Payer: BC Managed Care – PPO | Admitting: Physical Therapy

## 2020-04-15 DIAGNOSIS — M25611 Stiffness of right shoulder, not elsewhere classified: Secondary | ICD-10-CM

## 2020-04-15 DIAGNOSIS — Z483 Aftercare following surgery for neoplasm: Secondary | ICD-10-CM | POA: Diagnosis not present

## 2020-04-15 DIAGNOSIS — M25612 Stiffness of left shoulder, not elsewhere classified: Secondary | ICD-10-CM

## 2020-04-15 NOTE — Therapy (Signed)
[place], [place], [address] Phone: [phone]   Fax:  [phone]  Physical Therapy Treatment  Patient Details  Name: Stacey Singh MRN: [id number] Date of Birth: [birth year] Referring Provider (PT): Dr. Marla Roe    Encounter Date: 04/15/2020   PT End of Session - 04/15/20 0956    Visit Number 3    Number of Visits 9    Date for PT Re-Evaluation 06/04/20    PT Start Time 0905    PT Stop Time 0956    PT Time Calculation (min) 51 min    Activity Tolerance Patient tolerated treatment well    Behavior During Therapy [facility] for tasks assessed/performed           Past Medical History:  Diagnosis Date  . Allergy   . Family history of breast cancer   . Family history of leukemia   . Heart murmur    in high school, resolved  . Heartburn in pregnancy   . Hypertension    with current pregnancy  . Hypertension   . No pertinent past medical history     Past Surgical History:  Procedure Laterality Date  . BREAST RECONSTRUCTION WITH PLACEMENT OF TISSUE EXPANDER AND FLEX HD (ACELLULAR HYDRATED DERMIS) Bilateral 03/13/2020   Procedure: BILATERAL BREAST RECONSTRUCTION WITH PLACEMENT OF TISSUE EXPANDERS AND FLEX HD (ACELLULAR HYDRATED DERMIS);  Surgeon: Wallace Going, DO;  Location: [place];  Service: Plastics;  Laterality: Bilateral;  . CESAREAN SECTION  04/04/2012   Procedure: CESAREAN SECTION;  Surgeon: Farrel Gobble. Harrington Challenger, MD;  Location: [facility];  Service: Obstetrics;  Laterality: N/A;  . CESAREAN SECTION N/A 05/19/2015   Procedure: CESAREAN SECTION;  Surgeon: Allyn Kenner, DO;  Location: [facility];  Service: Obstetrics;  Laterality: N/A;  . CESAREAN SECTION    . DILATION AND CURETTAGE OF UTERUS    . MASTECTOMY W/ SENTINEL NODE BIOPSY Bilateral 03/13/2020   Procedure: BILATERAL MASTECTOMY WITH RIGHT SENTINEL LYMPH NODE BIOPSY;  Surgeon: Jovita Kussmaul, MD;  Location: [place];  Service: General;  Laterality: Bilateral;  PEC BLOCK  . WISDOM TOOTH EXTRACTION      There were no vitals filed for this visit.   Subjective Assessment - 04/15/20 0906    Subjective The foam chip pack helped so I used a balled up sock on the other side and it helped.    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  She has bilateral mastecomy on 03/13/2020 with immediate expanders Plans to have implants on June 13, 2020    Patient Stated Goals to return arms back to normal, to be able to wash her own hair.    Currently in Pain? No/denies    Pain Score 0-No pain              [facility] PT Assessment - 04/15/20 0001      AROM   Right Shoulder Flexion 165 Degrees    Right Shoulder ABduction 158 Degrees    Left Shoulder Flexion 164 Degrees    Left Shoulder ABduction 168 Degrees                         [facility] Adult PT Treatment/Exercise - 04/15/20 0001      Shoulder Exercises: Pulleys   Flexion 2 minutes   with stretch at end range   ABduction 2 minutes   with stretch  at end range     Shoulder Exercises: Therapy Ball   Flexion 10 reps;Both   pt returnd therapist demo, stretch at end range   ABduction 10 reps;Both   pt returned therapist demo, stretch at end range     Manual Therapy   Soft tissue mobilization to R and L serratus anterior in area of tightness with improvements in trigger points noted compared to last session    Passive ROM to bilateral shoulders in direction of flexion, abduction and ER with prolonged holds                    PT Short Term Goals - 04/03/20 1612      PT SHORT TERM GOAL #1   Title Pt will be familiar with lymphedema risk reduction practices    Time 4    Period Weeks    Status New             PT Long Term Goals - 04/03/20 1612      PT LONG TERM GOAL #1   Title Pt will have > 150 degrees of right and left shoulder abduction so that she can return to her normal  activities    Time 8    Period Weeks    Status New      PT LONG TERM GOAL #2   Title Pt will be inpependent in a HEP  for shoulder ROM and strength    Time 8    Period Weeks    Status New                 Plan - 04/15/20 0957    Clinical Impression Statement Began AAROM exercises today to continue to improve end range of motion. Remeasured pt's ROM and it has improved greatly since last session. Continued with PROM and manual therapy to bilateral serratus in areas of tightness. Pt will be ready for strengthening exercises at next session.    PT Frequency 1x / week    PT Duration 8 weeks    PT Treatment/Interventions ADLs/Self Care Home Management;DME Instruction;Therapeutic activities;Therapeutic exercise;Neuromuscular re-education;Patient/family education;Manual techniques;Manual lymph drainage;Passive range of motion;Scar mobilization    PT Next Visit Plan instruct in supine scap, cont with PROM, instruct in Strength ABC eventually.  Make sure pt has a sleeve prior to D/C    PT Home Exercise Plan scap retraction, supine dowel    Consulted and Agree with Plan of Care Patient           Patient will benefit from skilled therapeutic intervention in order to improve the following deficits and impairments:  Decreased skin integrity, Increased muscle spasms, Impaired perceived functional ability, Decreased range of motion, Decreased knowledge of precautions, Decreased activity tolerance, Decreased knowledge of use of DME, Decreased strength, Increased fascial restricitons, Impaired UE functional use, Postural dysfunction, Pain  Visit Diagnosis: Aftercare following surgery for neoplasm  Stiffness of right shoulder, not elsewhere classified  Stiffness of left shoulder, not elsewhere classified     Problem List Patient Active Problem List   Diagnosis Date Noted  . Acquired absence of breast 03/22/2020  . Breast cancer ([place]) 03/13/2020  . Genetic testing 01/05/2020  . Family  history of breast cancer   . Family history of leukemia   . 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 Obesity, Class III, BMI 40-49.9 (morbid obesity) ([place]) 12/29/2017  . Benign essential HTN 06/13/2015    Allyson Sabal [facility] 04/15/2020, 10:00 AM  [facility]  [place], [place], [address] Phone: [phone]   Fax:  [phone]  Name: Stacey Singh MRN: [id number] Date of Birth: 01-04-1976  Manus Gunning, PT 04/15/20 10:00 AM

## 2020-04-16 ENCOUNTER — Encounter: Payer: Self-pay | Admitting: Surgical

## 2020-04-16 ENCOUNTER — Ambulatory Visit (INDEPENDENT_AMBULATORY_CARE_PROVIDER_SITE_OTHER): Payer: BC Managed Care – PPO | Admitting: Surgical

## 2020-04-16 VITALS — BP 114/79 | HR 73 | Temp 98.3°F

## 2020-04-16 DIAGNOSIS — D0511 Intraductal carcinoma in situ of right breast: Secondary | ICD-10-CM

## 2020-04-16 DIAGNOSIS — Z9013 Acquired absence of bilateral breasts and nipples: Secondary | ICD-10-CM

## 2020-04-16 DIAGNOSIS — R21 Rash and other nonspecific skin eruption: Secondary | ICD-10-CM

## 2020-04-16 MED ORDER — TRIAMCINOLONE ACETONIDE 0.025 % EX OINT: 1.0000 "application " | TOPICAL_OINTMENT | Freq: Two times a day (BID) | CUTANEOUS | 0 refills | Status: DC

## 2020-04-23 ENCOUNTER — Ambulatory Visit: Payer: BC Managed Care – PPO | Admitting: Physical Therapy

## 2020-04-23 ENCOUNTER — Other Ambulatory Visit: Payer: Self-pay

## 2020-04-23 ENCOUNTER — Encounter: Payer: Self-pay | Admitting: Physical Therapy

## 2020-04-23 DIAGNOSIS — Z483 Aftercare following surgery for neoplasm: Secondary | ICD-10-CM | POA: Diagnosis not present

## 2020-04-23 DIAGNOSIS — M25611 Stiffness of right shoulder, not elsewhere classified: Secondary | ICD-10-CM

## 2020-04-23 DIAGNOSIS — R293 Abnormal posture: Secondary | ICD-10-CM

## 2020-04-23 NOTE — Therapy (Signed)
[place], [place], [address] Phone: [phone]   Fax:  [phone]  Physical Therapy Treatment  Patient Details  Name: Stacey Singh MRN: [id number] Date of Birth: 09/16/1975 Referring Provider (PT): Dr. Marla Roe    Encounter Date: 04/23/2020   PT End of Session - 04/23/20 1205    Visit Number 4    Number of Visits 9    Date for PT Re-Evaluation 06/04/20    PT Start Time 1203    PT Stop Time 1257    PT Time Calculation (min) 54 min    Activity Tolerance Patient tolerated treatment well    Behavior During Therapy [facility] for tasks assessed/performed           Past Medical History:  Diagnosis Date  . Allergy   . Family history of breast cancer   . Family history of leukemia   . Heart murmur    in high school, resolved  . Heartburn in pregnancy   . Hypertension    with current pregnancy  . Hypertension   . No pertinent past medical history     Past Surgical History:  Procedure Laterality Date  . BREAST RECONSTRUCTION WITH PLACEMENT OF TISSUE EXPANDER AND FLEX HD (ACELLULAR HYDRATED DERMIS) Bilateral 03/13/2020   Procedure: BILATERAL BREAST RECONSTRUCTION WITH PLACEMENT OF TISSUE EXPANDERS AND FLEX HD (ACELLULAR HYDRATED DERMIS);  Surgeon: Wallace Going, DO;  Location: [place];  Service: Plastics;  Laterality: Bilateral;  . CESAREAN SECTION  04/04/2012   Procedure: CESAREAN SECTION;  Surgeon: Farrel Gobble. Harrington Challenger, MD;  Location: [facility];  Service: Obstetrics;  Laterality: N/A;  . CESAREAN SECTION N/A 05/19/2015   Procedure: CESAREAN SECTION;  Surgeon: Allyn Kenner, DO;  Location: [facility];  Service: Obstetrics;  Laterality: N/A;  . CESAREAN SECTION    . DILATION AND CURETTAGE OF UTERUS    . MASTECTOMY W/ SENTINEL NODE BIOPSY Bilateral 03/13/2020   Procedure: BILATERAL MASTECTOMY WITH RIGHT SENTINEL LYMPH NODE BIOPSY;  Surgeon: Jovita Kussmaul, MD;  Location: [place];  Service: General;  Laterality: Bilateral;  PEC BLOCK  . WISDOM TOOTH EXTRACTION      There were no vitals filed for this visit.   Subjective Assessment - 04/23/20 1204    Subjective I had a fill up since I saw you last so I am a lot more tight. My arms are good but my pec muscle feels very tight.    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  She has bilateral mastecomy on 03/13/2020 with immediate expanders Plans to have implants on June 13, 2020    Patient Stated Goals to return arms back to normal, to be able to wash her own hair.    Currently in Pain? No/denies    Pain Score 0-No pain                             [facility] Adult PT Treatment/Exercise - 04/23/20 0001      Shoulder Exercises: Supine   Horizontal ABduction Strengthening;Both;10 reps;Theraband   pt returned therapist   Theraband Level (Shoulder Horizontal ABduction) Level 2 (Red)    External Rotation Strengthening;Both;10 reps;Theraband   pt returned therapist demo   Theraband Level (Shoulder External Rotation) Level 2 (Red)    Flexion Strengthening;Both;10 reps;Theraband   narrow and wide grip, pt returned therapist demo   Theraband Level (  Shoulder Flexion) Level 2 (Red)    Diagonals Strengthening;Both;10 reps;Theraband   pt returned therapist demo   Theraband Level (Shoulder Diagonals) Level 2 (Red)      Manual Therapy   Soft tissue mobilization to R serratus anterior in area of tightness - no trigger points noted, also to bilateral pec muscles avoiding implants - pt had increased tightness after recent fill    Passive ROM to bilateral shoulders in direction of flexion, abduction and ER with prolonged holds                  PT Education - 04/23/20 1303    Education Details not to warm up the goalie to reduce pec tightness    Person(s) Educated Patient    Methods Explanation    Comprehension Verbalized understanding            PT  Short Term Goals - 04/03/20 1612      PT SHORT TERM GOAL #1   Title Pt will be familiar with lymphedema risk reduction practices    Time 4    Period Weeks    Status New             PT Long Term Goals - 04/03/20 1612      PT LONG TERM GOAL #1   Title Pt will have > 150 degrees of right and left shoulder abduction so that she can return to her normal activities    Time 8    Period Weeks    Status New      PT LONG TERM GOAL #2   Title Pt will be inpependent in a HEP  for shoulder ROM and strength    Time 8    Period Weeks    Status New                 Plan - 04/23/20 1301    Clinical Impression Statement Pt has recent fill of expanders and has been having increased pec tightness since that time. Her ROM is still WFL. She feels tightness when sitting up. Worked on soft tissue mobilization across bilateral pecs avoiding implants to help reduce tightness. Instructed pt in supine scapular strengthening exercises today and issued these as part of an HEP.    PT Frequency 1x / week    PT Duration 8 weeks    PT Treatment/Interventions ADLs/Self Care Home Management;DME Instruction;Therapeutic activities;Therapeutic exercise;Neuromuscular re-education;Patient/family education;Manual techniques;Manual lymph drainage;Passive range of motion;Scar mobilization    PT Next Visit Plan assess indep with supine scap, cont with PROM, instruct in Strength ABC eventually.  Make sure pt has a sleeve prior to D/C    PT Home Exercise Plan scap retraction, supine dowel, supine scap    Consulted and Agree with Plan of Care Patient           Patient will benefit from skilled therapeutic intervention in order to improve the following deficits and impairments:  Decreased skin integrity, Increased muscle spasms, Impaired perceived functional ability, Decreased range of motion, Decreased knowledge of precautions, Decreased activity tolerance, Decreased knowledge of use of DME, Decreased strength,  Increased fascial restricitons, Impaired UE functional use, Postural dysfunction, Pain  Visit Diagnosis: Stiffness of right shoulder, not elsewhere classified  Aftercare following surgery for neoplasm  Abnormal posture     Problem List Patient Active Problem List   Diagnosis Date Noted  . Acquired absence of breast 03/22/2020  . Breast cancer ([place]) 03/13/2020  . Genetic testing 01/05/2020  . Family history of  breast cancer   . Family history of leukemia   . 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 Obesity, Class III, BMI 40-49.9 (morbid obesity) ([place]) 12/29/2017  . Benign essential HTN 06/13/2015    Allyson Sabal [facility] 04/23/2020, 1:04 PM  [place], [place], [address] Phone: [phone]   Fax:  [phone]  Name: Stacey Singh MRN: [id number] Date of Birth: April 12, 1976  Manus Gunning, PT 04/23/20 1:04 PM

## 2020-04-23 NOTE — Patient Instructions (Signed)
Over Head Pull: Narrow and Wide Grip   Cancer Rehab [phone]   On back, knees bent, feet flat, band across thighs, elbows straight but relaxed. Pull hands apart (start). Keeping elbows straight, bring arms up and over head, hands toward floor. Keep pull steady on band. Hold momentarily. Return slowly, keeping pull steady, back to start. Then do same with a wider grip on the band (past shoulder width) Repeat _10__ times. Band color __red___   Side Pull: Double Arm   On back, knees bent, feet flat. Arms perpendicular to body, shoulder level, elbows straight but relaxed. Pull arms out to sides, elbows straight. Resistance band comes across collarbones, hands toward floor. Hold momentarily. Slowly return to starting position. Repeat _10__ times. Band color _red____   Sword   On back, knees bent, feet flat, left hand on left hip, right hand above left. Pull right arm DIAGONALLY (hip to shoulder) across chest. Bring right arm along head toward floor. Hold momentarily. Slowly return to starting position. Thumb down by hip and rotates upwards when by head. Repeat on opposite side. Repeat _10__ times. Do with left arm. Band color _red_____   Shoulder Rotation: Double Arm   On back, knees bent, feet flat, elbows tucked at sides, bent 90, hands palms up. Pull hands apart and down toward floor, keeping elbows near sides. Hold momentarily. Slowly return to starting position. Repeat _10__ times. Band color __red___

## 2020-04-28 NOTE — Progress Notes (Signed)
Patient is a 44 year old female here for follow-up after undergoing bilateral mastectomies with Dr. Marlou Starks and immediate reconstruction with placement of tissue expanders and Flex HD with Dr. Marla Roe on 03/13/2020.  Patient was referred to physical therapy on 04/02/2020 for assistance in improving range of motion.  At last visit on 9/21 patient was noted to have a maculopapular rash on bilateral breasts around the incision, within the central portion of her lower sternum, and on the upper inner right arm.  She was given steroid cream for this.  ~ 7 weeks PO Patient reports overall she is doing well. Reports some swelling of right pec muscle. Bilateral incisions are healing nicely, c/d/i. No signs of infection, redness, drainage. Prior rash has resolved. Diffuse swelling of pectoral muscle on the right side is noted. Denies F/C, N/D.  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 Suspect this as cause of increased swelling of right pectoral muscle.  Reports she tolerated her last fill without issue and would like an additional fill today.  We placed injectable saline in the Expander using a sterile technique: Right: 50 cc for a total of 450 / 535 cc Left: 50 cc for a total of 450 / 535 cc  Recommend refraining from or at least limiting the lacrosse shots. She may use ibuprofen and ice to assist with swelling. Continue to wear compression garments during the day. May resume normal activities gradually as tolerated. May shower normally. Follow up in 2 weeks for additional fill. Call office with any questions/concerns.  Surgery for exchange of tissue expanders to implants is scheduled for 06/13/2020.  The [place] was signed into law in 2016 which includes the topic of electronic health records.  This provides immediate access to information in MyChart.  This includes consultation notes, operative notes, office notes, lab  results and pathology reports.  If you have any questions about what you read please let us know at your next visit or call us at the office.  We are right here with you.

## 2020-04-29 ENCOUNTER — Encounter: Payer: BC Managed Care – PPO | Admitting: Physical Therapy

## 2020-05-01 ENCOUNTER — Ambulatory Visit (INDEPENDENT_AMBULATORY_CARE_PROVIDER_SITE_OTHER): Payer: BC Managed Care – PPO | Admitting: Plastic Surgery

## 2020-05-01 ENCOUNTER — Encounter: Payer: Self-pay | Admitting: Plastic Surgery

## 2020-05-01 ENCOUNTER — Other Ambulatory Visit: Payer: Self-pay

## 2020-05-01 VITALS — BP 133/90 | HR 80 | Temp 98.0°F

## 2020-05-01 DIAGNOSIS — Z9013 Acquired absence of bilateral breasts and nipples: Secondary | ICD-10-CM

## 2020-05-02 ENCOUNTER — Ambulatory Visit: Payer: BC Managed Care – PPO | Attending: Plastic Surgery | Admitting: Rehabilitation

## 2020-05-02 ENCOUNTER — Encounter: Payer: Self-pay | Admitting: Rehabilitation

## 2020-05-02 DIAGNOSIS — R293 Abnormal posture: Secondary | ICD-10-CM | POA: Insufficient documentation

## 2020-05-02 DIAGNOSIS — Z483 Aftercare following surgery for neoplasm: Secondary | ICD-10-CM | POA: Insufficient documentation

## 2020-05-02 DIAGNOSIS — M25612 Stiffness of left shoulder, not elsewhere classified: Secondary | ICD-10-CM | POA: Diagnosis present

## 2020-05-02 DIAGNOSIS — M25611 Stiffness of right shoulder, not elsewhere classified: Secondary | ICD-10-CM | POA: Insufficient documentation

## 2020-05-02 DIAGNOSIS — M6281 Muscle weakness (generalized): Secondary | ICD-10-CM | POA: Diagnosis present

## 2020-05-02 NOTE — Therapy (Signed)
[place], [place], [address] Phone: [phone]   Fax:  [phone]  Physical Therapy Treatment  Patient Details  Name: Stacey Singh MRN: [id number] Date of Birth: Sep 20, 1975 Referring Provider (PT): Dr. Marla Roe    Encounter Date: 05/02/2020   PT End of Session - 05/02/20 1000    Visit Number 5    Number of Visits 9    Date for PT Re-Evaluation 06/04/20    PT Start Time 0900    PT Stop Time 1000    PT Time Calculation (min) 60 min    Activity Tolerance Patient tolerated treatment well    Behavior During Therapy [facility] for tasks assessed/performed           Past Medical History:  Diagnosis Date   Allergy    Family history of breast cancer    Family history of leukemia    Heart murmur    in high school, resolved   Heartburn in pregnancy    Hypertension    with current pregnancy   Hypertension    No pertinent past medical history     Past Surgical History:  Procedure Laterality Date   BREAST RECONSTRUCTION WITH PLACEMENT OF TISSUE EXPANDER AND FLEX HD (ACELLULAR HYDRATED DERMIS) Bilateral 03/13/2020   Procedure: BILATERAL BREAST RECONSTRUCTION WITH PLACEMENT OF TISSUE EXPANDERS AND FLEX HD (ACELLULAR HYDRATED DERMIS);  Surgeon: Wallace Going, DO;  Location: [place];  Service: Plastics;  Laterality: Bilateral;   CESAREAN SECTION  04/04/2012   Procedure: CESAREAN SECTION;  Surgeon: Farrel Gobble. Harrington Challenger, MD;  Location: [facility];  Service: Obstetrics;  Laterality: N/A;   CESAREAN SECTION N/A 05/19/2015   Procedure: CESAREAN SECTION;  Surgeon: Allyn Kenner, DO;  Location: [facility];  Service: Obstetrics;  Laterality: N/A;   CESAREAN SECTION     DILATION AND CURETTAGE OF UTERUS     MASTECTOMY W/ SENTINEL NODE BIOPSY Bilateral 03/13/2020   Procedure: BILATERAL MASTECTOMY WITH RIGHT SENTINEL LYMPH NODE BIOPSY;  Surgeon: Jovita Kussmaul, MD;  Location: [facility];  Service: General;  Laterality: Bilateral;  PEC BLOCK   WISDOM TOOTH EXTRACTION      There were no vitals filed for this visit.   Subjective Assessment - 05/02/20 0906    Subjective My ROM is way better.  I still have my tightness with fill ups at least 1 more on 10/19.  Both sides tight in the chest and some swelling on the Rt.    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  She has bilateral mastecomy on 03/13/2020 with immediate expanders Plans to have implants on June 13, 2020    Currently in Pain? No/denies                             [facility] Adult PT Treatment/Exercise - 05/02/20 0001      Manual Therapy   Manual Therapy Manual Lymphatic Drainage (MLD)    Manual therapy comments focus on Rt chest and breast post fill: bil axillary nodes, Rt inguinal nodes, short neck, then working Rt anterior chest and lateral breast towards pathways and reversing all steps    Soft tissue mobilization to bil serratus and latissimus mostly in sidelying    Passive ROM to bilateral shoulders in direction of flexion, abduction and ER with prolonged holds  PT Short Term Goals - 04/03/20 1612      PT SHORT TERM GOAL #1   Title Pt will be familiar with lymphedema risk reduction practices    Time 4    Period Weeks    Status New             PT Long Term Goals - 04/03/20 1612      PT LONG TERM GOAL #1   Title Pt will have > 150 degrees of right and left shoulder abduction so that she can return to her normal activities    Time 8    Period Weeks    Status New      PT LONG TERM GOAL #2   Title Pt will be inpependent in a HEP  for shoulder ROM and strength    Time 8    Period Weeks    Status New                 Plan - 05/02/20 1001    Clinical Impression Statement Pt with fill yesterday resulting in more chest tightness and puffiness in the Rt chest and breast.  Pt is having less pain  than last time though as Dr. Marla Roe injected the fill more slowly.  Pt revealed she has been doing alot of throwing and field hockey/lacrosse at practice and relaized this may be what is keeping her sore onthe Rt.  Decreased anterior chest puffiness after MLD today and pt will schedule out more 1x per week for around 4 weeks.    PT Frequency 1x / week    PT Duration 8 weeks    PT Treatment/Interventions ADLs/Self Care Home Management;DME Instruction;Therapeutic activities;Therapeutic exercise;Neuromuscular re-education;Patient/family education;Manual techniques;Manual lymph drainage;Passive range of motion;Scar mobilization    PT Next Visit Plan cont MLD right chest around expander as needed, PROM, STM to decrease soreness post fills      assess indep with supine scap, cont with PROM, instruct in Strength ABC eventually.  Make sure pt has a sleeve prior to D/C    PT Home Exercise Plan scap retraction, supine dowel, supine scap    Consulted and Agree with Plan of Care Patient           Patient will benefit from skilled therapeutic intervention in order to improve the following deficits and impairments:     Visit Diagnosis: Stiffness of right shoulder, not elsewhere classified  Aftercare following surgery for neoplasm  Abnormal posture  Stiffness of left shoulder, not elsewhere classified  Muscle weakness (generalized)     Problem List Patient Active Problem List   Diagnosis Date Noted   Acquired absence of breast 03/22/2020   Breast cancer ([place]) 03/13/2020   Genetic testing 01/05/2020   Family history of breast cancer    Family history of leukemia    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

## 2020-05-08 ENCOUNTER — Other Ambulatory Visit: Payer: Self-pay

## 2020-05-08 ENCOUNTER — Encounter: Payer: Self-pay | Admitting: Physical Therapy

## 2020-05-08 ENCOUNTER — Ambulatory Visit: Payer: BC Managed Care – PPO | Admitting: Physical Therapy

## 2020-05-08 DIAGNOSIS — Z483 Aftercare following surgery for neoplasm: Secondary | ICD-10-CM

## 2020-05-08 DIAGNOSIS — M25611 Stiffness of right shoulder, not elsewhere classified: Secondary | ICD-10-CM

## 2020-05-08 DIAGNOSIS — M25612 Stiffness of left shoulder, not elsewhere classified: Secondary | ICD-10-CM

## 2020-05-08 NOTE — Therapy (Signed)
[place], [place], [address] Phone: [phone]   Fax:  [phone]  Physical Therapy Treatment  Patient Details  Name: Stacey Singh MRN: [id number] Date of Birth: 1975-10-26 Referring Provider (PT): Dr. Marla Roe    Encounter Date: 05/08/2020   PT End of Session - 05/08/20 1018    Visit Number 6    Number of Visits 9    Date for PT Re-Evaluation 06/04/20    PT Start Time 0911    PT Stop Time 1008    PT Time Calculation (min) 57 min    Activity Tolerance Patient tolerated treatment well    Behavior During Therapy [facility] for tasks assessed/performed           Past Medical History:  Diagnosis Date  . Allergy   . Family history of breast cancer   . Family history of leukemia   . Heart murmur    in high school, resolved  . Heartburn in pregnancy   . Hypertension    with current pregnancy  . Hypertension   . No pertinent past medical history     Past Surgical History:  Procedure Laterality Date  . BREAST RECONSTRUCTION WITH PLACEMENT OF TISSUE EXPANDER AND FLEX HD (ACELLULAR HYDRATED DERMIS) Bilateral 03/13/2020   Procedure: BILATERAL BREAST RECONSTRUCTION WITH PLACEMENT OF TISSUE EXPANDERS AND FLEX HD (ACELLULAR HYDRATED DERMIS);  Surgeon: Wallace Going, DO;  Location: [place];  Service: Plastics;  Laterality: Bilateral;  . CESAREAN SECTION  04/04/2012   Procedure: CESAREAN SECTION;  Surgeon: Farrel Gobble. Harrington Challenger, MD;  Location: [facility];  Service: Obstetrics;  Laterality: N/A;  . CESAREAN SECTION N/A 05/19/2015   Procedure: CESAREAN SECTION;  Surgeon: Allyn Kenner, DO;  Location: [facility];  Service: Obstetrics;  Laterality: N/A;  . CESAREAN SECTION    . DILATION AND CURETTAGE OF UTERUS    . MASTECTOMY W/ SENTINEL NODE BIOPSY Bilateral 03/13/2020   Procedure: BILATERAL MASTECTOMY WITH RIGHT SENTINEL LYMPH NODE BIOPSY;  Surgeon: Jovita Kussmaul, MD;  Location: [facility];  Service: General;  Laterality: Bilateral;  PEC BLOCK  . WISDOM TOOTH EXTRACTION      There were no vitals filed for this visit.   Subjective Assessment - 05/08/20 0911    Subjective My ROM is good. It got tight again. Moving my arm across my body is very sore.    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  She has bilateral mastecomy on 03/13/2020 with immediate expanders Plans to have implants on June 13, 2020    Patient Stated Goals to return arms back to normal, to be able to wash her own hair.    Currently in Pain? No/denies    Pain Score 0-No pain                             [facility] Adult PT Treatment/Exercise - 05/08/20 0001      Manual Therapy   Manual Therapy Manual Lymphatic Drainage (MLD);Passive ROM;Soft tissue mobilization    Soft tissue mobilization to bil serratus and latissimus mostly in sidelying    Manual Lymphatic Drainage (MLD) short neck, 5 diaphargatic breaths, right inguinal nodes and establishment of axillo inguinal pathway, then R lateral and anterior chest moving fluid towards pathways then retracing all steps, repeated on left side    Passive ROM to right shoulder in direction of flexion,  abduction and ER with prolonged holds                    PT Short Term Goals - 04/03/20 1612      PT SHORT TERM GOAL #1   Title Pt will be familiar with lymphedema risk reduction practices    Time 4    Period Weeks    Status New             PT Long Term Goals - 04/03/20 1612      PT LONG TERM GOAL #1   Title Pt will have > 150 degrees of right and left shoulder abduction so that she can return to her normal activities    Time 8    Period Weeks    Status New      PT LONG TERM GOAL #2   Title Pt will be inpependent in a HEP  for shoulder ROM and strength    Time 8    Period Weeks    Status New                 Plan - 05/08/20 1021    Clinical Impression Statement Pt  continues to demonstrate some swelling at lateral trunk bilaterally with right worse than left. Educated pt to try and avoid ice to see if this helps swelling reduce since extreme changes in temperature can increase swelling on side that lymph nodes were removed. Continued MLD to bilateral trunk. Pt continues to demonstrate decreasing tightness at end range and good overall shoulder ROM. She benefits from massage to bilateral serratus and lats in areas of muscle tightness where R is worse than left.    PT Frequency 1x / week    PT Duration 8 weeks    PT Treatment/Interventions ADLs/Self Care Home Management;DME Instruction;Therapeutic activities;Therapeutic exercise;Neuromuscular re-education;Patient/family education;Manual techniques;Manual lymph drainage;Passive range of motion;Scar mobilization    PT Next Visit Plan cont MLD right chest around expander as needed, PROM, STM to decrease soreness post fills      assess indep with supine scap, cont with PROM, instruct in Strength ABC eventually.  Make sure pt has a sleeve prior to D/C    PT Home Exercise Plan scap retraction, supine dowel, supine scap    Consulted and Agree with Plan of Care Patient           Patient will benefit from skilled therapeutic intervention in order to improve the following deficits and impairments:  Decreased skin integrity, Increased muscle spasms, Impaired perceived functional ability, Decreased range of motion, Decreased knowledge of precautions, Decreased activity tolerance, Decreased knowledge of use of DME, Decreased strength, Increased fascial restricitons, Impaired UE functional use, Postural dysfunction, Pain  Visit Diagnosis: Stiffness of right shoulder, not elsewhere classified  Aftercare following surgery for neoplasm  Stiffness of left shoulder, not elsewhere classified     Problem List Patient Active Problem List   Diagnosis Date Noted  . Acquired absence of breast 03/22/2020  . Breast cancer ([place])  03/13/2020  . Genetic testing 01/05/2020  . Family history of breast cancer   . Family history of leukemia   . 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 Obesity, Class III, BMI 40-49.9 (morbid obesity) ([place]) 12/29/2017  . Benign essential HTN 06/13/2015    Allyson Sabal [facility] 05/08/2020, 10:23 AM  [place], [place], [address] Phone: [phone]   Fax:  [phone]  Name: Stacey Singh  MRN: [id number] Date of Birth: [birth year]  Allyson Sabal [place], PT 05/08/20 10:23 AM

## 2020-05-14 ENCOUNTER — Encounter: Payer: Self-pay | Admitting: Surgical

## 2020-05-14 ENCOUNTER — Other Ambulatory Visit: Payer: Self-pay

## 2020-05-14 ENCOUNTER — Ambulatory Visit (INDEPENDENT_AMBULATORY_CARE_PROVIDER_SITE_OTHER): Payer: BC Managed Care – PPO | Admitting: Surgical

## 2020-05-14 VITALS — BP 111/65 | HR 78 | Temp 98.7°F | Ht 63.0 in | Wt 222.6 lb

## 2020-05-14 DIAGNOSIS — D0511 Intraductal carcinoma in situ of right breast: Secondary | ICD-10-CM

## 2020-05-14 DIAGNOSIS — Z9013 Acquired absence of bilateral breasts and nipples: Secondary | ICD-10-CM

## 2020-05-14 MED ORDER — CIPROFLOXACIN HCL 500 MG PO TABS: 500.0000 mg | ORAL_TABLET | Freq: Two times a day (BID) | ORAL | 0 refills | Status: DC

## 2020-05-14 MED ORDER — ONDANSETRON HCL 4 MG PO TABS: 4.0000 mg | ORAL_TABLET | Freq: Three times a day (TID) | ORAL | 0 refills | Status: DC | PRN

## 2020-05-14 MED ORDER — DIAZEPAM 2 MG PO TABS: 2.0000 mg | ORAL_TABLET | Freq: Two times a day (BID) | ORAL | 0 refills | Status: DC | PRN

## 2020-05-14 MED ORDER — HYDROCODONE-ACETAMINOPHEN 5-325 MG PO TABS
1.0000 | ORAL_TABLET | Freq: Four times a day (QID) | ORAL | 0 refills | Status: AC | PRN
Start: 2020-05-14 — End: 2020-05-19

## 2020-05-14 NOTE — H&P (View-Only) (Signed)
Patient ID: Stacey Singh, female    DOB: 1976-04-25, 44 y.o.   MRN: [id number]  Chief Complaint  Patient presents with  . Pre-op Exam      ICD-10-CM   1. S/P bilateral mastectomy  Z90.13   2. 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 Ductal carcinoma in situ (DCIS) of right breast  D05.11     History of Present Illness: Stacey Singh is a 44 y.o.  female  with a history of right ductal carcinoma in situ, ERPR+.  She presents for preoperative evaluation for upcoming procedure, removal of bilateral tissue expanders and placement of bilateral breast implants, scheduled for 06/13/20 with Dr. Marla Roe  The patient has not had problems with anesthesia. No history of DVT/PE.  No family history of DVT/PE.  No family or personal history of bleeding or clotting disorders.  Patient is not currently taking any blood thinners.  No history of CVA/MI.   Summary of Previous Visit: Currently has 475/535 cc in each expander.   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 Penicillins   . Penicillins     Childhood reaction    Current Medications:  Current Outpatient Medications:  .  diazepam (VALIUM) 2 MG tablet, Take 1 tablet (2 mg total) by mouth every 12 (twelve) hours as needed for muscle spasms., Disp: 20 tablet, Rfl: 0 .  olmesartan (BENICAR) 40 MG tablet, Take 1 tablet (40 mg total) by mouth daily., Disp: 90 tablet, Rfl: 3 .  triamcinolone (KENALOG) 0.025 % ointment, Apply 1 application topically 2 (two) times daily., Disp: 30 g, Rfl: 0  Past Medical Problems: Past Medical History:  Diagnosis Date  . Allergy   . Family history of breast cancer   . Family history of leukemia   . Heart murmur    in high school, resolved  . Heartburn in pregnancy   . Hypertension    with current pregnancy  . Hypertension   . No pertinent past medical history     Past  Surgical History: Past Surgical History:  Procedure Laterality Date  . BREAST RECONSTRUCTION WITH PLACEMENT OF TISSUE EXPANDER AND FLEX HD (ACELLULAR HYDRATED DERMIS) Bilateral 03/13/2020   Procedure: BILATERAL BREAST RECONSTRUCTION WITH PLACEMENT OF TISSUE EXPANDERS AND FLEX HD (ACELLULAR HYDRATED DERMIS);  Surgeon: Wallace Going, DO;  Location: [facility];  Service: Plastics;  Laterality: Bilateral;  . CESAREAN SECTION  04/04/2012   Procedure: CESAREAN SECTION;  Surgeon: Farrel Gobble. Harrington Challenger, MD;  Location: [facility];  Service: Obstetrics;  Laterality: N/A;  . CESAREAN SECTION N/A 05/19/2015   Procedure: CESAREAN SECTION;  Surgeon: Allyn Kenner, DO;  Location: [facility];  Service: Obstetrics;  Laterality: N/A;  . CESAREAN SECTION    . DILATION AND CURETTAGE OF UTERUS    . MASTECTOMY W/ SENTINEL NODE BIOPSY Bilateral 03/13/2020   Procedure: BILATERAL MASTECTOMY WITH RIGHT SENTINEL LYMPH NODE BIOPSY;  Surgeon: Jovita Kussmaul, MD;  Location: [facility];  Service: General;  Laterality: Bilateral;  PEC BLOCK  . WISDOM TOOTH EXTRACTION      Social History: Social History   Socioeconomic History  . Marital status: Married    Spouse name: Not on file  . Number of children: Not on file  . Years of education: Not on file  . Highest education level: Not on file  Occupational History  . Not  on file  Tobacco Use  . Smoking status: Never Smoker  . Smokeless tobacco: Never Used  Substance and Sexual Activity  . Alcohol use: Not Currently  . Drug use: No  . Sexual activity: Not on file  Other Topics Concern  . Not on file  Social History Narrative   ** Merged History Encounter **       Social Determinants of Health   Financial Resource Strain:   . Difficulty of Paying Living Expenses: Not on file  Food Insecurity:   . Worried About Charity fundraiser in the Last Year: Not on file  . Ran Out of Food in the Last Year: Not on file  Transportation Needs:   .  Lack of Transportation (Medical): Not on file  . Lack of Transportation (Non-Medical): Not on file  Physical Activity:   . Days of Exercise per Week: Not on file  . Minutes of Exercise per Session: Not on file  Stress:   . Feeling of Stress : Not on file  Social Connections:   . Frequency of Communication with Friends and Family: Not on file  . Frequency of Social Gatherings with Friends and Family: Not on file  . Attends Religious Services: Not on file  . Active Member of Clubs or Organizations: Not on file  . Attends Archivist Meetings: Not on file  . Marital Status: Not on file  Intimate Partner Violence:   . Fear of Current or Ex-Partner: Not on file  . Emotionally Abused: Not on file  . Physically Abused: Not on file  . Sexually Abused: Not on file    Family History: Family History  Problem Relation Age of Onset  . Hypertension Mother   . Breast cancer Mother 3       negative genetic testing  . Heart disease Father   . Hypertension Sister   . Hypertension Brother   . Diabetes Brother   . Breast cancer Maternal Grandmother        dx. in her 74s  . Heart disease Maternal Grandfather   . Heart Problems Paternal Grandmother   . Heart Problems Paternal Grandfather   . Breast cancer Paternal Aunt        dx. in her 49s  . Breast cancer Other        dx. in her 35s or 66s (MGM's sister)  . Kidney disease Maternal Uncle   . Leukemia Paternal Aunt 25    Review of Systems: Review of Systems  Constitutional: Negative.   Respiratory: Negative.   Cardiovascular: Negative.   Gastrointestinal: Negative.     Physical Exam: Vital Signs BP 111/65 (BP Location: Left Arm, Patient Position: Sitting, Cuff Size: Large)   Pulse 78   Temp 98.7 F (37.1 C) (Oral)   Ht 5' 3"  (1.6 m)   Wt 222 lb 9.6 oz (101 kg)   LMP 04/16/2020 (Approximate)   SpO2 100%   BMI 39.43 kg/m  Physical Exam Exam conducted with a chaperone present.  Constitutional:      General: She is  not in acute distress.    Appearance: Normal appearance. She is not ill-appearing.  HENT:     Head: Normocephalic and atraumatic.  Eyes:     Pupils: Pupils are equal, round Neck:     Musculoskeletal: Normal range of motion.  Cardiovascular:     Rate and Rhythm: Normal rate and regular rhythm.     Pulses: Normal pulses.     Heart sounds: Normal heart  sounds. No murmur.  Pulmonary:     Effort: Pulmonary effort is normal. No respiratory distress.     Breath sounds: Normal breath sounds. No wheezing.  Abdominal:     General: Abdomen is flat. There is no distension.     Palpations: Abdomen is soft.     Tenderness: There is no abdominal tenderness.  Musculoskeletal: Normal range of motion.  Skin:    General: Skin is warm and dry.     Findings: No erythema or rash.  Neurological:     General: No focal deficit present.     Mental Status: She is alert and oriented to person, place, and time. Mental status is at baseline.     Motor: No weakness.  Psychiatric:        Mood and Affect: Mood normal.        Behavior: Behavior normal.    Assessment/Plan: The patient is scheduled for removal of bilateral tissue expanders and placement of bilateral implants with Dr. Marla Roe.  Risks, benefits, and alternatives of procedure discussed, questions answered and consent obtained.    Patient is still deciding between silicone versus saline implant.  We discussed the differences today and she would like to have a few days to think this over and will call the office with a decision in the next few days.  Smoking Status: non smoker, quit ~ 12 years ago; Counseling Given? NA Last Mammogram: s/p mastectomy bilateral  Caprini Score: 6; Risk Factors include: age, BMI > 25, hx of cancer, and length of planned surgery. Recommendation for mechanical and pharmacological prophylaxis. Encourage early ambulation.   Pictures obtained: today's visit  Post-op Rx sent to pharmacy: norco, zofran, cipro,  valium  Patient was provided with the breast reconstruction and general Surgical Risk consent document and Pain Medication Agreement prior to their appointment.  They had adequate time to read through the risk consent documents and Pain Medication Agreement. We also discussed them in person together during this preop appointment. All of their questions were answered to their satisfaction.  Recommended calling if they have any further questions.  Risk consent form and Pain Medication Agreement to be scanned into patient's chart.  The risks that can be encountered with and after placement of a breast implant placement were discussed and include the following but not limited to these: bleeding, infection, delayed healing, anesthesia risks, skin sensation changes, injury to structures including nerves, blood vessels, and muscles which may be temporary or permanent, allergies to tape, suture materials and glues, blood products, topical preparations or injected agents, skin contour irregularities, skin discoloration and swelling, deep vein thrombosis, cardiac and pulmonary complications, pain, which may persist, fluid accumulation, wrinkling of the skin over the implant, changes in nipple or breast sensation, implant leakage or rupture, faulty position of the implant, persistent pain, formation of tight scar tissue around the implant (capsular contracture), possible need for revisional surgery or staged procedures.   Electronically signed by: Carola Rhine Alvaretta Eisenberger, PA-C 05/14/2020 9:13 AM

## 2020-05-14 NOTE — Progress Notes (Signed)
Patient ID: Stacey Singh, female    DOB: 1976/06/29, 44 y.o.   MRN: [id number]  Chief Complaint  Patient presents with  . Pre-op Exam      ICD-10-CM   1. S/P bilateral mastectomy  Z90.13   2. 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 Ductal carcinoma in situ (DCIS) of right breast  D05.11     History of Present Illness: Stacey Singh is a 44 y.o.  female  with a history of right ductal carcinoma in situ, ERPR+.  She presents for preoperative evaluation for upcoming procedure, removal of bilateral tissue expanders and placement of bilateral breast implants, scheduled for 06/13/20 with Dr. Marla Roe  The patient has not had problems with anesthesia. No history of DVT/PE.  No family history of DVT/PE.  No family or personal history of bleeding or clotting disorders.  Patient is not currently taking any blood thinners.  No history of CVA/MI.   Summary of Previous Visit: Currently has 475/535 cc in each expander.   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 Penicillins   . Penicillins     Childhood reaction    Current Medications:  Current Outpatient Medications:  .  diazepam (VALIUM) 2 MG tablet, Take 1 tablet (2 mg total) by mouth every 12 (twelve) hours as needed for muscle spasms., Disp: 20 tablet, Rfl: 0 .  olmesartan (BENICAR) 40 MG tablet, Take 1 tablet (40 mg total) by mouth daily., Disp: 90 tablet, Rfl: 3 .  triamcinolone (KENALOG) 0.025 % ointment, Apply 1 application topically 2 (two) times daily., Disp: 30 g, Rfl: 0  Past Medical Problems: Past Medical History:  Diagnosis Date  . Allergy   . Family history of breast cancer   . Family history of leukemia   . Heart murmur    in high school, resolved  . Heartburn in pregnancy   . Hypertension    with current pregnancy  . Hypertension   . No pertinent past medical history     Past  Surgical History: Past Surgical History:  Procedure Laterality Date  . BREAST RECONSTRUCTION WITH PLACEMENT OF TISSUE EXPANDER AND FLEX HD (ACELLULAR HYDRATED DERMIS) Bilateral 03/13/2020   Procedure: BILATERAL BREAST RECONSTRUCTION WITH PLACEMENT OF TISSUE EXPANDERS AND FLEX HD (ACELLULAR HYDRATED DERMIS);  Surgeon: Wallace Going, DO;  Location: [facility];  Service: Plastics;  Laterality: Bilateral;  . CESAREAN SECTION  04/04/2012   Procedure: CESAREAN SECTION;  Surgeon: Farrel Gobble. Harrington Challenger, MD;  Location: [facility];  Service: Obstetrics;  Laterality: N/A;  . CESAREAN SECTION N/A 05/19/2015   Procedure: CESAREAN SECTION;  Surgeon: Allyn Kenner, DO;  Location: [facility];  Service: Obstetrics;  Laterality: N/A;  . CESAREAN SECTION    . DILATION AND CURETTAGE OF UTERUS    . MASTECTOMY W/ SENTINEL NODE BIOPSY Bilateral 03/13/2020   Procedure: BILATERAL MASTECTOMY WITH RIGHT SENTINEL LYMPH NODE BIOPSY;  Surgeon: Jovita Kussmaul, MD;  Location: [facility];  Service: General;  Laterality: Bilateral;  PEC BLOCK  . WISDOM TOOTH EXTRACTION      Social History: Social History   Socioeconomic History  . Marital status: Married    Spouse name: Not on file  . Number of children: Not on file  . Years of education: Not on file  . Highest education level: Not on file  Occupational History  . Not  on file  Tobacco Use  . Smoking status: Never Smoker  . Smokeless tobacco: Never Used  Substance and Sexual Activity  . Alcohol use: Not Currently  . Drug use: No  . Sexual activity: Not on file  Other Topics Concern  . Not on file  Social History Narrative   ** Merged History Encounter **       Social Determinants of Health   Financial Resource Strain:   . Difficulty of Paying Living Expenses: Not on file  Food Insecurity:   . Worried About Charity fundraiser in the Last Year: Not on file  . Ran Out of Food in the Last Year: Not on file  Transportation Needs:   .  Lack of Transportation (Medical): Not on file  . Lack of Transportation (Non-Medical): Not on file  Physical Activity:   . Days of Exercise per Week: Not on file  . Minutes of Exercise per Session: Not on file  Stress:   . Feeling of Stress : Not on file  Social Connections:   . Frequency of Communication with Friends and Family: Not on file  . Frequency of Social Gatherings with Friends and Family: Not on file  . Attends Religious Services: Not on file  . Active Member of Clubs or Organizations: Not on file  . Attends Archivist Meetings: Not on file  . Marital Status: Not on file  Intimate Partner Violence:   . Fear of Current or Ex-Partner: Not on file  . Emotionally Abused: Not on file  . Physically Abused: Not on file  . Sexually Abused: Not on file    Family History: Family History  Problem Relation Age of Onset  . Hypertension Mother   . Breast cancer Mother 26       negative genetic testing  . Heart disease Father   . Hypertension Sister   . Hypertension Brother   . Diabetes Brother   . Breast cancer Maternal Grandmother        dx. in her 44s  . Heart disease Maternal Grandfather   . Heart Problems Paternal Grandmother   . Heart Problems Paternal Grandfather   . Breast cancer Paternal Aunt        dx. in her 5s  . Breast cancer Other        dx. in her 88s or 9s (MGM's sister)  . Kidney disease Maternal Uncle   . Leukemia Paternal Aunt 76    Review of Systems: Review of Systems  Constitutional: Negative.   Respiratory: Negative.   Cardiovascular: Negative.   Gastrointestinal: Negative.     Physical Exam: Vital Signs BP 111/65 (BP Location: Left Arm, Patient Position: Sitting, Cuff Size: Large)   Pulse 78   Temp 98.7 F (37.1 C) (Oral)   Ht 5' 3"  (1.6 m)   Wt 222 lb 9.6 oz (101 kg)   LMP 04/16/2020 (Approximate)   SpO2 100%   BMI 39.43 kg/m  Physical Exam Exam conducted with a chaperone present.  Constitutional:      General: She is  not in acute distress.    Appearance: Normal appearance. She is not ill-appearing.  HENT:     Head: Normocephalic and atraumatic.  Eyes:     Pupils: Pupils are equal, round Neck:     Musculoskeletal: Normal range of motion.  Cardiovascular:     Rate and Rhythm: Normal rate and regular rhythm.     Pulses: Normal pulses.     Heart sounds: Normal heart  sounds. No murmur.  Pulmonary:     Effort: Pulmonary effort is normal. No respiratory distress.     Breath sounds: Normal breath sounds. No wheezing.  Abdominal:     General: Abdomen is flat. There is no distension.     Palpations: Abdomen is soft.     Tenderness: There is no abdominal tenderness.  Musculoskeletal: Normal range of motion.  Skin:    General: Skin is warm and dry.     Findings: No erythema or rash.  Neurological:     General: No focal deficit present.     Mental Status: She is alert and oriented to person, place, and time. Mental status is at baseline.     Motor: No weakness.  Psychiatric:        Mood and Affect: Mood normal.        Behavior: Behavior normal.    Assessment/Plan: The patient is scheduled for removal of bilateral tissue expanders and placement of bilateral implants with Dr. Marla Roe.  Risks, benefits, and alternatives of procedure discussed, questions answered and consent obtained.    Patient is still deciding between silicone versus saline implant.  We discussed the differences today and she would like to have a few days to think this over and will call the office with a decision in the next few days.  Smoking Status: non smoker, quit ~ 12 years ago; Counseling Given? NA Last Mammogram: s/p mastectomy bilateral  Caprini Score: 6; Risk Factors include: age, BMI > 25, hx of cancer, and length of planned surgery. Recommendation for mechanical and pharmacological prophylaxis. Encourage early ambulation.   Pictures obtained: today's visit  Post-op Rx sent to pharmacy: norco, zofran, cipro,  valium  Patient was provided with the breast reconstruction and general Surgical Risk consent document and Pain Medication Agreement prior to their appointment.  They had adequate time to read through the risk consent documents and Pain Medication Agreement. We also discussed them in person together during this preop appointment. All of their questions were answered to their satisfaction.  Recommended calling if they have any further questions.  Risk consent form and Pain Medication Agreement to be scanned into patient's chart.  The risks that can be encountered with and after placement of a breast implant placement were discussed and include the following but not limited to these: bleeding, infection, delayed healing, anesthesia risks, skin sensation changes, injury to structures including nerves, blood vessels, and muscles which may be temporary or permanent, allergies to tape, suture materials and glues, blood products, topical preparations or injected agents, skin contour irregularities, skin discoloration and swelling, deep vein thrombosis, cardiac and pulmonary complications, pain, which may persist, fluid accumulation, wrinkling of the skin over the implant, changes in nipple or breast sensation, implant leakage or rupture, faulty position of the implant, persistent pain, formation of tight scar tissue around the implant (capsular contracture), possible need for revisional surgery or staged procedures.   Electronically signed by: Carola Rhine Maxxon Schwanke, PA-C 05/14/2020 9:13 AM

## 2020-05-15 ENCOUNTER — Telehealth: Payer: Self-pay

## 2020-05-15 NOTE — Telephone Encounter (Signed)
Patient is requesting call back as she has follow up questions regarding saline vs silicone implants.

## 2020-05-15 NOTE — Telephone Encounter (Signed)
Dr. Marla Roe spoke with patient, will schedule televisit to further discuss on Friday with me

## 2020-05-16 ENCOUNTER — Ambulatory Visit: Payer: BC Managed Care – PPO

## 2020-05-16 ENCOUNTER — Other Ambulatory Visit: Payer: Self-pay

## 2020-05-16 DIAGNOSIS — M25611 Stiffness of right shoulder, not elsewhere classified: Secondary | ICD-10-CM | POA: Diagnosis not present

## 2020-05-16 DIAGNOSIS — R293 Abnormal posture: Secondary | ICD-10-CM

## 2020-05-16 DIAGNOSIS — M25612 Stiffness of left shoulder, not elsewhere classified: Secondary | ICD-10-CM

## 2020-05-16 DIAGNOSIS — M6281 Muscle weakness (generalized): Secondary | ICD-10-CM

## 2020-05-16 DIAGNOSIS — Z483 Aftercare following surgery for neoplasm: Secondary | ICD-10-CM

## 2020-05-16 NOTE — Therapy (Signed)
[place], [place], [address] Phone: [phone]   Fax:  [phone]  Physical Therapy Treatment  Patient Details  Name: Stacey Singh MRN: [id number] Date of Birth: 16-Feb-1976 Referring Provider (PT): Dr. Marla Roe    Encounter Date: 05/16/2020   PT End of Session - 05/16/20 1056    Visit Number 7    Number of Visits 9    Date for PT Re-Evaluation 06/04/20    PT Start Time 0804    PT Stop Time 0850    PT Time Calculation (min) 46 min    Activity Tolerance Patient tolerated treatment well    Behavior During Therapy [facility] for tasks assessed/performed           Past Medical History:  Diagnosis Date  . Allergy   . Family history of breast cancer   . Family history of leukemia   . Heart murmur    in high school, resolved  . Heartburn in pregnancy   . Hypertension    with current pregnancy  . Hypertension   . No pertinent past medical history     Past Surgical History:  Procedure Laterality Date  . BREAST RECONSTRUCTION WITH PLACEMENT OF TISSUE EXPANDER AND FLEX HD (ACELLULAR HYDRATED DERMIS) Bilateral 03/13/2020   Procedure: BILATERAL BREAST RECONSTRUCTION WITH PLACEMENT OF TISSUE EXPANDERS AND FLEX HD (ACELLULAR HYDRATED DERMIS);  Surgeon: Wallace Going, DO;  Location: [facility];  Service: Plastics;  Laterality: Bilateral;  . CESAREAN SECTION  04/04/2012   Procedure: CESAREAN SECTION;  Surgeon: Farrel Gobble. Harrington Challenger, MD;  Location: [facility];  Service: Obstetrics;  Laterality: N/A;  . CESAREAN SECTION N/A 05/19/2015   Procedure: CESAREAN SECTION;  Surgeon: Allyn Kenner, DO;  Location: [facility];  Service: Obstetrics;  Laterality: N/A;  . CESAREAN SECTION    . DILATION AND CURETTAGE OF UTERUS    . MASTECTOMY W/ SENTINEL NODE BIOPSY Bilateral 03/13/2020   Procedure: BILATERAL MASTECTOMY WITH RIGHT SENTINEL LYMPH NODE BIOPSY;  Surgeon: Jovita Kussmaul, MD;  Location: [facility];  Service: General;  Laterality: Bilateral;  PEC BLOCK  . WISDOM TOOTH EXTRACTION      There were no vitals filed for this visit.   Subjective Assessment - 05/16/20 0805    Subjective Had a fill up on Tuesday;the last one.  Not too sore.  Still very sore laterally. Using the chip bag on the right.  With shoulder ROM feel pulling at the lateral trunk.    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  She has bilateral mastecomy on 03/13/2020 with immediate expanders Plans to have implants on June 13, 2020    Patient Stated Goals to return arms back to normal, to be able to wash her own hair.    Currently in Pain? No/denies    Pain Score 0-No pain                             [facility] Adult PT Treatment/Exercise - 05/16/20 0001      Manual Therapy   Soft tissue mobilization to bil serratus and latissimus mostly in sidelying    Manual Lymphatic Drainage (MLD) short neck, 5 diaphargatic breaths, right inguinal nodes and establishment of axillo inguinal pathway, then R lateral and anterior chest moving fluid towards pathways then retracing all steps, repeated on left side    Passive ROM PROM right  shoulder flex and abd                  PT Education - 05/16/20 1055    Education Details Educated in standing lat stretch at counter.    Person(s) Educated Patient    Methods Demonstration;Explanation    Comprehension Verbalized understanding            PT Short Term Goals - 04/03/20 1612      PT SHORT TERM GOAL #1   Title Pt will be familiar with lymphedema risk reduction practices    Time 4    Period Weeks    Status New             PT Long Term Goals - 04/03/20 1612      PT LONG TERM GOAL #1   Title Pt will have > 150 degrees of right and left shoulder abduction so that she can return to her normal activities    Time 8    Period Weeks    Status New      PT LONG TERM GOAL #2   Title Pt will be  inpependent in a HEP  for shoulder ROM and strength    Time 8    Period Weeks    Status New                 Plan - 05/16/20 1057    Clinical Impression Statement Pt continues with mild right lateral trunk edema improving with chip pack.  Decreased edema on left today. Still complains of tightness in lats with ROM activities but able to achieve near full PROM.  Increased tissue tension noted in right lats greater than left.    Personal Factors and Comorbidities Comorbidity 2    Comorbidities bilateral mastecomy, expanders with fills    Stability/Clinical Decision Making Stable/Uncomplicated    Rehab Potential Good    PT Frequency 1x / week    PT Duration 8 weeks    PT Treatment/Interventions ADLs/Self Care Home Management;DME Instruction;Therapeutic activities;Therapeutic exercise;Neuromuscular re-education;Patient/family education;Manual techniques;Manual lymph drainage;Passive range of motion;Scar mobilization    PT Next Visit Plan check for returned script for compression sleeve, Cont MLD, STM, PROM    Recommended Other Services Script for right prophylactic compression sleeve faxed to Phoebe Sharps PA-C    Consulted and Agree with Plan of Care Patient           Patient will benefit from skilled therapeutic intervention in order to improve the following deficits and impairments:  Decreased skin integrity, Increased muscle spasms, Impaired perceived functional ability, Decreased range of motion, Decreased knowledge of precautions, Decreased activity tolerance, Decreased knowledge of use of DME, Decreased strength, Increased fascial restricitons, Impaired UE functional use, Postural dysfunction, Pain  Visit Diagnosis: Stiffness of right shoulder, not elsewhere classified  Aftercare following surgery for neoplasm  Stiffness of left shoulder, not elsewhere classified  Abnormal posture  Muscle weakness (generalized)     Problem List Patient Active Problem List    Diagnosis Date Noted  . Acquired absence of breast 03/22/2020  . Breast cancer ([facility]) 03/13/2020  . Genetic testing 01/05/2020  . Family history of breast cancer   . Family history of leukemia   . 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 Obesity, Class III, BMI 40-49.9 (morbid obesity) ([facility]) 12/29/2017  . Benign essential HTN 06/13/2015    Claris Pong, PT 05/16/2020, 11:03 AM  [place],  [place], [address] Phone: [phone]   Fax:  [phone]  Name: LOWANA HABLE MRN: [id number] Date of Birth: August 19, 1975

## 2020-05-17 ENCOUNTER — Ambulatory Visit (INDEPENDENT_AMBULATORY_CARE_PROVIDER_SITE_OTHER): Payer: BC Managed Care – PPO | Admitting: Surgical

## 2020-05-17 ENCOUNTER — Encounter: Payer: Self-pay | Admitting: Surgical

## 2020-05-17 DIAGNOSIS — Z9013 Acquired absence of bilateral breasts and nipples: Secondary | ICD-10-CM

## 2020-05-17 NOTE — Progress Notes (Signed)
Patient is a 44 year old female scheduled for removal of bilateral tissue expanders with placement of bilateral breast implants with Dr. Marla Roe on 06/13/2020.  At her preop appointment which she was unsure if she would like to go with a silicone or saline implant.  I discussed with her the pros and cons of each and encouraged her to do her own research as well and we scheduled this virtual visit to further discuss.  Today she reports that she has settled on silicone implants and feels comfortable with this decision.  I discussed with her that we would order the implants and have them available for her surgery.  I recommended she has any further questions or concerns to please call us and we can further discuss.  I will send a message to surgical staff to order in place.  Of note she currently has 475/535 cc in each expander  The patient gave consent to have this visit done by telemedicine / virtual visit.  This is also consent for access the chart and treat the patient via this visit. The patient is located at home.  I, the provider, am at the office.  We spent 5 minutes together for the visit.  Joined by telephone.

## 2020-05-23 ENCOUNTER — Encounter: Payer: Self-pay | Admitting: Rehabilitation

## 2020-05-23 ENCOUNTER — Other Ambulatory Visit: Payer: Self-pay

## 2020-05-23 ENCOUNTER — Ambulatory Visit: Payer: BC Managed Care – PPO | Admitting: Rehabilitation

## 2020-05-23 DIAGNOSIS — M25612 Stiffness of left shoulder, not elsewhere classified: Secondary | ICD-10-CM

## 2020-05-23 DIAGNOSIS — M25611 Stiffness of right shoulder, not elsewhere classified: Secondary | ICD-10-CM

## 2020-05-23 DIAGNOSIS — R293 Abnormal posture: Secondary | ICD-10-CM

## 2020-05-23 DIAGNOSIS — Z483 Aftercare following surgery for neoplasm: Secondary | ICD-10-CM

## 2020-05-23 DIAGNOSIS — M6281 Muscle weakness (generalized): Secondary | ICD-10-CM

## 2020-05-23 NOTE — Therapy (Signed)
[place], [place], [address] Phone: [phone]   Fax:  [phone]  Physical Therapy Treatment  Patient Details  Name: Stacey Singh MRN: [id number] Date of Birth: 1975-12-18 Referring Provider (PT): Dr. Marla Roe    Encounter Date: 05/23/2020   PT End of Session - 05/23/20 1707    Visit Number 8    Number of Visits 9    Date for PT Re-Evaluation 06/04/20    Authorization - Number of Visits 30    PT Start Time 1410    PT Stop Time 1457    PT Time Calculation (min) 47 min    Activity Tolerance Patient tolerated treatment well    Behavior During Therapy [facility] for tasks assessed/performed           Past Medical History:  Diagnosis Date  . Allergy   . Family history of breast cancer   . Family history of leukemia   . Heart murmur    in high school, resolved  . Heartburn in pregnancy   . Hypertension    with current pregnancy  . Hypertension   . No pertinent past medical history     Past Surgical History:  Procedure Laterality Date  . BREAST RECONSTRUCTION WITH PLACEMENT OF TISSUE EXPANDER AND FLEX HD (ACELLULAR HYDRATED DERMIS) Bilateral 03/13/2020   Procedure: BILATERAL BREAST RECONSTRUCTION WITH PLACEMENT OF TISSUE EXPANDERS AND FLEX HD (ACELLULAR HYDRATED DERMIS);  Surgeon: Wallace Going, DO;  Location: [facility];  Service: Plastics;  Laterality: Bilateral;  . CESAREAN SECTION  04/04/2012   Procedure: CESAREAN SECTION;  Surgeon: Farrel Gobble. Harrington Challenger, MD;  Location: [facility];  Service: Obstetrics;  Laterality: N/A;  . CESAREAN SECTION N/A 05/19/2015   Procedure: CESAREAN SECTION;  Surgeon: Allyn Kenner, DO;  Location: [facility];  Service: Obstetrics;  Laterality: N/A;  . CESAREAN SECTION    . DILATION AND CURETTAGE OF UTERUS    . MASTECTOMY W/ SENTINEL NODE BIOPSY Bilateral 03/13/2020   Procedure: BILATERAL MASTECTOMY WITH RIGHT SENTINEL LYMPH NODE BIOPSY;  Surgeon: Jovita Kussmaul, MD;  Location: [facility];  Service: General;  Laterality: Bilateral;  PEC BLOCK  . WISDOM TOOTH EXTRACTION      There were no vitals filed for this visit.   Subjective Assessment - 05/23/20 1411    Subjective I am feeling good.  Still tight on the right top and bottom no fills since last one.    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  She has bilateral mastecomy on 03/13/2020 with immediate expanders Plans to have implants on June 13, 2020    Patient Stated Goals to return arms back to normal, to be able to wash her own hair.    Currently in Pain? No/denies                             [facility] Adult PT Treatment/Exercise - 05/23/20 0001      Manual Therapy   Soft tissue mobilization to bil serratus and latissimus mostly in sidelying Rt>Lt.  Lt showing no restrictions today    Manual Lymphatic Drainage (MLD) short neck, superficial and deep abdominals, 5 diaphragmatic breaths, right inguinal nodes and establishment of axillo inguinal pathway, then R lateral and anterior chest moving fluid towards pathways then retracing all steps, repeated on left side    Passive ROM PROM bil shoulder flex and  abd                    PT Short Term Goals - 04/03/20 1612      PT SHORT TERM GOAL #1   Title Pt will be familiar with lymphedema risk reduction practices    Time 4    Period Weeks    Status New             PT Long Term Goals - 04/03/20 1612      PT LONG TERM GOAL #1   Title Pt will have > 150 degrees of right and left shoulder abduction so that she can return to her normal activities    Time 8    Period Weeks    Status New      PT LONG TERM GOAL #2   Title Pt will be inpependent in a HEP  for shoulder ROM and strength    Time 8    Period Weeks    Status New                 Plan - 05/23/20 1707    Clinical Impression Statement Pt has been experiencing less pain and tightness  overall since recovering from the last fill but still continues with general expander tightness and discomfort Rt>Lt.  General Rt edema present more in sidelying.  pt will continue with 2 more visits until implant exchange and then will follow up a few weeks post surgery for any more needs.    PT Frequency 1x / week    PT Duration 8 weeks    PT Treatment/Interventions ADLs/Self Care Home Management;DME Instruction;Therapeutic activities;Therapeutic exercise;Neuromuscular re-education;Patient/family education;Manual techniques;Manual lymph drainage;Passive range of motion;Scar mobilization    PT Next Visit Plan Cont MLD, STM, PROM until surgery, get measured for sleeve?    Recommended Other Services Pt given prescription for sleeve from Dr. Lindi Adie on 05/23/20    Consulted and Agree with Plan of Care Patient           Patient will benefit from skilled therapeutic intervention in order to improve the following deficits and impairments:     Visit Diagnosis: Stiffness of right shoulder, not elsewhere classified  Aftercare following surgery for neoplasm  Stiffness of left shoulder, not elsewhere classified  Abnormal posture  Muscle weakness (generalized)     Problem List Patient Active Problem List   Diagnosis Date Noted  . Acquired absence of breast 03/22/2020  . Breast cancer ([place]) 03/13/2020  . Genetic testing 01/05/2020  . Family history of breast cancer   . Family history of leukemia   . 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 Obesity, Class III, BMI 40-49.9 (morbid obesity) ([place]) 12/29/2017  . Benign essential HTN 06/13/2015    Stark Bray 05/23/2020, 5:10 PM  [place], [place], [address] Phone: [phone]   Fax:  [phone]  Name: Stacey Singh MRN: [id number] Date of Birth: 27-Apr-1976

## 2020-05-30 ENCOUNTER — Ambulatory Visit: Payer: BC Managed Care – PPO | Attending: Plastic Surgery

## 2020-05-30 ENCOUNTER — Other Ambulatory Visit: Payer: Self-pay

## 2020-05-30 DIAGNOSIS — M6281 Muscle weakness (generalized): Secondary | ICD-10-CM | POA: Diagnosis present

## 2020-05-30 DIAGNOSIS — M25612 Stiffness of left shoulder, not elsewhere classified: Secondary | ICD-10-CM | POA: Insufficient documentation

## 2020-05-30 DIAGNOSIS — M25611 Stiffness of right shoulder, not elsewhere classified: Secondary | ICD-10-CM | POA: Diagnosis present

## 2020-05-30 DIAGNOSIS — R293 Abnormal posture: Secondary | ICD-10-CM | POA: Diagnosis present

## 2020-05-30 DIAGNOSIS — Z483 Aftercare following surgery for neoplasm: Secondary | ICD-10-CM | POA: Insufficient documentation

## 2020-05-30 NOTE — Therapy (Signed)
[place], [place], [address] Phone: [phone]   Fax:  [phone]  Physical Therapy Treatment  Patient Details  Name: ODESTER NILSON MRN: [id number] Date of Birth: 1976-04-09 Referring Provider (PT): Dr. Marla Roe    Encounter Date: 05/30/2020   PT End of Session - 05/30/20 1500    Visit Number 9    Number of Visits 25    Date for PT Re-Evaluation 07/25/20    PT Start Time 7654    PT Stop Time 1455    PT Time Calculation (min) 48 min    Activity Tolerance Patient tolerated treatment well    Behavior During Therapy [facility] for tasks assessed/performed           Past Medical History:  Diagnosis Date  . Allergy   . Family history of breast cancer   . Family history of leukemia   . Heart murmur    in high school, resolved  . Heartburn in pregnancy   . Hypertension    with current pregnancy  . Hypertension   . No pertinent past medical history     Past Surgical History:  Procedure Laterality Date  . BREAST RECONSTRUCTION WITH PLACEMENT OF TISSUE EXPANDER AND FLEX HD (ACELLULAR HYDRATED DERMIS) Bilateral 03/13/2020   Procedure: BILATERAL BREAST RECONSTRUCTION WITH PLACEMENT OF TISSUE EXPANDERS AND FLEX HD (ACELLULAR HYDRATED DERMIS);  Surgeon: Wallace Going, DO;  Location: [place];  Service: Plastics;  Laterality: Bilateral;  . CESAREAN SECTION  04/04/2012   Procedure: CESAREAN SECTION;  Surgeon: Farrel Gobble. Harrington Challenger, MD;  Location: [name];  Service: Obstetrics;  Laterality: N/A;  . CESAREAN SECTION N/A 05/19/2015   Procedure: CESAREAN SECTION;  Surgeon: Allyn Kenner, DO;  Location: [facility];  Service: Obstetrics;  Laterality: N/A;  . CESAREAN SECTION    . DILATION AND CURETTAGE OF UTERUS    . MASTECTOMY W/ SENTINEL NODE BIOPSY Bilateral 03/13/2020   Procedure: BILATERAL MASTECTOMY WITH RIGHT SENTINEL LYMPH NODE BIOPSY;  Surgeon: Jovita Kussmaul, MD;  Location: [place];  Service: General;  Laterality: Bilateral;  PEC BLOCK  . WISDOM TOOTH EXTRACTION      There were no vitals filed for this visit.   Subjective Assessment - 05/30/20 1408    Subjective Has not gotten measured from sleeve yet but will go tomorrow..  Each week I feel better, trying to do more.  Still feels really tight in axilla region.  Still have some mild swelling laterally but its no worse.    Currently in Pain? No/denies              [facility] PT Assessment - 05/30/20 0001      AROM   Right Shoulder Extension 65 Degrees    Right Shoulder Flexion 165 Degrees    Right Shoulder ABduction 170 Degrees    Right Shoulder Internal Rotation --   WNL   Left Shoulder Extension 65 Degrees    Left Shoulder Flexion 165 Degrees    Left Shoulder ABduction 165 Degrees             LYMPHEDEMA/ONCOLOGY QUESTIONNAIRE - 05/30/20 0001      Right Upper Extremity Lymphedema   10 cm Proximal to Olecranon Process 35.3 cm    Olecranon Process 27.2 cm    15 cm Proximal to Ulnar Styloid Process 25.9 cm    Just Proximal to Ulnar Styloid Process 15.6 cm    Across Hand at [employer] 19.5  cm    At [facility] of 2nd Digit 6.5 cm      Left Upper Extremity Lymphedema   10 cm Proximal to Olecranon Process 37 cm    Olecranon Process 27.5 cm    15 cm Proximal to Ulnar Styloid Process 24.4 cm    Just Proximal to Ulnar Styloid Process 15.5 cm    Across Hand at [employer] 18.3 cm    At [place] of 2nd Digit 6.3 cm              Quick Dash - 05/30/20 0001    Open a tight or new jar Moderate difficulty    Do heavy household chores (wash walls, wash floors) Moderate difficulty    Carry a shopping bag or briefcase Moderate difficulty    Wash your back No difficulty    Use a knife to cut food No difficulty    Recreational activities in which you take some force or impact through your arm, shoulder, or hand (golf, hammering, tennis) Mild difficulty    During the past week, to what extent has  your arm, shoulder or hand problem interfered with your normal social activities with family, friends, neighbors, or groups? Slightly    During the past week, to what extent has your arm, shoulder or hand problem limited your work or other regular daily activities Modererately    Arm, shoulder, or hand pain. None    Tingling (pins and needles) in your arm, shoulder, or hand None    Difficulty Sleeping No difficulty    DASH Score 22.73 %                  [facility] Adult PT Treatment/Exercise - 05/30/20 0001      Shoulder Exercises: Pulleys   Flexion 2 minutes    ABduction 2 minutes                    PT Short Term Goals - 05/30/20 1433      PT SHORT TERM GOAL #1   Time 4    Period Weeks    Status Partially Met             PT Long Term Goals - 05/30/20 1434      PT LONG TERM GOAL #1   Time 8    Period Weeks    Status Achieved      PT LONG TERM GOAL #2   Time 8    Period Weeks    Status Achieved      PT LONG TERM GOAL #3   Title Able to fully brush the back of her hair without limitation    Time 6    Period Weeks    Status New      PT LONG TERM GOAL #4   Title Able to open jars without difficulty    Time 6    Period Weeks    Status New    Target Date 07/11/20      PT LONG TERM GOAL #5   Title Quick dash no greater than 10%    Time 8    Period Weeks    Status New                 Plan - 05/30/20 1502    Clinical Impression Statement Pt has improved with shoulder ROM but still feels deficits with brushing the back of her hair, raching across her body, opening jars and strength in general.  She  is having tissue expanders removed on Nov. 18 and we are extending her visits to after her surgery to assess for any deficits at that time.  She is being fit for her sleeve tomorrow. She still has mild right lateral trunk swelling not addressed today secondary to recert being done    Personal Factors and Comorbidities Comorbidity 2     Examination-Activity Limitations Caring for Others;Bend;Reach Overhead;Hygiene/Grooming    Examination-Participation Restrictions Meal Prep;Laundry    Stability/Clinical Decision Making Stable/Uncomplicated    Rehab Potential Good    PT Frequency 2x / week    PT Duration 8 weeks   for a total of 16 weeks prn   PT Treatment/Interventions ADLs/Self Care Home Management;DME Instruction;Therapeutic activities;Therapeutic exercise;Neuromuscular re-education;Patient/family education;Manual techniques;Manual lymph drainage;Passive range of motion;Scar mobilization    PT Next Visit Plan MLD, STM, PROM    PT Home Exercise Plan scap retraction, supine dowel, supine scap    Recommended Other Services getting measured tomorrow.    Consulted and Agree with Plan of Care Patient           Patient will benefit from skilled therapeutic intervention in order to improve the following deficits and impairments:  Decreased skin integrity, Increased muscle spasms, Impaired perceived functional ability, Decreased range of motion, Decreased knowledge of precautions, Decreased activity tolerance, Decreased knowledge of use of DME, Decreased strength, Increased fascial restricitons, Impaired UE functional use, Postural dysfunction, Pain  Visit Diagnosis: Stiffness of right shoulder, not elsewhere classified  Aftercare following surgery for neoplasm  Stiffness of left shoulder, not elsewhere classified  Abnormal posture  Muscle weakness (generalized)     Problem List Patient Active Problem List   Diagnosis Date Noted  . Acquired absence of breast 03/22/2020  . Breast cancer ([place]) 03/13/2020  . Genetic testing 01/05/2020  . Family history of breast cancer   . Family history of leukemia   . 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 Obesity, Class III, BMI 40-49.9 (morbid obesity) ([place]) 12/29/2017  . Benign essential HTN 06/13/2015    Sherrin Daisy 05/30/2020, 5:18 PM  [place], [place], [address] Phone: [phone]   Fax:  [phone]  Name: LUBNA STEGEMAN MRN: [id number] Date of Birth: 09/10/1975

## 2020-05-31 ENCOUNTER — Encounter: Payer: Self-pay | Admitting: Plastic Surgery

## 2020-06-04 ENCOUNTER — Telehealth: Payer: Self-pay

## 2020-06-04 NOTE — Telephone Encounter (Signed)
Patient left voicemail to see if we received her questions and concerns regarding her upcoming surgery via My Chart on Nov. 5.  Please give her a call.

## 2020-06-05 ENCOUNTER — Other Ambulatory Visit: Payer: Self-pay

## 2020-06-05 ENCOUNTER — Ambulatory Visit: Payer: BC Managed Care – PPO

## 2020-06-05 DIAGNOSIS — R293 Abnormal posture: Secondary | ICD-10-CM

## 2020-06-05 DIAGNOSIS — Z483 Aftercare following surgery for neoplasm: Secondary | ICD-10-CM

## 2020-06-05 DIAGNOSIS — M25611 Stiffness of right shoulder, not elsewhere classified: Secondary | ICD-10-CM

## 2020-06-05 DIAGNOSIS — M6281 Muscle weakness (generalized): Secondary | ICD-10-CM

## 2020-06-05 DIAGNOSIS — M25612 Stiffness of left shoulder, not elsewhere classified: Secondary | ICD-10-CM

## 2020-06-05 NOTE — Telephone Encounter (Signed)
Spoke with patient, we discussed her concerns and all of her questions were answered.

## 2020-06-05 NOTE — Therapy (Signed)
[place], [place], [address] Phone: [phone]   Fax:  [phone]  Physical Therapy Treatment  Patient Details  Name: Stacey Singh MRN: [id number] Date of Birth: 12-15-1975 Referring Provider (PT): Dr. Marla Roe    Encounter Date: 06/05/2020   PT End of Session - 06/05/20 1744    Visit Number 10    Number of Visits 25    Date for PT Re-Evaluation 07/25/20    PT Start Time 9628    PT Stop Time 1550    PT Time Calculation (min) 44 min    Activity Tolerance Patient tolerated treatment well    Behavior During Therapy [facility] for tasks assessed/performed           Past Medical History:  Diagnosis Date  . Allergy   . Family history of breast cancer   . Family history of leukemia   . Heart murmur    in high school, resolved  . Heartburn in pregnancy   . Hypertension    with current pregnancy  . Hypertension   . No pertinent past medical history     Past Surgical History:  Procedure Laterality Date  . BREAST RECONSTRUCTION WITH PLACEMENT OF TISSUE EXPANDER AND FLEX HD (ACELLULAR HYDRATED DERMIS) Bilateral 03/13/2020   Procedure: BILATERAL BREAST RECONSTRUCTION WITH PLACEMENT OF TISSUE EXPANDERS AND FLEX HD (ACELLULAR HYDRATED DERMIS);  Surgeon: Wallace Going, DO;  Location: [place];  Service: Plastics;  Laterality: Bilateral;  . CESAREAN SECTION  04/04/2012   Procedure: CESAREAN SECTION;  Surgeon: Farrel Gobble. Harrington Challenger, MD;  Location: [facility];  Service: Obstetrics;  Laterality: N/A;  . CESAREAN SECTION N/A 05/19/2015   Procedure: CESAREAN SECTION;  Surgeon: Allyn Kenner, DO;  Location: [facility];  Service: Obstetrics;  Laterality: N/A;  . CESAREAN SECTION    . DILATION AND CURETTAGE OF UTERUS    . MASTECTOMY W/ SENTINEL NODE BIOPSY Bilateral 03/13/2020   Procedure: BILATERAL MASTECTOMY WITH RIGHT SENTINEL LYMPH NODE BIOPSY;  Surgeon: Jovita Kussmaul, MD;  Location: [place];  Service: General;  Laterality: Bilateral;  PEC BLOCK  . WISDOM TOOTH EXTRACTION      There were no vitals filed for this visit.   Subjective Assessment - 06/05/20 1506    Subjective Went to get measured for sleeve but I need a custom.  It was ordered but I don't have it yet.   Shoulders are doing well.Using chip pack at lateral chest.Still feel more swelling at lateral chest on right.    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  She has bilateral mastecomy on 03/13/2020 with immediate expanders Plans to have implants on June 13, 2020    Patient Stated Goals to return arms back to normal, to be able to wash her own hair., perform hygiene    Currently in Pain? No/denies                             [facility] Adult PT Treatment/Exercise - 06/05/20 0001      Shoulder Exercises: Standing   Other Standing Exercises 3 D shoulder x 10      Shoulder Exercises: Pulleys   Flexion 2 minutes    ABduction 2 minutes      Shoulder Exercises: Therapy Ball   Flexion Both;10 reps      Manual Therapy   Edema Management wearing chip pack  in bra    Manual Lymphatic Drainage (MLD) short neck, Inguinal nodes, MLD done to bilateral lateral trunk using axillo inguinal pathways in supine and SL    Passive ROM PROM bilateral Shoulder flex and abd                    PT Short Term Goals - 05/30/20 1433      PT SHORT TERM GOAL #1   Time 4    Period Weeks    Status Partially Met             PT Long Term Goals - 06/05/20 1749      PT LONG TERM GOAL #1   Title Pt will have > 150 degrees of right and left shoulder abduction so that she can return to her normal activities    Time 8    Period Weeks    Status Achieved      PT LONG TERM GOAL #2   Title Pt will be inpependent in a HEP  for shoulder ROM and strength    Time 8    Period Weeks    Status Achieved      PT LONG TERM GOAL #3   Title Able to fully brush the  back of her hair without limitation    Time 6    Period Weeks    Status New      PT LONG TERM GOAL #4   Title Able to open jars without difficulty    Time 6    Period Weeks    Status New    Target Date 07/11/20      PT LONG TERM GOAL #5   Title Quick dash no greater than 10%    Time 8    Period Weeks    Status New      Additional Long Term Goals   Additional Long Term Goals Yes      PT LONG TERM GOAL #6   Title Able to perform personal hygiene with right UE with improved ease    Time 5    Period Weeks    Status New    Target Date 07/17/20      PT LONG TERM GOAL #7   Title Able to turn steering wheel with right upper extremity with improved ease when driving with 1 hand    Time 6    Period Weeks    Status New    Target Date 07/17/20                 Plan - 06/05/20 1745    Clinical Impression Statement Pt was fit for a sleeve but requires custom so will receive in a couple of weeks. ROM is progressing but she notes difficulty turning the wheel if driving with 1 hand, and with personal hygiene using right hand.  She still has mild right trunk swelling.  Shoulder ROM doing very well.  Will have expanders removed next week and implants placed.  She will follow up with [place] in December.    Comorbidities bilateral mastecomy, expanders with fills    Examination-Activity Limitations Caring for Others;Bend;Reach Overhead;Hygiene/Grooming    Examination-Participation Restrictions Meal Prep;Laundry    Stability/Clinical Decision Making Stable/Uncomplicated    Rehab Potential Good    PT Frequency 2x / week    PT Duration 8 weeks    PT Treatment/Interventions ADLs/Self Care Home Management;DME Instruction;Therapeutic activities;Therapeutic exercise;Neuromuscular re-education;Patient/family education;Manual techniques;Manual lymph drainage;Passive range of motion;Scar mobilization    PT  Next Visit Plan check incisions post expander removal and implants, cont MLD and instruct pt,  check shoulder ROM    Consulted and Agree with Plan of Care Patient           Patient will benefit from skilled therapeutic intervention in order to improve the following deficits and impairments:  Decreased skin integrity, Increased muscle spasms, Impaired perceived functional ability, Decreased range of motion, Decreased knowledge of precautions, Decreased activity tolerance, Decreased knowledge of use of DME, Decreased strength, Increased fascial restricitons, Impaired UE functional use, Postural dysfunction, Pain  Visit Diagnosis: Stiffness of right shoulder, not elsewhere classified  Aftercare following surgery for neoplasm  Stiffness of left shoulder, not elsewhere classified  Abnormal posture  Muscle weakness (generalized)     Problem List Patient Active Problem List   Diagnosis Date Noted  . Acquired absence of breast 03/22/2020  . Breast cancer ([place]) 03/13/2020  . Genetic testing 01/05/2020  . Family history of breast cancer   . Family history of leukemia   . 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 Obesity, Class III, BMI 40-49.9 (morbid obesity) ([place]) 12/29/2017  . Benign essential HTN 06/13/2015    Claris Pong 06/05/2020, 5:53 PM  [place], [place], [address] Phone: [phone]   Fax:  [phone]   Name: Stacey Singh MRN: [id number] Date of Birth: 1975/12/03 Cheral Almas, PT 06/05/20 5:54 PM

## 2020-06-06 ENCOUNTER — Other Ambulatory Visit: Payer: Self-pay

## 2020-06-06 ENCOUNTER — Encounter (HOSPITAL_BASED_OUTPATIENT_CLINIC_OR_DEPARTMENT_OTHER): Payer: Self-pay | Admitting: Plastic Surgery

## 2020-06-11 ENCOUNTER — Other Ambulatory Visit (HOSPITAL_COMMUNITY)
Admission: RE | Admit: 2020-06-11 | Discharge: 2020-06-11 | Disposition: A | Discharge status: Home / Self Care | Payer: BC Managed Care – PPO | Source: Ambulatory Visit | Attending: Plastic Surgery | Admitting: Plastic Surgery

## 2020-06-11 DIAGNOSIS — Z806 Family history of leukemia: Secondary | ICD-10-CM | POA: Diagnosis not present

## 2020-06-11 DIAGNOSIS — Z853 Personal history of malignant neoplasm of breast: Secondary | ICD-10-CM | POA: Diagnosis not present

## 2020-06-11 DIAGNOSIS — Z87891 Personal history of nicotine dependence: Secondary | ICD-10-CM | POA: Diagnosis not present

## 2020-06-11 DIAGNOSIS — L905 Scar conditions and fibrosis of skin: Secondary | ICD-10-CM | POA: Diagnosis not present

## 2020-06-11 DIAGNOSIS — Z9013 Acquired absence of bilateral breasts and nipples: Secondary | ICD-10-CM | POA: Diagnosis not present

## 2020-06-11 DIAGNOSIS — Z880 Allergy status to penicillin: Secondary | ICD-10-CM | POA: Diagnosis not present

## 2020-06-11 DIAGNOSIS — D241 Benign neoplasm of right breast: Secondary | ICD-10-CM | POA: Diagnosis not present

## 2020-06-11 DIAGNOSIS — N641 Fat necrosis of breast: Secondary | ICD-10-CM | POA: Diagnosis not present

## 2020-06-11 DIAGNOSIS — Z01812 Encounter for preprocedural laboratory examination: Secondary | ICD-10-CM | POA: Insufficient documentation

## 2020-06-11 DIAGNOSIS — Z421 Encounter for breast reconstruction following mastectomy: Secondary | ICD-10-CM | POA: Diagnosis not present

## 2020-06-11 DIAGNOSIS — D242 Benign neoplasm of left breast: Secondary | ICD-10-CM | POA: Diagnosis not present

## 2020-06-11 DIAGNOSIS — Z20822 Contact with and (suspected) exposure to covid-19: Secondary | ICD-10-CM | POA: Insufficient documentation

## 2020-06-11 DIAGNOSIS — Z803 Family history of malignant neoplasm of breast: Secondary | ICD-10-CM | POA: Diagnosis not present

## 2020-06-11 LAB — SARS CORONAVIRUS 2 (TAT 6-24 HRS): SARS Coronavirus 2: NEGATIVE

## 2020-06-11 NOTE — Progress Notes (Signed)
Surgical soap given to patient with instructions for use.  Patient verbalized understanding of instructions.

## 2020-06-13 ENCOUNTER — Ambulatory Visit (HOSPITAL_BASED_OUTPATIENT_CLINIC_OR_DEPARTMENT_OTHER)
Admission: RE | Admit: 2020-06-13 | Discharge: 2020-06-13 | Disposition: A | Discharge status: Home / Self Care | Payer: BC Managed Care – PPO | Attending: Plastic Surgery | Admitting: Plastic Surgery

## 2020-06-13 ENCOUNTER — Encounter (HOSPITAL_BASED_OUTPATIENT_CLINIC_OR_DEPARTMENT_OTHER)
Admission: RE | Disposition: A | Discharge status: Home / Self Care | Payer: Self-pay | Source: Home / Self Care | Attending: Plastic Surgery

## 2020-06-13 ENCOUNTER — Ambulatory Visit (HOSPITAL_BASED_OUTPATIENT_CLINIC_OR_DEPARTMENT_OTHER): Payer: BC Managed Care – PPO | Admitting: Anesthesiology

## 2020-06-13 ENCOUNTER — Encounter (HOSPITAL_BASED_OUTPATIENT_CLINIC_OR_DEPARTMENT_OTHER): Payer: Self-pay | Admitting: Plastic Surgery

## 2020-06-13 ENCOUNTER — Other Ambulatory Visit: Payer: Self-pay

## 2020-06-13 DIAGNOSIS — Z9013 Acquired absence of bilateral breasts and nipples: Secondary | ICD-10-CM

## 2020-06-13 DIAGNOSIS — D0511 Intraductal carcinoma in situ of right breast: Secondary | ICD-10-CM

## 2020-06-13 DIAGNOSIS — Z421 Encounter for breast reconstruction following mastectomy: Secondary | ICD-10-CM | POA: Insufficient documentation

## 2020-06-13 DIAGNOSIS — L905 Scar conditions and fibrosis of skin: Secondary | ICD-10-CM | POA: Insufficient documentation

## 2020-06-13 DIAGNOSIS — Z806 Family history of leukemia: Secondary | ICD-10-CM | POA: Insufficient documentation

## 2020-06-13 DIAGNOSIS — Z20822 Contact with and (suspected) exposure to covid-19: Secondary | ICD-10-CM | POA: Insufficient documentation

## 2020-06-13 DIAGNOSIS — Z853 Personal history of malignant neoplasm of breast: Secondary | ICD-10-CM | POA: Insufficient documentation

## 2020-06-13 DIAGNOSIS — Z87891 Personal history of nicotine dependence: Secondary | ICD-10-CM | POA: Insufficient documentation

## 2020-06-13 DIAGNOSIS — Z880 Allergy status to penicillin: Secondary | ICD-10-CM | POA: Insufficient documentation

## 2020-06-13 DIAGNOSIS — D242 Benign neoplasm of left breast: Secondary | ICD-10-CM | POA: Insufficient documentation

## 2020-06-13 DIAGNOSIS — Z803 Family history of malignant neoplasm of breast: Secondary | ICD-10-CM | POA: Insufficient documentation

## 2020-06-13 DIAGNOSIS — D241 Benign neoplasm of right breast: Secondary | ICD-10-CM | POA: Insufficient documentation

## 2020-06-13 DIAGNOSIS — N641 Fat necrosis of breast: Secondary | ICD-10-CM | POA: Insufficient documentation

## 2020-06-13 HISTORY — PX: REMOVAL OF BILATERAL TISSUE EXPANDERS WITH PLACEMENT OF BILATERAL BREAST IMPLANTS: SHX6431

## 2020-06-13 LAB — POCT PREGNANCY, URINE: Preg Test, Ur: NEGATIVE

## 2020-06-13 SURGERY — REMOVAL, TISSUE EXPANDER, BREAST, BILATERAL, WITH BILATERAL IMPLANT IMPLANT INSERTION
Anesthesia: General | Site: Breast | Laterality: Bilateral

## 2020-06-13 MED ORDER — DEXAMETHASONE SODIUM PHOSPHATE 10 MG/ML IJ SOLN
INTRAMUSCULAR | Status: AC
  Filled 2020-06-13: qty 1

## 2020-06-13 MED ORDER — ONDANSETRON HCL 4 MG/2ML IJ SOLN
INTRAMUSCULAR | Status: DC | PRN
  Administered 2020-06-13: 4 mg via INTRAVENOUS

## 2020-06-13 MED ORDER — OXYCODONE HCL 5 MG PO TABS: 5.0000 mg | ORAL_TABLET | Freq: Once | ORAL | Status: DC | PRN

## 2020-06-13 MED ORDER — DIPHENHYDRAMINE HCL 50 MG/ML IJ SOLN
INTRAMUSCULAR | Status: DC | PRN
  Administered 2020-06-13: 6.25 mg via INTRAVENOUS

## 2020-06-13 MED ORDER — OXYCODONE HCL 5 MG/5ML PO SOLN: 5.0000 mg | Freq: Once | ORAL | Status: DC | PRN

## 2020-06-13 MED ORDER — LIDOCAINE 2% (20 MG/ML) 5 ML SYRINGE
INTRAMUSCULAR | Status: AC
  Filled 2020-06-13: qty 5

## 2020-06-13 MED ORDER — SUCCINYLCHOLINE CHLORIDE 200 MG/10ML IV SOSY
PREFILLED_SYRINGE | INTRAVENOUS | Status: AC
  Filled 2020-06-13: qty 10

## 2020-06-13 MED ORDER — BUPIVACAINE HCL (PF) 0.25 % IJ SOLN
INTRAMUSCULAR | Status: AC
  Filled 2020-06-13: qty 30

## 2020-06-13 MED ORDER — CHLORHEXIDINE GLUCONATE CLOTH 2 % EX PADS: 6.0000 | MEDICATED_PAD | Freq: Once | CUTANEOUS | Status: DC

## 2020-06-13 MED ORDER — CIPROFLOXACIN IN D5W 400 MG/200ML IV SOLN
400.0000 mg | INTRAVENOUS | Status: AC
  Administered 2020-06-13: 400 mg via INTRAVENOUS

## 2020-06-13 MED ORDER — ACETAMINOPHEN 325 MG RE SUPP: 650.0000 mg | RECTAL | Status: DC | PRN

## 2020-06-13 MED ORDER — DIPHENHYDRAMINE HCL 50 MG/ML IJ SOLN
INTRAMUSCULAR | Status: AC
  Filled 2020-06-13: qty 1

## 2020-06-13 MED ORDER — SODIUM CHLORIDE 0.9 % IV SOLN: 250.0000 mL | INTRAVENOUS | Status: DC | PRN

## 2020-06-13 MED ORDER — MIDAZOLAM HCL 5 MG/5ML IJ SOLN
INTRAMUSCULAR | Status: DC | PRN
  Administered 2020-06-13: 2 mg via INTRAVENOUS

## 2020-06-13 MED ORDER — BUPIVACAINE HCL (PF) 0.25 % IJ SOLN
INTRAMUSCULAR | Status: DC | PRN
  Administered 2020-06-13: 30 mL

## 2020-06-13 MED ORDER — MIDAZOLAM HCL 2 MG/2ML IJ SOLN
INTRAMUSCULAR | Status: AC
  Filled 2020-06-13: qty 2

## 2020-06-13 MED ORDER — LIDOCAINE-EPINEPHRINE 1 %-1:100000 IJ SOLN
INTRAMUSCULAR | Status: DC | PRN
  Administered 2020-06-13: 20 mL

## 2020-06-13 MED ORDER — ACETAMINOPHEN 325 MG PO TABS: 650.0000 mg | ORAL_TABLET | ORAL | Status: DC | PRN

## 2020-06-13 MED ORDER — EPHEDRINE SULFATE 50 MG/ML IJ SOLN
INTRAMUSCULAR | Status: DC | PRN
  Administered 2020-06-13 (×2): 10 mg via INTRAVENOUS

## 2020-06-13 MED ORDER — FENTANYL CITRATE (PF) 100 MCG/2ML IJ SOLN
INTRAMUSCULAR | Status: AC
  Filled 2020-06-13: qty 2

## 2020-06-13 MED ORDER — PHENYLEPHRINE 40 MCG/ML (10ML) SYRINGE FOR IV PUSH (FOR BLOOD PRESSURE SUPPORT)
PREFILLED_SYRINGE | INTRAVENOUS | Status: AC
  Filled 2020-06-13: qty 10

## 2020-06-13 MED ORDER — FENTANYL CITRATE (PF) 100 MCG/2ML IJ SOLN: 25.0000 ug | INTRAMUSCULAR | Status: DC | PRN

## 2020-06-13 MED ORDER — LIDOCAINE HCL (CARDIAC) PF 100 MG/5ML IV SOSY
PREFILLED_SYRINGE | INTRAVENOUS | Status: DC | PRN
  Administered 2020-06-13: 25 mg via INTRAVENOUS
  Administered 2020-06-13: 50 mg via INTRAVENOUS

## 2020-06-13 MED ORDER — EPHEDRINE 5 MG/ML INJ
INTRAVENOUS | Status: AC
  Filled 2020-06-13: qty 10

## 2020-06-13 MED ORDER — OXYCODONE HCL 5 MG PO TABS: 5.0000 mg | ORAL_TABLET | ORAL | Status: DC | PRN

## 2020-06-13 MED ORDER — BUPIVACAINE-EPINEPHRINE (PF) 0.25% -1:200000 IJ SOLN
INTRAMUSCULAR | Status: AC
  Filled 2020-06-13: qty 30

## 2020-06-13 MED ORDER — FENTANYL CITRATE (PF) 100 MCG/2ML IJ SOLN
INTRAMUSCULAR | Status: DC | PRN
  Administered 2020-06-13: 50 ug via INTRAVENOUS
  Administered 2020-06-13: 25 ug via INTRAVENOUS
  Administered 2020-06-13: 50 ug via INTRAVENOUS

## 2020-06-13 MED ORDER — DEXAMETHASONE SODIUM PHOSPHATE 4 MG/ML IJ SOLN
INTRAMUSCULAR | Status: DC | PRN
  Administered 2020-06-13: 10 mg via INTRAVENOUS

## 2020-06-13 MED ORDER — SODIUM CHLORIDE 0.9% FLUSH: 3.0000 mL | INTRAVENOUS | Status: DC | PRN

## 2020-06-13 MED ORDER — ONDANSETRON HCL 4 MG/2ML IJ SOLN: 4.0000 mg | Freq: Four times a day (QID) | INTRAMUSCULAR | Status: DC | PRN

## 2020-06-13 MED ORDER — CIPROFLOXACIN IN D5W 400 MG/200ML IV SOLN
INTRAVENOUS | Status: AC
  Filled 2020-06-13: qty 200

## 2020-06-13 MED ORDER — ONDANSETRON HCL 4 MG/2ML IJ SOLN
INTRAMUSCULAR | Status: AC
  Filled 2020-06-13: qty 2

## 2020-06-13 MED ORDER — LACTATED RINGERS IV SOLN: INTRAVENOUS | Status: DC

## 2020-06-13 MED ORDER — MORPHINE SULFATE (PF) 4 MG/ML IV SOLN: 2.0000 mg | INTRAVENOUS | Status: DC | PRN

## 2020-06-13 MED ORDER — SODIUM CHLORIDE 0.9% FLUSH: 3.0000 mL | Freq: Two times a day (BID) | INTRAVENOUS | Status: DC

## 2020-06-13 SURGICAL SUPPLY — 68 items
ADH SKN CLS APL DERMABOND .7 (GAUZE/BANDAGES/DRESSINGS)
BAG DECANTER FOR FLEXI CONT (MISCELLANEOUS) ×3 IMPLANT
BINDER BREAST LRG (GAUZE/BANDAGES/DRESSINGS) IMPLANT
BINDER BREAST MEDIUM (GAUZE/BANDAGES/DRESSINGS) IMPLANT
BINDER BREAST XLRG (GAUZE/BANDAGES/DRESSINGS) ×3 IMPLANT
BINDER BREAST XXLRG (GAUZE/BANDAGES/DRESSINGS) IMPLANT
BIOPATCH RED 1 DISK 7.0 (GAUZE/BANDAGES/DRESSINGS) IMPLANT
BIOPATCH RED 1IN DISK 7.0MM (GAUZE/BANDAGES/DRESSINGS)
BLADE HEX COATED 2.75 (ELECTRODE) ×3 IMPLANT
BLADE SURG 15 STRL LF DISP TIS (BLADE) ×2 IMPLANT
BLADE SURG 15 STRL SS (BLADE) ×6
CANISTER SUCT 1200ML W/VALVE (MISCELLANEOUS) ×3 IMPLANT
COVER BACK TABLE 60X90IN (DRAPES) ×3 IMPLANT
COVER MAYO STAND STRL (DRAPES) ×3 IMPLANT
COVER WAND RF STERILE (DRAPES) IMPLANT
DECANTER SPIKE VIAL GLASS SM (MISCELLANEOUS) ×3 IMPLANT
DERMABOND ADVANCED (GAUZE/BANDAGES/DRESSINGS)
DERMABOND ADVANCED .7 DNX12 (GAUZE/BANDAGES/DRESSINGS) IMPLANT
DRAIN CHANNEL 19F RND (DRAIN) IMPLANT
DRAPE LAPAROSCOPIC ABDOMINAL (DRAPES) ×3 IMPLANT
DRSG OPSITE POSTOP 4X6 (GAUZE/BANDAGES/DRESSINGS) ×6 IMPLANT
DRSG PAD ABDOMINAL 8X10 ST (GAUZE/BANDAGES/DRESSINGS) ×6 IMPLANT
ELECT BLADE 4.0 EZ CLEAN MEGAD (MISCELLANEOUS) ×3
ELECT REM PT RETURN 9FT ADLT (ELECTROSURGICAL) ×3
ELECTRODE BLDE 4.0 EZ CLN MEGD (MISCELLANEOUS) ×1 IMPLANT
ELECTRODE REM PT RTRN 9FT ADLT (ELECTROSURGICAL) ×1 IMPLANT
EVACUATOR SILICONE 100CC (DRAIN) IMPLANT
FUNNEL KELLER 2 DISP (MISCELLANEOUS) IMPLANT
GAUZE SPONGE 4X4 12PLY STRL LF (GAUZE/BANDAGES/DRESSINGS) IMPLANT
GLOVE BIO SURGEON STRL SZ 6.5 (GLOVE) ×6 IMPLANT
GLOVE BIO SURGEON STRL SZ7 (GLOVE) ×6 IMPLANT
GLOVE BIO SURGEONS STRL SZ 6.5 (GLOVE) ×3
GOWN STRL REUS W/ TWL LRG LVL3 (GOWN DISPOSABLE) ×3 IMPLANT
GOWN STRL REUS W/TWL LRG LVL3 (GOWN DISPOSABLE) ×9
IMPL BREAST GEL 535CC (Breast) ×2 IMPLANT
IMPLANT BREAST GEL 535CC (Breast) ×6 IMPLANT
IV NS 1000ML (IV SOLUTION)
IV NS 1000ML BAXH (IV SOLUTION) IMPLANT
IV NS 500ML (IV SOLUTION)
IV NS 500ML BAXH (IV SOLUTION) IMPLANT
KIT FILL SYSTEM UNIVERSAL (SET/KITS/TRAYS/PACK) IMPLANT
NDL SAFETY ECLIPSE 18X1.5 (NEEDLE) ×1 IMPLANT
NEEDLE HYPO 18GX1.5 SHARP (NEEDLE) ×3
NEEDLE HYPO 25X1 1.5 SAFETY (NEEDLE) ×3 IMPLANT
PACK BASIN DAY SURGERY FS (CUSTOM PROCEDURE TRAY) ×3 IMPLANT
PENCIL SMOKE EVACUATOR (MISCELLANEOUS) ×3 IMPLANT
PIN SAFETY STERILE (MISCELLANEOUS) IMPLANT
SIZER BREAST REUSE 535CC (SIZER) ×3
SIZER BRST REUSE ULT HI 535CC (SIZER) ×1 IMPLANT
SLEEVE SCD COMPRESS KNEE MED (MISCELLANEOUS) ×3 IMPLANT
SPONGE LAP 18X18 RF (DISPOSABLE) ×6 IMPLANT
STRIP SUTURE WOUND CLOSURE 1/2 (MISCELLANEOUS) ×6 IMPLANT
SUT MNCRL AB 4-0 PS2 18 (SUTURE) ×24 IMPLANT
SUT MON AB 3-0 SH 27 (SUTURE) ×12
SUT MON AB 3-0 SH27 (SUTURE) ×4 IMPLANT
SUT MON AB 5-0 PS2 18 (SUTURE) ×12 IMPLANT
SUT PDS AB 2-0 CT2 27 (SUTURE) IMPLANT
SUT VIC AB 3-0 SH 27 (SUTURE) ×12
SUT VIC AB 3-0 SH 27X BRD (SUTURE) ×4 IMPLANT
SUT VICRYL 4-0 PS2 18IN ABS (SUTURE) IMPLANT
SYR BULB IRRIG 60ML STRL (SYRINGE) ×3 IMPLANT
SYR CONTROL 10ML LL (SYRINGE) ×3 IMPLANT
TOWEL GREEN STERILE FF (TOWEL DISPOSABLE) ×6 IMPLANT
TRAY DSU PREP LF (CUSTOM PROCEDURE TRAY) ×3 IMPLANT
TUBE CONNECTING 20'X1/4 (TUBING) ×1
TUBE CONNECTING 20X1/4 (TUBING) ×2 IMPLANT
UNDERPAD 30X36 HEAVY ABSORB (UNDERPADS AND DIAPERS) ×6 IMPLANT
YANKAUER SUCT BULB TIP NO VENT (SUCTIONS) ×3 IMPLANT

## 2020-06-13 NOTE — Anesthesia Postprocedure Evaluation (Signed)
Anesthesia Post Note  Patient: Stacey Singh  Procedure(s) Performed: REMOVAL OF BILATERAL TISSUE EXPANDERS WITH PLACEMENT OF BILATERAL BREAST IMPLANTS (Bilateral Breast)     Patient location during evaluation: PACU Anesthesia Type: General Level of consciousness: awake and alert Pain management: pain level controlled Vital Signs Assessment: post-procedure vital signs reviewed and stable Respiratory status: spontaneous breathing, nonlabored ventilation, respiratory function stable and patient connected to nasal cannula oxygen Cardiovascular status: blood pressure returned to baseline and stable Postop Assessment: no apparent nausea or vomiting Anesthetic complications: no   No complications documented.  Last Vitals:  Vitals:   06/13/20 0945 06/13/20 1009  BP: 131/78 130/78  Pulse:    Resp:  18  Temp:  36.6 C  SpO2:  100%    Last Pain:  Vitals:   06/13/20 1009  TempSrc:   PainSc: 0-No pain                 Adylee Leonardo S

## 2020-06-13 NOTE — Discharge Instructions (Signed)
Post Anesthesia Home Care Instructions  Activity: Get plenty of rest for the remainder of the day. A responsible individual must stay with you for 24 hours following the procedure.  For the next 24 hours, DO NOT: -Drive a car -Paediatric nurse -Drink alcoholic beverages -Take any medication unless instructed by your physician -Make any legal decisions or sign important papers.  Meals: Start with liquid foods such as gelatin or soup. Progress to regular foods as tolerated. Avoid greasy, spicy, heavy foods. If nausea and/or vomiting occur, drink only clear liquids until the nausea and/or vomiting subsides. Call your physician if vomiting continues.  Special Instructions/Symptoms: Your throat may feel dry or sore from the anesthesia or the breathing tube placed in your throat during surgery. If this causes discomfort, gargle with warm salt water. The discomfort should disappear within 24 hours.    INSTRUCTIONS FOR AFTER SURGERY   You will likely have some questions about what to expect following your operation.  The following information will help you and your family understand what to expect when you are discharged from the hospital.  Following these guidelines will help ensure a smooth recovery and reduce risks of complications.  Postoperative instructions include information on: diet, wound care, medications and physical activity.  AFTER SURGERY Expect to go home after the procedure.  In some cases, you may need to spend one night in the hospital for observation.  DIET This surgery does not require a specific diet.  However, I have to mention that the healthier you eat the better your body can start healing. It is important to increasing your protein intake.  This means limiting the foods with added sugar.  Focus on fruits and vegetables and some meat. It is very important to drink water after your surgery.  If your urine is bright yellow, then it is concentrated, and you need to drink  more water.  As a general rule after surgery, you should have 8 ounces of water every hour while awake.  If you find you are persistently nauseated or unable to take in liquids let us know.  NO TOBACCO USE or EXPOSURE.  This will slow your healing process and increase the risk of a wound.  WOUND CARE If you don't have a drain: You can shower the day after surgery.  Use fragrance free soap.  Dial, [place], [place] and Cetaphil are usually mild on the skin.   If you have steri-strips / tape directly attached to your skin leave them in place. It is OK to get these wet.  No baths, pools or hot tubs for two weeks. We close your incision to leave the smallest and best-looking scar. No ointment or creams on your incisions until given the go ahead.  Especially not Neosporin (Too many skin reactions with this one).  A few weeks after surgery you can use Mederma and start massaging the scar. We ask you to wear your binder or sports bra for the first 6 weeks around the clock, including while sleeping. This provides added comfort and helps reduce the fluid accumulation at the surgery site.  ACTIVITY No heavy lifting until cleared by the doctor.  It is OK to walk and climb stairs. In fact, moving your legs is very important to decrease your risk of a blood clot.  It will also help keep you from getting deconditioned.  Every 1 to 2 hours get up and walk for 5 minutes. This will help with a quicker recovery back to normal.  Let  pain be your guide so you don't do too much.  NO, you cannot do the spring cleaning and don't plan on taking care of anyone else.  This is your time for TLC.   WORK Everyone returns to work at different times. As a rough guide, most people take at least 1 - 2 weeks off prior to returning to work. If you need documentation for your job, bring the forms to your postoperative follow up visit.  DRIVING Arrange for someone to bring you home from the hospital.  You may be able to drive a few days after  surgery but not while taking any narcotics or valium.  BOWEL MOVEMENTS Constipation can occur after anesthesia and while taking pain medication.  It is important to stay ahead for your comfort.  We recommend taking Milk of Magnesia (2 tablespoons; twice a day) while taking the pain pills.  SEROMA This is fluid your body tried to put in the surgical site.  This is normal but if it creates excessive pain and swelling let us know.  It usually decreases in a few weeks.  MEDICATIONS and PAIN CONTROL At your preoperative visit for you history and physical you were given the following medications: 1. An antibiotic: Start this medication when you get home and take according to the instructions on the bottle. 2. Zofran 4 mg:  This is to treat nausea and vomiting.  You can take this every 6 hours as needed and only if needed. 3. Norco (hydrocodone/acetaminophen) 5/325 mg:  This is only to be used after you have taken the motrin or the tylenol. Every 8 hours as needed. Over the counter Medication to take: 4. Ibuprofen (Motrin) 600 mg:  Take this every 6 hours.  If you have additional pain then take 500 mg of the tylenol.  Only take the Norco after you have tried these two. 5. Miralax or stool softener of choice: Take this according to the bottle if you take the [place] Call your surgeon's office if any of the following occur: . Fever 101 degrees F or greater . Excessive bleeding or fluid from the incision site. . Pain that increases over time without aid from the medications . Redness, warmth, or pus draining from incision sites . Persistent nausea or inability to take in liquids . Severe misshapen area that underwent the operation.   Post Anesthesia Home Care Instructions  Activity: Get plenty of rest for the remainder of the day. A responsible individual must stay with you for 24 hours following the procedure.  For the next 24 hours, DO NOT: -Drive a car -Conservation officer, nature -Drink alcoholic beverages -Take any medication unless instructed by your physician -Make any legal decisions or sign important papers.  Meals: Start with liquid foods such as gelatin or soup. Progress to regular foods as tolerated. Avoid greasy, spicy, heavy foods. If nausea and/or vomiting occur, drink only clear liquids until the nausea and/or vomiting subsides. Call your physician if vomiting continues.  Special Instructions/Symptoms: Your throat may feel dry or sore from the anesthesia or the breathing tube placed in your throat during surgery. If this causes discomfort, gargle with warm salt water. The discomfort should disappear within 24 hours.  If you had a scopolamine patch placed behind your ear for the management of post- operative nausea and/or vomiting:  1. The medication in the patch is effective for 72 hours, after which it should be removed.  Wrap patch in a tissue and discard in  the trash. Wash hands thoroughly with soap and water. 2. You may remove the patch earlier than 72 hours if you experience unpleasant side effects which may include dry mouth, dizziness or visual disturbances. 3. Avoid touching the patch. Wash your hands with soap and water after contact with the patch.

## 2020-06-13 NOTE — Interval H&P Note (Signed)
History and Physical Interval Note:  06/13/2020 6:54 AM  Stacey Singh  has presented today for surgery, with the diagnosis of history of breast cancer, post bilateral mastectomy.  The various methods of treatment have been discussed with the patient and family. After consideration of risks, benefits and other options for treatment, the patient has consented to  Procedure(s): REMOVAL OF BILATERAL TISSUE EXPANDERS WITH PLACEMENT OF BILATERAL BREAST IMPLANTS (Bilateral) as a surgical intervention.  The patient's history has been reviewed, patient examined, no change in status, stable for surgery.  I have reviewed the patient's chart and labs.  Questions were answered to the patient's satisfaction.     Stacey Singh

## 2020-06-13 NOTE — Op Note (Signed)
Op report Bilateral Exchange   DATE OF OPERATION: 06/13/2020  LOCATION: [place]  SURGICAL DIVISION: Plastic Surgery  PREOPERATIVE DIAGNOSIS:  1.History of breast cancer.  2. Acquired absence of bilateral breast.   POSTOPERATIVE DIAGNOSIS:  1. History of breast cancer.  2. Acquired absence of bilateral breast.   PROCEDURE:  1. Bilateral exchange of tissue expanders for implants.  2. Bilateral capsulotomies for implant respositioning.  SURGEON: Georgio Hattabaugh Sanger Anchor Dwan, DO  ASSISTANT: Roetta Sessions, PA  ANESTHESIA:  General.   COMPLICATIONS: None.   IMPLANTS: Left - Mentor Smooth Round Ultra High Profile Gel 535cc. Ref #[id number].  Serial Number [id number] Right - Mentor Smooth Round Ultra High Profile Gel 535cc. Ref #[id number].  Serial Number [id number]  INDICATIONS FOR PROCEDURE:  The patient, Stacey Singh, is a 44 y.o. female born on [birth year], is here for treatment after bilateral mastectomies.  She had tissue expanders placed at the time of mastectomies. She now presents for exchange of her expanders for implants.  She requires capsulotomies to better position the implants. MRN: [id number]  CONSENT:  Informed consent was obtained directly from the patient. Risks, benefits and alternatives were fully discussed. Specific risks including but not limited to bleeding, infection, hematoma, seroma, scarring, pain, implant infection, implant extrusion, capsular contracture, asymmetry, wound healing problems, and need for further surgery were all discussed. The patient did have an ample opportunity to have her questions answered to her satisfaction.   DESCRIPTION OF PROCEDURE:  The patient was taken to the operating room. SCDs were placed and IV antibiotics were given. The patient's chest was prepped and draped in a sterile fashion. A time out was performed and the implants to be used were identified.    On the right breast: Local with epinephrine  was used to infiltrate at the incision site. The old mastectomy scar was excised.  The mastectomy flaps from the superior and inferior flaps were raised over the pectoralis major muscle for several centimeters to minimize tension for the closure. The pectoralis was split inferior to the skin incision to expose and remove the tissue expander.  Inspection of the pocket showed a normal healthy capsule and good integration of the biologic matrix.  The pocket was irrigated with antibiotic solution.  The lateral portion of the capsule was excised and repositioned more medially with 2-0 PDS.  Attention was given to the medial aspect to excess a significant amount to improve the contour.  Circumferential capsulotomies were performed to allow for breast pocket expansion.  Measurements were made and a sizer used to confirm adequate pocket size for the implant dimensions.  Hemostasis was ensured with electrocautery. New gloves were placed. The implant was soaked in antibiotic solution and then placed in the pocket and oriented appropriately. The pectoralis major muscle and capsule on the anterior surface were re-closed with a 3-0 Monocryl suture. The remaining skin was closed with 4-0 Monocryl deep dermal and 5-0 Monocryl subcuticular stitches.   On the left breast: The old mastectomy scar was excised.  The mastectomy flaps from the superior and inferior flaps were raised over the pectoralis major muscle for several centimeters to minimize tension for the closure. The pectoralis was split inferior to the skin incision to expose and remove the tissue expander.  Inspection of the pocket showed a normal healthy capsule and good integration of the biologic matrix.  The lateral portion of the capsule was excised and repositioned more medially with 2-0 PDS.  Attention was given to the medial aspect to  excess a significant amount to improve the contour.   Circumferential capsulotomies were performed to allow for breast pocket  expansion.  Measurements were made and a sizer utilized to confirm adequate pocket size for the implant dimensions.  Hemostasis was ensured with the electrocautery.  New gloves were applied. The implant was soaked in antibiotic solution and placed in the pocket and oriented appropriately. The pectoralis major muscle and capsule on the anterior surface were re-closed with a 3-0 Monocryl suture. The remaining skin was closed with 4-0 Monocryl deep dermal and 5-0 Monocryl subcuticular stitches.  Dermabond was applied to the incision site. A breast binder and ABDs were placed.  The patient was allowed to wake from anesthesia and taken to the recovery room in satisfactory condition.   The advanced practice practitioner (APP) assisted throughout the case.  The APP was essential in retraction and counter traction when needed to make the case progress smoothly.  This retraction and assistance made it possible to see the tissue plans for the procedure.  The assistance was needed for blood control, tissue re-approximation and assisted with closure of the incision site.

## 2020-06-13 NOTE — Transfer of Care (Signed)
Immediate Anesthesia Transfer of Care Note  Patient: Stacey Singh  Procedure(s) Performed: REMOVAL OF BILATERAL TISSUE EXPANDERS WITH PLACEMENT OF BILATERAL BREAST IMPLANTS (Bilateral Breast)  Patient Location: PACU  Anesthesia Type:General  Level of Consciousness: awake, alert , oriented and drowsy  Airway & Oxygen Therapy: Patient Spontanous Breathing and Patient connected to nasal cannula oxygen  Post-op Assessment: Report given to RN and Post -op Vital signs reviewed and stable  Post vital signs: Reviewed and stable  Last Vitals:  Vitals Value Taken Time  BP    Temp    Pulse    Resp    SpO2      Last Pain:  Vitals:   06/13/20 0637  TempSrc: Oral  PainSc: 0-No pain      Patients Stated Pain Goal: 4 (61/47/09 2957)  Complications: No complications documented.

## 2020-06-13 NOTE — Anesthesia Preprocedure Evaluation (Signed)
Anesthesia Evaluation  Patient identified by MRN, date of birth, ID band Patient awake    Reviewed: Allergy & Precautions, H&P , NPO status , Patient's Chart, lab work & pertinent test results  Airway Mallampati: II   Neck ROM: full    Dental   Pulmonary former smoker,    breath sounds clear to auscultation       Cardiovascular hypertension,  Rhythm:regular Rate:Normal     Neuro/Psych    GI/Hepatic   Endo/Other  obese  Renal/GU      Musculoskeletal   Abdominal   Peds  Hematology   Anesthesia Other Findings   Reproductive/Obstetrics H/o breast CA                             Anesthesia Physical Anesthesia Plan  ASA: II  Anesthesia Plan: General   Post-op Pain Management:    Induction: Intravenous  PONV Risk Score and Plan: 3 and Ondansetron, Dexamethasone, Midazolam and Treatment may vary due to age or medical condition  Airway Management Planned: Oral ETT  Additional Equipment:   Intra-op Plan:   Post-operative Plan: Extubation in OR  Informed Consent: I have reviewed the patients History and Physical, chart, labs and discussed the procedure including the risks, benefits and alternatives for the proposed anesthesia with the patient or authorized representative who has indicated his/her understanding and acceptance.       Plan Discussed with: CRNA, Anesthesiologist and Surgeon  Anesthesia Plan Comments:         Anesthesia Quick Evaluation

## 2020-06-13 NOTE — Anesthesia Procedure Notes (Signed)
Procedure Name: LMA Insertion Date/Time: 06/13/2020 7:33 AM Performed by: Willa Frater, CRNA Pre-anesthesia Checklist: Patient identified, Emergency Drugs available, Suction available and Patient being monitored Patient Re-evaluated:Patient Re-evaluated prior to induction Oxygen Delivery Method: Circle system utilized Preoxygenation: Pre-oxygenation with 100% oxygen Induction Type: IV induction Ventilation: Mask ventilation without difficulty LMA: LMA inserted LMA Size: 4.0 Number of attempts: 1 Airway Equipment and Method: Bite block Placement Confirmation: positive ETCO2 Tube secured with: Tape Dental Injury: Teeth and Oropharynx as per pre-operative assessment

## 2020-06-14 ENCOUNTER — Encounter (HOSPITAL_BASED_OUTPATIENT_CLINIC_OR_DEPARTMENT_OTHER): Payer: Self-pay | Admitting: Plastic Surgery

## 2020-06-14 LAB — SURGICAL PATHOLOGY

## 2020-06-18 ENCOUNTER — Ambulatory Visit (INDEPENDENT_AMBULATORY_CARE_PROVIDER_SITE_OTHER): Payer: BC Managed Care – PPO | Admitting: Plastic Surgery

## 2020-06-18 ENCOUNTER — Encounter (HOSPITAL_BASED_OUTPATIENT_CLINIC_OR_DEPARTMENT_OTHER): Payer: Self-pay | Admitting: Plastic Surgery

## 2020-06-18 ENCOUNTER — Other Ambulatory Visit: Payer: Self-pay

## 2020-06-18 VITALS — BP 138/88 | HR 82 | Temp 96.9°F

## 2020-06-18 DIAGNOSIS — Z9013 Acquired absence of bilateral breasts and nipples: Secondary | ICD-10-CM

## 2020-06-18 NOTE — Progress Notes (Signed)
The patient is a 44 year old female here for follow after undergoing exchange of her expanders on November 18.  Round ultra high-profile Mentor 535 cc expanders placed on both sides.  Her incisions are healing nicely.  She has a little bit of bruising as expected.  She also has some soreness on the lateral part.  I explained this was from the capsule being medialized for improved positioning of the implant.  I would like to see her back in 2 weeks.  We talked about the possibility of fat grafting in the future.  Overall she has done a remarkably good job.

## 2020-06-26 ENCOUNTER — Other Ambulatory Visit: Payer: Self-pay

## 2020-06-26 ENCOUNTER — Encounter: Payer: Self-pay | Admitting: Adult Health

## 2020-06-26 ENCOUNTER — Inpatient Hospital Stay: Payer: BC Managed Care – PPO | Attending: Adult Health | Admitting: Adult Health

## 2020-06-26 VITALS — BP 120/71 | HR 87 | Temp 97.9°F | Resp 18 | Ht 63.0 in | Wt 229.5 lb

## 2020-06-26 DIAGNOSIS — Z87891 Personal history of nicotine dependence: Secondary | ICD-10-CM | POA: Insufficient documentation

## 2020-06-26 DIAGNOSIS — Z806 Family history of leukemia: Secondary | ICD-10-CM | POA: Diagnosis not present

## 2020-06-26 DIAGNOSIS — Z86000 Personal history of in-situ neoplasm of breast: Secondary | ICD-10-CM | POA: Insufficient documentation

## 2020-06-26 DIAGNOSIS — Z9013 Acquired absence of bilateral breasts and nipples: Secondary | ICD-10-CM | POA: Diagnosis not present

## 2020-06-26 DIAGNOSIS — D0511 Intraductal carcinoma in situ of right breast: Secondary | ICD-10-CM | POA: Diagnosis not present

## 2020-06-26 DIAGNOSIS — Z79899 Other long term (current) drug therapy: Secondary | ICD-10-CM | POA: Insufficient documentation

## 2020-06-26 DIAGNOSIS — Z803 Family history of malignant neoplasm of breast: Secondary | ICD-10-CM | POA: Insufficient documentation

## 2020-06-26 NOTE — Progress Notes (Signed)
SURVIVORSHIP  VISIT:   BRIEF ONCOLOGIC HISTORY:  Oncology History  Ductal carcinoma in situ (DCIS) of right breast  12/15/2019 Cancer Staging   Staging form: Breast, AJCC 8th Edition - Clinical stage from 12/15/2019: Stage 0 (cTis (DCIS), cN0, cM0, ER+, PR+)   12/15/2019 Initial Diagnosis   Screening mammogram detected new loosely grouped pleomorphic calcifications throughout the right breast.  2 biopsies performed.  UIQ: Intermediate grade DCIS with necrosis, UOQ: Intermediate grade DCIS with necrosis, both are ER 100%, PR 100%   01/05/2020 Genetic Testing   Negative genetic testing:  No pathogenic variants detected on the [facility] Breast Cancer STAT panel. The report date is 01/05/2020.  The Breast Cancer STAT Panel offered by [facility] includes sequencing and deletion/duplication analysis for the following 9 genes:  ATM, BRCA1, BRCA2, CDH1, CHEK2, PALB2, PTEN, STK11 and TP53.   03/13/2020 Surgery   Bilateral mastectomies [facility]) [id number]): Left breast: no evidence of malignancy Right breast: DCIS with necrosis and calcifications, two right axillary lymph nodes negative for carcinoma.   06/02/2020 Cancer Staging   Staging form: Breast, AJCC 8th Edition - Pathologic: Stage 0 (pTis (DCIS), pN0, cM0, ER+, PR+)     INTERVAL HISTORY:  Stacey Singh to review her survivorship care plan detailing her treatment course for breast cancer, as well as monitoring long-term side effects of that treatment, education regarding health maintenance, screening, and overall wellness and health promotion.     Overall, Stacey Singh reports feeling quite well.  She underwent breast implant placement before thanksgiving and it is healing well.  She has f/u with Dr. Marla Singh soon and will see Dr. Marlou Starks in 2 weeks.    REVIEW OF SYSTEMS:  Review of Systems  Constitutional: Negative for appetite change, chills, fatigue, fever and unexpected weight change.  HENT:   Negative for hearing loss, lump/mass  and trouble swallowing.   Eyes: Negative for eye problems and icterus.  Respiratory: Negative for chest tightness, cough and shortness of breath.   Cardiovascular: Negative for chest pain, leg swelling and palpitations.  Gastrointestinal: Negative for abdominal distention, abdominal pain, constipation, diarrhea, nausea and vomiting.  Endocrine: Negative for hot flashes.  Genitourinary: Negative for difficulty urinating.   Musculoskeletal: Negative for arthralgias.  Skin: Negative for itching and rash.  Neurological: Negative for dizziness, extremity weakness, headaches and numbness.  Hematological: Negative for adenopathy. Does not bruise/bleed easily.  Psychiatric/Behavioral: Negative for depression. The patient is not nervous/anxious.   Breast: Denies any new nodularity, masses, tenderness, nipple changes, or nipple discharge.      ONCOLOGY TREATMENT TEAM:  1. Surgeon:  Dr. Marlou [facility]arks at [facility] 2. Medical Oncologist: Dr. Lindi Adie  3. Plastic surgeon: Dr. Marla Singh    PAST MEDICAL/SURGICAL HISTORY:  Past Medical History:  Diagnosis Date  . Allergy   . Family history of breast cancer   . Family history of leukemia   . Heart murmur    in high school, resolved  . Heartburn in pregnancy   . Hypertension    with current pregnancy  . Hypertension   . No pertinent past medical history    Past Surgical History:  Procedure Laterality Date  . BREAST RECONSTRUCTION WITH PLACEMENT OF TISSUE EXPANDER AND FLEX HD (ACELLULAR HYDRATED DERMIS) Bilateral 03/13/2020   Procedure: BILATERAL BREAST RECONSTRUCTION WITH PLACEMENT OF TISSUE EXPANDERS AND FLEX HD (ACELLULAR HYDRATED DERMIS);  Surgeon: Wallace Going, DO;  Location: [place];  Service: Plastics;  Laterality: Bilateral;  . CESAREAN SECTION  04/04/2012   Procedure:  CESAREAN SECTION;  Surgeon: Farrel Gobble. Harrington Challenger, MD;  Location: [facility];  Service: Obstetrics;  Laterality: N/A;  . CESAREAN SECTION N/A 05/19/2015    Procedure: CESAREAN SECTION;  Surgeon: Allyn Kenner, DO;  Location: [facility];  Service: Obstetrics;  Laterality: N/A;  . CESAREAN SECTION    . DILATION AND CURETTAGE OF UTERUS    . MASTECTOMY W/ SENTINEL NODE BIOPSY Bilateral 03/13/2020   Procedure: BILATERAL MASTECTOMY WITH RIGHT SENTINEL LYMPH NODE BIOPSY;  Surgeon: Jovita Kussmaul, MD;  Location: [facility];  Service: General;  Laterality: Bilateral;  PEC BLOCK  . REMOVAL OF BILATERAL TISSUE EXPANDERS WITH PLACEMENT OF BILATERAL BREAST IMPLANTS Bilateral 06/13/2020   Procedure: REMOVAL OF BILATERAL TISSUE EXPANDERS WITH PLACEMENT OF BILATERAL BREAST IMPLANTS;  Surgeon: Wallace Going, DO;  Location: [facility];  Service: Plastics;  Laterality: Bilateral;  . WISDOM TOOTH EXTRACTION       ALLERGIES:  Allergies  Allergen Reactions  . Penicillins   . Penicillins     Childhood reaction     CURRENT MEDICATIONS:  Outpatient Encounter Medications as of 06/26/2020  Medication Sig  . ciprofloxacin (CIPRO) 500 MG tablet Take 1 tablet (500 mg total) by mouth 2 (two) times daily.  . diazepam (VALIUM) 2 MG tablet Take 1 tablet (2 mg total) by mouth every 12 (twelve) hours as needed for muscle spasms.  Marland Kitchen olmesartan (BENICAR) 40 MG tablet Take 1 tablet (40 mg total) by mouth daily.  Marland Kitchen triamcinolone (KENALOG) 0.025 % ointment Apply 1 application topically 2 (two) times daily.   No facility-administered encounter medications on file as of 06/26/2020.     ONCOLOGIC FAMILY HISTORY:  Family History  Problem Relation Age of Onset  . Hypertension Mother   . Breast cancer Mother 14       negative genetic testing  . Heart disease Father   . Hypertension Sister   . Hypertension Brother   . Diabetes Brother   . Breast cancer Maternal Grandmother        dx. in her 2s  . Heart disease Maternal Grandfather   . Heart Problems Paternal Grandmother   . Heart Problems Paternal Grandfather   . Breast cancer  Paternal Aunt        dx. in her 77s  . Breast cancer Other        dx. in her 44s or 55s (MGM's sister)  . Kidney disease Maternal Uncle   . Leukemia Paternal Aunt 16     GENETIC COUNSELING/TESTING: See above  SOCIAL HISTORY:  Social History   Socioeconomic History  . Marital status: Married    Spouse name: Not on file  . Number of children: Not on file  . Years of education: Not on file  . Highest education level: Not on file  Occupational History  . Not on file  Tobacco Use  . Smoking status: Former Smoker    Quit date: 06/06/2009    Years since quitting: 11.0  . Smokeless tobacco: Never Used  Substance and Sexual Activity  . Alcohol use: Not Currently  . Drug use: No  . Sexual activity: Not on file  Other Topics Concern  . Not on file  Social History Narrative   ** Merged History Encounter **       Social Determinants of Health   Financial Resource Strain:   . Difficulty of Paying Living Expenses: Not on file  Food Insecurity:   . Worried About Charity fundraiser in the Last  Year: Not on file  . Ran Out of Food in the Last Year: Not on file  Transportation Needs:   . Lack of Transportation (Medical): Not on file  . Lack of Transportation (Non-Medical): Not on file  Physical Activity:   . Days of Exercise per Week: Not on file  . Minutes of Exercise per Session: Not on file  Stress:   . Feeling of Stress : Not on file  Social Connections:   . Frequency of Communication with Friends and Family: Not on file  . Frequency of Social Gatherings with Friends and Family: Not on file  . Attends Religious Services: Not on file  . Active Member of Clubs or Organizations: Not on file  . Attends Archivist Meetings: Not on file  . Marital Status: Not on file  Intimate Partner Violence:   . Fear of Current or Ex-Partner: Not on file  . Emotionally Abused: Not on file  . Physically Abused: Not on file  . Sexually Abused: Not on file      OBSERVATIONS/OBJECTIVE:  BP 120/71 (BP Location: Right Arm, Patient Position: Sitting)   Pulse 87   Temp 97.9 F (36.6 C) (Tympanic)   Resp 18   Ht 5' 3"  (1.6 m)   Wt 229 lb 8 oz (104.1 kg)   SpO2 100%   BMI 40.65 kg/m  GENERAL: Patient is a well appearing female in no acute distress HEENT:  Sclerae anicteric.  Mask in place.  Neck is supple.  NODES:  No cervical, supraclavicular, or axillary lymphadenopathy palpated.  BREAST EXAM:  S/p bilateral mastectomies and implant placement, no sign of local recurrence, healing well.  LUNGS:  Clear to auscultation bilaterally.  No wheezes or rhonchi. HEART:  Regular rate and rhythm. No murmur appreciated. ABDOMEN:  Soft, nontender.  Positive, normoactive bowel sounds. No organomegaly palpated. MSK:  No focal spinal tenderness to palpation. Full range of motion bilaterally in the upper extremities. EXTREMITIES:  No peripheral edema.   SKIN:  Clear with no obvious rashes or skin changes. No nail dyscrasia. NEURO:  Nonfocal. Well oriented.  Appropriate affect.    LABORATORY DATA:  None for this visit.  DIAGNOSTIC IMAGING:  None for this visit.      ASSESSMENT AND PLAN:  Ms.. Singh is a pleasant 44 y.o. female with Stage 0 right breast DCIS, ER+/PR+, diagnosed in 11/2019, treated with bilateral mastectomies.  She presents to the Survivorship Clinic for our initial meeting and routine follow-up post-completion of treatment for breast cancer.    1. Stage 0 right breast cancer:  Stacey Singh is continuing to recover from definitive treatment for breast cancer. She is not receiving any treatment for risk reduction of recurrence as she underwent bilateral mastectomies for her DCIS which are curative.  I do recommend a breast exam every 6 months.  She will do one of these exams with her GYN in May.  She sees Dr. Marlou Starks in two weeks and will let us know if she would like to see him annually in November, or [place] annually in November.  Whichever  she prefers we are happy to accommodate.  Breast exam is the way to fully evaluate for a new breast cancer, which the risk is quite minimal.  She will f/u with Dr. Marla Singh regarding whether any breast imaging is needed to monitor her implants.    Today, a comprehensive survivorship care plan and treatment summary was reviewed with the patient today detailing her breast cancer diagnosis, treatment course, potential  late/long-term effects of treatment, appropriate follow-up care with recommendations for the future, and patient education resources.  A copy of this summary, along with a letter will be sent to the patient's primary care provider via mail/fax/In Basket message after today's visit.    2.Bone health:   She was given education on specific activities to promote bone health.  3. Cancer screening:  Due to Stacey Singh's history and her age, she should receive screening for skin cancers, colon cancer, and gynecologic cancers.  The information and recommendations are listed on the patient's comprehensive care plan/treatment summary and were reviewed in detail with the patient.    4. Health maintenance and wellness promotion: Stacey Singh was encouraged to consume 5-7 servings of fruits and vegetables per day. We reviewed the "Nutrition Rainbow" handout, as well as the handout "Take Control of Your Health and Reduce Your Cancer Risk" from the [employer].  She was also encouraged to engage in moderate to vigorous exercise for 30 minutes per day most days of the week. We discussed the [employer] fitness program, which is designed for cancer survivors to help them become more physically fit after cancer treatments.  She was instructed to limit her alcohol consumption and continue to abstain from tobacco use.     5. Support services/counseling: It is not uncommon for this period of the patient's cancer care trajectory to be one of many emotions and stressors.  We discussed how this can be  increasingly difficult during the times of quarantine and social distancing due to the COVID-19 pandemic.   She was given information regarding our available services and encouraged to contact me with any questions or for help enrolling in any of our support group/programs.    Follow up instructions:    -Return to cancer center PRN  -Follow up with surgery in 2 weeks -to see GYN every May -She is welcome to return back to the Survivorship Clinic at any time; no additional follow-up needed at this time.  -Consider referral back to survivorship as a long-term survivor for continued surveillance  The patient was provided an opportunity to ask questions and all were answered. The patient agreed with the plan and demonstrated an understanding of the instructions.   Total encounter time: 30 minutes*  Wilber Bihari, NP 06/26/20 6:47 AM Medical Oncology and Hematology [facility] [place], [place] [address] Tel. [phone]    Fax. [phone]  *Total Encounter Time as defined by the Centers for Medicare and Medicaid Services includes, in addition to the face-to-face time of a patient visit (documented in the note above) non-face-to-face time: obtaining and reviewing outside history, ordering and reviewing medications, tests or procedures, care coordination (communications with other health care professionals or caregivers) and documentation in the medical record.

## 2020-06-27 ENCOUNTER — Telehealth: Payer: Self-pay | Admitting: Adult Health

## 2020-06-27 NOTE — Telephone Encounter (Signed)
No 12/1 los. No changes made to pt's schedule.

## 2020-07-02 ENCOUNTER — Ambulatory Visit (INDEPENDENT_AMBULATORY_CARE_PROVIDER_SITE_OTHER): Payer: BC Managed Care – PPO | Admitting: Surgical

## 2020-07-02 ENCOUNTER — Other Ambulatory Visit: Payer: Self-pay

## 2020-07-02 ENCOUNTER — Encounter: Payer: Self-pay | Admitting: Surgical

## 2020-07-02 VITALS — BP 108/77 | HR 86 | Temp 98.6°F

## 2020-07-02 DIAGNOSIS — Z9013 Acquired absence of bilateral breasts and nipples: Secondary | ICD-10-CM

## 2020-07-02 NOTE — Progress Notes (Signed)
Patient is a 44 year old female here for follow-up after removal of bilateral tissue expanders and placement of bilateral breast implants with Dr. Marla Roe on 06/13/2020.  Patient had Mentor smooth round ultra high profile gel 535 cc implants placed bilaterally.  Patient reports she is overall doing well, reports some right lateral breast pain.  She otherwise has no complaints.  She reports that she has not needed to take any pain medications.  Chaperone present on exam Bilateral breast incisions intact, honeycomb dressing and Steri-Strips in place.  No breast erythema or cellulitic changes noted.  No incisional dehiscence noted.  After removing of the honeycomb dressing, she did have a little bit of irritation on the right inferior mastectomy flap.  No fluid wave noted with palpation.  No bruising or necrosis noted.  Recommend avoiding strenuous activity, avoid heavy lifting greater than 15 pounds. Continue to wear compression garment 24/7. We discussed future surgical options including fat grafting to bilateral breasts for improved contour and symmetry. We discussed massaging the right breast superior pole to help the implant become more inferior and ptotic similar to the left side. I recommend she call with any questions or concerns, would like to follow-up with patient in 2 to 3 weeks.

## 2020-07-04 ENCOUNTER — Encounter: Payer: Self-pay | Admitting: Rehabilitation

## 2020-07-04 ENCOUNTER — Other Ambulatory Visit: Payer: Self-pay

## 2020-07-04 ENCOUNTER — Ambulatory Visit: Payer: BC Managed Care – PPO | Attending: Plastic Surgery | Admitting: Rehabilitation

## 2020-07-04 DIAGNOSIS — Z483 Aftercare following surgery for neoplasm: Secondary | ICD-10-CM | POA: Diagnosis present

## 2020-07-04 DIAGNOSIS — M25612 Stiffness of left shoulder, not elsewhere classified: Secondary | ICD-10-CM | POA: Insufficient documentation

## 2020-07-04 DIAGNOSIS — M6281 Muscle weakness (generalized): Secondary | ICD-10-CM | POA: Diagnosis present

## 2020-07-04 DIAGNOSIS — M25611 Stiffness of right shoulder, not elsewhere classified: Secondary | ICD-10-CM | POA: Diagnosis present

## 2020-07-04 DIAGNOSIS — R293 Abnormal posture: Secondary | ICD-10-CM | POA: Insufficient documentation

## 2020-07-04 NOTE — Therapy (Signed)
[place], [place], [address] Phone: [phone]   Fax:  [phone]  Physical Therapy Treatment  Patient Details  Name: Stacey Singh MRN: [id number] Date of Birth: 07/15/1976 Referring Provider (PT): Dr. Marla Roe    Encounter Date: 07/04/2020   PT End of Session - 07/04/20 1048    Visit Number 11    Number of Visits 25    Date for PT Re-Evaluation 07/25/20    PT Start Time 0906    PT Stop Time 0956    PT Time Calculation (min) 50 min    Activity Tolerance Patient tolerated treatment well    Behavior During Therapy [facility] for tasks assessed/performed           Past Medical History:  Diagnosis Date  . Allergy   . Family history of breast cancer   . Family history of leukemia   . Heart murmur    in high school, resolved  . Heartburn in pregnancy   . Hypertension    with current pregnancy  . Hypertension   . No pertinent past medical history     Past Surgical History:  Procedure Laterality Date  . BREAST RECONSTRUCTION WITH PLACEMENT OF TISSUE EXPANDER AND FLEX HD (ACELLULAR HYDRATED DERMIS) Bilateral 03/13/2020   Procedure: BILATERAL BREAST RECONSTRUCTION WITH PLACEMENT OF TISSUE EXPANDERS AND FLEX HD (ACELLULAR HYDRATED DERMIS);  Surgeon: Wallace Going, DO;  Location: [place];  Service: Plastics;  Laterality: Bilateral;  . CESAREAN SECTION  04/04/2012   Procedure: CESAREAN SECTION;  Surgeon: Farrel Gobble. Harrington Challenger, MD;  Location: [place] [facility];  Service: Obstetrics;  Laterality: N/A;  . CESAREAN SECTION N/A 05/19/2015   Procedure: CESAREAN SECTION;  Surgeon: Allyn Kenner, DO;  Location: [place] [facility];  Service: Obstetrics;  Laterality: N/A;  . CESAREAN SECTION    . DILATION AND CURETTAGE OF UTERUS    . MASTECTOMY W/ SENTINEL NODE BIOPSY Bilateral 03/13/2020   Procedure: BILATERAL MASTECTOMY WITH RIGHT SENTINEL LYMPH NODE BIOPSY;  Surgeon: Jovita Kussmaul, MD;  Location: [place];  Service: General;  Laterality: Bilateral;  PEC BLOCK  . REMOVAL OF BILATERAL TISSUE EXPANDERS WITH PLACEMENT OF BILATERAL BREAST IMPLANTS Bilateral 06/13/2020   Procedure: REMOVAL OF BILATERAL TISSUE EXPANDERS WITH PLACEMENT OF BILATERAL BREAST IMPLANTS;  Surgeon: Wallace Going, DO;  Location: [place];  Service: Plastics;  Laterality: Bilateral;  . WISDOM TOOTH EXTRACTION      There were no vitals filed for this visit.   Subjective Assessment - 07/04/20 0907    Subjective Had the implants switched on 06/13/20 by Dr. Marla Roe.  They took the honeycomb bandage off and have the steristrips on.  They said I was ok to do PT but not much resistance. I am just stiff.  I've been able to stop wearing my chip pack and it seems a bit better.  My arms don't feel as tight.    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  She has bilateral mastecomy on 03/13/2020 with immediate expanders Plans to have implants on June 13, 2020    Currently in Pain? No/denies              [facility] PT Assessment - 07/04/20 0001      Assessment   Medical Diagnosis right breast cancer     Referring Provider (PT) Dr. Marla Roe     Onset Date/Surgical Date 03/13/20    Hand Dominance  Right      Observation/Other Assessments   Observations Pt with new incisions covered with steristrips across bilaterally breasts.  The Rt implant sits higher appearing puffy/larger on that side.  Overall less tightness evident across the chest and less extra dog ear skin at the medial breast.      AROM   Right Shoulder Extension 70 Degrees    Right Shoulder Flexion 165 Degrees    Right Shoulder ABduction 155 Degrees   pull in the lateral chest   Right Shoulder Internal Rotation 50 Degrees    Right Shoulder External Rotation 80 Degrees    Left Shoulder Extension 70 Degrees    Left Shoulder Flexion 162 Degrees    Left Shoulder ABduction 164 Degrees    Left  Shoulder Internal Rotation 70 Degrees    Left Shoulder External Rotation 90 Degrees      Palpation   Palpation comment Rt implant higher and easier to feel borders                         [facility] Adult PT Treatment/Exercise - 07/04/20 0001      Manual Therapy   Manual therapy comments pt was instructed to use inferior pressure on the Rt implant per surgeon    Manual Lymphatic Drainage (MLD) short neck, bil axillary and Inguinal nodes, MLD done to bilateral lateral trunk/breast using axillo inguinal pathways in supine and SL    Passive ROM PROM bilateral Shoulder flex and abd                    PT Short Term Goals - 05/30/20 1433      PT SHORT TERM GOAL #1   Time 4    Period Weeks    Status Partially Met             PT Long Term Goals - 07/04/20 1051      PT LONG TERM GOAL #1   Title Pt will have > 150 degrees of right and left shoulder abduction so that she can return to her normal activities    Status Achieved      PT LONG TERM GOAL #2   Title Pt will be inpependent in a HEP  for shoulder ROM and strength    Status Achieved      PT LONG TERM GOAL #3   Title Able to fully brush the back of her hair without limitation    Status Achieved      PT LONG TERM GOAL #4   Title Able to open jars without difficulty    Status Achieved      PT LONG TERM GOAL #5   Title Quick dash no greater than 10%    Status On-going      PT LONG TERM GOAL #6   Title Able to perorm personal hygiene with right UE with improved ease    Status Achieved      PT LONG TERM GOAL #7   Title Able to turn steering wheel with right upper extremity with improved ease when driving with 1 hand    Status Achieved                 Plan - 07/04/20 1049    Clinical Impression Statement Pt has still not received her sleeve so was instructed to call to see if they do have it but have not called her yet.. Pt returns post implant switch feeling well overall with  less  tightness, less edema in the lateral chest, and general post op edema present.  Pt has decreased Rt shoulder abduction compared to switch by 10 degrees but all other motions remain the same.  Pt will return for post op MLD of bilateral Lateral chest and anterior Rt chest to see if beneficial.    PT Frequency 2x / week    PT Duration 8 weeks    PT Treatment/Interventions ADLs/Self Care Home Management;DME Instruction;Therapeutic activities;Therapeutic exercise;Neuromuscular re-education;Patient/family education;Manual techniques;Manual lymph drainage;Passive range of motion;Scar mobilization    PT Next Visit Plan bil lateral chest and anterior breast MLD for post op, educated pt on strength ABC before final visit for pt to self progress    Consulted and Agree with Plan of Care Patient           Patient will benefit from skilled therapeutic intervention in order to improve the following deficits and impairments:     Visit Diagnosis: Stiffness of right shoulder, not elsewhere classified  Aftercare following surgery for neoplasm  Abnormal posture  Stiffness of left shoulder, not elsewhere classified  Muscle weakness (generalized)     Problem List Patient Active Problem List   Diagnosis Date Noted  . Acquired absence of breast 03/22/2020  . Breast cancer ([place]) 03/13/2020  . Genetic testing 01/05/2020  . Family history of breast cancer   . Family history of leukemia   . 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 Obesity, Class III, BMI 40-49.9 (morbid obesity) ([place]) 12/29/2017  . Benign essential HTN 06/13/2015    Stark Bray 07/04/2020, 10:54 AM  [place], [place], [address] Phone: [phone]   Fax:  [phone]  Name: Stacey Singh MRN: [id number] Date of Birth: December 30, 1975

## 2020-07-09 ENCOUNTER — Ambulatory Visit: Payer: BC Managed Care – PPO

## 2020-07-09 ENCOUNTER — Other Ambulatory Visit: Payer: Self-pay

## 2020-07-09 DIAGNOSIS — M25611 Stiffness of right shoulder, not elsewhere classified: Secondary | ICD-10-CM

## 2020-07-09 DIAGNOSIS — Z483 Aftercare following surgery for neoplasm: Secondary | ICD-10-CM

## 2020-07-09 DIAGNOSIS — M25612 Stiffness of left shoulder, not elsewhere classified: Secondary | ICD-10-CM

## 2020-07-09 DIAGNOSIS — M6281 Muscle weakness (generalized): Secondary | ICD-10-CM

## 2020-07-09 DIAGNOSIS — R293 Abnormal posture: Secondary | ICD-10-CM

## 2020-07-09 NOTE — Therapy (Signed)
[place], [place], [address] Phone: [phone]   Fax:  [phone]  Physical Therapy Treatment  Patient Details  Name: Stacey Singh MRN: [id number] Date of Birth: 1976/01/19 Referring Provider (PT): Dr. Marla Roe    Encounter Date: 07/09/2020   PT End of Session - 07/09/20 0901    Visit Number 12    Number of Visits 25    Date for PT Re-Evaluation 07/25/20    PT Start Time 0806    PT Stop Time 2482    PT Time Calculation (min) 51 min    Activity Tolerance Patient tolerated treatment well    Behavior During Therapy [facility] for tasks assessed/performed           Past Medical History:  Diagnosis Date  . Allergy   . Family history of breast cancer   . Family history of leukemia   . Heart murmur    in high school, resolved  . Heartburn in pregnancy   . Hypertension    with current pregnancy  . Hypertension   . No pertinent past medical history     Past Surgical History:  Procedure Laterality Date  . BREAST RECONSTRUCTION WITH PLACEMENT OF TISSUE EXPANDER AND FLEX HD (ACELLULAR HYDRATED DERMIS) Bilateral 03/13/2020   Procedure: BILATERAL BREAST RECONSTRUCTION WITH PLACEMENT OF TISSUE EXPANDERS AND FLEX HD (ACELLULAR HYDRATED DERMIS);  Surgeon: Wallace Going, DO;  Location: [place];  Service: Plastics;  Laterality: Bilateral;  . CESAREAN SECTION  04/04/2012   Procedure: CESAREAN SECTION;  Surgeon: Farrel Gobble. Harrington Challenger, MD;  Location: [place] [facility];  Service: Obstetrics;  Laterality: N/A;  . CESAREAN SECTION N/A 05/19/2015   Procedure: CESAREAN SECTION;  Surgeon: Allyn Kenner, DO;  Location: [place] [facility];  Service: Obstetrics;  Laterality: N/A;  . CESAREAN SECTION    . DILATION AND CURETTAGE OF UTERUS    . MASTECTOMY W/ SENTINEL NODE BIOPSY Bilateral 03/13/2020   Procedure: BILATERAL MASTECTOMY WITH RIGHT SENTINEL LYMPH NODE BIOPSY;  Surgeon: Jovita Kussmaul, MD;  Location: [place];  Service: General;  Laterality: Bilateral;  PEC BLOCK  . REMOVAL OF BILATERAL TISSUE EXPANDERS WITH PLACEMENT OF BILATERAL BREAST IMPLANTS Bilateral 06/13/2020   Procedure: REMOVAL OF BILATERAL TISSUE EXPANDERS WITH PLACEMENT OF BILATERAL BREAST IMPLANTS;  Surgeon: Wallace Going, DO;  Location: [place];  Service: Plastics;  Laterality: Bilateral;  . WISDOM TOOTH EXTRACTION      There were no vitals filed for this visit.   Subjective Assessment - 07/09/20 0805    Subjective Having some discomfort at the right upper breast, but not really pain.  Discomfort is worse at night so I take some ibuprofen. Right side AROM may be just a little less.    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  She has bilateral mastecomy on 03/13/2020 with immediate expanders Plans to have implants on June 13, 2020    Currently in Pain? No/denies    Pain Score 0-No pain                             [facility] Adult PT Treatment/Exercise - 07/09/20 0001      Manual Therapy   Manual therapy comments pt was instructed to use inferior pressure on the Rt implant per surgeon    Manual Lymphatic Drainage (MLD) short neck, bil axillary and Inguinal nodes, MLD done  to bilateral lateral trunk/breast using axillo inguinal pathways in supine and SL    Passive ROM PROM bilateral Shoulder flex and abd                  PT Education - 07/09/20 0900    Education Details Pt was briefly educated in self MLD working toward inguinal pathways    Person(s) Educated Patient    Methods Explanation;Demonstration    Comprehension Verbalized understanding            PT Short Term Goals - 05/30/20 1433      PT SHORT TERM GOAL #1   Time 4    Period Weeks    Status Partially Met             PT Long Term Goals - 07/04/20 1051      PT LONG TERM GOAL #1   Title Pt will have > 150 degrees of right and left shoulder abduction so  that she can return to her normal activities    Status Achieved      PT LONG TERM GOAL #2   Title Pt will be inpependent in a HEP  for shoulder ROM and strength    Status Achieved      PT LONG TERM GOAL #3   Title Able to fully brush the back of her hair without limitation    Status Achieved      PT LONG TERM GOAL #4   Title Able to open jars without difficulty    Status Achieved      PT LONG TERM GOAL #5   Title Quick dash no greater than 10%    Status On-going      PT LONG TERM GOAL #6   Title Able to perorm personal hygiene with right UE with improved ease    Status Achieved      PT LONG TERM GOAL #7   Title Able to turn steering wheel with right upper extremity with improved ease when driving with 1 hand    Status Achieved                 Plan - 07/09/20 0902    Clinical Impression Statement therapy consisted of PROM bilateral shoulders and MLD to bilateral lateral chest and superior right chest and inferior left chest to reduce edema.   Main tightness noted with bilateral ER.  Left shoulder slightly more limited than right    Personal Factors and Comorbidities Comorbidity 2    Comorbidities bilateral mastecomy, expanders with fills    Examination-Activity Limitations Caring for Others;Bend;Reach Overhead;Hygiene/Grooming    Examination-Participation Restrictions Meal Prep;Laundry    Stability/Clinical Decision Making Stable/Uncomplicated    Rehab Potential Good    PT Frequency 2x / week    PT Duration 8 weeks    PT Treatment/Interventions ADLs/Self Care Home Management;DME Instruction;Therapeutic activities;Therapeutic exercise;Neuromuscular re-education;Patient/family education;Manual techniques;Manual lymph drainage;Passive range of motion;Scar mobilization    PT Next Visit Plan bil lateral chest and anterior breast MLD for post op, PROM both shoulders,educated pt on strength ABC before final visit for pt to self progress, sleeve ?EXS on 1/2 foam roll   PT Home  Exercise Plan scap retraction, supine dowel, supine scap    Consulted and Agree with Plan of Care Patient           Patient will benefit from skilled therapeutic intervention in order to improve the following deficits and impairments:  Decreased skin integrity,Increased muscle spasms,Impaired perceived functional ability,Decreased range of motion,Decreased  knowledge of precautions,Decreased activity tolerance,Decreased knowledge of use of DME,Decreased strength,Increased fascial restricitons,Impaired UE functional use,Postural dysfunction,Pain  Visit Diagnosis: Stiffness of right shoulder, not elsewhere classified  Aftercare following surgery for neoplasm  Abnormal posture  Stiffness of left shoulder, not elsewhere classified  Muscle weakness (generalized)     Problem List Patient Active Problem List   Diagnosis Date Noted  . Acquired absence of breast 03/22/2020  . Breast cancer ([place]) 03/13/2020  . Genetic testing 01/05/2020  . Family history of breast cancer   . Family history of leukemia   . 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 Obesity, Class III, BMI 40-49.9 (morbid obesity) ([place]) 12/29/2017  . Benign essential HTN 06/13/2015    Claris Pong 07/09/2020, 10:21 AM  [place], [place], [address] Phone: [phone]   Fax:  [phone]  Name: Stacey Singh MRN: [id number] Date of Birth: 09-26-1975 Cheral Almas, PT 07/09/20 10:23 AM

## 2020-07-17 ENCOUNTER — Other Ambulatory Visit: Payer: Self-pay

## 2020-07-17 ENCOUNTER — Ambulatory Visit: Payer: BC Managed Care – PPO

## 2020-07-17 DIAGNOSIS — Z9889 Other specified postprocedural states: Secondary | ICD-10-CM | POA: Insufficient documentation

## 2020-07-17 DIAGNOSIS — M25611 Stiffness of right shoulder, not elsewhere classified: Secondary | ICD-10-CM | POA: Diagnosis not present

## 2020-07-17 DIAGNOSIS — Z483 Aftercare following surgery for neoplasm: Secondary | ICD-10-CM

## 2020-07-17 DIAGNOSIS — R293 Abnormal posture: Secondary | ICD-10-CM

## 2020-07-17 DIAGNOSIS — M6281 Muscle weakness (generalized): Secondary | ICD-10-CM

## 2020-07-17 DIAGNOSIS — M25612 Stiffness of left shoulder, not elsewhere classified: Secondary | ICD-10-CM

## 2020-07-17 NOTE — Therapy (Signed)
[place], [place], [address] Phone: [phone]   Fax:  [phone]  Physical Therapy Treatment  Patient Details  Name: RUTHY FORRY MRN: [id number] Date of Birth: [birth year] Referring Provider (PT): Dr. Marla Roe    Encounter Date: 07/17/2020   PT End of Session - 07/17/20 0903    Visit Number 13    Number of Visits 25    Date for PT Re-Evaluation 07/25/20    PT Start Time 0805    PT Stop Time 0903    PT Time Calculation (min) 58 min    Activity Tolerance Patient tolerated treatment well    Behavior During Therapy [facility] for tasks assessed/performed           Past Medical History:  Diagnosis Date  . Allergy   . Family history of breast cancer   . Family history of leukemia   . Heart murmur    in high school, resolved  . Heartburn in pregnancy   . Hypertension    with current pregnancy  . Hypertension   . No pertinent past medical history     Past Surgical History:  Procedure Laterality Date  . BREAST RECONSTRUCTION WITH PLACEMENT OF TISSUE EXPANDER AND FLEX HD (ACELLULAR HYDRATED DERMIS) Bilateral 03/13/2020   Procedure: BILATERAL BREAST RECONSTRUCTION WITH PLACEMENT OF TISSUE EXPANDERS AND FLEX HD (ACELLULAR HYDRATED DERMIS);  Surgeon: Wallace Going, DO;  Location: [place];  Service: Plastics;  Laterality: Bilateral;  . CESAREAN SECTION  04/04/2012   Procedure: CESAREAN SECTION;  Surgeon: Farrel Gobble. Harrington Challenger, MD;  Location: [place] [facility];  Service: Obstetrics;  Laterality: N/A;  . CESAREAN SECTION N/A 05/19/2015   Procedure: CESAREAN SECTION;  Surgeon: Allyn Kenner, DO;  Location: [place] [facility];  Service: Obstetrics;  Laterality: N/A;  . CESAREAN SECTION    . DILATION AND CURETTAGE OF UTERUS    . MASTECTOMY W/ SENTINEL NODE BIOPSY Bilateral 03/13/2020   Procedure: BILATERAL MASTECTOMY WITH RIGHT SENTINEL LYMPH NODE BIOPSY;  Surgeon: Jovita Kussmaul, MD;  Location: [place];  Service: General;  Laterality: Bilateral;  PEC BLOCK  . REMOVAL OF BILATERAL TISSUE EXPANDERS WITH PLACEMENT OF BILATERAL BREAST IMPLANTS Bilateral 06/13/2020   Procedure: REMOVAL OF BILATERAL TISSUE EXPANDERS WITH PLACEMENT OF BILATERAL BREAST IMPLANTS;  Surgeon: Wallace Going, DO;  Location: [place];  Service: Plastics;  Laterality: Bilateral;  . WISDOM TOOTH EXTRACTION      There were no vitals filed for this visit.   Subjective Assessment - 07/17/20 0807    Subjective I tried running conservatively Saturday for about 40 mins on my treadmill doing 5 mins run/5 mins walk. The next day my Rt upper quadrant was so tight and my implant felt like it was going to "fall off". I saw Dr. Marlou Starks yesterday and he didn't want to give me any answers until I see Dr. Marla Roe which will be tomorrow. So I'm hoping she'll say I'm okay to cont to try running but maybe need to pace myself a little more though I thought I was bc I wsan't all out running, just jogging when I did.    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  She has bilateral mastecomy on 03/13/2020 with immediate expanders Plans to have implants on June 13, 2020    Patient Stated Goals to return arms back to normal, to be able to wash her own hair., perform hygiene  Currently in Pain? No/denies   just tightness at Rt ant shoulder/pect                            [facility] Adult PT Treatment/Exercise - 07/17/20 0001      Manual Therapy   Manual Therapy Myofascial release;Manual Lymphatic Drainage (MLD);Passive ROM    Myofascial Release To Rt chest wall and UE pulling all during P/ROM    Manual Lymphatic Drainage (MLD) short neck, bil  Inguinal nodes, MLD done to bilateral lateral trunk/breast using axillo inguinal pathways in supine and SL    Passive ROM PROM bilateral Shoulder flex, abd, and D2 to pts available ROM                    PT  Short Term Goals - 05/30/20 1433      PT SHORT TERM GOAL #1   Time 4    Period Weeks    Status Partially Met             PT Long Term Goals - 07/04/20 1051      PT LONG TERM GOAL #1   Title Pt will have > 150 degrees of right and left shoulder abduction so that she can return to her normal activities    Status Achieved      PT LONG TERM GOAL #2   Title Pt will be inpependent in a HEP  for shoulder ROM and strength    Status Achieved      PT LONG TERM GOAL #3   Title Able to fully brush the back of her hair without limitation    Status Achieved      PT LONG TERM GOAL #4   Title Able to open jars without difficulty    Status Achieved      PT LONG TERM GOAL #5   Title Quick dash no greater than 10%    Status On-going      PT LONG TERM GOAL #6   Title Able to perorm personal hygiene with right UE with improved ease    Status Achieved      PT LONG TERM GOAL #7   Title Able to turn steering wheel with right upper extremity with improved ease when driving with 1 hand    Status Achieved                 Plan - 07/17/20 0903    Clinical Impression Statement Pt came in c/o incresaed Rt upper quadrant tightness from first trial of intermittent running/walking on her treadmill over the weekend. So continued with manual therapy with focus on improving end P/ROM but also incldued MFR to Rt>Lt chest wall working to decrease tightness. Pt reports good relief of symptoms after session today. Encouraged her to be more proactive about stretching multiple times/day into her end A/ROMs in doorway to help promote increased flexibility as she conts to recover from recent implant exchange. Pt verblaized understanding.    Personal Factors and Comorbidities Comorbidity 2    Comorbidities bilateral mastecomy, expanders with fills    Examination-Activity Limitations Caring for Others;Bend;Reach Overhead;Hygiene/Grooming    Examination-Participation Restrictions Meal Prep;Laundry     Stability/Clinical Decision Making Stable/Uncomplicated    Rehab Potential Good    PT Frequency 2x / week    PT Duration 8 weeks    PT Treatment/Interventions ADLs/Self Care Home Management;DME Instruction;Therapeutic activities;Therapeutic exercise;Neuromuscular re-education;Patient/family education;Manual techniques;Manual lymph drainage;Passive range of motion;Scar mobilization  PT Next Visit Plan How was visit with Dr. Marla Roe? bil lateral chest and anterior breast MLD for post op, PROM both shoulders,educated pt on strength ABC before final visit for pt to self progress, brig sleeve    PT Home Exercise Plan scap retraction, supine dowel, supine scap; intermittent end ROM stretching during day in doorway    Consulted and Agree with Plan of Care Patient           Patient will benefit from skilled therapeutic intervention in order to improve the following deficits and impairments:  Decreased skin integrity,Increased muscle spasms,Impaired perceived functional ability,Decreased range of motion,Decreased knowledge of precautions,Decreased activity tolerance,Decreased knowledge of use of DME,Decreased strength,Increased fascial restricitons,Impaired UE functional use,Postural dysfunction,Pain  Visit Diagnosis: Stiffness of right shoulder, not elsewhere classified  Aftercare following surgery for neoplasm  Abnormal posture  Stiffness of left shoulder, not elsewhere classified  Muscle weakness (generalized)     Problem List Patient Active Problem List   Diagnosis Date Noted  . Acquired absence of breast 03/22/2020  . Breast cancer ([place]) 03/13/2020  . Genetic testing 01/05/2020  . Family history of breast cancer   . Family history of leukemia   . 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 Obesity, Class III, BMI 40-49.9 (morbid obesity) ([place]) 12/29/2017  . Benign essential HTN 06/13/2015    Otelia Limes, PTA 07/17/2020, 9:10 AM  [place], [place], [address] Phone: [phone]   Fax:  [phone]  Name: SHUNTELL FOODY MRN: [id number] Date of Birth: 1976/02/26

## 2020-07-17 NOTE — Progress Notes (Signed)
Patient is a 44 year old female here for follow-up after removal of bilateral tissue expanders and placement of bilateral breast implants with Dr. Marla Roe on 06/13/2020. Patient has [place]or smooth round ultra high profile gel 535 cc implants placed bilaterally.  ~ 5 weeks PO Patient reports she is doing well.  Denies fever/chills, nausea/vomiting, breast pain.  Steri-Strips were removed today.  Bilateral incisions are healing very nicely, C/D/I.  No signs of infection, redness, drainage, seroma/hematoma.  Overall patient is happy with her progress.  She has some concern with the access tissue along the lateral sides of the breast as well as a fold underneath the breasts.  She expresses interest in some fat grafting to help smooth the contours of the superior and medial aspects of the breasts.  Recommend continuing compression garment for 1 more week 24/7 and then may wear during the day as possible.  May begin to resume normal activities as tolerated gradually after 1 more week.  May shower normally.  Recommend follow-up in 3 to 4 weeks with Dr. Marla Roe to discuss options.  Return precautions provided.  Call office with any questions/concerns.  Pictures were obtained of the patient and placed in the chart with the patient's or guardian's permission.

## 2020-07-18 ENCOUNTER — Other Ambulatory Visit: Payer: Self-pay

## 2020-07-18 ENCOUNTER — Encounter: Payer: Self-pay | Admitting: Plastic Surgery

## 2020-07-18 ENCOUNTER — Ambulatory Visit (INDEPENDENT_AMBULATORY_CARE_PROVIDER_SITE_OTHER): Payer: BC Managed Care – PPO | Admitting: Plastic Surgery

## 2020-07-18 VITALS — BP 107/79 | HR 84 | Temp 98.5°F

## 2020-07-18 DIAGNOSIS — Z9889 Other specified postprocedural states: Secondary | ICD-10-CM

## 2020-07-25 ENCOUNTER — Ambulatory Visit: Payer: BC Managed Care – PPO | Admitting: Rehabilitation

## 2020-07-25 ENCOUNTER — Encounter: Payer: Self-pay | Admitting: Rehabilitation

## 2020-07-25 ENCOUNTER — Other Ambulatory Visit: Payer: Self-pay

## 2020-07-25 DIAGNOSIS — M25611 Stiffness of right shoulder, not elsewhere classified: Secondary | ICD-10-CM | POA: Diagnosis not present

## 2020-07-25 DIAGNOSIS — Z483 Aftercare following surgery for neoplasm: Secondary | ICD-10-CM

## 2020-07-25 DIAGNOSIS — M25612 Stiffness of left shoulder, not elsewhere classified: Secondary | ICD-10-CM

## 2020-07-25 DIAGNOSIS — R293 Abnormal posture: Secondary | ICD-10-CM

## 2020-07-25 DIAGNOSIS — M6281 Muscle weakness (generalized): Secondary | ICD-10-CM

## 2020-07-25 NOTE — Therapy (Signed)
[place], [place], [address] Phone: [phone]   Fax:  [phone]  Physical Therapy Treatment  Patient Details  Name: Stacey Singh MRN: [id number] Date of Birth: 1975/09/24 Referring Provider (PT): Dr. Marla Roe    Encounter Date: 07/25/2020   PT End of Session - 07/25/20 1659    Visit Number 14    Number of Visits 25    Date for PT Re-Evaluation 07/25/20    PT Start Time 1600    PT Stop Time 1655    PT Time Calculation (min) 55 min    Activity Tolerance Patient tolerated treatment well    Behavior During Therapy [facility] for tasks assessed/performed           Past Medical History:  Diagnosis Date  . Allergy   . Family history of breast cancer   . Family history of leukemia   . Heart murmur    in high school, resolved  . Heartburn in pregnancy   . Hypertension    with current pregnancy  . Hypertension   . No pertinent past medical history     Past Surgical History:  Procedure Laterality Date  . BREAST RECONSTRUCTION WITH PLACEMENT OF TISSUE EXPANDER AND FLEX HD (ACELLULAR HYDRATED DERMIS) Bilateral 03/13/2020   Procedure: BILATERAL BREAST RECONSTRUCTION WITH PLACEMENT OF TISSUE EXPANDERS AND FLEX HD (ACELLULAR HYDRATED DERMIS);  Surgeon: Wallace Going, DO;  Location: [place];  Service: Plastics;  Laterality: Bilateral;  . CESAREAN SECTION  04/04/2012   Procedure: CESAREAN SECTION;  Surgeon: Farrel Gobble. Harrington Challenger, MD;  Location: [facility];  Service: Obstetrics;  Laterality: N/A;  . CESAREAN SECTION N/A 05/19/2015   Procedure: CESAREAN SECTION;  Surgeon: Allyn Kenner, DO;  Location: [facility];  Service: Obstetrics;  Laterality: N/A;  . CESAREAN SECTION    . DILATION AND CURETTAGE OF UTERUS    . MASTECTOMY W/ SENTINEL NODE BIOPSY Bilateral 03/13/2020   Procedure: BILATERAL MASTECTOMY WITH RIGHT SENTINEL LYMPH NODE BIOPSY;  Surgeon: Jovita Kussmaul, MD;  Location: [place];  Service: General;  Laterality: Bilateral;  PEC BLOCK  . REMOVAL OF BILATERAL TISSUE EXPANDERS WITH PLACEMENT OF BILATERAL BREAST IMPLANTS Bilateral 06/13/2020   Procedure: REMOVAL OF BILATERAL TISSUE EXPANDERS WITH PLACEMENT OF BILATERAL BREAST IMPLANTS;  Surgeon: Wallace Going, DO;  Location: [place];  Service: Plastics;  Laterality: Bilateral;  . WISDOM TOOTH EXTRACTION      There were no vitals filed for this visit.   Subjective Assessment - 07/25/20 1603    Subjective Shoulder soreness is gone but I still have alot of tightness in my Rt chest.  The surgeon says everything is ok.    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  She has bilateral mastecomy on 03/13/2020 with immediate expanders Plans to have implants on June 13, 2020    Patient Stated Goals to return arms back to normal, to be able to wash her own hair., perform hygiene    Currently in Pain? No/denies                             [facility] Adult PT Treatment/Exercise - 07/25/20 0001      Manual Therapy   Myofascial Release To Rt chest wall and UE pulling all during P/ROM    Manual Lymphatic Drainage (MLD) short neck, bil  Inguinal nodes, MLD done  to bilateral lateral trunk/breast using axillo inguinal pathways in supine and SL    Passive ROM PROM bilateral Shoulder flex, abd, and D2 to pts available ROM                    PT Short Term Goals - 05/30/20 1433      PT SHORT TERM GOAL #1   Time 4    Period Weeks    Status Partially Met             PT Long Term Goals - 07/04/20 1051      PT LONG TERM GOAL #1   Title Pt will have > 150 degrees of right and left shoulder abduction so that she can return to her normal activities    Status Achieved      PT LONG TERM GOAL #2   Title Pt will be inpependent in a HEP  for shoulder ROM and strength    Status Achieved      PT LONG TERM GOAL #3   Title Able to fully  brush the back of her hair without limitation    Status Achieved      PT LONG TERM GOAL #4   Title Able to open jars without difficulty    Status Achieved      PT LONG TERM GOAL #5   Title Quick dash no greater than 10%    Status On-going      PT LONG TERM GOAL #6   Title Able to perorm personal hygiene with right UE with improved ease    Status Achieved      PT LONG TERM GOAL #7   Title Able to turn steering wheel with right upper extremity with improved ease when driving with 1 hand    Status Achieved                 Plan - 07/25/20 1659    Clinical Impression Statement Pt's Rt implant softer and lower than when this PT saw her around 2 weeks ago.  Pt with overall minimal edema in the chest/lateral breast but still with tightness and discomfort in the Rt quadrant.  Continued with STM today pectoralis bilaterally.  Pt will schedule most likely every 2 weeks as she would like to be able to return to running and strength training now that the edema is minimized.  Pt has not attempted running since last visit but may try again before next appt.  Discussed importance of good sports bra and really starting with small increments as pts first attempt was 25mn on/530m off x 4039m    PT Frequency Biweekly    PT Duration 8 weeks    PT Treatment/Interventions ADLs/Self Care Home Management;DME Instruction;Therapeutic activities;Therapeutic exercise;Neuromuscular re-education;Patient/family education;Manual techniques;Manual lymph drainage;Passive range of motion;Scar mobilization    PT Next Visit Plan Start performing strength similar to strength ABC movements / Tband with education for pt to perform for the next 2 weeks with bil lateral chest and anterior breast MLD for post op as needed    Consulted and Agree with Plan of Care Patient           Patient will benefit from skilled therapeutic intervention in order to improve the following deficits and impairments:     Visit  Diagnosis: Stiffness of right shoulder, not elsewhere classified  Aftercare following surgery for neoplasm  Abnormal posture  Stiffness of left shoulder, not elsewhere classified  Muscle weakness (generalized)     Problem List  Patient Active Problem List   Diagnosis Date Noted  . S/P breast reconstruction, bilateral 07/17/2020  . Acquired absence of breast 03/22/2020  . Breast cancer ([place]) 03/13/2020  . Genetic testing 01/05/2020  . Family history of breast cancer   . Family history of leukemia   . 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 Obesity, Class III, BMI 40-49.9 (morbid obesity) ([place]) 12/29/2017  . Benign essential HTN 06/13/2015    Stark Bray 07/25/2020, 5:03 PM  [place], [place], [address] Phone: [phone]   Fax:  [phone]  Name: Stacey Singh MRN: [id number] Date of Birth: 24-Oct-1975

## 2020-08-05 DIAGNOSIS — Z8616 Personal history of COVID-19: Secondary | ICD-10-CM

## 2020-08-05 HISTORY — DX: Personal history of COVID-19: Z86.16

## 2020-08-08 ENCOUNTER — Ambulatory Visit: Payer: BC Managed Care – PPO

## 2020-08-09 ENCOUNTER — Ambulatory Visit: Payer: BC Managed Care – PPO | Attending: Plastic Surgery

## 2020-08-09 ENCOUNTER — Other Ambulatory Visit: Payer: Self-pay

## 2020-08-09 DIAGNOSIS — M25611 Stiffness of right shoulder, not elsewhere classified: Secondary | ICD-10-CM | POA: Diagnosis present

## 2020-08-09 DIAGNOSIS — M6281 Muscle weakness (generalized): Secondary | ICD-10-CM | POA: Diagnosis present

## 2020-08-09 DIAGNOSIS — R293 Abnormal posture: Secondary | ICD-10-CM | POA: Diagnosis present

## 2020-08-09 DIAGNOSIS — Z483 Aftercare following surgery for neoplasm: Secondary | ICD-10-CM | POA: Insufficient documentation

## 2020-08-09 DIAGNOSIS — M25612 Stiffness of left shoulder, not elsewhere classified: Secondary | ICD-10-CM | POA: Insufficient documentation

## 2020-08-09 NOTE — Therapy (Signed)
[place], [place], [address] Phone: [phone]   Fax:  [phone]  Physical Therapy Treatment  Patient Details  Name: Stacey Singh MRN: [id number] Date of Birth: May 22, 1976 Referring Provider (PT): Dr. Marla Roe    Encounter Date: 08/09/2020   PT End of Session - 08/09/20 2201    Visit Number 15    Number of Visits 25    Date for PT Re-Evaluation 10/04/20    PT Start Time 0905    PT Stop Time 0956    PT Time Calculation (min) 51 min    Activity Tolerance Patient tolerated treatment well    Behavior During Therapy [facility] for tasks assessed/performed           Past Medical History:  Diagnosis Date  . Allergy   . Family history of breast cancer   . Family history of leukemia   . Heart murmur    in high school, resolved  . Heartburn in pregnancy   . Hypertension    with current pregnancy  . Hypertension   . No pertinent past medical history     Past Surgical History:  Procedure Laterality Date  . BREAST RECONSTRUCTION WITH PLACEMENT OF TISSUE EXPANDER AND FLEX HD (ACELLULAR HYDRATED DERMIS) Bilateral 03/13/2020   Procedure: BILATERAL BREAST RECONSTRUCTION WITH PLACEMENT OF TISSUE EXPANDERS AND FLEX HD (ACELLULAR HYDRATED DERMIS);  Surgeon: Wallace Going, DO;  Location: [place];  Service: Plastics;  Laterality: Bilateral;  . CESAREAN SECTION  04/04/2012   Procedure: CESAREAN SECTION;  Surgeon: Farrel Gobble. Harrington Challenger, MD;  Location: [facility];  Service: Obstetrics;  Laterality: N/A;  . CESAREAN SECTION N/A 05/19/2015   Procedure: CESAREAN SECTION;  Surgeon: Allyn Kenner, DO;  Location: [facility];  Service: Obstetrics;  Laterality: N/A;  . CESAREAN SECTION    . DILATION AND CURETTAGE OF UTERUS    . MASTECTOMY W/ SENTINEL NODE BIOPSY Bilateral 03/13/2020   Procedure: BILATERAL MASTECTOMY WITH RIGHT SENTINEL LYMPH NODE BIOPSY;  Surgeon: Jovita Kussmaul, MD;  Location: [facility];  Service: General;  Laterality: Bilateral;  PEC BLOCK  . REMOVAL OF BILATERAL TISSUE EXPANDERS WITH PLACEMENT OF BILATERAL BREAST IMPLANTS Bilateral 06/13/2020   Procedure: REMOVAL OF BILATERAL TISSUE EXPANDERS WITH PLACEMENT OF BILATERAL BREAST IMPLANTS;  Surgeon: Wallace Going, DO;  Location: [place];  Service: Plastics;  Laterality: Bilateral;  . WISDOM TOOTH EXTRACTION      There were no vitals filed for this visit.   Subjective Assessment - 08/09/20 0902    Subjective The past couple of days I have had a sinus infection and my right chest is so tight, and even at rest it is painful.  Last night I tried to go with minimal compression and it felt a little better. I tried walking briskly for 30 min and her chest did well. She has been doing the stretches and the supine TBand.    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  She has bilateral mastecomy on 03/13/2020 with immediate expanders Plans to have implants on June 13, 2020    Currently in Pain? Yes    Pain Score 2     Pain Location Chest    Pain Orientation Right    Pain Onset More than a month ago    Pain Frequency Intermittent    Pain Relieving Factors sometimes shifting the implant on right, bra that isn't too tight  in the band              [facility] PT Assessment - 08/09/20 0001      AROM   Right Shoulder Extension 71 Degrees    Right Shoulder Flexion 165 Degrees    Right Shoulder ABduction 175 Degrees    Right Shoulder Internal Rotation 50 Degrees    Right Shoulder External Rotation 102 Degrees             LYMPHEDEMA/ONCOLOGY QUESTIONNAIRE - 08/09/20 0001      Right Upper Extremity Lymphedema   10 cm Proximal to Olecranon Process 36 cm    Olecranon Process 27.8 cm    15 cm Proximal to Ulnar Styloid Process 25 cm    Just Proximal to Ulnar Styloid Process 16 cm    Across Hand at [employer] 19.5 cm    At [place] of 2nd Digit 6.5 cm               Quick Dash - 08/09/20 0001    Open a tight or new jar Mild difficulty    Do heavy household chores (wash walls, wash floors) Moderate difficulty    Carry a shopping bag or briefcase Mild difficulty    Wash your back Moderate difficulty    Use a knife to cut food No difficulty    Recreational activities in which you take some force or impact through your arm, shoulder, or hand (golf, hammering, tennis) Moderate difficulty    During the past week, to what extent has your arm, shoulder or hand problem interfered with your normal social activities with family, friends, neighbors, or groups? Modererately    During the past week, to what extent has your arm, shoulder or hand problem limited your work or other regular daily activities Slightly    Arm, shoulder, or hand pain. Mild    Tingling (pins and needles) in your arm, shoulder, or hand None    Difficulty Sleeping Mild difficulty    DASH Score 29.55 %                  [facility] Adult PT Treatment/Exercise - 08/09/20 0001      Elbow Exercises   Elbow Flexion Strengthening;Both;10 reps   3 #     Shoulder Exercises: Standing   Horizontal ABduction Strengthening;Both;10 reps    Theraband Level (Shoulder Horizontal ABduction) Level 2 (Red)    External Rotation Strengthening;Both;10 reps    Theraband Level (Shoulder External Rotation) Level 2 (Red)    Extension Strengthening;Both;10 reps    Theraband Level (Shoulder Extension) Level 2 (Red)    Retraction Strengthening;Both;10 reps    Theraband Level (Shoulder Retraction) Level 2 (Red)    Other Standing Exercises Jobes flex and scaption 0#, 1# x 10 ea B    Other Standing Exercises doorway chest stretch 3 x 20 sec      Shoulder Exercises: Pulleys   Flexion 2 minutes    ABduction 2 minutes                  PT Education - 08/09/20 1010    Education Details pt was educated in standing theraband exs for Scapular retraction, extension, bilateral ER, Horizontal  abduction all with red TBand x 10, and standing jobes flexion and scaption (0#,1#), and doorway pec stretch.  She was given illustrated and written instructions for progression and advised to stop if she has pain    Person(s) Educated Patient    Methods Explanation;Demonstration;Handout  Comprehension Verbalized understanding;Returned demonstration            PT Short Term Goals - 05/30/20 1433      PT SHORT TERM GOAL #1   Time 4    Period Weeks    Status Partially Met             PT Long Term Goals - 08/09/20 2209      PT LONG TERM GOAL #1   Title Pt will have > 150 degrees of right and left shoulder abduction so that she can return to her normal activities    Time 8    Period Weeks    Status Achieved      PT LONG TERM GOAL #2   Title Pt will be inpependent in a HEP  for shoulder ROM and strength    Time 8    Period Weeks    Status Achieved      PT LONG TERM GOAL #3   Title Able to fully brush the back of her hair without limitation    Time 6    Period Weeks    Status Achieved      PT LONG TERM GOAL #4   Title Able to open jars without difficulty    Time 6    Period Weeks    Status Achieved      PT LONG TERM GOAL #5   Title Quick dash no greater than 10%    Time 8    Period Weeks    Status On-going    Target Date 10/04/20      PT LONG TERM GOAL #6   Title Able to perform personal hygiene with right UE with improved ease    Time 5    Period Weeks    Status Achieved      PT LONG TERM GOAL #7   Title Able to turn steering wheel with right upper extremity with improved ease when driving with 1 hand    Period Weeks    Status Achieved      PT LONG TERM GOAL #8   Title Pt will be independent  in and advanced HEP for shoulder strength/stability    Time 8    Period Weeks    Status New    Target Date 10/04/20      PT LONG TERM GOAL  #9   TITLE Pt will be able to lift 10 lb to waist height without difficulty or increased pain    Time 8    Period  Weeks    Status New    Target Date 10/04/20      PT LONG TERM GOAL  #10   TITLE Pt will tolerate return to jogging/home activities with 0- minimal chest discomfort    Time 8    Period Weeks    Status New    Target Date 10/04/20                 Plan - 08/09/20 2202    Clinical Impression Statement Pt is complaining of increased right chest discomfort after several days of coughing.  She has been able to tolerate walking briskly for 30 min a day without complaint, and has been compliant with supine TBand exercises.  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  She tolerated progression of theraband to standing exs and addition of jobes flexion and scaption with 1# wt.  She was  very focused on maintaining scapular depression to avoid upper trap compensation.  She has improved Right shoulder ROM and no increased edema noted in the right UE.  She will benefit from further instruction to enable her to gain greater strength to return to PLOF.    Personal Factors and Comorbidities Comorbidity 2    Comorbidities bilateral mastecomy, expanders with fills    Examination-Activity Limitations Caring for Others;Bend;Reach Overhead;Hygiene/Grooming    Examination-Participation Restrictions Meal Prep;Laundry    Stability/Clinical Decision Making Stable/Uncomplicated    Rehab Potential Excellent    PT Frequency Bimonthly    PT Duration 8 weeks    PT Treatment/Interventions ADLs/Self Care Home Management;DME Instruction;Therapeutic activities;Therapeutic exercise;Neuromuscular re-education;Patient/family education;Manual techniques;Manual lymph drainage;Passive range of motion;Scar mobilization    PT Next Visit Plan Review and progress TBand prn, review ABC strength packet given today and strength exs especially for core and upper body.    PT Home Exercise Plan scap retraction, supine dowel, supine scap; intermittent end ROM  stretching during day in doorway, theraband, Jobes flex and scaption, Elbow flexion    Consulted and Agree with Plan of Care Patient           Patient will benefit from skilled therapeutic intervention in order to improve the following deficits and impairments:  Decreased skin integrity,Increased muscle spasms,Impaired perceived functional ability,Decreased range of motion,Decreased knowledge of precautions,Decreased activity tolerance,Decreased knowledge of use of DME,Decreased strength,Increased fascial restricitons,Impaired UE functional use,Postural dysfunction,Pain  Visit Diagnosis: Stiffness of right shoulder, not elsewhere classified  Aftercare following surgery for neoplasm  Abnormal posture  Stiffness of left shoulder, not elsewhere classified  Muscle weakness (generalized)     Problem List Patient Active Problem List   Diagnosis Date Noted  . S/P breast reconstruction, bilateral 07/17/2020  . Acquired absence of breast 03/22/2020  . Breast cancer ([place]) 03/13/2020  . Genetic testing 01/05/2020  . Family history of breast cancer   . Family history of leukemia   . 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 Obesity, Class III, BMI 40-49.9 (morbid obesity) ([place]) 12/29/2017  . Benign essential HTN 06/13/2015    Claris Pong 08/09/2020, 10:18 PM  [place], [place], [address] Phone: [phone]   Fax:  [phone]  Name: Stacey Singh MRN: [id number] Date of Birth: 1976-07-16  Cheral Almas, PT 08/09/20 10:20 PM

## 2020-08-09 NOTE — Patient Instructions (Signed)
Access Code: [email or web address] URL: [email or web address] Date: 08/09/2020 Prepared by: Cheral Almas  Exercises Scapular Retraction with Resistance - 1 x daily - 3 x weekly - 1 sets - 10 reps Scapular Retraction with Resistance Advanced - 1 x daily - 3 x weekly - 1 sets - 10 reps Shoulder External Rotation and Scapular Retraction with Resistance - 1 x daily - 3 x weekly - 1 sets - 10 reps Standing Shoulder Horizontal Abduction with Resistance - 1 x daily - 3 x weekly - 1 sets - 10 reps Standing Shoulder Flexion to 90 Degrees with Dumbbells - 1 x daily - 7 x weekly - 1 sets - 10 reps Access Code: [email or web address] URL: [email or web address] Date: 08/09/2020 Prepared by: Cheral Almas  Exercises Doorway Pec Stretch at 90 Degrees Abduction - 1 x daily - 7 x weekly - 2 sets - 3 reps - 20 hold Pt was also given instructions for ABC strength class so she could read instructions for warmup stretching etc.

## 2020-08-13 ENCOUNTER — Ambulatory Visit: Payer: BC Managed Care – PPO | Admitting: Plastic Surgery

## 2020-08-23 ENCOUNTER — Encounter: Payer: Self-pay | Admitting: Plastic Surgery

## 2020-08-23 ENCOUNTER — Other Ambulatory Visit: Payer: Self-pay

## 2020-08-23 ENCOUNTER — Ambulatory Visit: Payer: BC Managed Care – PPO | Admitting: Plastic Surgery

## 2020-08-23 ENCOUNTER — Ambulatory Visit: Payer: BC Managed Care – PPO

## 2020-08-23 VITALS — BP 113/77 | HR 82

## 2020-08-23 DIAGNOSIS — M6281 Muscle weakness (generalized): Secondary | ICD-10-CM

## 2020-08-23 DIAGNOSIS — R293 Abnormal posture: Secondary | ICD-10-CM

## 2020-08-23 DIAGNOSIS — M25612 Stiffness of left shoulder, not elsewhere classified: Secondary | ICD-10-CM

## 2020-08-23 DIAGNOSIS — Z483 Aftercare following surgery for neoplasm: Secondary | ICD-10-CM

## 2020-08-23 DIAGNOSIS — Z9889 Other specified postprocedural states: Secondary | ICD-10-CM

## 2020-08-23 DIAGNOSIS — M25611 Stiffness of right shoulder, not elsewhere classified: Secondary | ICD-10-CM | POA: Diagnosis not present

## 2020-08-23 NOTE — Patient Instructions (Addendum)
Pt. Was instructed in and performed all ABC strength exs to check her form.  She was given a new copy of the exercises.  We also discussed her sleeve and gauntlet and she will call today to see if sleeve is available for pick up.  Answered pt questions about progression and exercises

## 2020-08-23 NOTE — Therapy (Signed)
[place], [place], [address] Phone: [phone]   Fax:  [phone]  Physical Therapy Treatment  Patient Details  Name: Stacey Singh MRN: [id number] Date of Birth: 08/09/1975 Referring Provider (PT): Dr. Marla Roe    Encounter Date: 08/23/2020   PT End of Session - 08/23/20 1016    Visit Number 16    Number of Visits 25    Date for PT Re-Evaluation 10/04/20    Authorization - Number of Visits 30    PT Start Time 0903    PT Stop Time 1005    PT Time Calculation (min) 62 min    Activity Tolerance Patient tolerated treatment well    Behavior During Therapy [facility] for tasks assessed/performed           Past Medical History:  Diagnosis Date  . Allergy   . Family history of breast cancer   . Family history of leukemia   . Heart murmur    in high school, resolved  . Heartburn in pregnancy   . Hypertension    with current pregnancy  . Hypertension   . No pertinent past medical history     Past Surgical History:  Procedure Laterality Date  . BREAST RECONSTRUCTION WITH PLACEMENT OF TISSUE EXPANDER AND FLEX HD (ACELLULAR HYDRATED DERMIS) Bilateral 03/13/2020   Procedure: BILATERAL BREAST RECONSTRUCTION WITH PLACEMENT OF TISSUE EXPANDERS AND FLEX HD (ACELLULAR HYDRATED DERMIS);  Surgeon: Wallace Going, DO;  Location: [place];  Service: Plastics;  Laterality: Bilateral;  . CESAREAN SECTION  04/04/2012   Procedure: CESAREAN SECTION;  Surgeon: Farrel Gobble. Harrington Challenger, MD;  Location: [facility];  Service: Obstetrics;  Laterality: N/A;  . CESAREAN SECTION N/A 05/19/2015   Procedure: CESAREAN SECTION;  Surgeon: Allyn Kenner, DO;  Location: [facility];  Service: Obstetrics;  Laterality: N/A;  . CESAREAN SECTION    . DILATION AND CURETTAGE OF UTERUS    . MASTECTOMY W/ SENTINEL NODE BIOPSY Bilateral 03/13/2020   Procedure: BILATERAL MASTECTOMY WITH RIGHT SENTINEL LYMPH NODE BIOPSY;  Surgeon: Jovita Kussmaul, MD;  Location: [place];  Service: General;  Laterality: Bilateral;  PEC BLOCK  . REMOVAL OF BILATERAL TISSUE EXPANDERS WITH PLACEMENT OF BILATERAL BREAST IMPLANTS Bilateral 06/13/2020   Procedure: REMOVAL OF BILATERAL TISSUE EXPANDERS WITH PLACEMENT OF BILATERAL BREAST IMPLANTS;  Surgeon: Wallace Going, DO;  Location: [place];  Service: Plastics;  Laterality: Bilateral;  . WISDOM TOOTH EXTRACTION      There were no vitals filed for this visit.   Subjective Assessment - 08/23/20 0904    Subjective It turns out I had Covid when I thought I had a sinus infection. My chest is feeling better now that I am not coughing. My right chest still gets really tight in the am and I wonder if that will ever go away.  LAX started yesterday so I didn't do any shooting.  Able to do 4-5 days of theraband exs but then I was sick so didn't get to do too much.    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  She has bilateral mastecomy on 03/13/2020 with immediate expanders Plans to have implants on June 13, 2020    Patient Stated Goals to return arms back to normal, to be able to wash her own hair., perform hygiene    Pain Score 0-No pain  [place] Adult PT Treatment/Exercise - 08/23/20 0001      Exercises   Other Exercises  Pt was instructed in ABC strength class warm ups, pre and post stretches and all strengthening exercises.  Pt was educated in and used good form with all, but needed some VC for proper squat form.                  PT Education - 08/23/20 1013    Education Details Pt was educated in ABC strength class exs and performed each exercise.  We also discussed theraband exs she is doing.  She has not been able to do many exercises of late secondary to COVID and snow.  She knows to go back to activity slowly.  We talked about times she should wear her sleeve  including Lax practice, wt lifting, flying or any new heavier activities    Person(s) Educated Patient    Methods Explanation;Demonstration;Handout    Comprehension Verbalized understanding            PT Short Term Goals - 05/30/20 1433      PT SHORT TERM GOAL #1   Time 4    Period Weeks    Status Partially Met             PT Long Term Goals - 08/09/20 2209      PT LONG TERM GOAL #1   Title Pt will have > 150 degrees of right and left shoulder abduction so that she can return to her normal activities    Time 8    Period Weeks    Status Achieved      PT LONG TERM GOAL #2   Title Pt will be inpependent in a HEP  for shoulder ROM and strength    Time 8    Period Weeks    Status Achieved      PT LONG TERM GOAL #3   Title Able to fully brush the back of her hair without limitation    Time 6    Period Weeks    Status Achieved      PT LONG TERM GOAL #4   Title Able to open jars without difficulty    Time 6    Period Weeks    Status Achieved      PT LONG TERM GOAL #5   Title Quick dash no greater than 10%    Time 8    Period Weeks    Status On-going    Target Date 10/04/20      PT LONG TERM GOAL #6   Title Able to perform personal hygiene with right UE with improved ease    Time 5    Period Weeks    Status Achieved      PT LONG TERM GOAL #7   Title Able to turn steering wheel with right upper extremity with improved ease when driving with 1 hand    Period Weeks    Status Achieved      PT LONG TERM GOAL #8   Title Pt will be independent  in and advanced HEP for shoulder strength/stability    Time 8    Period Weeks    Status New    Target Date 10/04/20      PT LONG TERM GOAL  #9   TITLE Pt will be able to lift 10 lb to waist height without difficulty or increased pain    Time 8    Period Weeks  Status New    Target Date 10/04/20      PT LONG TERM GOAL  #10   TITLE Pt will tolerate return to jogging/home activities with 0- minimal chest  discomfort    Time 8    Period Weeks    Status New    Target Date 10/04/20                 Plan - 08/23/20 1016    Clinical Impression Statement Pt used good form throughout most of the exercises performed for the ABC strength class.  She did require VC's and tactile cues for proper squat form and we practiced these to a high table and she did much better.  She has a good understanding of how to progress the exercises.  She was advised to continue the theraband exercises for good posture as she is still very round shoulder.  She was also educated in proper sitting posture at her desk using a lumbar roll    Personal Factors and Comorbidities Comorbidity 2    Comorbidities bilateral mastecomy, expanders with fills    Examination-Activity Limitations Caring for Others;Bend;Reach Overhead;Hygiene/Grooming    Examination-Participation Restrictions Meal Prep;Laundry    Stability/Clinical Decision Making Stable/Uncomplicated    Rehab Potential Excellent    PT Frequency Biweekly    PT Duration 8 weeks    PT Treatment/Interventions ADLs/Self Care Home Management;DME Instruction;Therapeutic activities;Therapeutic exercise;Neuromuscular re-education;Patient/family education;Manual techniques;Manual lymph drainage;Passive range of motion;Scar mobilization    PT Next Visit Plan Pt will schedule appt but may call if doing well and doesn't feel she needs to come. May push appt out a little further.  Review exs prn, stretches, check sleeve/gauntlet    PT Home Exercise Plan scap retraction, supine dowel, supine scap; intermittent end ROM stretching during day in doorway, theraband, Jobes flex and scaption, Elbow flexion, ABC strength    Consulted and Agree with Plan of Care Patient           Patient will benefit from skilled therapeutic intervention in order to improve the following deficits and impairments:  Decreased skin integrity,Increased muscle spasms,Impaired perceived functional  ability,Decreased range of motion,Decreased knowledge of precautions,Decreased activity tolerance,Decreased knowledge of use of DME,Decreased strength,Increased fascial restricitons,Impaired UE functional use,Postural dysfunction,Pain  Visit Diagnosis: Stiffness of right shoulder, not elsewhere classified  Aftercare following surgery for neoplasm  Abnormal posture  Stiffness of left shoulder, not elsewhere classified  Muscle weakness (generalized)     Problem List Patient Active Problem List   Diagnosis Date Noted  . S/P breast reconstruction, bilateral 07/17/2020  . Acquired absence of breast 03/22/2020  . Breast cancer ([place]) 03/13/2020  . Genetic testing 01/05/2020  . Family history of breast cancer   . Family history of leukemia   . 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 Obesity, Class III, BMI 40-49.9 (morbid obesity) ([place]) 12/29/2017  . Benign essential HTN 06/13/2015    Claris Pong 08/23/2020, 10:29 AM  [place], [place], [address] Phone: [phone]   Fax:  [phone]  Name: Stacey Singh MRN: [id number] Date of Birth: Nov 30, 1975 Cheral Almas, PT 08/23/20 10:30 AM

## 2020-08-23 NOTE — Progress Notes (Signed)
   Subjective:    Patient ID: Stacey Singh, female    DOB: Apr 10, 1976, 45 y.o.   MRN: [id number]  The patient is a 45 year old female here for follow-up on her bilateral breast reconstructions. The patient underwent bilateral mastectomies and now has bilateral implants. The implants were placed in November and she has [place] smooth round ultra high profile gel implants 535 cc. She is not happy with the contour of the breasts. There is a lot of excess tissue laterally. She would also like Lipo filling to help improve symmetry. She is otherwise doing very well and no complaints.     Review of Systems  Constitutional: Negative.   HENT: Negative.   Eyes: Negative.   Respiratory: Negative.   Cardiovascular: Negative.   Endocrine: Negative.   Genitourinary: Negative.   Musculoskeletal: Negative.        Objective:   Physical Exam Vitals and nursing note reviewed.  Constitutional:      Appearance: Normal appearance.  HENT:     Head: Normocephalic and atraumatic.  Cardiovascular:     Rate and Rhythm: Normal rate.     Pulses: Normal pulses.  Pulmonary:     Effort: Pulmonary effort is normal.  Abdominal:     General: Abdomen is flat. There is no distension.     Tenderness: There is no abdominal tenderness.  Neurological:     General: No focal deficit present.     Mental Status: She is alert and oriented to person, place, and time.  Psychiatric:        Mood and Affect: Mood normal.        Behavior: Behavior normal.          Assessment & Plan:     ICD-10-CM   1. S/P breast reconstruction, bilateral  Z98.890     The patient is a good candidate for excision of excess bilateral breast tissue and Lipo filling.  Pictures were obtained of the patient and placed in the chart with the patient's or guardian's permission.

## 2020-09-06 ENCOUNTER — Other Ambulatory Visit: Payer: Self-pay

## 2020-09-06 ENCOUNTER — Ambulatory Visit: Payer: BC Managed Care – PPO | Attending: Plastic Surgery

## 2020-09-06 DIAGNOSIS — R293 Abnormal posture: Secondary | ICD-10-CM | POA: Insufficient documentation

## 2020-09-06 DIAGNOSIS — M25612 Stiffness of left shoulder, not elsewhere classified: Secondary | ICD-10-CM | POA: Diagnosis present

## 2020-09-06 DIAGNOSIS — Z483 Aftercare following surgery for neoplasm: Secondary | ICD-10-CM | POA: Insufficient documentation

## 2020-09-06 DIAGNOSIS — M6281 Muscle weakness (generalized): Secondary | ICD-10-CM | POA: Insufficient documentation

## 2020-09-06 DIAGNOSIS — M25611 Stiffness of right shoulder, not elsewhere classified: Secondary | ICD-10-CM | POA: Insufficient documentation

## 2020-09-06 NOTE — Patient Instructions (Signed)
Discussed at length with pt proper way to progress strength by increasing reps first up to 2-3 sets of 10, before increasing resistance, but when resistance is increased to decrease reps.  Pt was advised to review the ABC strength handout given last week since she has not progressed reps at all on her own to gain a better understanding,  Also discussed with pt that after her next surgery she will have to decrease reps/resistance at first before progressing.

## 2020-09-06 NOTE — Therapy (Signed)
[place], [place], [address] Phone: [phone]   Fax:  [phone]  Physical Therapy Treatment  Patient Details  Name: Stacey Singh MRN: [id number] Date of Birth: [birth year] Referring Provider (PT): Dr. Marla Roe    Encounter Date: 09/06/2020   PT End of Session - 09/06/20 1748    Visit Number 17    Number of Visits 25    Date for PT Re-Evaluation 10/04/20    PT Start Time 0902    PT Stop Time 0953    PT Time Calculation (min) 51 min    Activity Tolerance Patient tolerated treatment well    Behavior During Therapy [facility] for tasks assessed/performed           Past Medical History:  Diagnosis Date  . Allergy   . Family history of breast cancer   . Family history of leukemia   . Heart murmur    in high school, resolved  . Heartburn in pregnancy   . Hypertension    with current pregnancy  . Hypertension   . No pertinent past medical history     Past Surgical History:  Procedure Laterality Date  . BREAST RECONSTRUCTION WITH PLACEMENT OF TISSUE EXPANDER AND FLEX HD (ACELLULAR HYDRATED DERMIS) Bilateral 03/13/2020   Procedure: BILATERAL BREAST RECONSTRUCTION WITH PLACEMENT OF TISSUE EXPANDERS AND FLEX HD (ACELLULAR HYDRATED DERMIS);  Surgeon: Wallace Going, DO;  Location: [place];  Service: Plastics;  Laterality: Bilateral;  . CESAREAN SECTION  04/04/2012   Procedure: CESAREAN SECTION;  Surgeon: Farrel Gobble. Harrington Challenger, MD;  Location: [facility];  Service: Obstetrics;  Laterality: N/A;  . CESAREAN SECTION N/A 05/19/2015   Procedure: CESAREAN SECTION;  Surgeon: Allyn Kenner, DO;  Location: [facility];  Service: Obstetrics;  Laterality: N/A;  . CESAREAN SECTION    . DILATION AND CURETTAGE OF UTERUS    . MASTECTOMY W/ SENTINEL NODE BIOPSY Bilateral 03/13/2020   Procedure: BILATERAL MASTECTOMY WITH RIGHT SENTINEL LYMPH NODE BIOPSY;  Surgeon: Jovita Kussmaul, MD;  Location: [place];  Service: General;  Laterality: Bilateral;  PEC BLOCK  . REMOVAL OF BILATERAL TISSUE EXPANDERS WITH PLACEMENT OF BILATERAL BREAST IMPLANTS Bilateral 06/13/2020   Procedure: REMOVAL OF BILATERAL TISSUE EXPANDERS WITH PLACEMENT OF BILATERAL BREAST IMPLANTS;  Surgeon: Wallace Going, DO;  Location: [place];  Service: Plastics;  Laterality: Bilateral;  . WISDOM TOOTH EXTRACTION      There were no vitals filed for this visit.   Subjective Assessment - 09/06/20 0901    Subjective Going to have some extra tissue removed and have some fat grafting done on 10/09/20.  Follow up again with surgeon 10 days after surgery. Have been doing some strengthening  after practice 3 x's per week.   I have been able to warm up the goalie but I can't wear my sleeve because it was too small.  They are suposed to be having it remade.  I still feel discomfort  in right chest lateral to breast and above. I'm not sure if its the implant It doesn't hurt it just doesn't feel right. This weekend I did a walk jog where I jogged for 30 seconds and walked 2 min. at a time.  It felt good. I was using a 1 lb water bottle for exercises and doing 10 reps with all.    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  She has  bilateral mastecomy on 03/13/2020 with immediate expanders Plans to have implants on June 13, 2020                             [facility] Adult PT Treatment/Exercise - 09/06/20 0001      Elbow Exercises   Elbow Flexion Strengthening;Both;15 reps   3 #     Shoulder Exercises: Standing   Horizontal ABduction Strengthening;Both;15 reps    Theraband Level (Shoulder Horizontal ABduction) Level 2 (Red)    External Rotation Strengthening;Both;15 reps    Theraband Level (Shoulder External Rotation) Level 2 (Red)    External Rotation Weight (lbs) also at 90 deg abd yellow x 10    Extension Strengthening;Both;15 reps    Theraband Level  (Shoulder Extension) Level 2 (Red)    Retraction Strengthening;Both;15 reps    Theraband Level (Shoulder Retraction) Level 2 (Red)    Other Standing Exercises Jobes flexion and scaption 1#  15 reps    Other Standing Exercises wall chest stretch   3 ea.                 PT Education - 09/06/20 1746    Education Details Pt. advised to discuss Right breast discomfort with MD before upcoming surgery so MD is aware and can check things when she does the surgery.  Discomfort may be from the way the implant is sitting in her chest.  Pt was also educated in the proper way to progress repetitions first, before increasing resistance and was advised to refer to ABC strength handout.    Person(s) Educated Patient    Methods Explanation;Verbal cues    Comprehension Verbalized understanding;Returned demonstration            PT Short Term Goals - 05/30/20 1433      PT SHORT TERM GOAL #1   Time 4    Period Weeks    Status Partially Met             PT Long Term Goals - 08/09/20 2209      PT LONG TERM GOAL #1   Title Pt will have > 150 degrees of right and left shoulder abduction so that she can return to her normal activities    Time 8    Period Weeks    Status Achieved      PT LONG TERM GOAL #2   Title Pt will be inpependent in a HEP  for shoulder ROM and strength    Time 8    Period Weeks    Status Achieved      PT LONG TERM GOAL #3   Title Able to fully brush the back of her hair without limitation    Time 6    Period Weeks    Status Achieved      PT LONG TERM GOAL #4   Title Able to open jars without difficulty    Time 6    Period Weeks    Status Achieved      PT LONG TERM GOAL #5   Title Quick dash no greater than 10%    Time 8    Period Weeks    Status On-going    Target Date 10/04/20      PT LONG TERM GOAL #6   Title Able to perform personal hygiene with right UE with improved ease    Time 5    Period Weeks    Status Achieved  PT LONG TERM GOAL #7    Title Able to turn steering wheel with right upper extremity with improved ease when driving with 1 hand    Period Weeks    Status Achieved      PT LONG TERM GOAL #8   Title Pt will be independent  in and advanced HEP for shoulder strength/stability    Time 8    Period Weeks    Status New    Target Date 10/04/20      PT LONG TERM GOAL  #9   TITLE Pt will be able to lift 10 lb to waist height without difficulty or increased pain    Time 8    Period Weeks    Status New    Target Date 10/04/20      PT LONG TERM GOAL  #10   TITLE Pt will tolerate return to jogging/home activities with 0- minimal chest discomfort    Time 8    Period Weeks    Status New    Target Date 10/04/20                 Plan - 09/06/20 1750    Clinical Impression Statement Lengthy discussion with pt about right chest discomfort as possibly being from the way implant is sitting and to discuss with MD before next surgery.  Discomfort is mainly at lateral breast and above breast, but does not feel particularly tight per pt.  She is working hard to sit with improved posture.  We increased repetitions today with all exs to 15 reps with good form, but with more difficulty with standing horizontal abduction.  We discussed starting in supine or with a yellow band when done in standing.  We increased biceps curls to 3# with no difficulty.  Pt is having her sleeve remade because it felt too tight in several places so hopes to have it in a week or so and was advised to wear for lacrosse and wt training activities, flying or activities with excessive right UE use.  She will call with questions and may wait and follow up with [place] after her next surgery on 10/09/20    Personal Factors and Comorbidities Comorbidity 2    Comorbidities bilateral mastecomy, expanders with fills    Examination-Activity Limitations Caring for Others;Bend;Reach Overhead;Hygiene/Grooming    Examination-Participation Restrictions Meal Prep;Laundry     Stability/Clinical Decision Making Stable/Uncomplicated    Rehab Potential Excellent    PT Frequency Biweekly    PT Duration 8 weeks    PT Treatment/Interventions ADLs/Self Care Home Management;DME Instruction;Therapeutic activities;Therapeutic exercise;Neuromuscular re-education;Patient/family education;Manual techniques;Manual lymph drainage;Passive range of motion;Scar mobilization    PT Next Visit Plan Pt will call if she has ? or feels she needs to return before her next surgery.  Will have surgery 10/09/20 and will return as allowed by MD    PT Home Exercise Plan scap retraction, supine dowel, supine scap; intermittent end ROM stretching during day in doorway, theraband, Jobes flex and scaption, Elbow flexion, ABC strength    Consulted and Agree with Plan of Care Patient           Patient will benefit from skilled therapeutic intervention in order to improve the following deficits and impairments:  Decreased skin integrity,Increased muscle spasms,Impaired perceived functional ability,Decreased range of motion,Decreased knowledge of precautions,Decreased activity tolerance,Decreased knowledge of use of DME,Decreased strength,Increased fascial restricitons,Impaired UE functional use,Postural dysfunction,Pain  Visit Diagnosis: Stiffness of right shoulder, not elsewhere classified  Aftercare following surgery for neoplasm  Abnormal posture  Stiffness of left shoulder, not elsewhere classified  Muscle weakness (generalized)     Problem List Patient Active Problem List   Diagnosis Date Noted  . S/P breast reconstruction, bilateral 07/17/2020  . Acquired absence of breast 03/22/2020  . Breast cancer ([place]) 03/13/2020  . Genetic testing 01/05/2020  . Family history of breast cancer   . Family history of leukemia   . 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 Obesity, Class III, BMI 40-49.9 (morbid obesity) ([place]) 12/29/2017  . Benign essential HTN 06/13/2015     Claris Pong 09/06/2020, 6:00 PM  [place], [place], [address] Phone: [phone]   Fax:  [phone]  Name: MACARIA BIAS MRN: [id number] Date of Birth: 06-24-1976 Cheral Almas, PT 09/06/20 6:02 PM

## 2020-09-19 ENCOUNTER — Ambulatory Visit (INDEPENDENT_AMBULATORY_CARE_PROVIDER_SITE_OTHER): Payer: BC Managed Care – PPO | Admitting: Surgical

## 2020-09-19 ENCOUNTER — Other Ambulatory Visit: Payer: Self-pay

## 2020-09-19 ENCOUNTER — Encounter: Payer: Self-pay | Admitting: Surgical

## 2020-09-19 VITALS — BP 119/82 | HR 79 | Ht 63.0 in | Wt 240.0 lb

## 2020-09-19 DIAGNOSIS — Z9889 Other specified postprocedural states: Secondary | ICD-10-CM

## 2020-09-19 DIAGNOSIS — Z9013 Acquired absence of bilateral breasts and nipples: Secondary | ICD-10-CM

## 2020-09-19 MED ORDER — ONDANSETRON HCL 4 MG PO TABS: 4.0000 mg | ORAL_TABLET | Freq: Three times a day (TID) | ORAL | 0 refills | Status: DC | PRN

## 2020-09-19 MED ORDER — CIPROFLOXACIN HCL 500 MG PO TABS: 500.0000 mg | ORAL_TABLET | Freq: Two times a day (BID) | ORAL | 0 refills | Status: AC

## 2020-09-19 MED ORDER — HYDROCODONE-ACETAMINOPHEN 5-325 MG PO TABS: 1.0000 | ORAL_TABLET | Freq: Four times a day (QID) | ORAL | 0 refills | Status: AC | PRN

## 2020-09-19 NOTE — H&P (View-Only) (Signed)
Patient ID: Stacey Singh, female    DOB: 04-26-1976, 45 y.o.   MRN: [id number]  Chief Complaint  Patient presents with  . Pre-op Exam      ICD-10-CM   1. S/P breast reconstruction, bilateral  Z98.890   2. 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 S/P bilateral mastectomy  Z90.13      History of Present Illness: Stacey Singh is a 45 y.o.  female  with a history of bilateral mastectomies followed by immediate breast reconstruction with placement of tissue expanders.  She currently has implants in place..  She presents for preoperative evaluation for upcoming procedure, liposuction of abdomen with Lipo filling of bilateral breasts and excision/liposuction of excess lateral breast tissue., scheduled for 10/09/2020 with Dr. Marla Roe.  The patient has not had problems with anesthesia. No history of DVT/PE.  No family history of DVT/PE.  No family or personal history of bleeding or clotting disorders.  Patient is not currently taking any blood thinners.  No history of CVA/MI.   Summary of Previous Visit: Patient underwent bilateral mastectomies followed by bilateral breast implants.  The implants were placed in November and she has Mentor smooth round ultra high profile gel implants that are 535 cc.  She is not happy with the contour of the breast and she has a lot of excess tissue laterally.  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  She has some contour abnormalities of bilateral breasts that bother her, she reports the most significant area that bothers her is along the left lateral breast where she has some scar creases and excess tissue  that protrudes when wearing closed.  She is also bothered by the excess tissue within her axilla.   Past Medical History: Allergies: Allergies  Allergen Reactions  . Penicillins   . Penicillins     Childhood reaction    Current Medications:  Current Outpatient Medications:  .  ciprofloxacin (CIPRO) 500 MG tablet, Take 1 tablet (500 mg total) by mouth 2 (two) times daily for 5 days., Disp: 10 tablet, Rfl: 0 .  diazepam (VALIUM) 2 MG tablet, Take 1 tablet (2 mg total) by mouth every 12 (twelve) hours as needed for muscle spasms., Disp: 10 tablet, Rfl: 0 .  HYDROcodone-acetaminophen (NORCO) 5-325 MG tablet, Take 1 tablet by mouth every 6 (six) hours as needed for up to 5 days for severe pain., Disp: 20 tablet, Rfl: 0 .  olmesartan (BENICAR) 40 MG tablet, Take 1 tablet (40 mg total) by mouth daily., Disp: 90 tablet, Rfl: 3 .  ondansetron (ZOFRAN) 4 MG tablet, Take 1 tablet (4 mg total) by mouth every 8 (eight) hours as needed for nausea or vomiting., Disp: 20 tablet, Rfl: 0 .  triamcinolone (KENALOG) 0.025 % ointment, Apply 1 application topically 2 (two) times daily., Disp: 30 g, Rfl: 0  Past Medical Problems: Past Medical History:  Diagnosis Date  . Allergy   . Family history of breast cancer   . Family history of leukemia   . Heart murmur    in high school, resolved  . Heartburn in pregnancy   . Hypertension  with current pregnancy  . Hypertension   . No pertinent past medical history     Past Surgical History: Past Surgical History:  Procedure Laterality Date  . BREAST RECONSTRUCTION WITH PLACEMENT OF TISSUE EXPANDER AND FLEX HD (ACELLULAR HYDRATED DERMIS) Bilateral 03/13/2020   Procedure: BILATERAL BREAST RECONSTRUCTION WITH PLACEMENT OF TISSUE EXPANDERS AND FLEX HD (ACELLULAR HYDRATED DERMIS);  Surgeon: Wallace Going, DO;  Location: [facility];  Service: Plastics;  Laterality: Bilateral;  . CESAREAN SECTION  04/04/2012   Procedure: CESAREAN SECTION;   Surgeon: Farrel Gobble. Harrington Challenger, MD;  Location: [facility];  Service: Obstetrics;  Laterality: N/A;  . CESAREAN SECTION N/A 05/19/2015   Procedure: CESAREAN SECTION;  Surgeon: Allyn Kenner, DO;  Location: [facility];  Service: Obstetrics;  Laterality: N/A;  . CESAREAN SECTION    . DILATION AND CURETTAGE OF UTERUS    . MASTECTOMY W/ SENTINEL NODE BIOPSY Bilateral 03/13/2020   Procedure: BILATERAL MASTECTOMY WITH RIGHT SENTINEL LYMPH NODE BIOPSY;  Surgeon: Jovita Kussmaul, MD;  Location: [place];  Service: General;  Laterality: Bilateral;  PEC BLOCK  . REMOVAL OF BILATERAL TISSUE EXPANDERS WITH PLACEMENT OF BILATERAL BREAST IMPLANTS Bilateral 06/13/2020   Procedure: REMOVAL OF BILATERAL TISSUE EXPANDERS WITH PLACEMENT OF BILATERAL BREAST IMPLANTS;  Surgeon: Wallace Going, DO;  Location: [facility];  Service: Plastics;  Laterality: Bilateral;  . WISDOM TOOTH EXTRACTION      Social History: Social History   Socioeconomic History  . Marital status: Married    Spouse name: Not on file  . Number of children: Not on file  . Years of education: Not on file  . Highest education level: Not on file  Occupational History  . Not on file  Tobacco Use  . Smoking status: Former Smoker    Quit date: 06/06/2009    Years since quitting: 11.2  . Smokeless tobacco: Never Used  Substance and Sexual Activity  . Alcohol use: Not Currently  . Drug use: No  . Sexual activity: Not on file  Other Topics Concern  . Not on file  Social History Narrative   ** Merged History Encounter **       Social Determinants of Health   Financial Resource Strain: Not on file  Food Insecurity: Not on file  Transportation Needs: Not on file  Physical Activity: Not on file  Stress: Not on file  Social Connections: Not on file  Intimate Partner Violence: Not on file    Family History: Family History  Problem Relation Age of Onset  . Hypertension Mother   . Breast cancer Mother 80        negative genetic testing  . Heart disease Father   . Hypertension Sister   . Hypertension Brother   . Diabetes Brother   . Breast cancer Maternal Grandmother        dx. in her 54s  . Heart disease Maternal Grandfather   . Heart Problems Paternal Grandmother   . Heart Problems Paternal Grandfather   . Breast cancer Paternal Aunt        dx. in her 105s  . Breast cancer Other        dx. in her 68s or 41s (MGM's sister)  . Kidney disease Maternal Uncle   . Leukemia Paternal Aunt 11    Review of Systems: Review of Systems  Constitutional: Negative.   Respiratory: Negative.   Cardiovascular: Negative.   Gastrointestinal: Negative.   Musculoskeletal: Positive for myalgias (Right breast).  Neurological: Negative.     Physical Exam: Vital Signs BP 119/82 (BP Location: Left Arm, Patient Position: Sitting, Cuff Size: Large)   Pulse 79   Ht 5' 3"  (1.6 m)   Wt 240 lb (108.9 kg)   SpO2 100%   BMI 42.51 kg/m   Physical Exam Constitutional:      General: Not in acute distress.    Appearance: Normal appearance. Not ill-appearing.  HENT:     Head: Normocephalic and atraumatic.  Eyes:     Pupils: Pupils are equal, round Neck:     Musculoskeletal: Normal range of motion.  Cardiovascular:     Rate and Rhythm: Normal rate and regular rhythm.     Pulses: Normal pulses.     Heart sounds: Normal heart sounds. No murmur.  Pulmonary:     Effort: Pulmonary effort is normal. No respiratory distress.     Breath sounds: Normal breath sounds. No wheezing.  Abdominal:     General: Abdomen is flat. There is no distension.     Palpations: Abdomen is soft.     Tenderness: There is no abdominal tenderness.  Musculoskeletal: Normal range of motion.  Skin:    General: Skin is warm and dry.     Findings: No erythema or rash.  Neurological:     General: No focal deficit present.     Mental Status: Alert and oriented to person, place, and time. Mental status is at baseline.     Motor: No  weakness.  Psychiatric:        Mood and Affect: Mood normal.        Behavior: Behavior normal.    Assessment/Plan: The patient is scheduled for liposuction with lipofilling of bilateral breasts, liposuction/excision of excess lateral breast tissue with Dr. Marla Roe.  Risks, benefits, and alternatives of procedure discussed, questions answered and consent obtained.    Smoking Status: Non-smoker, quit 12 years ago.; Counseling Given?  N/A Last Mammogram: Status post bilateral mastectomy  Caprini Score: 7, high; Risk Factors include: Age, BMI greater than 40, history of breast cancer and length of planned surgery. Recommendation for mechanical and pharmacological prophylaxis. Encourage early ambulation.   Pictures obtained: At previous visit  Post-op Rx sent to pharmacy: Norco, Zofran, Cipro  Patient was provided with the General Surgical Risk consent document and Pain Medication Agreement prior to their appointment.  They had adequate time to read through the risk consent documents and Pain Medication Agreement. We also discussed them in person together during this preop appointment. All of their questions were answered to their satisfaction.  Recommended calling if they have any further questions.  Risk consent form and Pain Medication Agreement to be scanned into patient's chart.  The risks that can be encountered with and after liposuction were discussed and include the following but no limited to these:  Asymmetry, fluid accumulation, firmness of the area, fat necrosis with death of fat tissue, bleeding, infection, delayed healing, anesthesia risks, skin sensation changes, injury to structures including nerves, blood vessels, and muscles which may be temporary or permanent, allergies to tape, suture materials and glues, blood products, topical preparations or injected agents, skin and contour irregularities, skin discoloration and swelling, deep vein thrombosis, cardiac and pulmonary  complications, pain, which may persist, persistent pain, recurrence of the lesion, poor healing of the incision, possible need for revisional surgery or staged procedures. Thiere can also be persistent swelling, poor wound healing, rippling or loose skin, worsening of cellulite, swelling, and thermal burn or heat injury from  ultrasound with the ultrasound-assisted lipoplasty technique. Any change in weight fluctuations can alter the outcome.  We discussed expected incisions along her abdomen and bilateral breasts.  We discussed postoperative restrictions including avoiding strenuous activity or heavy lifting.  Patient is in agreement with this plan and understanding of restrictions.   Electronically signed by: Carola Rhine Scheeler, PA-C 09/19/2020 9:11 AM

## 2020-09-19 NOTE — Progress Notes (Signed)
Patient ID: Stacey Singh, female    DOB: Nov 29, 1975, 45 y.o.   MRN: [id number]  Chief Complaint  Patient presents with  . Pre-op Exam      ICD-10-CM   1. S/P breast reconstruction, bilateral  Z98.890   2. 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 S/P bilateral mastectomy  Z90.13      History of Present Illness: Stacey Singh is a 45 y.o.  female  with a history of bilateral mastectomies followed by immediate breast reconstruction with placement of tissue expanders.  She currently has implants in place..  She presents for preoperative evaluation for upcoming procedure, liposuction of abdomen with Lipo filling of bilateral breasts and excision/liposuction of excess lateral breast tissue., scheduled for 10/09/2020 with Dr. Marla Roe.  The patient has not had problems with anesthesia. No history of DVT/PE.  No family history of DVT/PE.  No family or personal history of bleeding or clotting disorders.  Patient is not currently taking any blood thinners.  No history of CVA/MI.   Summary of Previous Visit: Patient underwent bilateral mastectomies followed by bilateral breast implants.  The implants were placed in November and she has Mentor smooth round ultra high profile gel implants that are 535 cc.  She is not happy with the contour of the breast and she has a lot of excess tissue laterally.  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  She has some contour abnormalities of bilateral breasts that bother her, she reports the most significant area that bothers her is along the left lateral breast where she has some scar creases and excess tissue  that protrudes when wearing closed.  She is also bothered by the excess tissue within her axilla.   Past Medical History: Allergies: Allergies  Allergen Reactions  . Penicillins   . Penicillins     Childhood reaction    Current Medications:  Current Outpatient Medications:  .  ciprofloxacin (CIPRO) 500 MG tablet, Take 1 tablet (500 mg total) by mouth 2 (two) times daily for 5 days., Disp: 10 tablet, Rfl: 0 .  diazepam (VALIUM) 2 MG tablet, Take 1 tablet (2 mg total) by mouth every 12 (twelve) hours as needed for muscle spasms., Disp: 10 tablet, Rfl: 0 .  HYDROcodone-acetaminophen (NORCO) 5-325 MG tablet, Take 1 tablet by mouth every 6 (six) hours as needed for up to 5 days for severe pain., Disp: 20 tablet, Rfl: 0 .  olmesartan (BENICAR) 40 MG tablet, Take 1 tablet (40 mg total) by mouth daily., Disp: 90 tablet, Rfl: 3 .  ondansetron (ZOFRAN) 4 MG tablet, Take 1 tablet (4 mg total) by mouth every 8 (eight) hours as needed for nausea or vomiting., Disp: 20 tablet, Rfl: 0 .  triamcinolone (KENALOG) 0.025 % ointment, Apply 1 application topically 2 (two) times daily., Disp: 30 g, Rfl: 0  Past Medical Problems: Past Medical History:  Diagnosis Date  . Allergy   . Family history of breast cancer   . Family history of leukemia   . Heart murmur    in high school, resolved  . Heartburn in pregnancy   . Hypertension  with current pregnancy  . Hypertension   . No pertinent past medical history     Past Surgical History: Past Surgical History:  Procedure Laterality Date  . BREAST RECONSTRUCTION WITH PLACEMENT OF TISSUE EXPANDER AND FLEX HD (ACELLULAR HYDRATED DERMIS) Bilateral 03/13/2020   Procedure: BILATERAL BREAST RECONSTRUCTION WITH PLACEMENT OF TISSUE EXPANDERS AND FLEX HD (ACELLULAR HYDRATED DERMIS);  Surgeon: Wallace Going, DO;  Location: [facility];  Service: Plastics;  Laterality: Bilateral;  . CESAREAN SECTION  04/04/2012   Procedure: CESAREAN SECTION;   Surgeon: Farrel Gobble. Harrington Challenger, MD;  Location: [facility];  Service: Obstetrics;  Laterality: N/A;  . CESAREAN SECTION N/A 05/19/2015   Procedure: CESAREAN SECTION;  Surgeon: Allyn Kenner, DO;  Location: [facility];  Service: Obstetrics;  Laterality: N/A;  . CESAREAN SECTION    . DILATION AND CURETTAGE OF UTERUS    . MASTECTOMY W/ SENTINEL NODE BIOPSY Bilateral 03/13/2020   Procedure: BILATERAL MASTECTOMY WITH RIGHT SENTINEL LYMPH NODE BIOPSY;  Surgeon: Jovita Kussmaul, MD;  Location: [place];  Service: General;  Laterality: Bilateral;  PEC BLOCK  . REMOVAL OF BILATERAL TISSUE EXPANDERS WITH PLACEMENT OF BILATERAL BREAST IMPLANTS Bilateral 06/13/2020   Procedure: REMOVAL OF BILATERAL TISSUE EXPANDERS WITH PLACEMENT OF BILATERAL BREAST IMPLANTS;  Surgeon: Wallace Going, DO;  Location: [facility];  Service: Plastics;  Laterality: Bilateral;  . WISDOM TOOTH EXTRACTION      Social History: Social History   Socioeconomic History  . Marital status: Married    Spouse name: Not on file  . Number of children: Not on file  . Years of education: Not on file  . Highest education level: Not on file  Occupational History  . Not on file  Tobacco Use  . Smoking status: Former Smoker    Quit date: 06/06/2009    Years since quitting: 11.2  . Smokeless tobacco: Never Used  Substance and Sexual Activity  . Alcohol use: Not Currently  . Drug use: No  . Sexual activity: Not on file  Other Topics Concern  . Not on file  Social History Narrative   ** Merged History Encounter **       Social Determinants of Health   Financial Resource Strain: Not on file  Food Insecurity: Not on file  Transportation Needs: Not on file  Physical Activity: Not on file  Stress: Not on file  Social Connections: Not on file  Intimate Partner Violence: Not on file    Family History: Family History  Problem Relation Age of Onset  . Hypertension Mother   . Breast cancer Mother 37        negative genetic testing  . Heart disease Father   . Hypertension Sister   . Hypertension Brother   . Diabetes Brother   . Breast cancer Maternal Grandmother        dx. in her 86s  . Heart disease Maternal Grandfather   . Heart Problems Paternal Grandmother   . Heart Problems Paternal Grandfather   . Breast cancer Paternal Aunt        dx. in her 72s  . Breast cancer Other        dx. in her 100s or 46s (MGM's sister)  . Kidney disease Maternal Uncle   . Leukemia Paternal Aunt 31    Review of Systems: Review of Systems  Constitutional: Negative.   Respiratory: Negative.   Cardiovascular: Negative.   Gastrointestinal: Negative.   Musculoskeletal: Positive for myalgias (Right breast).  Neurological: Negative.     Physical Exam: Vital Signs BP 119/82 (BP Location: Left Arm, Patient Position: Sitting, Cuff Size: Large)   Pulse 79   Ht 5' 3"  (1.6 m)   Wt 240 lb (108.9 kg)   SpO2 100%   BMI 42.51 kg/m   Physical Exam Constitutional:      General: Not in acute distress.    Appearance: Normal appearance. Not ill-appearing.  HENT:     Head: Normocephalic and atraumatic.  Eyes:     Pupils: Pupils are equal, round Neck:     Musculoskeletal: Normal range of motion.  Cardiovascular:     Rate and Rhythm: Normal rate and regular rhythm.     Pulses: Normal pulses.     Heart sounds: Normal heart sounds. No murmur.  Pulmonary:     Effort: Pulmonary effort is normal. No respiratory distress.     Breath sounds: Normal breath sounds. No wheezing.  Abdominal:     General: Abdomen is flat. There is no distension.     Palpations: Abdomen is soft.     Tenderness: There is no abdominal tenderness.  Musculoskeletal: Normal range of motion.  Skin:    General: Skin is warm and dry.     Findings: No erythema or rash.  Neurological:     General: No focal deficit present.     Mental Status: Alert and oriented to person, place, and time. Mental status is at baseline.     Motor: No  weakness.  Psychiatric:        Mood and Affect: Mood normal.        Behavior: Behavior normal.    Assessment/Plan: The patient is scheduled for liposuction with lipofilling of bilateral breasts, liposuction/excision of excess lateral breast tissue with Dr. Marla Roe.  Risks, benefits, and alternatives of procedure discussed, questions answered and consent obtained.    Smoking Status: Non-smoker, quit 12 years ago.; Counseling Given?  N/A Last Mammogram: Status post bilateral mastectomy  Caprini Score: 7, high; Risk Factors include: Age, BMI greater than 40, history of breast cancer and length of planned surgery. Recommendation for mechanical and pharmacological prophylaxis. Encourage early ambulation.   Pictures obtained: At previous visit  Post-op Rx sent to pharmacy: Norco, Zofran, Cipro  Patient was provided with the General Surgical Risk consent document and Pain Medication Agreement prior to their appointment.  They had adequate time to read through the risk consent documents and Pain Medication Agreement. We also discussed them in person together during this preop appointment. All of their questions were answered to their satisfaction.  Recommended calling if they have any further questions.  Risk consent form and Pain Medication Agreement to be scanned into patient's chart.  The risks that can be encountered with and after liposuction were discussed and include the following but no limited to these:  Asymmetry, fluid accumulation, firmness of the area, fat necrosis with death of fat tissue, bleeding, infection, delayed healing, anesthesia risks, skin sensation changes, injury to structures including nerves, blood vessels, and muscles which may be temporary or permanent, allergies to tape, suture materials and glues, blood products, topical preparations or injected agents, skin and contour irregularities, skin discoloration and swelling, deep vein thrombosis, cardiac and pulmonary  complications, pain, which may persist, persistent pain, recurrence of the lesion, poor healing of the incision, possible need for revisional surgery or staged procedures. Thiere can also be persistent swelling, poor wound healing, rippling or loose skin, worsening of cellulite, swelling, and thermal burn or heat injury from  ultrasound with the ultrasound-assisted lipoplasty technique. Any change in weight fluctuations can alter the outcome.  We discussed expected incisions along her abdomen and bilateral breasts.  We discussed postoperative restrictions including avoiding strenuous activity or heavy lifting.  Patient is in agreement with this plan and understanding of restrictions.   Electronically signed by: Carola Rhine Scheeler, PA-C 09/19/2020 9:11 AM

## 2020-10-01 ENCOUNTER — Encounter (HOSPITAL_BASED_OUTPATIENT_CLINIC_OR_DEPARTMENT_OTHER): Payer: Self-pay | Admitting: Plastic Surgery

## 2020-10-02 ENCOUNTER — Other Ambulatory Visit: Payer: Self-pay

## 2020-10-02 ENCOUNTER — Encounter (HOSPITAL_BASED_OUTPATIENT_CLINIC_OR_DEPARTMENT_OTHER): Payer: Self-pay | Admitting: Plastic Surgery

## 2020-10-04 ENCOUNTER — Encounter (HOSPITAL_BASED_OUTPATIENT_CLINIC_OR_DEPARTMENT_OTHER)
Admission: RE | Admit: 2020-10-04 | Discharge: 2020-10-04 | Disposition: A | Discharge status: Home / Self Care | Payer: BC Managed Care – PPO | Source: Ambulatory Visit | Attending: Plastic Surgery | Admitting: Plastic Surgery

## 2020-10-04 DIAGNOSIS — Z01812 Encounter for preprocedural laboratory examination: Secondary | ICD-10-CM | POA: Insufficient documentation

## 2020-10-04 LAB — POCT PREGNANCY, URINE: Preg Test, Ur: NEGATIVE

## 2020-10-09 ENCOUNTER — Encounter (HOSPITAL_BASED_OUTPATIENT_CLINIC_OR_DEPARTMENT_OTHER)
Admission: RE | Disposition: A | Discharge status: Home / Self Care | Payer: Self-pay | Source: Home / Self Care | Attending: Plastic Surgery

## 2020-10-09 ENCOUNTER — Other Ambulatory Visit: Payer: Self-pay

## 2020-10-09 ENCOUNTER — Ambulatory Visit (HOSPITAL_BASED_OUTPATIENT_CLINIC_OR_DEPARTMENT_OTHER): Payer: BC Managed Care – PPO | Admitting: Anesthesiology

## 2020-10-09 ENCOUNTER — Encounter (HOSPITAL_BASED_OUTPATIENT_CLINIC_OR_DEPARTMENT_OTHER): Payer: Self-pay | Admitting: Plastic Surgery

## 2020-10-09 ENCOUNTER — Ambulatory Visit (HOSPITAL_BASED_OUTPATIENT_CLINIC_OR_DEPARTMENT_OTHER)
Admission: RE | Admit: 2020-10-09 | Discharge: 2020-10-09 | Disposition: A | Discharge status: Home / Self Care | Payer: BC Managed Care – PPO | Attending: Plastic Surgery | Admitting: Plastic Surgery

## 2020-10-09 DIAGNOSIS — Z803 Family history of malignant neoplasm of breast: Secondary | ICD-10-CM | POA: Insufficient documentation

## 2020-10-09 DIAGNOSIS — Z8249 Family history of ischemic heart disease and other diseases of the circulatory system: Secondary | ICD-10-CM | POA: Diagnosis not present

## 2020-10-09 DIAGNOSIS — Z87891 Personal history of nicotine dependence: Secondary | ICD-10-CM | POA: Insufficient documentation

## 2020-10-09 DIAGNOSIS — Z853 Personal history of malignant neoplasm of breast: Secondary | ICD-10-CM | POA: Insufficient documentation

## 2020-10-09 DIAGNOSIS — N6489 Other specified disorders of breast: Secondary | ICD-10-CM | POA: Diagnosis present

## 2020-10-09 DIAGNOSIS — Z806 Family history of leukemia: Secondary | ICD-10-CM | POA: Insufficient documentation

## 2020-10-09 DIAGNOSIS — Z79899 Other long term (current) drug therapy: Secondary | ICD-10-CM | POA: Insufficient documentation

## 2020-10-09 DIAGNOSIS — N651 Disproportion of reconstructed breast: Secondary | ICD-10-CM

## 2020-10-09 DIAGNOSIS — I10 Essential (primary) hypertension: Secondary | ICD-10-CM | POA: Diagnosis not present

## 2020-10-09 DIAGNOSIS — Z833 Family history of diabetes mellitus: Secondary | ICD-10-CM | POA: Diagnosis not present

## 2020-10-09 DIAGNOSIS — Z880 Allergy status to penicillin: Secondary | ICD-10-CM | POA: Diagnosis not present

## 2020-10-09 DIAGNOSIS — Z9013 Acquired absence of bilateral breasts and nipples: Secondary | ICD-10-CM | POA: Diagnosis not present

## 2020-10-09 DIAGNOSIS — Z841 Family history of disorders of kidney and ureter: Secondary | ICD-10-CM | POA: Insufficient documentation

## 2020-10-09 DIAGNOSIS — Z978 Presence of other specified devices: Secondary | ICD-10-CM | POA: Diagnosis not present

## 2020-10-09 HISTORY — DX: Malignant (primary) neoplasm, unspecified: C80.1

## 2020-10-09 HISTORY — PX: LIPOSUCTION WITH LIPOFILLING: SHX6436

## 2020-10-09 HISTORY — PX: BREAST CYST EXCISION: SHX579

## 2020-10-09 LAB — POCT PREGNANCY, URINE: Preg Test, Ur: NEGATIVE

## 2020-10-09 SURGERY — LIPOSUCTION, WITH FAT TRANSFER
Anesthesia: General | Site: Breast | Laterality: Bilateral

## 2020-10-09 MED ORDER — EPHEDRINE SULFATE 50 MG/ML IJ SOLN
INTRAMUSCULAR | Status: DC | PRN
  Administered 2020-10-09: 10 mg via INTRAVENOUS

## 2020-10-09 MED ORDER — EPINEPHRINE PF 1 MG/ML IJ SOLN
INTRAMUSCULAR | Status: AC
  Filled 2020-10-09: qty 1

## 2020-10-09 MED ORDER — SUGAMMADEX SODIUM 500 MG/5ML IV SOLN
INTRAVENOUS | Status: AC
  Filled 2020-10-09: qty 5

## 2020-10-09 MED ORDER — LIDOCAINE 2% (20 MG/ML) 5 ML SYRINGE
INTRAMUSCULAR | Status: AC
  Filled 2020-10-09: qty 5

## 2020-10-09 MED ORDER — ROCURONIUM BROMIDE 100 MG/10ML IV SOLN
INTRAVENOUS | Status: DC | PRN
  Administered 2020-10-09: 50 mg via INTRAVENOUS

## 2020-10-09 MED ORDER — PHENYLEPHRINE 40 MCG/ML (10ML) SYRINGE FOR IV PUSH (FOR BLOOD PRESSURE SUPPORT)
PREFILLED_SYRINGE | INTRAVENOUS | Status: AC
  Filled 2020-10-09: qty 10

## 2020-10-09 MED ORDER — ONDANSETRON HCL 4 MG/2ML IJ SOLN
INTRAMUSCULAR | Status: AC
  Filled 2020-10-09: qty 2

## 2020-10-09 MED ORDER — SCOPOLAMINE 1 MG/3DAYS TD PT72
1.0000 | MEDICATED_PATCH | TRANSDERMAL | Status: DC
  Administered 2020-10-09: 1.5 mg via TRANSDERMAL

## 2020-10-09 MED ORDER — LIDOCAINE HCL (CARDIAC) PF 100 MG/5ML IV SOSY
PREFILLED_SYRINGE | INTRAVENOUS | Status: DC | PRN
  Administered 2020-10-09: 60 mg via INTRAVENOUS

## 2020-10-09 MED ORDER — LACTATED RINGERS IV SOLN: INTRAVENOUS | Status: DC

## 2020-10-09 MED ORDER — ACETAMINOPHEN 500 MG PO TABS
ORAL_TABLET | ORAL | Status: AC
  Filled 2020-10-09: qty 2

## 2020-10-09 MED ORDER — DIPHENHYDRAMINE HCL 50 MG/ML IJ SOLN
INTRAMUSCULAR | Status: AC
  Filled 2020-10-09: qty 1

## 2020-10-09 MED ORDER — HYDROMORPHONE HCL 1 MG/ML IJ SOLN: 0.2500 mg | INTRAMUSCULAR | Status: DC | PRN

## 2020-10-09 MED ORDER — FENTANYL CITRATE (PF) 100 MCG/2ML IJ SOLN
INTRAMUSCULAR | Status: AC
  Filled 2020-10-09: qty 2

## 2020-10-09 MED ORDER — SCOPOLAMINE 1 MG/3DAYS TD PT72
MEDICATED_PATCH | TRANSDERMAL | Status: AC
  Filled 2020-10-09: qty 1

## 2020-10-09 MED ORDER — PROPOFOL 10 MG/ML IV BOLUS
INTRAVENOUS | Status: DC | PRN
  Administered 2020-10-09: 200 mg via INTRAVENOUS

## 2020-10-09 MED ORDER — LIDOCAINE HCL 1 % IJ SOLN
INTRAVENOUS | Status: DC | PRN
  Administered 2020-10-09: 1000 mL

## 2020-10-09 MED ORDER — SUFENTANIL CITRATE 50 MCG/ML IV SOLN
INTRAVENOUS | Status: AC
  Filled 2020-10-09: qty 1

## 2020-10-09 MED ORDER — EPHEDRINE 5 MG/ML INJ
INTRAVENOUS | Status: AC
  Filled 2020-10-09: qty 10

## 2020-10-09 MED ORDER — LIDOCAINE-EPINEPHRINE 1 %-1:100000 IJ SOLN
INTRAMUSCULAR | Status: AC
  Filled 2020-10-09: qty 1

## 2020-10-09 MED ORDER — SUFENTANIL CITRATE 50 MCG/ML IV SOLN
INTRAVENOUS | Status: DC | PRN
  Administered 2020-10-09 (×3): 10 ug via INTRAVENOUS

## 2020-10-09 MED ORDER — CHLORHEXIDINE GLUCONATE CLOTH 2 % EX PADS: 6.0000 | MEDICATED_PAD | Freq: Once | CUTANEOUS | Status: DC

## 2020-10-09 MED ORDER — MIDAZOLAM HCL 2 MG/2ML IJ SOLN
INTRAMUSCULAR | Status: AC
  Filled 2020-10-09: qty 2

## 2020-10-09 MED ORDER — SODIUM CHLORIDE 0.9% FLUSH: 3.0000 mL | INTRAVENOUS | Status: DC | PRN

## 2020-10-09 MED ORDER — SUCCINYLCHOLINE CHLORIDE 200 MG/10ML IV SOSY
PREFILLED_SYRINGE | INTRAVENOUS | Status: AC
  Filled 2020-10-09: qty 10

## 2020-10-09 MED ORDER — DEXAMETHASONE SODIUM PHOSPHATE 10 MG/ML IJ SOLN
INTRAMUSCULAR | Status: AC
  Filled 2020-10-09: qty 1

## 2020-10-09 MED ORDER — BUPIVACAINE HCL (PF) 0.25 % IJ SOLN
INTRAMUSCULAR | Status: AC
  Filled 2020-10-09: qty 30

## 2020-10-09 MED ORDER — ROCURONIUM BROMIDE 10 MG/ML (PF) SYRINGE
PREFILLED_SYRINGE | INTRAVENOUS | Status: AC
  Filled 2020-10-09: qty 10

## 2020-10-09 MED ORDER — LIDOCAINE-EPINEPHRINE 1 %-1:100000 IJ SOLN
INTRAMUSCULAR | Status: DC | PRN
  Administered 2020-10-09: 30 mL

## 2020-10-09 MED ORDER — SODIUM CHLORIDE 0.9 % IV SOLN: 250.0000 mL | INTRAVENOUS | Status: DC | PRN

## 2020-10-09 MED ORDER — SUGAMMADEX SODIUM 200 MG/2ML IV SOLN
INTRAVENOUS | Status: DC | PRN
Start: 2020-10-09 — End: 2020-10-09
  Administered 2020-10-09: 300 mg via INTRAVENOUS

## 2020-10-09 MED ORDER — OXYCODONE HCL 5 MG PO TABS: 5.0000 mg | ORAL_TABLET | ORAL | Status: DC | PRN

## 2020-10-09 MED ORDER — FENTANYL CITRATE (PF) 100 MCG/2ML IJ SOLN: 25.0000 ug | INTRAMUSCULAR | Status: DC | PRN

## 2020-10-09 MED ORDER — SODIUM CHLORIDE 0.9% FLUSH: 3.0000 mL | Freq: Two times a day (BID) | INTRAVENOUS | Status: DC

## 2020-10-09 MED ORDER — ACETAMINOPHEN 500 MG PO TABS
1000.0000 mg | ORAL_TABLET | Freq: Once | ORAL | Status: AC
  Administered 2020-10-09: 1000 mg via ORAL

## 2020-10-09 MED ORDER — DEXAMETHASONE SODIUM PHOSPHATE 4 MG/ML IJ SOLN
INTRAMUSCULAR | Status: DC | PRN
  Administered 2020-10-09: 10 mg via INTRAVENOUS

## 2020-10-09 MED ORDER — CIPROFLOXACIN IN D5W 400 MG/200ML IV SOLN
400.0000 mg | INTRAVENOUS | Status: AC
  Administered 2020-10-09: 400 mg via INTRAVENOUS

## 2020-10-09 MED ORDER — LIDOCAINE HCL (PF) 1 % IJ SOLN
INTRAMUSCULAR | Status: AC
  Filled 2020-10-09: qty 60

## 2020-10-09 MED ORDER — CIPROFLOXACIN IN D5W 400 MG/200ML IV SOLN
INTRAVENOUS | Status: AC
  Filled 2020-10-09: qty 200

## 2020-10-09 MED ORDER — MIDAZOLAM HCL 5 MG/5ML IJ SOLN
INTRAMUSCULAR | Status: DC | PRN
  Administered 2020-10-09: 2 mg via INTRAVENOUS

## 2020-10-09 MED ORDER — ACETAMINOPHEN 325 MG RE SUPP: 650.0000 mg | RECTAL | Status: DC | PRN

## 2020-10-09 MED ORDER — ACETAMINOPHEN 325 MG PO TABS: 650.0000 mg | ORAL_TABLET | ORAL | Status: DC | PRN

## 2020-10-09 SURGICAL SUPPLY — 57 items
BINDER ABDOMINAL  9 SM 30-45 (SOFTGOODS)
BINDER ABDOMINAL 10 UNV 27-48 (MISCELLANEOUS) IMPLANT
BINDER ABDOMINAL 12 SM 30-45 (SOFTGOODS) IMPLANT
BINDER ABDOMINAL 12 XL 75-84 (SOFTGOODS) ×2 IMPLANT
BINDER ABDOMINAL 9 SM 30-45 (SOFTGOODS) IMPLANT
BINDER BREAST 3XL (GAUZE/BANDAGES/DRESSINGS) ×2 IMPLANT
BINDER BREAST LRG (GAUZE/BANDAGES/DRESSINGS) IMPLANT
BINDER BREAST MEDIUM (GAUZE/BANDAGES/DRESSINGS) IMPLANT
BINDER BREAST XLRG (GAUZE/BANDAGES/DRESSINGS) IMPLANT
BINDER BREAST XXLRG (GAUZE/BANDAGES/DRESSINGS) IMPLANT
BLADE HEX COATED 2.75 (ELECTRODE) IMPLANT
BLADE SURG 15 STRL LF DISP TIS (BLADE) ×1 IMPLANT
BLADE SURG 15 STRL SS (BLADE) ×1
COVER BACK TABLE 60X90IN (DRAPES) ×2 IMPLANT
COVER MAYO STAND STRL (DRAPES) ×2 IMPLANT
COVER WAND RF STERILE (DRAPES) IMPLANT
DECANTER SPIKE VIAL GLASS SM (MISCELLANEOUS) IMPLANT
DERMABOND ADVANCED (GAUZE/BANDAGES/DRESSINGS) ×2
DERMABOND ADVANCED .7 DNX12 (GAUZE/BANDAGES/DRESSINGS) ×2 IMPLANT
DRAPE LAPAROSCOPIC ABDOMINAL (DRAPES) ×2 IMPLANT
DRSG OPSITE POSTOP 4X6 (GAUZE/BANDAGES/DRESSINGS) ×6 IMPLANT
DRSG PAD ABDOMINAL 8X10 ST (GAUZE/BANDAGES/DRESSINGS) ×4 IMPLANT
ELECT REM PT RETURN 9FT ADLT (ELECTROSURGICAL) ×2
ELECTRODE REM PT RTRN 9FT ADLT (ELECTROSURGICAL) ×1 IMPLANT
EXTRACTOR CANIST REVOLVE STRL (CANNISTER) ×4 IMPLANT
GLOVE SURG ENC MOIS LTX SZ6.5 (GLOVE) ×6 IMPLANT
GLOVE SURG POLYISO LF SZ6.5 (GLOVE) ×2 IMPLANT
GLOVE SURG UNDER POLY LF SZ6.5 (GLOVE) ×2 IMPLANT
GLOVE SURG UNDER POLY LF SZ7 (GLOVE) ×2 IMPLANT
GOWN STRL REUS W/ TWL LRG LVL3 (GOWN DISPOSABLE) ×3 IMPLANT
GOWN STRL REUS W/TWL LRG LVL3 (GOWN DISPOSABLE) ×3
IV LACTATED RINGERS 1000ML (IV SOLUTION) ×6 IMPLANT
LINER CANISTER 1000CC FLEX (MISCELLANEOUS) ×6 IMPLANT
NDL SAFETY ECLIPSE 18X1.5 (NEEDLE) ×1 IMPLANT
NEEDLE HYPO 18GX1.5 SHARP (NEEDLE) ×1
NEEDLE HYPO 25X1 1.5 SAFETY (NEEDLE) ×2 IMPLANT
PACK BASIN DAY SURGERY FS (CUSTOM PROCEDURE TRAY) ×2 IMPLANT
PAD ALCOHOL SWAB (MISCELLANEOUS) ×2 IMPLANT
PAD FOAM SILICONE BACKED (GAUZE/BANDAGES/DRESSINGS) ×4 IMPLANT
PENCIL SMOKE EVACUATOR (MISCELLANEOUS) ×2 IMPLANT
SLEEVE SCD COMPRESS KNEE MED (STOCKING) ×2 IMPLANT
SPONGE LAP 18X18 RF (DISPOSABLE) ×4 IMPLANT
STRIP CLOSURE SKIN 1/2X4 (GAUZE/BANDAGES/DRESSINGS) ×2 IMPLANT
SUT MNCRL AB 4-0 PS2 18 (SUTURE) ×4 IMPLANT
SUT MON AB 3-0 SH 27 (SUTURE) ×2
SUT MON AB 3-0 SH27 (SUTURE) ×2 IMPLANT
SUT MON AB 5-0 PS2 18 (SUTURE) ×4 IMPLANT
SYR 10ML LL (SYRINGE) ×8 IMPLANT
SYR 3ML 18GX1 1/2 (SYRINGE) ×2 IMPLANT
SYR 50ML LL SCALE MARK (SYRINGE) ×2 IMPLANT
SYR CONTROL 10ML LL (SYRINGE) ×2 IMPLANT
SYR TOOMEY 50ML (SYRINGE) ×2 IMPLANT
TOWEL GREEN STERILE FF (TOWEL DISPOSABLE) ×4 IMPLANT
TRAY DSU PREP LF (CUSTOM PROCEDURE TRAY) ×2 IMPLANT
TUBING INFILTRATION IT-10001 (TUBING) ×2 IMPLANT
TUBING SET GRADUATE ASPIR 12FT (MISCELLANEOUS) ×2 IMPLANT
UNDERPAD 30X36 HEAVY ABSORB (UNDERPADS AND DIAPERS) ×4 IMPLANT

## 2020-10-09 NOTE — Op Note (Signed)
DATE OF OPERATION: 10/09/2020  LOCATION: [facility]  PREOPERATIVE DIAGNOSIS: Bilateral breast asymmetry  POSTOPERATIVE DIAGNOSIS: Same  PROCEDURE:  1.  Excision of right lateral breast tissue 3 x 6 cm 2.  Excision of left lateral breast tissue 3 x 7 cm 3.  Lipofilling of bilateral breasts 110 cc each  SURGEON: Danile Trier Sanger Belvie Iribe, DO  ASSISTANT: Roetta Sessions, PA  EBL: 5 cc  CONDITION: Stable  COMPLICATIONS: None  INDICATION: The patient, Stacey Singh, is a 45 y.o. female born on April 14, 1976, is here for treatment breast asymmetry.   PROCEDURE DETAILS:  The patient was seen prior to surgery and marked.  The IV antibiotics were given. The patient was taken to the operating room and given a general anesthetic. A standard time out was performed and all information was confirmed by those in the room. SCDs were placed.   The chest and abdomen were prepped and draped with Betadine.  Local with epinephrine was injected at the umbilicus and the medial and lateral breast.  Tumescent was infused into the abdomen for a total of 600 liters.  The remaining 400 L was infused into the lateral breast on each side.  Once the local had time to work liposuction was performed for harvesting the fat from the abdomen.  The revolve device was utilized.  The fat was prepared according to the manufactures guidelines.  3 rinses were done to prepare the fat.  The fat was then placed into 10 cc syringes.  A 15 blade was used to make a small incision on the medial aspect of the breast and the fascia was injected to fill the defects.  The medial aspect of both breast was liposuction and a small amount in order to have better contour.  A total of 110 cc was placed on the right medial breast and 110 cc was placed on the left medial breast.  The incisions were closed with 5-0 Monocryl.  The lateral aspect of each breast was liposuctioned.  This provided better contour and a total of 250 cc was  removed from each lateral breast.  Once that was complete the lateral breast was marked again for excision.  A 3.6 cm area of the right breast skin and soft tissue and fat was excised.  An area of 3 x 7 cm was excised from the left breast in the lateral aspect of skin with soft tissue and fat.  The incisions were closed with 3-0 Monocryl deep followed by 4-0 and 5-0 Monocryl.  Dermabond was applied.  This did help with the contour of the breast.  Dermabond was applied to all areas and sterile dressings were applied.  The patient was placed in a breast binder and abdominal binder. The patient was allowed to wake up and taken to recovery room in stable condition at the end of the case. The family was notified at the end of the case.   The advanced practice practitioner (APP) assisted throughout the case.  The APP was essential in retraction and counter traction when needed to make the case progress smoothly.  This retraction and assistance made it possible to see the tissue plans for the procedure.  The assistance was needed for blood control, tissue re-approximation and assisted with closure of the incision site.

## 2020-10-09 NOTE — Interval H&P Note (Signed)
History and Physical Interval Note:  10/09/2020 8:10 AM  Stacey Singh  has presented today for surgery, with the diagnosis of history of breast cancer.  The various methods of treatment have been discussed with the patient and family. After consideration of risks, benefits and other options for treatment, the patient has consented to  Procedure(s) with comments: LIPOSUCTION WITH LIPOFILLING (Bilateral) - 3 hours total, please excess lateral breast tissue (Bilateral) as a surgical intervention.  The patient's history has been reviewed, patient examined, no change in status, stable for surgery.  I have reviewed the patient's chart and labs.  Questions were answered to the patient's satisfaction.     Loel Lofty Kellsie Grindle

## 2020-10-09 NOTE — Anesthesia Postprocedure Evaluation (Signed)
Anesthesia Post Note  Patient: Stacey Singh  Procedure(s) Performed: LIPOSUCTION WITH LIPOFILLING (Bilateral Breast) EXCISION OF EXCESS BREAST TISSUE (Bilateral Breast)     Patient location during evaluation: PACU Anesthesia Type: General Level of consciousness: awake and alert Pain management: pain level controlled Vital Signs Assessment: post-procedure vital signs reviewed and stable Respiratory status: spontaneous breathing, nonlabored ventilation and respiratory function stable Cardiovascular status: blood pressure returned to baseline and stable Postop Assessment: no apparent nausea or vomiting Anesthetic complications: no   No complications documented.  Last Vitals:  Vitals:   10/09/20 1255 10/09/20 1300  BP: 130/66   Pulse: 69   Resp: 17 16  Temp:    SpO2: 100%     Last Pain:  Vitals:   10/09/20 1300  TempSrc:   PainSc: 0-No pain                 Daaiyah Baumert,W. EDMOND

## 2020-10-09 NOTE — Anesthesia Procedure Notes (Signed)

## 2020-10-09 NOTE — Interval H&P Note (Signed)
History and Physical Interval Note:  10/09/2020 9:20 AM  Stacey Singh  has presented today for surgery, with the diagnosis of history of breast cancer.  The various methods of treatment have been discussed with the patient and family. After consideration of risks, benefits and other options for treatment, the patient has consented to  Procedure(s) with comments: LIPOSUCTION WITH LIPOFILLING (Bilateral) - 3 hours total, please excess lateral breast tissue (Bilateral) as a surgical intervention.  The patient's history has been reviewed, patient examined, no change in status, stable for surgery.  I have reviewed the patient's chart and labs.  Questions were answered to the patient's satisfaction.     Loel Lofty Jkwon Treptow

## 2020-10-09 NOTE — Transfer of Care (Signed)
Immediate Anesthesia Transfer of Care Note  Patient: Stacey Singh  Procedure(s) Performed: LIPOSUCTION WITH LIPOFILLING (Bilateral Breast) EXCISION OF EXCESS BREAST TISSUE (Bilateral Breast)  Patient Location: PACU  Anesthesia Type:General  Level of Consciousness: awake, alert , oriented, drowsy and patient cooperative  Airway & Oxygen Therapy: Patient Spontanous Breathing and Patient connected to face mask oxygen  Post-op Assessment: Report given to RN and Post -op Vital signs reviewed and stable  Post vital signs: Reviewed and stable  Last Vitals:  Vitals Value Taken Time  BP 127/79 10/09/20 1201  Temp    Pulse 104 10/09/20 1202  Resp 17 10/09/20 1202  SpO2 100 % 10/09/20 1202  Vitals shown include unvalidated device data.  Last Pain:  Vitals:   10/09/20 0819  TempSrc: Oral  PainSc: 0-No pain      Patients Stated Pain Goal: 3 (54/62/70 3500)  Complications: No complications documented.

## 2020-10-09 NOTE — Anesthesia Preprocedure Evaluation (Addendum)
Anesthesia Evaluation  Patient identified by MRN, date of birth, ID band Patient awake    Reviewed: Allergy & Precautions, H&P , NPO status , Patient's Chart, lab work & pertinent test results  Airway Mallampati: II  TM Distance: >3 FB Neck ROM: Full    Dental no notable dental hx. (+) Teeth Intact, Dental Advisory Given   Pulmonary neg pulmonary ROS, former smoker,    Pulmonary exam normal breath sounds clear to auscultation       Cardiovascular hypertension, Pt. on medications  Rhythm:Regular Rate:Normal     Neuro/Psych negative neurological ROS  negative psych ROS   GI/Hepatic negative GI ROS, Neg liver ROS,   Endo/Other  Morbid obesity  Renal/GU negative Renal ROS  negative genitourinary   Musculoskeletal   Abdominal   Peds  Hematology negative hematology ROS (+)   Anesthesia Other Findings   Reproductive/Obstetrics negative OB ROS                            Anesthesia Physical Anesthesia Plan  ASA: III  Anesthesia Plan: General   Post-op Pain Management:    Induction: Intravenous  PONV Risk Score and Plan: 4 or greater and Ondansetron, Dexamethasone and Midazolam  Airway Management Planned: Oral ETT  Additional Equipment:   Intra-op Plan:   Post-operative Plan: Extubation in OR  Informed Consent: I have reviewed the patients History and Physical, chart, labs and discussed the procedure including the risks, benefits and alternatives for the proposed anesthesia with the patient or authorized representative who has indicated his/her understanding and acceptance.     Dental advisory given  Plan Discussed with: CRNA  Anesthesia Plan Comments:         Anesthesia Quick Evaluation

## 2020-10-09 NOTE — Discharge Instructions (Signed)
INSTRUCTIONS FOR AFTER SURGERY   You will likely have some questions about what to expect following your operation.  The following information will help you and your family understand what to expect when you are discharged from the hospital.  Following these guidelines will help ensure a smooth recovery and reduce risks of complications.  Postoperative instructions include information on: diet, wound care, medications and physical activity.  AFTER SURGERY Expect to go home after the procedure.  In some cases, you may need to spend one night in the hospital for observation.  DIET This surgery does not require a specific diet.  However, I have to mention that the healthier you eat the better your body can start healing. It is important to increasing your protein intake.  This means limiting the foods with added sugar.  Focus on fruits and vegetables and some meat. It is very important to drink water after your surgery.  If your urine is bright yellow, then it is concentrated, and you need to drink more water.  As a general rule after surgery, you should have 8 ounces of water every hour while awake.  If you find you are persistently nauseated or unable to take in liquids let us know.  NO TOBACCO USE or EXPOSURE.  This will slow your healing process and increase the risk of a wound.  WOUND CARE If you don't have a drain: You can shower the day after surgery.  Use fragrance free soap.  Dial, [place], [place] and Cetaphil are usually mild on the skin.  If you have steri-strips / tape directly attached to your skin leave them in place. It is OK to get these wet.  No baths, pools or hot tubs for two weeks. We close your incision to leave the smallest and best-looking scar. No ointment or creams on your incisions until given the go ahead.  Especially not Neosporin (Too many skin reactions with this one).  A few weeks after surgery you can use Mederma and start massaging the scar. We ask you to wear your binder or  sports bra for the first 6 weeks around the clock, including while sleeping. This provides added comfort and helps reduce the fluid accumulation at the surgery site.  ACTIVITY No heavy lifting until cleared by the doctor.  It is OK to walk and climb stairs. In fact, moving your legs is very important to decrease your risk of a blood clot.  It will also help keep you from getting deconditioned.  Every 1 to 2 hours get up and walk for 5 minutes. This will help with a quicker recovery back to normal.  Let pain be your guide so you don't do too much.  NO, you cannot do the spring cleaning and don't plan on taking care of anyone else.  This is your time for TLC.   WORK Everyone returns to work at different times. As a rough guide, most people take at least 1 - 2 weeks off prior to returning to work. If you need documentation for your job, bring the forms to your postoperative follow up visit.  DRIVING Arrange for someone to bring you home from the hospital.  You may be able to drive a few days after surgery but not while taking any narcotics or valium.  BOWEL MOVEMENTS Constipation can occur after anesthesia and while taking pain medication.  It is important to stay ahead for your comfort.  We recommend taking Milk of Magnesia (2 tablespoons; twice a day) while taking  the pain pills.  SEROMA This is fluid your body tried to put in the surgical site.  This is normal but if it creates excessive pain and swelling let us know.  It usually decreases in a few weeks.  MEDICATIONS and PAIN CONTROL At your preoperative visit for you history and physical you were given the following medications: 1. An antibiotic: Start this medication when you get home and take according to the instructions on the bottle. 2. Zofran 4 mg:  This is to treat nausea and vomiting.  You can take this every 6 hours as needed and only if needed. 3. Norco (hydrocodone/acetaminophen) 5/325 mg:  This is only to be used after you have  taken the motrin or the tylenol. Every 8 hours as needed. Over the counter Medication to take: 4. Ibuprofen (Motrin) 600 mg:  Take this every 6 hours.  If you have additional pain then take 500 mg of the tylenol.  Only take the Norco after you have tried these two. 5. Miralax or stool softener of choice: Take this according to the bottle if you take the [place] Call your surgeon's office if any of the following occur: . Fever 101 degrees F or greater . Excessive bleeding or fluid from the incision site. . Pain that increases over time without aid from the medications . Redness, warmth, or pus draining from incision sites . Persistent nausea or inability to take in liquids . Severe misshapen area that underwent the operation.  No Tylenol until 2:41 pm   Post Anesthesia Home Care Instructions  Activity: Get plenty of rest for the remainder of the day. A responsible individual must stay with you for 24 hours following the procedure.  For the next 24 hours, DO NOT: -Drive a car -Paediatric nurse -Drink alcoholic beverages -Take any medication unless instructed by your physician -Make any legal decisions or sign important papers.  Meals: Start with liquid foods such as gelatin or soup. Progress to regular foods as tolerated. Avoid greasy, spicy, heavy foods. If nausea and/or vomiting occur, drink only clear liquids until the nausea and/or vomiting subsides. Call your physician if vomiting continues.  Special Instructions/Symptoms: Your throat may feel dry or sore from the anesthesia or the breathing tube placed in your throat during surgery. If this causes discomfort, gargle with warm salt water. The discomfort should disappear within 24 hours.  If you had a scopolamine patch placed behind your ear for the management of post- operative nausea and/or vomiting:  1. The medication in the patch is effective for 72 hours, after which it should be removed.  Wrap patch in a  tissue and discard in the trash. Wash hands thoroughly with soap and water. 2. You may remove the patch earlier than 72 hours if you experience unpleasant side effects which may include dry mouth, dizziness or visual disturbances. 3. Avoid touching the patch. Wash your hands with soap and water after contact with the patch.

## 2020-10-10 LAB — SURGICAL PATHOLOGY

## 2020-10-11 ENCOUNTER — Encounter (HOSPITAL_BASED_OUTPATIENT_CLINIC_OR_DEPARTMENT_OTHER): Payer: Self-pay | Admitting: Plastic Surgery

## 2020-10-22 ENCOUNTER — Ambulatory Visit (INDEPENDENT_AMBULATORY_CARE_PROVIDER_SITE_OTHER): Payer: BC Managed Care – PPO | Admitting: Plastic Surgery

## 2020-10-22 ENCOUNTER — Other Ambulatory Visit: Payer: Self-pay

## 2020-10-22 ENCOUNTER — Encounter: Payer: Self-pay | Admitting: Plastic Surgery

## 2020-10-22 VITALS — BP 114/72 | HR 78

## 2020-10-22 DIAGNOSIS — Z9013 Acquired absence of bilateral breasts and nipples: Secondary | ICD-10-CM

## 2020-10-22 NOTE — Progress Notes (Signed)
The patient is a 45 year old female here for follow-up after undergoing Lipo filling with removal of excess breast tissue laterally.  All the areas are healing very nicely.  She has little to no bruising.  She has done really well.  There is no sign of infection.  She can come out of the abdominal binder.  She can go into a sports bra.  I recommend a spanks for her abdomen area.  Patient will follow up with [place] in a couple weeks.  If she has any questions or concerns she knows to give [place] a call.

## 2020-11-05 ENCOUNTER — Encounter: Payer: BC Managed Care – PPO | Admitting: Surgical

## 2020-11-14 NOTE — Progress Notes (Signed)
Patient is a 45 year old female here for follow-up after Lipo filling of bilateral breasts with Dr. Marla Roe on 10/09/2020.  She also underwent removal of excess breast tissue laterally.  She reports overall she is doing really well.  She is interested in nipple areola tattooing, however would like to do this towards the end of the year.  She reports that she is having some tenderness over her right implant.  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  She is not having any tenderness of her left side.  Chaperone present on exam On exam bilateral breast incisions intact.  They are healing very nicely.  There is no subcutaneous fluid collection with palpation.  There is no erythema.  No cellulitic changes.  No tenderness with palpation.  Overall she has a very good result.  She has good symmetry.  We discussed scheduling additional follow-up and July to evaluate results of fat grafting.  We also discussed possible physical therapy for improvement of range of motion and tenderness of right chest.  She reports that she would like to hold off on this at this point but may call us in the future for a PT referral.  I discussed with her that this would be fine.  I do not see any sign of infection, seroma, hematoma.  I recommend she call with questions or concerns.

## 2020-11-15 ENCOUNTER — Other Ambulatory Visit: Payer: Self-pay

## 2020-11-15 ENCOUNTER — Ambulatory Visit (INDEPENDENT_AMBULATORY_CARE_PROVIDER_SITE_OTHER): Payer: BC Managed Care – PPO | Admitting: Surgical

## 2020-11-15 ENCOUNTER — Encounter: Payer: Self-pay | Admitting: Surgical

## 2020-11-15 VITALS — BP 136/89 | HR 80

## 2020-11-15 DIAGNOSIS — Z9889 Other specified postprocedural states: Secondary | ICD-10-CM

## 2020-11-15 DIAGNOSIS — Z9013 Acquired absence of bilateral breasts and nipples: Secondary | ICD-10-CM

## 2020-11-22 ENCOUNTER — Ambulatory Visit (INDEPENDENT_AMBULATORY_CARE_PROVIDER_SITE_OTHER): Payer: BC Managed Care – PPO | Admitting: Surgical

## 2020-11-22 ENCOUNTER — Encounter: Payer: Self-pay | Admitting: Plastic Surgery

## 2020-11-22 ENCOUNTER — Other Ambulatory Visit: Payer: Self-pay

## 2020-11-22 VITALS — BP 130/89 | HR 84

## 2020-11-22 DIAGNOSIS — Z9013 Acquired absence of bilateral breasts and nipples: Secondary | ICD-10-CM

## 2020-11-22 DIAGNOSIS — Z9889 Other specified postprocedural states: Secondary | ICD-10-CM

## 2020-11-22 NOTE — Progress Notes (Signed)
Patient is a 45 year old female here for evaluation of her right breast.  She underwent Lipo filling of bilateral breast and removal of excess lateral breast tissue with Dr. Marla Roe on 10/09/2020.  She reports that last night she noticed a popping sound when getting out of the shower and then saw some drainage from her right breast incision.  She is here to have that evaluated  Chaperone present on exam On exam bilateral breast incisions are intact.  No erythema noted.  No subcutaneous fluid collection with palpation.  She does have what appears to be a pinpoint opening along the medial right breast incision.  It appears to be a small suture knot hole.  It does not appear infected or concerning.  Will cover the area with a Steri-Strip, recommend leaving this in place for a few days to week.  If it falls off this will be fine.  Everything overall appears to be doing well.  No sign of infection, seroma, hematoma.  She has a follow-up scheduled in July.  Recommend calling with questions or concerns prior.

## 2020-12-03 ENCOUNTER — Ambulatory Visit: Payer: BC Managed Care – PPO | Admitting: Plastic Surgery

## 2021-01-27 ENCOUNTER — Encounter: Payer: Self-pay | Admitting: Plastic Surgery

## 2021-02-07 ENCOUNTER — Encounter: Payer: Self-pay | Admitting: Plastic Surgery

## 2021-02-07 ENCOUNTER — Other Ambulatory Visit: Payer: Self-pay

## 2021-02-07 ENCOUNTER — Ambulatory Visit (INDEPENDENT_AMBULATORY_CARE_PROVIDER_SITE_OTHER): Payer: BC Managed Care – PPO | Admitting: Plastic Surgery

## 2021-02-07 DIAGNOSIS — Z9889 Other specified postprocedural states: Secondary | ICD-10-CM

## 2021-02-07 NOTE — Progress Notes (Signed)
   Subjective:    Patient ID: Stacey Singh, female    DOB: 03-31-1976, 45 y.o.   MRN: [id number]  The patient is a 45 year old female here for follow-up on her bilateral breast reconstruction.  She had reconstruction with implants that were placed November 2021.  They are Mentor ultrahigh profile 535 cc gel implants.  She had excision of excess tissue with Lipo filling 4 months ago.  Today she complains of tenderness on the right chest.  I am able to actually palpate tenderness on the ribs and in the muscle.  This does not appear to be where the implant is or related to the implant.  She had several sessions with physical therapy that did not seem to help.  She wants to make sure there is not anything wrong or any recurrence.  She tried physical therapy but that did not resolve it completely.    Review of Systems  Constitutional:  Positive for activity change. Negative for appetite change.  Eyes: Negative.   Respiratory: Negative.    Cardiovascular: Negative.  Negative for leg swelling.  Gastrointestinal: Negative.   Endocrine: Negative.   Genitourinary: Negative.   Neurological: Negative.   Hematological: Negative.       Objective:   Physical Exam Vitals and nursing note reviewed.  Constitutional:      Appearance: Normal appearance.  HENT:     Head: Normocephalic and atraumatic.  Cardiovascular:     Rate and Rhythm: Normal rate.     Pulses: Normal pulses.  Skin:    General: Skin is warm.     Capillary Refill: Capillary refill takes less than 2 seconds.     Coloration: Skin is not jaundiced or pale.     Findings: No bruising or lesion.  Neurological:     Mental Status: She is alert. Mental status is at baseline.        Assessment & Plan:     ICD-10-CM   1. S/P breast reconstruction, bilateral  Z98.890      I think that her pain is rib and muscle in nature.  We can certainly do a x-ray or an ultrasound if this continues.  I would like her to try some Voltaren cream.   Also her weight seems to be not decreasing.  I will do a referral to the [facility] and see if we can help get her headed in the right direction.  I would like to see her back in 1-2 months and have resolution of the pain.   Pictures were obtained of the patient and placed in the chart with the patient's or guardian's permission.

## 2021-04-11 ENCOUNTER — Ambulatory Visit: Payer: BC Managed Care – PPO | Admitting: Plastic Surgery

## 2021-04-11 ENCOUNTER — Encounter: Payer: Self-pay | Admitting: Plastic Surgery

## 2021-04-11 ENCOUNTER — Other Ambulatory Visit: Payer: Self-pay

## 2021-04-11 DIAGNOSIS — Z9889 Other specified postprocedural states: Secondary | ICD-10-CM

## 2021-04-11 DIAGNOSIS — D0511 Intraductal carcinoma in situ of right breast: Secondary | ICD-10-CM | POA: Diagnosis not present

## 2021-04-11 NOTE — Progress Notes (Signed)
   Subjective:    Patient ID: Stacey Singh, female    DOB: Dec 01, 1975, 45 y.o.   MRN: [id number]  The patient is a 45 year old female here for follow-up on her bilateral breast reconstruction.  She had her implants placed in November 2021.  She has 535 cc ultrahigh profile Mentor implants.  She had lipo filling of about 5 months ago.  She still has tenderness of the right chest it appears to be at the level of the muscle in the ribs.  The area that is at the superior medial aspect of the breast has improved as she has used Voltaren and massage.  The area at the lateral lower part of the breast is still very tender.  She says some movements exacerbate it.  She tried physical therapy without a whole lot of help.  She is concerned about the area due to her history.  Her breast cancer was on the right side.  She is otherwise doing well.  Review of Systems  Constitutional: Negative.   HENT: Negative.    Eyes: Negative.   Respiratory: Negative.  Negative for cough, chest tightness and shortness of breath.   Cardiovascular: Negative.   Gastrointestinal: Negative.   Endocrine: Negative.   Genitourinary: Negative.   Musculoskeletal: Negative.   Skin: Negative.   Psychiatric/Behavioral: Negative.        Objective:   Physical Exam Vitals and nursing note reviewed.  Constitutional:      Appearance: Normal appearance.  HENT:     Head: Normocephalic and atraumatic.  Eyes:     Pupils: Pupils are equal, round, and reactive to light.  Cardiovascular:     Rate and Rhythm: Normal rate.     Pulses: Normal pulses.  Pulmonary:     Effort: Pulmonary effort is normal.  Abdominal:     General: Abdomen is flat.  Musculoskeletal:        General: No swelling or deformity.  Skin:    General: Skin is warm.     Capillary Refill: Capillary refill takes less than 2 seconds.     Coloration: Skin is not jaundiced.     Findings: No bruising or lesion.  Neurological:     Mental Status: She is alert. Mental  status is at baseline.  Psychiatric:        Mood and Affect: Mood normal.        Behavior: Behavior normal.        Thought Content: Thought content normal.       Assessment & Plan:     ICD-10-CM   1. S/P breast reconstruction, bilateral  Z98.890     2. Ductal carcinoma in situ (DCIS) of right breast  D05.11       We will go ahead and move forward with an ultrasound of the right breast.  If there is any area of concern then we will likely move to MRI.  We will do a telemetry visit after so we can plan next steps.

## 2021-04-15 ENCOUNTER — Ambulatory Visit
Admission: RE | Admit: 2021-04-15 | Discharge: 2021-04-15 | Disposition: A | Discharge status: Home / Self Care | Payer: BC Managed Care – PPO | Source: Ambulatory Visit | Attending: Plastic Surgery | Admitting: Plastic Surgery

## 2021-04-15 ENCOUNTER — Other Ambulatory Visit: Payer: Self-pay

## 2021-04-15 DIAGNOSIS — D0511 Intraductal carcinoma in situ of right breast: Secondary | ICD-10-CM

## 2021-04-15 DIAGNOSIS — Z9889 Other specified postprocedural states: Secondary | ICD-10-CM

## 2021-04-25 ENCOUNTER — Telehealth: Payer: BC Managed Care – PPO | Admitting: Plastic Surgery

## 2021-04-25 DIAGNOSIS — Z9889 Other specified postprocedural states: Secondary | ICD-10-CM

## 2021-04-25 NOTE — Progress Notes (Signed)
   Subjective:    Patient ID: Stacey Singh, female    DOB: February 06, 1976, 45 y.o.   MRN: [id number]  The patient is a 45 yrs old female joining me by televisit.  She underwent bilateral mastectomies in 11/21. She had reconstruction with 535 cc ultra high mentor implants.  She had fat filling in March 22.  She is concerned about tightness in the right breast. She had an [place] which was negative and that was comforting.  No changes at this time.  She did PT which helped her ROM.  She is willing to do it again.   Review of Systems  Constitutional: Negative.   Eyes: Negative.   Respiratory: Negative.    Cardiovascular: Negative.   Gastrointestinal: Negative.   Endocrine: Negative.   Genitourinary: Negative.   Musculoskeletal: Negative.   Skin: Negative.   Psychiatric/Behavioral: Negative.        Objective:   Physical Exam     Assessment & Plan:     ICD-10-CM   1. S/P breast reconstruction, bilateral  Z98.890        I connected with  Latarshia E Dishner on 04/25/21 by a video enabled telemedicine application and verified that I am speaking with the correct person using two identifiers. The patient was at home and I was at the office. We spent 15 minutes in the following way: review of chart, discussion, plan and documentation.    PT ordered and then we will talk again.  More fat filling is an option.  Release of capsule is another option.  May be rib irritation.  Patient in agreement with plan.   I discussed the limitations of evaluation and management by telemedicine. The patient expressed understanding and agreed to proceed.

## 2021-05-04 IMAGING — MG MM BREAST BX W/ LOC DEV 1ST LESION IMAGE BX SPEC STEREO GUIDE*R*
7 of 16 series · 7 of 32 positions shown · non-contrast
Comparison: Previous exams.
COMPARISON: Previous exams.

Addendum:
CLINICAL DATA: 43-year-old female presenting for biopsy of right
breast calcifications.

EXAM:
RIGHT BREAST STEREOTACTIC CORE NEEDLE BIOPSY

[R (1 of 7)]
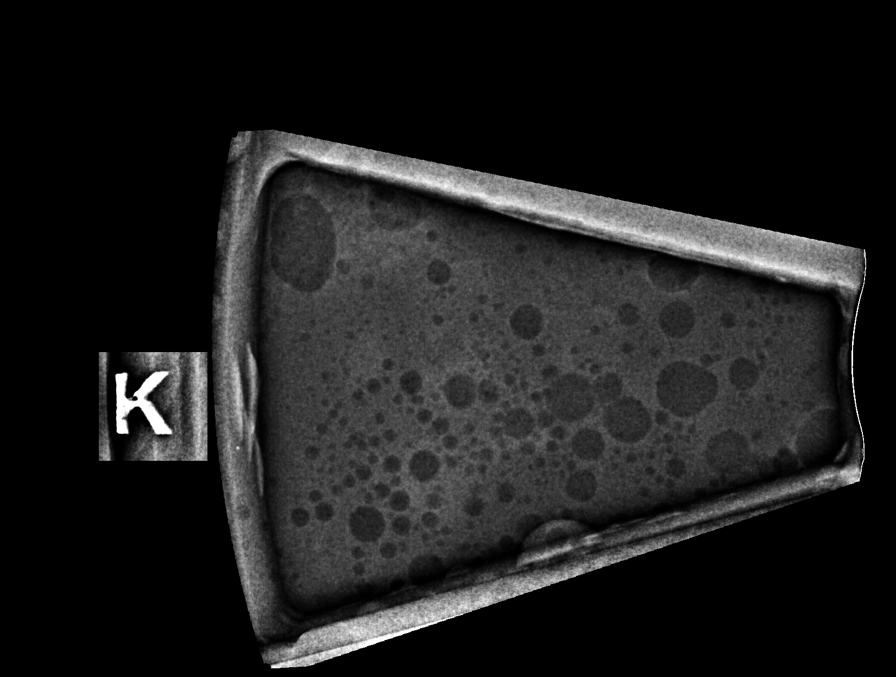

[R (2 of 7)]
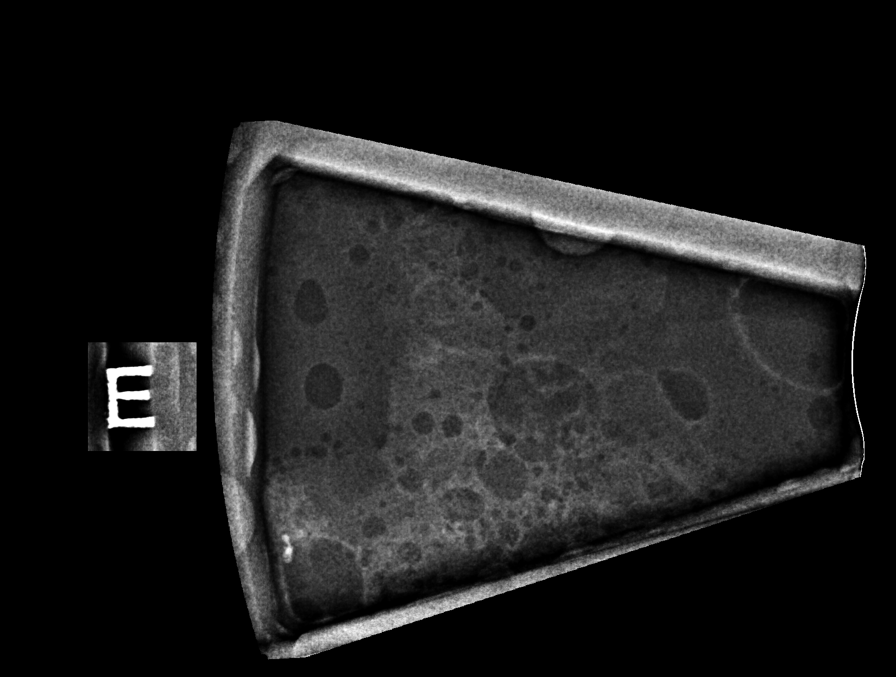

[R (3 of 7)]
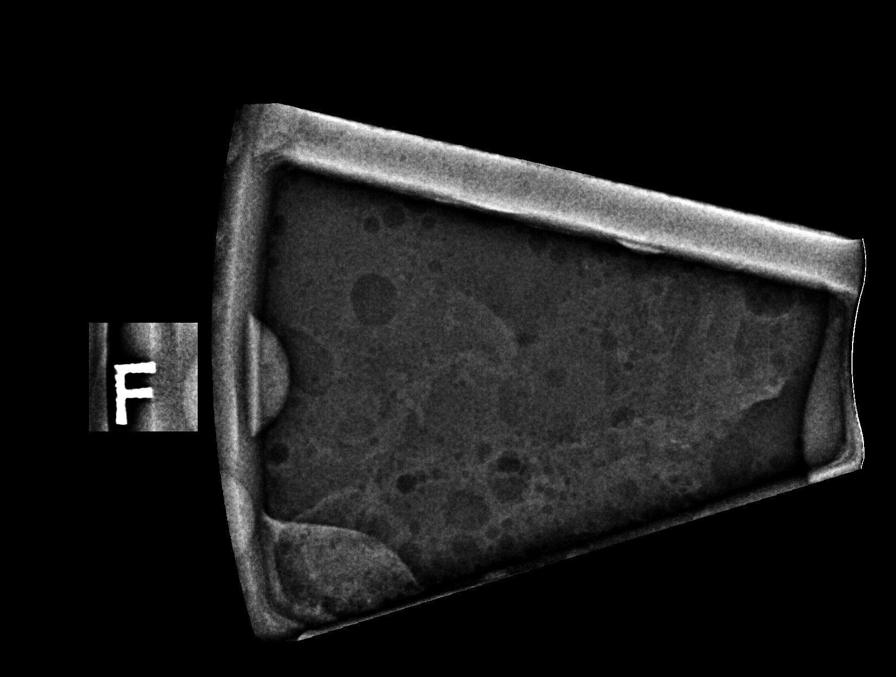

[R (4 of 7)]
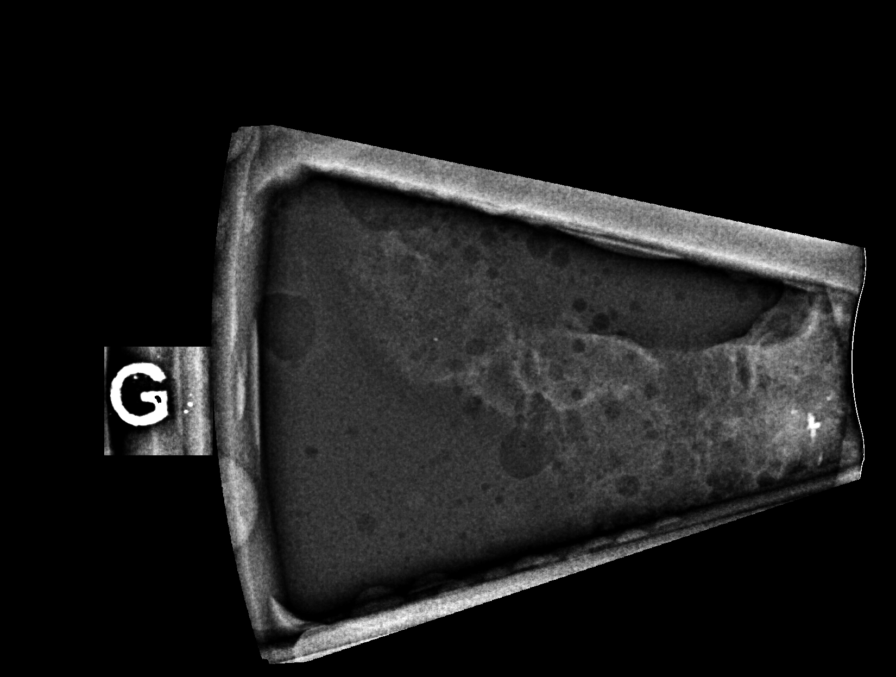

[R (5 of 7)]
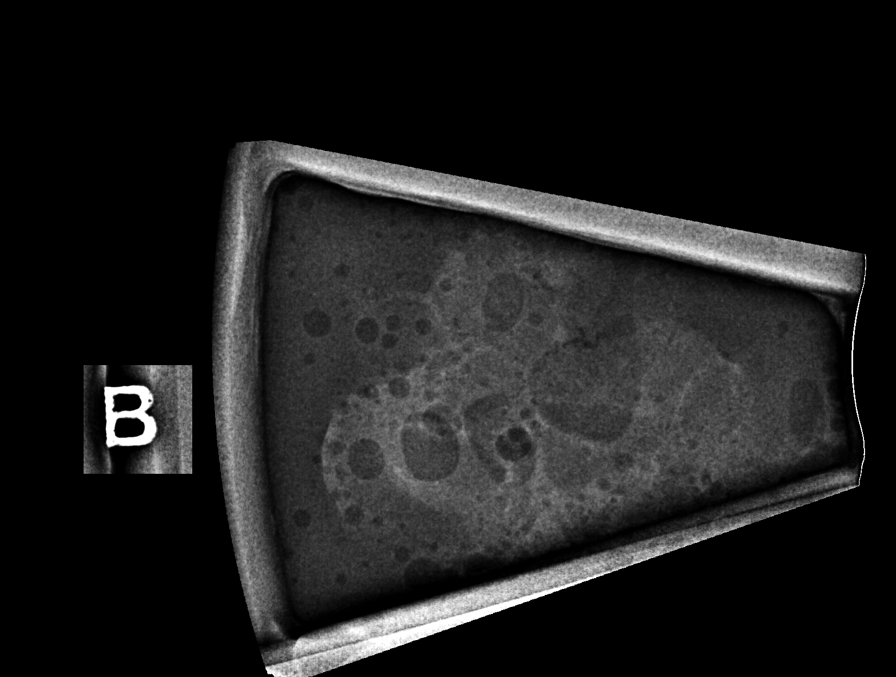

[R (6 of 7)]
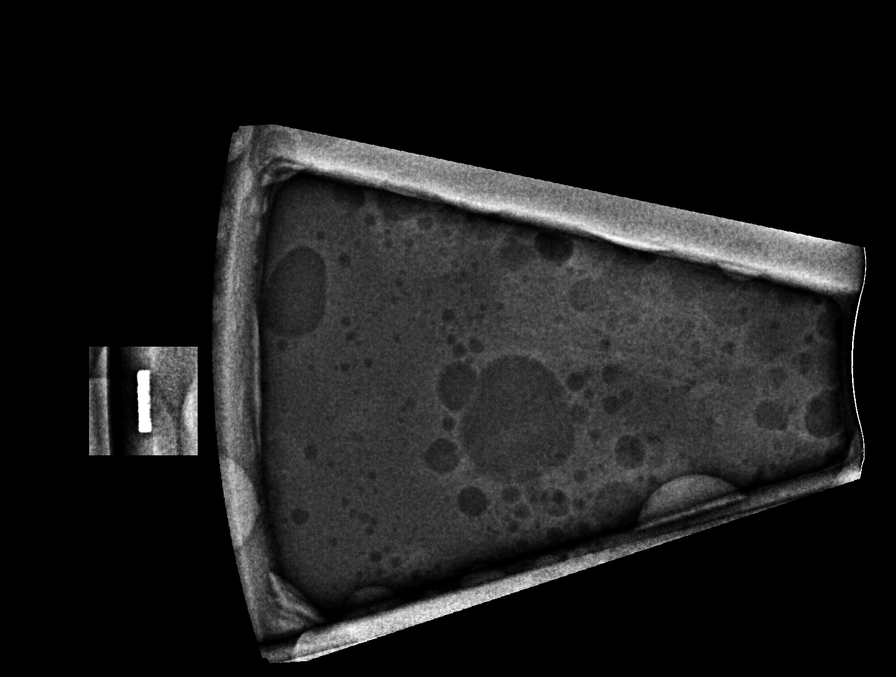

[R (7 of 7)]
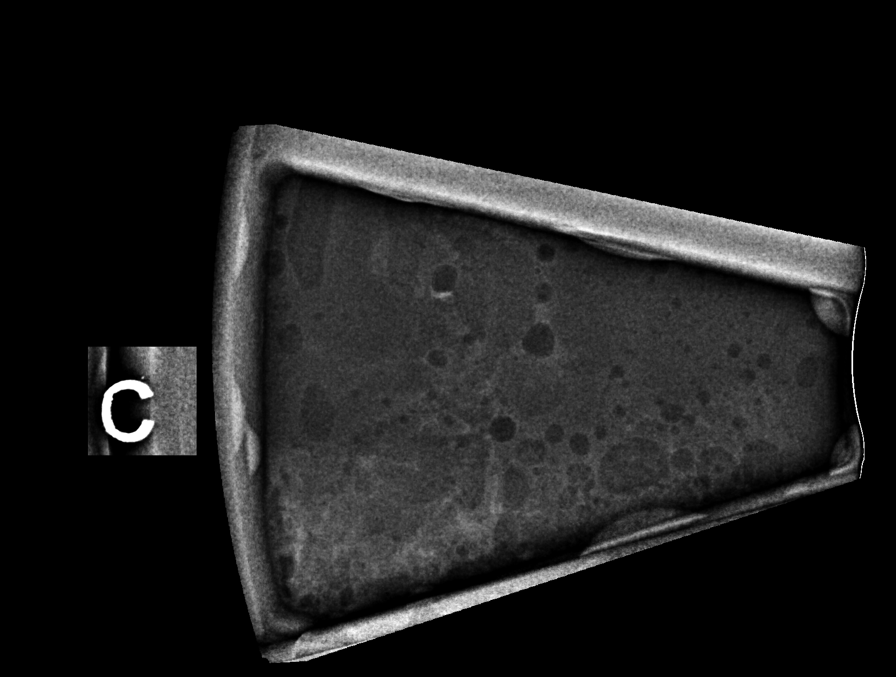

[7 of 32 positions shown; findings below may reference images not displayed]



1. Using sterile technique and 1% Lidocaine as local anesthetic,
under stereotactic guidance, a 9 gauge vacuum assisted device was
used to perform core needle biopsy of calcifications in the upper
inner quadrant of the right breast using a superior approach.
Specimen radiograph was performed showing at least 6 specimens with
calcifications. Specimens with calcifications are identified for
pathology.

Lesion quadrant: Upper inner quadrant

At the conclusion of the procedure, a coil tissue marker clip was
deployed into the biopsy cavity. Follow-up 2-view mammogram was
performed and dictated separately.

2. Using sterile technique and 1% Lidocaine as local anesthetic,
under stereotactic guidance, a 9 gauge vacuum assisted device was
used to perform core needle biopsy of calcifications in the
upper-outer quadrant of the right breast using a superior approach.
Specimen radiograph was performed showing at least 2 specimens with
calcifications. Specimens with calcifications are identified for
pathology.

Lesion quadrant: Upper outer quadrant

At the conclusion of the procedure, an X tissue marker clip was
deployed into the biopsy cavity. Follow-up 2-view mammogram was
performed and dictated separately.
IMPRESSION: Stereotactic-guided biopsy of calcifications in the upper outer and
upper inner quadrants of the right breast. No apparent
complications.

ADDENDUM:
Pathology revealed INTERMEDIATE GRADE DUCTAL CARCINOMA IN SITU WITH
CENTRAL NECROSIS AND CALCIFICATIONS of the Right breast, both
locations, upper inner and upper outer. This was found to be
concordant by Dr. Paneveziolrk Kakaras.

Pathology results were discussed with the patient by telephone. The
patient reported doing well after the biopsies with tenderness and
minimal bleeding at the sites. Post biopsy instructions and care
were reviewed and questions were answered. The patient was
encouraged to call The [REDACTED] for any
additional concerns.

Surgical consultation has been arranged with Dr. Selamawit Castrejon at
[REDACTED] on December 21, 2019.

Recommendation for bilateral breast MRI for further evaluation of
extent of disease.

Pathology results reported by Karsten Dolce, RN on 12/18/2019.



1. Using sterile technique and 1% Lidocaine as local anesthetic,
under stereotactic guidance, a 9 gauge vacuum assisted device was
used to perform core needle biopsy of calcifications in the upper
inner quadrant of the right breast using a superior approach.
Specimen radiograph was performed showing at least 6 specimens with
calcifications. Specimens with calcifications are identified for
pathology.

Lesion quadrant: Upper inner quadrant

At the conclusion of the procedure, a coil tissue marker clip was
deployed into the biopsy cavity. Follow-up 2-view mammogram was
performed and dictated separately.

2. Using sterile technique and 1% Lidocaine as local anesthetic,
under stereotactic guidance, a 9 gauge vacuum assisted device was
used to perform core needle biopsy of calcifications in the
upper-outer quadrant of the right breast using a superior approach.
Specimen radiograph was performed showing at least 2 specimens with
calcifications. Specimens with calcifications are identified for
pathology.

Lesion quadrant: Upper outer quadrant

At the conclusion of the procedure, an X tissue marker clip was
deployed into the biopsy cavity. Follow-up 2-view mammogram was
performed and dictated separately.
IMPRESSION: Stereotactic-guided biopsy of calcifications in the upper outer and
upper inner quadrants of the right breast. No apparent
complications.

## 2021-05-07 ENCOUNTER — Ambulatory Visit: Payer: BC Managed Care – PPO | Attending: Plastic Surgery

## 2021-05-07 ENCOUNTER — Other Ambulatory Visit: Payer: Self-pay

## 2021-05-07 DIAGNOSIS — M25611 Stiffness of right shoulder, not elsewhere classified: Secondary | ICD-10-CM | POA: Diagnosis not present

## 2021-05-07 DIAGNOSIS — M6289 Other specified disorders of muscle: Secondary | ICD-10-CM

## 2021-05-07 DIAGNOSIS — R293 Abnormal posture: Secondary | ICD-10-CM

## 2021-05-07 DIAGNOSIS — N644 Mastodynia: Secondary | ICD-10-CM

## 2021-05-07 NOTE — Therapy (Signed)
[place], [place], [address] Phone: [phone]   Fax:  [phone]  Physical Therapy Evaluation  Patient Details  Name: Stacey Singh MRN: [id number] Date of Birth: February 09, 1976 Referring Provider (PT): Wallace Going, DO   Encounter Date: 05/07/2021   PT End of Session - 05/07/21 1623     Visit Number 1    Number of Visits 7    Date for PT Re-Evaluation 06/21/21    Authorization Type BCBS    PT Start Time 1625   patient late   PT Stop Time 1710    PT Time Calculation (min) 45 min    Activity Tolerance Patient tolerated treatment well    Behavior During Therapy [facility] for tasks assessed/performed             Past Medical History:  Diagnosis Date   Allergy    Cancer ([place])    breast   Family history of breast cancer    Family history of leukemia    Heart murmur    in high school, resolved   History of COVID-19 08/05/2020   Hypertension     Past Surgical History:  Procedure Laterality Date   BREAST CYST EXCISION Bilateral 10/09/2020   Procedure: EXCISION OF EXCESS BREAST TISSUE;  Surgeon: Wallace Going, DO;  Location: [facility];  Service: Plastics;  Laterality: Bilateral;   BREAST RECONSTRUCTION WITH PLACEMENT OF TISSUE EXPANDER AND FLEX HD (ACELLULAR HYDRATED DERMIS) Bilateral 03/13/2020   Procedure: BILATERAL BREAST RECONSTRUCTION WITH PLACEMENT OF TISSUE EXPANDERS AND FLEX HD (ACELLULAR HYDRATED DERMIS);  Surgeon: Wallace Going, DO;  Location: [facility];  Service: Plastics;  Laterality: Bilateral;   CESAREAN SECTION  04/04/2012   Procedure: CESAREAN SECTION;  Surgeon: Farrel Gobble. Harrington Challenger, MD;  Location: [facility];  Service: Obstetrics;  Laterality: N/A;   CESAREAN SECTION N/A 05/19/2015   Procedure: CESAREAN SECTION;  Surgeon: Allyn Kenner, DO;  Location: [facility];  Service: Obstetrics;  Laterality: N/A;   CESAREAN SECTION     DILATION AND  CURETTAGE OF UTERUS     LIPOSUCTION WITH LIPOFILLING Bilateral 10/09/2020   Procedure: LIPOSUCTION WITH LIPOFILLING;  Surgeon: Wallace Going, DO;  Location: [facility];  Service: Plastics;  Laterality: Bilateral;  3 hours total, please   MASTECTOMY W/ SENTINEL NODE BIOPSY Bilateral 03/13/2020   Procedure: BILATERAL MASTECTOMY WITH RIGHT SENTINEL LYMPH NODE BIOPSY;  Surgeon: Jovita Kussmaul, MD;  Location: [facility];  Service: General;  Laterality: Bilateral;  PEC BLOCK   REMOVAL OF BILATERAL TISSUE EXPANDERS WITH PLACEMENT OF BILATERAL BREAST IMPLANTS Bilateral 06/13/2020   Procedure: REMOVAL OF BILATERAL TISSUE EXPANDERS WITH PLACEMENT OF BILATERAL BREAST IMPLANTS;  Surgeon: Wallace Going, DO;  Location: [facility];  Service: Plastics;  Laterality: Bilateral;   WISDOM TOOTH EXTRACTION      There were no vitals filed for this visit.    Subjective Assessment - 05/07/21 1624     Subjective Patient previously was receiving PT through cancer rehab 6 months ago, but is still experiencing tightness in the Rt breast that can be dependent upon the bra that she wears. She also experiences a pinching sensation along the Rt breast when she bends forward. Her last surgery was in March 2022 for fat grafting. She had breast reconstruction in November 2021 following bilateral mastectomies in August 2021 due to breast cancer. She is cancer free at this time. 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 She reports the tightness has been ongoing since the expanders were placed during her first surgery in August 2021. She had recent US to determine placement of implants, which was unremarkable. She reports tightness/pinching at it's worst rated as 9/10 with bending over.    Pertinent History history of breast cancer    Patient Stated Goals "Less tightness, and less discomfort with bending."    Currently in  Pain? No/denies                [facility] PT Assessment - 05/07/21 0001       Assessment   Medical Diagnosis Z98.890 (ICD-10-CM) - S/P breast reconstruction, bilateral    Referring Provider (PT) Dillingham, Loel Lofty, DO    Onset Date/Surgical Date 03/13/20    Hand Dominance Right    Next MD Visit nothing sheduled    Prior Therapy Yes      Precautions   Precautions --   at risk for lymphadema     Restrictions   Weight Bearing Restrictions No      Balance Screen   Has the patient fallen in the past 6 months No      [place] residence    Living Arrangements Spouse/significant other;Children    Type of [place]      Prior Function   Level of Independence Independent    Vocation Full time employment    Vocation 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[employer], coaches lacrosse    Leisure outdoor activities      Cognition   Overall Cognitive Status Within Functional Limits for tasks assessed      Observation/Other Assessments   Focus on Therapeutic Outcomes (FOTO)  64% function to 75% predicted      Sensation   Light Touch Not tested      Coordination   Gross Motor Movements are Fluid and Coordinated Yes      Posture/Postural Control   Posture/Postural Control Postural limitations    Postural Limitations Rounded Shoulders;Forward head;Increased thoracic kyphosis      AROM   Right Shoulder Flexion 170 Degrees    Right Shoulder ABduction 160 Degrees    Right Shoulder Internal Rotation 45 Degrees    Right Shoulder External Rotation 90 Degrees      Strength   Overall Strength Comments Bilateral shoulder strength 5/5      Flexibility   Soft Tissue Assessment /Muscle Length yes   latissimus dorsi WNL bilaterally     Palpation   Spinal mobility T-spine hypomobility    Palpation comment significant tautness and palpable tendernerss about lateral and distal Rt breast, hypersensitivty about sternum, hypomobility about [place] joint                         Objective measurements completed on examination: See above findings.       [facility] Adult PT Treatment/Exercise - 05/07/21 0001       Self-Care   Self-Care Other Self-Care Comments    Other Self-Care Comments  see patient education                     PT Education - 05/07/21 1751     Education Details Education on assessment findings, POC, and HEP.    Person(s) Educated Patient    Methods Explanation;Demonstration;Verbal cues;Handout    Comprehension Verbalized understanding;Returned demonstration;Verbal cues required  PT Short Term Goals - 05/07/21 1753       PT SHORT TERM GOAL #1   Title Patient will be independent with initial HEP.    Baseline issued at eval.    Status New    Target Date 05/21/21               PT Long Term Goals - 05/07/21 1754       PT LONG TERM GOAL #1   Title Patient will report tightness/pinching at worst rated as 5/10 with bending to improve her tolerance to tying her shoes    Baseline 9/10 with bending    Status New    Target Date 06/18/21      PT LONG TERM GOAL #2   Title Patient will score at least 75% function on FOTO to signify clinically meaningful improvement in functional abilities.    Baseline 64%    Status New    Target Date 06/18/21      PT LONG TERM GOAL #3   Title Patient will demonstrate at least 170 degrees of Rt shoulder abduction AROM without reports of increased breast tightness to improve ability to complete overhead activities.    Baseline see flowsheet    Status New    Target Date 06/18/21      PT LONG TERM GOAL #4   Title Patient will be independent with advanced home program to maintain/progress current level of function.    Status New    Target Date 06/18/21                    Plan - 05/07/21 1805     Clinical Impression Statement Patient is a 45 y/o Rt-handed female with chief complaint of Rt breast tightness/pinching that is  aggravated by bending and overhead reaching. She has a history of 3 surgical procedures in the past year secondary to breast cancer with her most recent surgery occuring in March 2022. She reports the tightness and pinching sensation have been present ever since her initial breast surgery in August 2021. Upon assessment she is noted to have mild Rt shoulder AROM limitations, postural dysfunction, thoracic spine hypomobility, and sigificant tautness and palpable tenderness about the Rt breast and surrounding musculature. She will benefit from skilled PT to address the above stated deficits in order to optimize her function.    Personal Factors and Comorbidities Time since onset of injury/illness/exacerbation;Profession;Comorbidity 2    Comorbidities bilateral mastecomy, expanders with fills    Examination-Activity Limitations Reach Overhead;Bend    Examination-Participation Restrictions Shop;Laundry;Meal Prep;Occupation    Stability/Clinical Decision Making Stable/Uncomplicated    Clinical Decision Making Low    Rehab Potential Good    PT Frequency 1x / week    PT Duration 6 weeks    PT Treatment/Interventions ADLs/Self Care Home Management;Therapeutic activities;Therapeutic exercise;Neuromuscular re-education;Patient/family education;Manual techniques;Scar mobilization;Passive range of motion;Dry needling;Taping    PT Next Visit Plan manual to Rt breast and surrounding musculature (did message Dr. Marla Roe for clearance regarding scar tissue mobilization/DTM), thoracic mobility, posture    PT Home Exercise Plan Access Code: [email or web address]    Consulted and Agree with Plan of Care Patient             Patient will benefit from skilled therapeutic intervention in order to improve the following deficits and impairments:  Decreased range of motion, Increased fascial restricitons, Impaired UE functional use, Increased muscle spasms, Decreased skin integrity, Pain, Impaired flexibility, Decreased scar  mobility, Postural dysfunction  Visit Diagnosis: Stiffness of  right shoulder, not elsewhere classified  Breast pain, right  Abnormal increased muscle tightness  Abnormal posture     Problem List Patient Active Problem List   Diagnosis Date Noted   S/P breast reconstruction, bilateral 07/17/2020   Acquired absence of breast 03/22/2020   Breast cancer ([place]) 03/13/2020   Genetic testing 01/05/2020   Family history of breast cancer    Family history of leukemia    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   Obesity, Class III, BMI 40-49.9 (morbid obesity) ([place]) 12/29/2017   Benign essential HTN 06/13/2015   Gwendolyn Grant, PT, DPT, ATC 05/07/21 6:15 PM  [facility] [address] [place], [place], [address] Phone: [phone]   Fax:  [phone]  Name: Stacey Singh MRN: [id number] Date of Birth: 22-Feb-1976

## 2021-05-19 ENCOUNTER — Ambulatory Visit: Payer: BC Managed Care – PPO

## 2021-05-19 ENCOUNTER — Encounter: Payer: Self-pay | Admitting: Adult Health

## 2021-05-19 ENCOUNTER — Other Ambulatory Visit: Payer: Self-pay

## 2021-05-19 DIAGNOSIS — N644 Mastodynia: Secondary | ICD-10-CM

## 2021-05-19 DIAGNOSIS — M6289 Other specified disorders of muscle: Secondary | ICD-10-CM

## 2021-05-19 DIAGNOSIS — M25611 Stiffness of right shoulder, not elsewhere classified: Secondary | ICD-10-CM | POA: Diagnosis not present

## 2021-05-19 DIAGNOSIS — R293 Abnormal posture: Secondary | ICD-10-CM

## 2021-05-19 NOTE — Therapy (Signed)
[place], [place], [address] Phone: [phone]   Fax:  [phone]  Physical Therapy Treatment  Patient Details  Name: Stacey Singh MRN: [id number] Date of Birth: 01-Jul-1976 Referring Provider (PT): Wallace Going, DO   Encounter Date: 05/19/2021   PT End of Session - 05/19/21 0721     Visit Number 2    Number of Visits 7    Date for PT Re-Evaluation 06/21/21    Authorization Type BCBS    PT Start Time 0721    PT Stop Time 0800    PT Time Calculation (min) 39 min    Activity Tolerance Patient tolerated treatment well    Behavior During Therapy [facility] for tasks assessed/performed             Past Medical History:  Diagnosis Date   Allergy    Cancer ([place])    breast   Family history of breast cancer    Family history of leukemia    Heart murmur    in high school, resolved   History of COVID-19 08/05/2020   Hypertension     Past Surgical History:  Procedure Laterality Date   BREAST CYST EXCISION Bilateral 10/09/2020   Procedure: EXCISION OF EXCESS BREAST TISSUE;  Surgeon: Wallace Going, DO;  Location: [facility];  Service: Plastics;  Laterality: Bilateral;   BREAST RECONSTRUCTION WITH PLACEMENT OF TISSUE EXPANDER AND FLEX HD (ACELLULAR HYDRATED DERMIS) Bilateral 03/13/2020   Procedure: BILATERAL BREAST RECONSTRUCTION WITH PLACEMENT OF TISSUE EXPANDERS AND FLEX HD (ACELLULAR HYDRATED DERMIS);  Surgeon: Wallace Going, DO;  Location: [facility];  Service: Plastics;  Laterality: Bilateral;   CESAREAN SECTION  04/04/2012   Procedure: CESAREAN SECTION;  Surgeon: Farrel Gobble. Harrington Challenger, MD;  Location: [facility];  Service: Obstetrics;  Laterality: N/A;   CESAREAN SECTION N/A 05/19/2015   Procedure: CESAREAN SECTION;  Surgeon: Allyn Kenner, DO;  Location: [facility];  Service: Obstetrics;  Laterality: N/A;   CESAREAN SECTION     DILATION AND CURETTAGE OF UTERUS      LIPOSUCTION WITH LIPOFILLING Bilateral 10/09/2020   Procedure: LIPOSUCTION WITH LIPOFILLING;  Surgeon: Wallace Going, DO;  Location: [facility];  Service: Plastics;  Laterality: Bilateral;  3 hours total, please   MASTECTOMY W/ SENTINEL NODE BIOPSY Bilateral 03/13/2020   Procedure: BILATERAL MASTECTOMY WITH RIGHT SENTINEL LYMPH NODE BIOPSY;  Surgeon: Jovita Kussmaul, MD;  Location: [facility];  Service: General;  Laterality: Bilateral;  PEC BLOCK   REMOVAL OF BILATERAL TISSUE EXPANDERS WITH PLACEMENT OF BILATERAL BREAST IMPLANTS Bilateral 06/13/2020   Procedure: REMOVAL OF BILATERAL TISSUE EXPANDERS WITH PLACEMENT OF BILATERAL BREAST IMPLANTS;  Surgeon: Wallace Going, DO;  Location: [facility];  Service: Plastics;  Laterality: Bilateral;   WISDOM TOOTH EXTRACTION      There were no vitals filed for this visit.   Subjective Assessment - 05/19/21 0721     Subjective Patient reports she is feeling about the same. "I want to say it's feeling better, but I don't want to say that yet."    Currently in Pain? No/denies                      [facility] Adult PT Treatment/Exercise:  Therapeutic Exercise: - N/A  Manual Therapy: - STM/DTM/Trigger point release to Rt pectoral musculature, latissimus dorsi, scar tissue mobilization.   Neuromuscular re-ed: - n/a  Therapeutic Activity: - n/a  Self-care/Home Management: - Education on TPDN indications, benefits, expectations, side effects; handout provided - Reviewed and updated HEP to include pec doorway stretch and lat stretch.                    PT Education - 05/19/21 0803     Education Details See self-care    Person(s) Educated Patient    Methods Explanation;Demonstration;Handout    Comprehension Verbalized understanding;Returned demonstration;Verbal cues required              PT Short Term Goals - 05/07/21 1753       PT SHORT TERM GOAL #1    Title Patient will be independent with initial HEP.    Baseline issued at eval.    Status New    Target Date 05/21/21               PT Long Term Goals - 05/07/21 1754       PT LONG TERM GOAL #1   Title Patient will report tightness/pinching at worst rated as 5/10 with bending to improve her tolerance to tying her shoes    Baseline 9/10 with bending    Status New    Target Date 06/18/21      PT LONG TERM GOAL #2   Title Patient will score at least 75% function on FOTO to signify clinically meaningful improvement in functional abilities.    Baseline 64%    Status New    Target Date 06/18/21      PT LONG TERM GOAL #3   Title Patient will demonstrate at least 170 degrees of Rt shoulder abduction AROM without reports of increased breast tightness to improve ability to complete overhead activities.    Baseline see flowsheet    Status New    Target Date 06/18/21      PT LONG TERM GOAL #4   Title Patient will be independent with advanced home program to maintain/progress current level of function.    Status New    Target Date 06/18/21                   Plan - 05/19/21 0804     Clinical Impression Statement Patient tolerated session well today, which focused on manual therapy techniques to assist in overall pain reduction/tightness about the Rt chest. She is noted to have tightness about lateral asect of surgical scar and significant tautness/palpable tenderness about pectoral musculature and latissimus dorsi. She will potentially benefit from TPDN at future sessions if tautness remains. HEP updated to include futher stretching.    PT Treatment/Interventions ADLs/Self Care Home Management;Therapeutic activities;Therapeutic exercise;Neuromuscular re-education;Patient/family education;Manual techniques;Scar mobilization;Passive range of motion;Dry needling;Taping    PT Next Visit Plan manual to Rt breast and surrounding musculature thoracic mobility, sternal/clavicle  mobility, posture, potential TPDN at lat and pec    PT Home Exercise Plan Access Code: [email or web address]    Consulted and Agree with Plan of Care Patient             Patient will benefit from skilled therapeutic intervention in order to improve the following deficits and impairments:  Decreased range of motion, Increased fascial restricitons, Impaired UE functional use, Increased muscle spasms, Decreased skin integrity, Pain, Impaired flexibility, Decreased scar mobility, Postural dysfunction  Visit Diagnosis: Stiffness of right shoulder, not elsewhere classified  Breast pain, right  Abnormal increased muscle tightness  Abnormal posture     Problem List Patient Active Problem List   Diagnosis Date Noted   S/P breast  reconstruction, bilateral 07/17/2020   Acquired absence of breast 03/22/2020   Breast cancer ([place]) 03/13/2020   Genetic testing 01/05/2020   Family history of breast cancer    Family history of leukemia    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   Obesity, Class III, BMI 40-49.9 (morbid obesity) ([place]) 12/29/2017   Benign essential HTN 06/13/2015   Gwendolyn Grant, PT, DPT, ATC 05/19/21 8:13 AM  [facility] [address] [place], [place], [address] Phone: [phone]   Fax:  [phone]  Name: Stacey Singh MRN: [id number] Date of Birth: 24-Aug-1975

## 2021-05-29 ENCOUNTER — Ambulatory Visit: Payer: BC Managed Care – PPO | Admitting: Physical Therapy

## 2021-06-05 ENCOUNTER — Ambulatory Visit: Payer: BC Managed Care – PPO | Attending: Plastic Surgery

## 2021-06-05 ENCOUNTER — Other Ambulatory Visit: Payer: Self-pay

## 2021-06-05 DIAGNOSIS — N644 Mastodynia: Secondary | ICD-10-CM | POA: Diagnosis present

## 2021-06-05 DIAGNOSIS — M25612 Stiffness of left shoulder, not elsewhere classified: Secondary | ICD-10-CM | POA: Diagnosis present

## 2021-06-05 DIAGNOSIS — R293 Abnormal posture: Secondary | ICD-10-CM | POA: Diagnosis present

## 2021-06-05 DIAGNOSIS — M6289 Other specified disorders of muscle: Secondary | ICD-10-CM

## 2021-06-05 DIAGNOSIS — Z483 Aftercare following surgery for neoplasm: Secondary | ICD-10-CM | POA: Insufficient documentation

## 2021-06-05 DIAGNOSIS — M25611 Stiffness of right shoulder, not elsewhere classified: Secondary | ICD-10-CM | POA: Diagnosis present

## 2021-06-05 DIAGNOSIS — M6281 Muscle weakness (generalized): Secondary | ICD-10-CM | POA: Insufficient documentation

## 2021-06-05 NOTE — Patient Instructions (Signed)

## 2021-06-05 NOTE — Therapy (Signed)
[place], [place], [address] Phone: [phone]   Fax:  [phone]  Physical Therapy Treatment  Patient Details  Name: Stacey Singh MRN: [id number] Date of Birth: January 13, 1976 Referring Provider (PT): Wallace Going, DO   Encounter Date: 06/05/2021   PT End of Session - 06/05/21 0932     Visit Number 3    Number of Visits 7    Date for PT Re-Evaluation 06/21/21    Authorization Type BCBS    PT Start Time 0932    PT Stop Time 1016   6 min TPDN   PT Time Calculation (min) 44 min    Activity Tolerance Patient tolerated treatment well    Behavior During Therapy [facility] for tasks assessed/performed             Past Medical History:  Diagnosis Date   Allergy    Cancer ([place])    breast   Family history of breast cancer    Family history of leukemia    Heart murmur    in high school, resolved   History of COVID-19 08/05/2020   Hypertension     Past Surgical History:  Procedure Laterality Date   BREAST CYST EXCISION Bilateral 10/09/2020   Procedure: EXCISION OF EXCESS BREAST TISSUE;  Surgeon: Wallace Going, DO;  Location: [place];  Service: Plastics;  Laterality: Bilateral;   BREAST RECONSTRUCTION WITH PLACEMENT OF TISSUE EXPANDER AND FLEX HD (ACELLULAR HYDRATED DERMIS) Bilateral 03/13/2020   Procedure: BILATERAL BREAST RECONSTRUCTION WITH PLACEMENT OF TISSUE EXPANDERS AND FLEX HD (ACELLULAR HYDRATED DERMIS);  Surgeon: Wallace Going, DO;  Location: [place];  Service: Plastics;  Laterality: Bilateral;   CESAREAN SECTION  04/04/2012   Procedure: CESAREAN SECTION;  Surgeon: Farrel Gobble. Harrington Challenger, MD;  Location: [facility];  Service: Obstetrics;  Laterality: N/A;   CESAREAN SECTION N/A 05/19/2015   Procedure: CESAREAN SECTION;  Surgeon: Allyn Kenner, DO;  Location: [facility];  Service: Obstetrics;  Laterality: N/A;   CESAREAN SECTION     DILATION AND CURETTAGE  OF UTERUS     LIPOSUCTION WITH LIPOFILLING Bilateral 10/09/2020   Procedure: LIPOSUCTION WITH LIPOFILLING;  Surgeon: Wallace Going, DO;  Location: [place];  Service: Plastics;  Laterality: Bilateral;  3 hours total, please   MASTECTOMY W/ SENTINEL NODE BIOPSY Bilateral 03/13/2020   Procedure: BILATERAL MASTECTOMY WITH RIGHT SENTINEL LYMPH NODE BIOPSY;  Surgeon: Jovita Kussmaul, MD;  Location: [place];  Service: General;  Laterality: Bilateral;  PEC BLOCK   REMOVAL OF BILATERAL TISSUE EXPANDERS WITH PLACEMENT OF BILATERAL BREAST IMPLANTS Bilateral 06/13/2020   Procedure: REMOVAL OF BILATERAL TISSUE EXPANDERS WITH PLACEMENT OF BILATERAL BREAST IMPLANTS;  Surgeon: Wallace Going, DO;  Location: [place];  Service: Plastics;  Laterality: Bilateral;   WISDOM TOOTH EXTRACTION      There were no vitals filed for this visit.   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 A lot of pulling, tugging, lifting, things I don't particularly like."    Currently in Pain? No/denies                [facility] Adult PT Treatment/Exercise:   Therapeutic Exercise: - pec stretch on foam roller 2 min - Rt sleeper stretch 2 x 30 sec    Manual Therapy: - STM/DTM/Trigger point release to Rt pectoral musculature, latissimus  dorsi, subscapularis - scar tissue mobilization.  - Rt shoulder PROM in all planes pre and post TPDN    Neuromuscular re-ed: - n/a   Therapeutic Activity: - n/a   Self-care/Home Management: - Reviewed TPDN indications, benefits, expectations, side effects; handout provided         [facility] PT Assessment - 06/05/21 0001       AROM   Right Shoulder Internal Rotation 55 Degrees   67 post TPDN                            Trigger Point Dry Needling - 06/05/21 0001     Consent Given? Yes    Education Handout Provided Yes    Muscles  Treated Upper Quadrant Pectoralis major;Subscapularis    Dry Needling Comments TPDN scar tissue technique; TPDN performed by Voncille Lo PT    Pectoralis Major Response Palpable increased muscle length    Subscapularis Response Palpable increased muscle length                   PT Education - 06/05/21 1029     Education Details See self-care    Person(s) Educated Patient    Methods Explanation    Comprehension Verbalized understanding              PT Short Term Goals - 05/07/21 1753       PT SHORT TERM GOAL #1   Title Patient will be independent with initial HEP.    Baseline issued at eval.    Status New    Target Date 05/21/21               PT Long Term Goals - 05/07/21 1754       PT LONG TERM GOAL #1   Title Patient will report tightness/pinching at worst rated as 5/10 with bending to improve her tolerance to tying her shoes    Baseline 9/10 with bending    Status New    Target Date 06/18/21      PT LONG TERM GOAL #2   Title Patient will score at least 75% function on FOTO to signify clinically meaningful improvement in functional abilities.    Baseline 64%    Status New    Target Date 06/18/21      PT LONG TERM GOAL #3   Title Patient will demonstrate at least 170 degrees of Rt shoulder abduction AROM without reports of increased breast tightness to improve ability to complete overhead activities.    Baseline see flowsheet    Status New    Target Date 06/18/21      PT LONG TERM GOAL #4   Title Patient will be independent with advanced home program to maintain/progress current level of function.    Status New    Target Date 06/18/21                   Plan - 06/05/21 1024     Clinical Impression Statement Today's session focused on reducing musculature/scar tissue restriction about the Rt chest. Palpable increased muscle length of the subscapularis noted post TPDN and reduced palpable tenderness about sternal aspect of the  pectoralis major reported. Significant improvement in Rt shoulder IR AROM following manual therapy achieving 67 degrees. She reported soreness at the end of session, as expected, though overall tolerated session well.    PT Treatment/Interventions ADLs/Self Care Home Management;Therapeutic activities;Therapeutic exercise;Neuromuscular re-education;Patient/family education;Manual techniques;Scar mobilization;Passive range of motion;Dry  needling;Taping    PT Next Visit Plan manual to Rt breast and surrounding musculature thoracic mobility, sternal/clavicle mobility, posture, potential TPDN lat, pec, subscap, scar tissue    PT Home Exercise Plan Access Code: [email or web address]    Consulted and Agree with Plan of Care Patient             Patient will benefit from skilled therapeutic intervention in order to improve the following deficits and impairments:  Decreased range of motion, Increased fascial restricitons, Impaired UE functional use, Increased muscle spasms, Decreased skin integrity, Pain, Impaired flexibility, Decreased scar mobility, Postural dysfunction  Visit Diagnosis: Stiffness of right shoulder, not elsewhere classified  Breast pain, right  Abnormal increased muscle tightness  Abnormal posture     Problem List Patient Active Problem List   Diagnosis Date Noted   S/P breast reconstruction, bilateral 07/17/2020   Acquired absence of breast 03/22/2020   Breast cancer ([place]) 03/13/2020   Genetic testing 01/05/2020   Family history of breast cancer    Family history of leukemia    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

## 2021-06-12 ENCOUNTER — Ambulatory Visit: Payer: BC Managed Care – PPO | Admitting: Physical Therapy

## 2021-06-12 ENCOUNTER — Encounter: Payer: Self-pay | Admitting: Physical Therapy

## 2021-06-12 ENCOUNTER — Other Ambulatory Visit: Payer: Self-pay

## 2021-06-12 DIAGNOSIS — M25611 Stiffness of right shoulder, not elsewhere classified: Secondary | ICD-10-CM

## 2021-06-12 DIAGNOSIS — R293 Abnormal posture: Secondary | ICD-10-CM

## 2021-06-12 DIAGNOSIS — Z483 Aftercare following surgery for neoplasm: Secondary | ICD-10-CM

## 2021-06-12 DIAGNOSIS — M25612 Stiffness of left shoulder, not elsewhere classified: Secondary | ICD-10-CM

## 2021-06-12 DIAGNOSIS — N644 Mastodynia: Secondary | ICD-10-CM

## 2021-06-12 DIAGNOSIS — M6289 Other specified disorders of muscle: Secondary | ICD-10-CM

## 2021-06-12 DIAGNOSIS — M6281 Muscle weakness (generalized): Secondary | ICD-10-CM

## 2021-06-12 NOTE — Therapy (Signed)
[place], [place], [address] Phone: [phone]   Fax:  [phone]  Physical Therapy Treatment  Patient Details  Name: Stacey Singh MRN: [id number] Date of Birth: 1976/02/06 Referring Provider (PT): Wallace Going, DO   Encounter Date: 06/12/2021   PT End of Session - 06/12/21 1038     Visit Number 4    Number of Visits 7    Date for PT Re-Evaluation 06/21/21    Authorization Type BCBS    PT Start Time 0932    PT Stop Time 1028    PT Time Calculation (min) 56 min    Activity Tolerance Patient tolerated treatment well    Behavior During Therapy [facility] for tasks assessed/performed             Past Medical History:  Diagnosis Date   Allergy    Cancer ([place])    breast   Family history of breast cancer    Family history of leukemia    Heart murmur    in high school, resolved   History of COVID-19 08/05/2020   Hypertension     Past Surgical History:  Procedure Laterality Date   BREAST CYST EXCISION Bilateral 10/09/2020   Procedure: EXCISION OF EXCESS BREAST TISSUE;  Surgeon: Wallace Going, DO;  Location: [place];  Service: Plastics;  Laterality: Bilateral;   BREAST RECONSTRUCTION WITH PLACEMENT OF TISSUE EXPANDER AND FLEX HD (ACELLULAR HYDRATED DERMIS) Bilateral 03/13/2020   Procedure: BILATERAL BREAST RECONSTRUCTION WITH PLACEMENT OF TISSUE EXPANDERS AND FLEX HD (ACELLULAR HYDRATED DERMIS);  Surgeon: Wallace Going, DO;  Location: [place];  Service: Plastics;  Laterality: Bilateral;   CESAREAN SECTION  04/04/2012   Procedure: CESAREAN SECTION;  Surgeon: Farrel Gobble. Harrington Challenger, MD;  Location: [facility];  Service: Obstetrics;  Laterality: N/A;   CESAREAN SECTION N/A 05/19/2015   Procedure: CESAREAN SECTION;  Surgeon: Allyn Kenner, DO;  Location: [facility];  Service: Obstetrics;  Laterality: N/A;   CESAREAN SECTION     DILATION AND CURETTAGE OF UTERUS      LIPOSUCTION WITH LIPOFILLING Bilateral 10/09/2020   Procedure: LIPOSUCTION WITH LIPOFILLING;  Surgeon: Wallace Going, DO;  Location: [place];  Service: Plastics;  Laterality: Bilateral;  3 hours total, please   MASTECTOMY W/ SENTINEL NODE BIOPSY Bilateral 03/13/2020   Procedure: BILATERAL MASTECTOMY WITH RIGHT SENTINEL LYMPH NODE BIOPSY;  Surgeon: Jovita Kussmaul, MD;  Location: [place];  Service: General;  Laterality: Bilateral;  PEC BLOCK   REMOVAL OF BILATERAL TISSUE EXPANDERS WITH PLACEMENT OF BILATERAL BREAST IMPLANTS Bilateral 06/13/2020   Procedure: REMOVAL OF BILATERAL TISSUE EXPANDERS WITH PLACEMENT OF BILATERAL BREAST IMPLANTS;  Surgeon: Wallace Going, DO;  Location: [place];  Service: Plastics;  Laterality: Bilateral;   WISDOM TOOTH EXTRACTION      There were no vitals filed for this visit.   Subjective Assessment - 06/12/21 1037     Subjective I feel like I have more motion in my shoulder.  I still have tightness below my breast and around my front chest near my breast bone.  but overall I feel better    Pertinent History history of breast cancer    Patient Stated Goals "Less tightness, and less discomfort with bending."    Currently in Pain? No/denies    Pain Score 0-No pain    Pain Location Chest    Pain Orientation Right    Pain Onset More  than a month ago    Pain Frequency Intermittent                [facility] Adult PT Treatment/Exercise:   Therapeutic Exercise: - pec stretch with PT assist - Rt sleeper stretch 2 x 30 sec  Pt shown R sidelying posterior capsule stretch for home and landmarks to look for.  Elbow crease over nose   Manual Therapy: - STM/DTM/Trigger point release to Rt pectoral musculature, latissimus dorsi, subscapularis, over anterior rib below breast over TrP costo chondral arch on R and over sternal area over ribs over TrP over R - scar tissue mobilization.  - Rt shoulder PROM in  all planes pre and post TPDN  Post capsule stretch in R sidelying and then on Left sidelying with PT stabilizing scapula and Post capsule stretch   Neuromuscular re-ed: - n/a   Therapeutic Activity: - n/a  Modalities Moist heat over R shoulder and chest   Self-care/Home Management: - Reviewed TPDN indications, benefits, expectations, side effects; handout provided   Trigger Point Dry Needling Treatment: Pre-treatment instruction: Patient instructed on dry needling rationale, procedures, and possible side effects including pain during treatment (achy,cramping feeling), bruising, drop of blood, lightheadedness, nausea, sweating. Patient Consent Given: Yes Education handout provided: Previously provided Muscles treated: Rt pectoral musculature, latissimus dorsi, subscapularis, over anterior rib below breast over TrP costo chondral arch on R and over sternal area over ribs over TrP over R - scar tissue mobilization.  Needle size and number:  .30x41m    .30 x 733mElectrical stimulation performed: No Parameters: N/A Treatment response/outcome: Twitch response elicited, Palpable decrease in muscle tension, and increased motion with less pain Post-treatment instructions: Patient instructed to expect possible mild to moderate muscle soreness later today and/or tomorrow. Patient instructed in methods to reduce muscle soreness and to continue prescribed HEP. If patient was dry needled over the lung field, patient was instructed on signs and symptoms of pneumothorax and, however unlikely, to see immediate medical attention should they occur. Patient was also educated on signs and symptoms of infection and to seek medical attention should they occur. Patient verbalized understanding of these instructions and education.                        PT Short Term Goals - 05/07/21 1753       PT SHORT TERM GOAL #1   Title Patient will be independent with initial HEP.    Baseline  issued at eval.    Status New    Target Date 05/21/21               PT Long Term Goals - 05/07/21 1754       PT LONG TERM GOAL #1   Title Patient will report tightness/pinching at worst rated as 5/10 with bending to improve her tolerance to tying her shoes    Baseline 9/10 with bending    Status New    Target Date 06/18/21      PT LONG TERM GOAL #2   Title Patient will score at least 75% function on FOTO to signify clinically meaningful improvement in functional abilities.    Baseline 64%    Status New    Target Date 06/18/21      PT LONG TERM GOAL #3   Title Patient will demonstrate at least 170 degrees of Rt shoulder abduction AROM without reports of increased breast tightness to improve ability to complete overhead activities.  Baseline see flowsheet    Status New    Target Date 06/18/21      PT LONG TERM GOAL #4   Title Patient will be independent with advanced home program to maintain/progress current level of function.    Status New    Target Date 06/18/21                   Plan - 06/12/21 1033     Clinical Impression Statement Ms Pallas returns with decreased pain in R TrP post TPDN.  Pt with increased IR or R shoulder and notes improved motion as well.  Pt did complain of TrP over R costochondral arch and pt consented to TPDN and was closely monitored throughout session. Pt received manual scar tissue massage and STW/Myofascial work over same areas with TPDN. Pt with some tightness in post capsule and was able to perform stretch and increase AROM with stretching post TPDN.  Pt tolerated session with no adverse effects.    Personal Factors and Comorbidities Time since onset of injury/illness/exacerbation;Profession;Comorbidity 2    Comorbidities bilateral mastecomy, expanders with fills    Examination-Activity Limitations Reach Overhead;Bend    Examination-Participation Restrictions Shop;Laundry;Meal Prep;Occupation    PT Frequency 1x / week    PT  Duration 6 weeks    PT Treatment/Interventions ADLs/Self Care Home Management;Therapeutic activities;Therapeutic exercise;Neuromuscular re-education;Patient/family education;Manual techniques;Scar mobilization;Passive range of motion;Dry needling;Taping    PT Next Visit Plan manual to Rt breast and surrounding musculature thoracic mobility, sternal/clavicle mobility, posture, potential TPDN lat, pec, subscap, scar tissue    PT Home Exercise Plan Access Code: [email or web address]    Consulted and Agree with Plan of Care Patient             Patient will benefit from skilled therapeutic intervention in order to improve the following deficits and impairments:  Decreased range of motion, Increased fascial restricitons, Impaired UE functional use, Increased muscle spasms, Decreased skin integrity, Pain, Impaired flexibility, Decreased scar mobility, Postural dysfunction  Visit Diagnosis: Stiffness of right shoulder, not elsewhere classified  Breast pain, right  Abnormal increased muscle tightness  Abnormal posture  Aftercare following surgery for neoplasm  Stiffness of left shoulder, not elsewhere classified  Muscle weakness (generalized)     Problem List Patient Active Problem List   Diagnosis Date Noted   S/P breast reconstruction, bilateral 07/17/2020   Acquired absence of breast 03/22/2020   Breast cancer ([place]) 03/13/2020   Genetic testing 01/05/2020   Family history of breast cancer    Family history of leukemia    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 [place], [place], [address] Phone: [phone]   Fax:  [phone]  Name: MARYSSA GIAMPIETRO MRN: [id number] Date of Birth: [birth year]

## 2021-06-23 ENCOUNTER — Ambulatory Visit: Payer: BC Managed Care – PPO

## 2021-06-23 ENCOUNTER — Other Ambulatory Visit: Payer: Self-pay

## 2021-06-23 DIAGNOSIS — M25611 Stiffness of right shoulder, not elsewhere classified: Secondary | ICD-10-CM | POA: Diagnosis not present

## 2021-06-23 DIAGNOSIS — N644 Mastodynia: Secondary | ICD-10-CM

## 2021-06-23 DIAGNOSIS — R293 Abnormal posture: Secondary | ICD-10-CM

## 2021-06-23 DIAGNOSIS — M6289 Other specified disorders of muscle: Secondary | ICD-10-CM

## 2021-06-23 NOTE — Therapy (Signed)
[place], [place], [address] Phone: [phone]   Fax:  [phone]  Physical Therapy Treatment/Re-certification  Patient Details  Name: Stacey Singh MRN: [id number] Date of Birth: 01-10-1976 Referring Provider (PT): Wallace Going, DO   Encounter Date: 06/23/2021   PT End of Session - 06/23/21 0801     Visit Number 5    Number of Visits 17    Date for PT Re-Evaluation 08/09/21    Authorization Type BCBS    PT Start Time 0801    PT Stop Time 0848    PT Time Calculation (min) 47 min    Activity Tolerance Patient tolerated treatment well    Behavior During Therapy [facility] for tasks assessed/performed             Past Medical History:  Diagnosis Date   Allergy    Cancer ([place])    breast   Family history of breast cancer    Family history of leukemia    Heart murmur    in high school, resolved   History of COVID-19 08/05/2020   Hypertension     Past Surgical History:  Procedure Laterality Date   BREAST CYST EXCISION Bilateral 10/09/2020   Procedure: EXCISION OF EXCESS BREAST TISSUE;  Surgeon: Wallace Going, DO;  Location: [place];  Service: Plastics;  Laterality: Bilateral;   BREAST RECONSTRUCTION WITH PLACEMENT OF TISSUE EXPANDER AND FLEX HD (ACELLULAR HYDRATED DERMIS) Bilateral 03/13/2020   Procedure: BILATERAL BREAST RECONSTRUCTION WITH PLACEMENT OF TISSUE EXPANDERS AND FLEX HD (ACELLULAR HYDRATED DERMIS);  Surgeon: Wallace Going, DO;  Location: [place];  Service: Plastics;  Laterality: Bilateral;   CESAREAN SECTION  04/04/2012   Procedure: CESAREAN SECTION;  Surgeon: Farrel Gobble. Harrington Challenger, MD;  Location: [facility];  Service: Obstetrics;  Laterality: N/A;   CESAREAN SECTION N/A 05/19/2015   Procedure: CESAREAN SECTION;  Surgeon: Allyn Kenner, DO;  Location: [facility];  Service: Obstetrics;  Laterality: N/A;   CESAREAN SECTION     DILATION AND  CURETTAGE OF UTERUS     LIPOSUCTION WITH LIPOFILLING Bilateral 10/09/2020   Procedure: LIPOSUCTION WITH LIPOFILLING;  Surgeon: Wallace Going, DO;  Location: [place];  Service: Plastics;  Laterality: Bilateral;  3 hours total, please   MASTECTOMY W/ SENTINEL NODE BIOPSY Bilateral 03/13/2020   Procedure: BILATERAL MASTECTOMY WITH RIGHT SENTINEL LYMPH NODE BIOPSY;  Surgeon: Jovita Kussmaul, MD;  Location: [place];  Service: General;  Laterality: Bilateral;  PEC BLOCK   REMOVAL OF BILATERAL TISSUE EXPANDERS WITH PLACEMENT OF BILATERAL BREAST IMPLANTS Bilateral 06/13/2020   Procedure: REMOVAL OF BILATERAL TISSUE EXPANDERS WITH PLACEMENT OF BILATERAL BREAST IMPLANTS;  Surgeon: Wallace Going, DO;  Location: [place];  Service: Plastics;  Laterality: Bilateral;   WISDOM TOOTH EXTRACTION      There were no vitals filed for this visit.   Subjective Assessment - 06/23/21 0802     Subjective Patient reports she was feeling really good for about a week since last session, but on Thursday she started having tightness about her Rt chest. She is experiencing occasional sharp pain across the chest, but she is unsure what caused this pain/tightness since last session. She is trying to think if there was any lifting activity that caused her pain or potentially coughing due to a cold or bowling. She reports it has been constant since Thursday along the lateral and anterior chest, but has improved since  Thursday.    Currently in Pain? Yes    Pain Score 4     Pain Location Chest    Pain Orientation Right    Pain Descriptors / Indicators Tightness;Numbness    Pain Type Chronic pain    Pain Onset More than a month ago    Pain Frequency Constant    Aggravating Factors  "I don't know."    Pain Relieving Factors massage                [facility] PT Assessment - 06/23/21 0001       Assessment   Medical Diagnosis Z98.890 (ICD-10-CM) - S/P breast  reconstruction, bilateral    Referring Provider (PT) Dillingham, Loel Lofty, DO      Observation/Other Assessments   Focus on Therapeutic Outcomes (FOTO)  68% function      Posture/Postural Control   Postural Limitations Rounded Shoulders;Forward head;Increased thoracic kyphosis      AROM   Overall AROM Comments thoracic flexion/extension 50% limited with anterior chest discomfort when thoracic flexion; thoracic rotation and lateral flexion WNL    Right Shoulder Flexion 180 Degrees    Right Shoulder ABduction 155 Degrees   tightness; limited scapular movement when assessed in standing   Right Shoulder Internal Rotation 65 Degrees   tightness   Right Shoulder External Rotation 90 Degrees    Left Shoulder Internal Rotation 75 Degrees    Left Shoulder External Rotation 90 Degrees      Strength   Overall Strength Comments Bilateral shoulder strength 5/5, discomfort with Rt shoulder IR and horizontal adduction and latissimus dorsi      Palpation   Spinal mobility T-spine hypomobility and painful mid T-spine    Palpation comment TTP coracoid process, clavicle, pec minor. rhomboids           [facility] Adult PT Treatment/Exercise:   Therapeutic Exercise: - diaphragmatic breathing 1 x 5 cues for proper rib expansion - seated thoracic extension 1 x 5  - HEP updated to include these exercises    Manual Therapy: - Rt rib PA moblizations 3-8 - T-spine CPAs grade II-III  - STM/DTM/Trigger point release Rt rhomboids - Rt scapula mobilization inferior/superior and rotation   Neuromuscular re-ed: - n/a   Therapeutic Activity: - Education on re-assessment findings including lingering deficits (shoulder and trunk ROM, shoulder strength, chest mobility, soft tissue restriction, FOTO outcome score) that will be addressed at future sessions. Education on updated POC.    Modalities N/A   Self-care/Home Management: - N/A                                                       PT Short Term Goals - 06/23/21 7829       PT SHORT TERM GOAL #1   Title Patient will be independent with initial HEP.    Baseline issued at eval.    Status Achieved    Target Date 05/21/21               PT Long Term Goals - 06/23/21 0815       PT LONG TERM GOAL #1   Title Patient will report tightness/pinching at worst rated as 5/10 with bending to improve her tolerance to tying her shoes    Baseline 9/10 with bending    Status On-going  PT LONG TERM GOAL #2   Title Patient will score at least 75% function on FOTO to signify clinically meaningful improvement in functional abilities.    Baseline 64%; 68% 11/28    Status On-going      PT LONG TERM GOAL #3   Title Patient will demonstrate at least 170 degrees of Rt shoulder abduction AROM without reports of increased breast tightness to improve ability to complete overhead activities.    Baseline see flowsheet    Status On-going      PT LONG TERM GOAL #4   Title Patient will be independent with advanced home program to maintain/progress current level of function.    Baseline progressing as appropriate    Status On-going      PT LONG TERM GOAL #5   Title Patient will demonstrate pain free Rt shoulder MMT to improve ability to complete recreational activity.    Baseline pain and tightness with MMT of shoulder IR, shoulder horizontal adduction, and latissismus dorsi    Status New    Target Date 08/04/21                   Plan - 06/23/21 0905     Clinical Impression Statement Zorana is making gradual progress in PT reporting short term relief of her Rt chest tightness/pain following sessions that last anywhere from a few days to a week. Overall she has good strength about the Rt shoulder, though reports increased pain and tightness with strength testing of the subscapularis, pectorals, and latissimus dorsi. She continues to report diffuse tenderness about  anterolateral right chest, though has minimal tenderness/tautness about the pec major and latissimus dorsi at this time, but palpable tenderness present about coracoid process, pec minor, rhomboids, clavicle, and thoracic spinous processes. Her right shoulder IR AROM has improved compared to baseline and her shoulder abduction AROM remains relatively unchanged  with notable limited scapular mobility when performing shoulder abduction. She is also noted to have limited thoracic flexion and extension AROM reporting increased chest discomfort when performing thoracic flexion. With diaphragmatic breathing she has extreme difficulty expanding at the lower ribcage. She will benefit from continued PT 2xweek for 6 weeks to further address her soft tissue and joint restrictions, improve her chest wall mobility, increase her shoulder and thoracic ROM, and improve her Rt shoulder endurance in order to optimize her function.    Personal Factors and Comorbidities Time since onset of injury/illness/exacerbation;Profession;Comorbidity 2    Comorbidities bilateral mastecomy, expanders with fills    Examination-Activity Limitations Reach Overhead;Bend    Examination-Participation Restrictions Shop;Laundry;Meal Prep;Occupation    PT Frequency 2x / week    PT Duration 6 weeks    PT Treatment/Interventions ADLs/Self Care Home Management;Therapeutic activities;Therapeutic exercise;Neuromuscular re-education;Patient/family education;Manual techniques;Scar mobilization;Passive range of motion;Dry needling;Taping;Spinal Manipulations;Moist Heat;Cryotherapy    PT Next Visit Plan manual to Rt breast and surrounding musculature thoracic mobility, sternal/clavicle mobility, posture, potential TPDN lat, pec, subscap, scar tissue    PT Home Exercise Plan Access Code: [email or web address]    Consulted and Agree with Plan of Care Patient             Patient will benefit from skilled therapeutic intervention in order to improve the following  deficits and impairments:  Decreased range of motion, Increased fascial restricitons, Impaired UE functional use, Increased muscle spasms, Decreased skin integrity, Pain, Impaired flexibility, Decreased scar mobility, Postural dysfunction  Visit Diagnosis: Stiffness of right shoulder, not elsewhere classified  Breast pain, right  Abnormal increased muscle tightness  Abnormal posture     Problem List Patient Active Problem List   Diagnosis Date Noted   S/P breast reconstruction, bilateral 07/17/2020   Acquired absence of breast 03/22/2020   Breast cancer ([place]) 03/13/2020   Genetic testing 01/05/2020   Family history of breast cancer    Family history of leukemia    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

## 2021-06-30 ENCOUNTER — Ambulatory Visit: Payer: BC Managed Care – PPO | Attending: Plastic Surgery

## 2021-06-30 ENCOUNTER — Other Ambulatory Visit: Payer: Self-pay

## 2021-06-30 DIAGNOSIS — M25611 Stiffness of right shoulder, not elsewhere classified: Secondary | ICD-10-CM | POA: Diagnosis not present

## 2021-06-30 DIAGNOSIS — M6281 Muscle weakness (generalized): Secondary | ICD-10-CM | POA: Diagnosis present

## 2021-06-30 DIAGNOSIS — M6289 Other specified disorders of muscle: Secondary | ICD-10-CM | POA: Diagnosis present

## 2021-06-30 DIAGNOSIS — N644 Mastodynia: Secondary | ICD-10-CM | POA: Diagnosis present

## 2021-06-30 DIAGNOSIS — Z483 Aftercare following surgery for neoplasm: Secondary | ICD-10-CM | POA: Diagnosis present

## 2021-06-30 DIAGNOSIS — M25612 Stiffness of left shoulder, not elsewhere classified: Secondary | ICD-10-CM | POA: Insufficient documentation

## 2021-06-30 DIAGNOSIS — R293 Abnormal posture: Secondary | ICD-10-CM | POA: Diagnosis present

## 2021-06-30 NOTE — Therapy (Signed)
[place], [place], [address] Phone: [phone]   Fax:  [phone]  Physical Therapy Treatment  Patient Details  Name: Stacey Singh MRN: [id number] Date of Birth: 04-24-1976 Referring Provider (PT): Wallace Going, DO   Encounter Date: 06/30/2021   PT End of Session - 06/30/21 0800     Visit Number 6    Number of Visits 17    Date for PT Re-Evaluation 08/09/21    Authorization Type BCBS    PT Start Time 0800    PT Stop Time 0845   5 minutes TPDN   PT Time Calculation (min) 45 min    Activity Tolerance Patient tolerated treatment well    Behavior During Therapy [facility] for tasks assessed/performed             Past Medical History:  Diagnosis Date   Allergy    Cancer ([place])    breast   Family history of breast cancer    Family history of leukemia    Heart murmur    in high school, resolved   History of COVID-19 08/05/2020   Hypertension     Past Surgical History:  Procedure Laterality Date   BREAST CYST EXCISION Bilateral 10/09/2020   Procedure: EXCISION OF EXCESS BREAST TISSUE;  Surgeon: Wallace Going, DO;  Location: [place] [facility];  Service: Plastics;  Laterality: Bilateral;   BREAST RECONSTRUCTION WITH PLACEMENT OF TISSUE EXPANDER AND FLEX HD (ACELLULAR HYDRATED DERMIS) Bilateral 03/13/2020   Procedure: BILATERAL BREAST RECONSTRUCTION WITH PLACEMENT OF TISSUE EXPANDERS AND FLEX HD (ACELLULAR HYDRATED DERMIS);  Surgeon: Wallace Going, DO;  Location: [place];  Service: Plastics;  Laterality: Bilateral;   CESAREAN SECTION  04/04/2012   Procedure: CESAREAN SECTION;  Surgeon: Farrel Gobble. Harrington Challenger, MD;  Location: [facility];  Service: Obstetrics;  Laterality: N/A;   CESAREAN SECTION N/A 05/19/2015   Procedure: CESAREAN SECTION;  Surgeon: Allyn Kenner, DO;  Location: [facility];  Service: Obstetrics;  Laterality: N/A;   CESAREAN SECTION     DILATION AND  CURETTAGE OF UTERUS     LIPOSUCTION WITH LIPOFILLING Bilateral 10/09/2020   Procedure: LIPOSUCTION WITH LIPOFILLING;  Surgeon: Wallace Going, DO;  Location: [facility];  Service: Plastics;  Laterality: Bilateral;  3 hours total, please   MASTECTOMY W/ SENTINEL NODE BIOPSY Bilateral 03/13/2020   Procedure: BILATERAL MASTECTOMY WITH RIGHT SENTINEL LYMPH NODE BIOPSY;  Surgeon: Jovita Kussmaul, MD;  Location: [facility];  Service: General;  Laterality: Bilateral;  PEC BLOCK   REMOVAL OF BILATERAL TISSUE EXPANDERS WITH PLACEMENT OF BILATERAL BREAST IMPLANTS Bilateral 06/13/2020   Procedure: REMOVAL OF BILATERAL TISSUE EXPANDERS WITH PLACEMENT OF BILATERAL BREAST IMPLANTS;  Surgeon: Wallace Going, DO;  Location: [place];  Service: Plastics;  Laterality: Bilateral;   WISDOM TOOTH EXTRACTION      There were no vitals filed for this visit.   Subjective Assessment - 06/30/21 0801     Subjective "I feel like yesterday I turned a corner, in a good way. It's not as bad."    Currently in Pain? No/denies              [facility] Adult PT Treatment/Exercise:   Therapeutic Exercise: - thoracic extension on foam roller x 10   - shoulder horizontal and diagonal resistance yellow band 1 x 10  - pec stretch on roller 60 sec - resisted shoulder ER and IR green band RUE 1  x 10 each  - open book 1 x 5 each with diaphragmatic breathing - diaphragmatic breathing 1 x 10 with therapist assist on rib cage for expansion  - updated HEP   Manual Therapy: - STM/DTM Rt pectorals, rhomboids - CPAs T3-T7 grade II-III - discussed and demonstrated utilizing tennis ball for self-soft tissue mobilization for periscapular musculature.    Neuromuscular re-ed: - n/a   Therapeutic Activity: - n/a   Modalities - n/a    Self-care/Home Management: - N/A  Trigger Point Dry Needling Treatment: Pre-treatment instruction: Patient instructed on dry needling  rationale, procedures, and possible side effects including pain during treatment (achy,cramping feeling), bruising, drop of blood, lightheadedness, nausea, sweating. Patient Consent Given: Yes Education handout provided: Previously provided Muscles treated: Rt pec minor, Rt rhomboids (shelf technique), T4-5 thoracic multifidi Rt  Treatment response/outcome: Palpable decrease in muscle tension Post-treatment instructions: Patient instructed to expect possible mild to moderate muscle soreness later today and/or tomorrow. Patient instructed in methods to reduce muscle soreness and to continue prescribed HEP. If patient was dry needled over the lung field, patient was instructed on signs and symptoms of pneumothorax and, however unlikely, to see immediate medical attention should they occur. Patient was also educated on signs and symptoms of infection and to seek medical attention should they occur. Patient verbalized understanding of these instructions and education.                             PT Short Term Goals - 06/23/21 8841       PT SHORT TERM GOAL #1   Title Patient will be independent with initial HEP.    Baseline issued at eval.    Status Achieved    Target Date 05/21/21               PT Long Term Goals - 06/23/21 0815       PT LONG TERM GOAL #1   Title Patient will report tightness/pinching at worst rated as 5/10 with bending to improve her tolerance to tying her shoes    Baseline 9/10 with bending    Status On-going      PT LONG TERM GOAL #2   Title Patient will score at least 75% function on FOTO to signify clinically meaningful improvement in functional abilities.    Baseline 64%; 68% 11/28    Status On-going      PT LONG TERM GOAL #3   Title Patient will demonstrate at least 170 degrees of Rt shoulder abduction AROM without reports of increased breast tightness to improve ability to complete overhead activities.    Baseline see flowsheet     Status On-going      PT LONG TERM GOAL #4   Title Patient will be independent with advanced home program to maintain/progress current level of function.    Baseline progressing as appropriate    Status On-going      PT LONG TERM GOAL #5   Title Patient will demonstrate pain free Rt shoulder MMT to improve ability to complete recreational activity.    Baseline pain and tightness with MMT of shoulder IR, shoulder horizontal adduction, and latissismus dorsi    Status New    Target Date 08/04/21                   Plan - 06/30/21 0825     Clinical Impression Statement Patient tolerated session well today which focused on thoracic mobility and  introduction to shoulder strengthening. Patinet reported less palpable tenderness in pectorals and rhomboids following TPDN with a palpable decrease in tautness. She continues to have hypomobility and pain with thoracic mobilizations. Improved ability to expand ribcage with diaphragmatic breathing, though has difficulty with medial expansion at this time. Able to introduce rotator cuff strengthening with patient quickly fatiguing, though no reports of pain.    Personal Factors and Comorbidities Time since onset of injury/illness/exacerbation;Profession;Comorbidity 2    Comorbidities bilateral mastecomy, expanders with fills    Examination-Activity Limitations Reach Overhead;Bend    Examination-Participation Restrictions Shop;Laundry;Meal Prep;Occupation    PT Frequency 2x / week    PT Duration 6 weeks    PT Treatment/Interventions ADLs/Self Care Home Management;Therapeutic activities;Therapeutic exercise;Neuromuscular re-education;Patient/family education;Manual techniques;Scar mobilization;Passive range of motion;Dry needling;Taping;Spinal Manipulations;Moist Heat;Cryotherapy    PT Next Visit Plan manual to Rt breast and surrounding musculature thoracic mobility, sternal/clavicle mobility, posture, potential TPDN rhomboids/multifidi, diaphragmatic  breathing, shoulder/chest strengthening.    PT Home Exercise Plan Access Code: [email or web address]    Consulted and Agree with Plan of Care Patient             Patient will benefit from skilled therapeutic intervention in order to improve the following deficits and impairments:  Decreased range of motion, Increased fascial restricitons, Impaired UE functional use, Increased muscle spasms, Decreased skin integrity, Pain, Impaired flexibility, Decreased scar mobility, Postural dysfunction  Visit Diagnosis: Stiffness of right shoulder, not elsewhere classified  Breast pain, right  Abnormal increased muscle tightness  Abnormal posture     Problem List Patient Active Problem List   Diagnosis Date Noted   S/P breast reconstruction, bilateral 07/17/2020   Acquired absence of breast 03/22/2020   Breast cancer ([place]) 03/13/2020   Genetic testing 01/05/2020   Family history of breast cancer    Family history of leukemia    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

## 2021-07-03 ENCOUNTER — Other Ambulatory Visit: Payer: Self-pay

## 2021-07-03 ENCOUNTER — Encounter: Payer: Self-pay | Admitting: Physical Therapy

## 2021-07-03 ENCOUNTER — Ambulatory Visit: Payer: BC Managed Care – PPO | Admitting: Physical Therapy

## 2021-07-03 DIAGNOSIS — M25611 Stiffness of right shoulder, not elsewhere classified: Secondary | ICD-10-CM | POA: Diagnosis not present

## 2021-07-03 DIAGNOSIS — M25612 Stiffness of left shoulder, not elsewhere classified: Secondary | ICD-10-CM

## 2021-07-03 DIAGNOSIS — R293 Abnormal posture: Secondary | ICD-10-CM

## 2021-07-03 DIAGNOSIS — M6289 Other specified disorders of muscle: Secondary | ICD-10-CM

## 2021-07-03 DIAGNOSIS — N644 Mastodynia: Secondary | ICD-10-CM

## 2021-07-03 DIAGNOSIS — Z483 Aftercare following surgery for neoplasm: Secondary | ICD-10-CM

## 2021-07-03 DIAGNOSIS — M6281 Muscle weakness (generalized): Secondary | ICD-10-CM

## 2021-07-03 LAB — COLOGUARD: COLOGUARD: NEGATIVE

## 2021-07-03 LAB — EXTERNAL GENERIC LAB PROCEDURE: COLOGUARD: NEGATIVE

## 2021-07-03 NOTE — Therapy (Signed)
[place], [place], [address] Phone: [phone]   Fax:  [phone]  Physical Therapy Treatment  Patient Details  Name: Stacey Singh MRN: [id number] Date of Birth: May 14, 1976 Referring Provider (PT): Wallace Going, DO   Encounter Date: 07/03/2021   PT End of Session - 07/03/21 0801     Visit Number 7    Number of Visits 17    Date for PT Re-Evaluation 08/09/21    Authorization Type BCBS    PT Start Time 0802    PT Stop Time 0855    PT Time Calculation (min) 53 min    Activity Tolerance Patient tolerated treatment well    Behavior During Therapy [facility] for tasks assessed/performed             Past Medical History:  Diagnosis Date   Allergy    Cancer ([place])    breast   Family history of breast cancer    Family history of leukemia    Heart murmur    in high school, resolved   History of COVID-19 08/05/2020   Hypertension     Past Surgical History:  Procedure Laterality Date   BREAST CYST EXCISION Bilateral 10/09/2020   Procedure: EXCISION OF EXCESS BREAST TISSUE;  Surgeon: Wallace Going, DO;  Location: [place];  Service: Plastics;  Laterality: Bilateral;   BREAST RECONSTRUCTION WITH PLACEMENT OF TISSUE EXPANDER AND FLEX HD (ACELLULAR HYDRATED DERMIS) Bilateral 03/13/2020   Procedure: BILATERAL BREAST RECONSTRUCTION WITH PLACEMENT OF TISSUE EXPANDERS AND FLEX HD (ACELLULAR HYDRATED DERMIS);  Surgeon: Wallace Going, DO;  Location: [place];  Service: Plastics;  Laterality: Bilateral;   CESAREAN SECTION  04/04/2012   Procedure: CESAREAN SECTION;  Surgeon: Farrel Gobble. Harrington Challenger, MD;  Location: [facility];  Service: Obstetrics;  Laterality: N/A;   CESAREAN SECTION N/A 05/19/2015   Procedure: CESAREAN SECTION;  Surgeon: Allyn Kenner, DO;  Location: [facility];  Service: Obstetrics;  Laterality: N/A;   CESAREAN SECTION     DILATION AND CURETTAGE OF UTERUS      LIPOSUCTION WITH LIPOFILLING Bilateral 10/09/2020   Procedure: LIPOSUCTION WITH LIPOFILLING;  Surgeon: Wallace Going, DO;  Location: [place];  Service: Plastics;  Laterality: Bilateral;  3 hours total, please   MASTECTOMY W/ SENTINEL NODE BIOPSY Bilateral 03/13/2020   Procedure: BILATERAL MASTECTOMY WITH RIGHT SENTINEL LYMPH NODE BIOPSY;  Surgeon: Jovita Kussmaul, MD;  Location: [place];  Service: General;  Laterality: Bilateral;  PEC BLOCK   REMOVAL OF BILATERAL TISSUE EXPANDERS WITH PLACEMENT OF BILATERAL BREAST IMPLANTS Bilateral 06/13/2020   Procedure: REMOVAL OF BILATERAL TISSUE EXPANDERS WITH PLACEMENT OF BILATERAL BREAST IMPLANTS;  Surgeon: Wallace Going, DO;  Location: [place];  Service: Plastics;  Laterality: Bilateral;   WISDOM TOOTH EXTRACTION      There were no vitals filed for this visit.   Subjective Assessment - 07/03/21 0804     Subjective During Thanksgiving, I went bowling and played top golf over thanksgiving and went backward for a bit.  But I am progressing forward.  My back is not as sore as last time from [facility] TPDN and manual    Pertinent History history of breast cancer    Patient Stated Goals "Less tightness, and less discomfort with bending."    Currently in Pain? Yes    Pain Score 3     Pain Location Chest    Pain Orientation Right  Pain Descriptors / Indicators Tightness;Numbness    Pain Type Chronic pain               [facility] Adult PT Treatment/Exercise:   Therapeutic Exercise: - shoulder horizontal and diagonal resistance red band 2 x 10 start pattern  -resisted shoulder ER and IR green band RUE 1 x 10 each  -open book 1 x 5 each with diaphragmatic breathing - diaphragmatic breathing 1 x 10 with therapist assist / utilizing bed sheet aroung rib cage on rib cage for expansion  - seated on chair, towel b/t knees for adduction with hands together for prayer with rotation to L and R with  5  sec hold and deep breath  Manual Therapy: - STM/DTM Rt pectorals, rhomboids, R front chest over sternal /rib 3-5  Left subscap. Left upper trap. Left serratus  - Myofascial stretch of serratus bil with pt diagonal rotation of UE - CPAs T3-T7 grade II-III - suggested possible sources for foam roller to do previously taught exercises    Neuromuscular re-ed: - n/a   Therapeutic Activity: - n/a   Modalities - moist heat pack over sternum post session 10 min   Self-care/Home Management: - N/A   Trigger Point Dry Needling Treatment: Pre-treatment instruction: Patient instructed on dry needling rationale, procedures, and possible side effects including pain during treatment (achy,cramping feeling), bruising, drop of blood, lightheadedness, nausea, sweating. Patient Consent Given: Yes Education handout provided: Previously provided Muscles treated: Rt pec minor, Rt rhomboids (shelf technique), T3- T-7  thoracic multifidi  bil, R front chest over sternal /rib 3-5 with marked twitch response  Left subscap. Left upper trap. Left serratus /rib isolating Treatment response/outcome: Palpable decrease in muscle tension, marked twitch response over sub scap and upper trap Post-treatment instructions: Patient instructed to expect possible mild to moderate muscle soreness later today and/or tomorrow. Patient instructed in methods to reduce muscle soreness and to continue prescribed HEP. If patient was dry needled over the lung field, patient was instructed on signs and symptoms of pneumothorax and, however unlikely, to see immediate medical attention should they occur. Patient was also educated on signs and symptoms of infection and to seek medical attention should they occur. Patient verbalized understanding of these instructions and education.      Not performed today: thoracic extension on foam roller x 10   - pec stretch on roller 60 sec                       PT Short  Term Goals - 06/23/21 9833       PT SHORT TERM GOAL #1   Title Patient will be independent with initial HEP.    Baseline issued at eval.    Status Achieved    Target Date 05/21/21               PT Long Term Goals - 06/23/21 0815       PT LONG TERM GOAL #1   Title Patient will report tightness/pinching at worst rated as 5/10 with bending to improve her tolerance to tying her shoes    Baseline 9/10 with bending    Status On-going      PT LONG TERM GOAL #2   Title Patient will score at least 75% function on FOTO to signify clinically meaningful improvement in functional abilities.    Baseline 64%; 68% 11/28    Status On-going      PT LONG TERM GOAL #3   Title Patient  will demonstrate at least 170 degrees of Rt shoulder abduction AROM without reports of increased breast tightness to improve ability to complete overhead activities.    Baseline see flowsheet    Status On-going      PT LONG TERM GOAL #4   Title Patient will be independent with advanced home program to maintain/progress current level of function.    Baseline progressing as appropriate    Status On-going      PT LONG TERM GOAL #5   Title Patient will demonstrate pain free Rt shoulder MMT to improve ability to complete recreational activity.    Baseline pain and tightness with MMT of shoulder IR, shoulder horizontal adduction, and latissismus dorsi    Status New    Target Date 08/04/21                   Plan - 07/03/21 0920     Clinical Impression Statement Ms Trabert returns to clinic today with 3/10 pain and more aware of lack of rotation in torso and need for diaphragmatic breathing during the day and with rotation of exercises. Pt points out Left sided scapular and upper back trigger points along with previously addressed TPDN sites.  Worked on Left side to enhance rotation. also worked on thoracic mobility using TPDN and manual mobs and assisted stretch in diagonals. Pt with increased thoracic  rotational ability after RX session. Rotation to L> R post session.  Pt will continue benefitting from increasing strengthening to combat tight feeling post needling.  Pt did have increased pain in R TPDN over sternum rib junction post needling but alleviated with heat and exercises post Manual/TPDN.  Will cont POC    Personal Factors and Comorbidities Time since onset of injury/illness/exacerbation;Profession;Comorbidity 2    Comorbidities bilateral mastecomy, expanders with fills    Examination-Activity Limitations Reach Overhead;Bend    Examination-Participation Restrictions Shop;Laundry;Meal Prep;Occupation    PT Frequency 2x / week    PT Duration 6 weeks    PT Treatment/Interventions ADLs/Self Care Home Management;Therapeutic activities;Therapeutic exercise;Neuromuscular re-education;Patient/family education;Manual techniques;Scar mobilization;Passive range of motion;Dry needling;Taping;Spinal Manipulations;Moist Heat;Cryotherapy    PT Next Visit Plan manual to Rt breast and surrounding musculature thoracic mobility, sternal/clavicle mobility, posture, potential TPDN rhomboids/multifidi, diaphragmatic breathing, shoulder/chest strengthening.    PT Home Exercise Plan Access Code: [email or web address]    Consulted and Agree with Plan of Care Patient             Patient will benefit from skilled therapeutic intervention in order to improve the following deficits and impairments:  Decreased range of motion, Increased fascial restricitons, Impaired UE functional use, Increased muscle spasms, Decreased skin integrity, Pain, Impaired flexibility, Decreased scar mobility, Postural dysfunction  Visit Diagnosis: Stiffness of right shoulder, not elsewhere classified  Breast pain, right  Abnormal increased muscle tightness  Abnormal posture  Aftercare following surgery for neoplasm  Stiffness of left shoulder, not elsewhere classified  Muscle weakness (generalized)     Problem List Patient Active  Problem List   Diagnosis Date Noted   S/P breast reconstruction, bilateral 07/17/2020   Acquired absence of breast 03/22/2020   Breast cancer ([place]) 03/13/2020   Genetic testing 01/05/2020   Family history of breast cancer    Family history of leukemia    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 [place], [place], [address] Phone: [phone]   Fax:  [phone]  Name: Stacey Singh MRN: [id number] Date of Birth: 1975/09/17

## 2021-07-07 ENCOUNTER — Other Ambulatory Visit: Payer: Self-pay

## 2021-07-07 ENCOUNTER — Ambulatory Visit: Payer: BC Managed Care – PPO

## 2021-07-07 DIAGNOSIS — R293 Abnormal posture: Secondary | ICD-10-CM

## 2021-07-07 DIAGNOSIS — N644 Mastodynia: Secondary | ICD-10-CM

## 2021-07-07 DIAGNOSIS — M25611 Stiffness of right shoulder, not elsewhere classified: Secondary | ICD-10-CM

## 2021-07-07 DIAGNOSIS — M6289 Other specified disorders of muscle: Secondary | ICD-10-CM

## 2021-07-07 NOTE — Therapy (Signed)
[place], [place], [address] Phone: [phone]   Fax:  [phone]  Physical Therapy Treatment  Patient Details  Name: Stacey Singh MRN: [id number] Date of Birth: 05/01/1976 Referring Provider (PT): Wallace Going, DO   Encounter Date: 07/07/2021   PT End of Session - 07/07/21 0720     Visit Number 8    Number of Visits 17    Date for PT Re-Evaluation 08/09/21    Authorization Type BCBS    PT Start Time 0720    PT Stop Time 4174   5 minutes TPDN; 10 minutes MHP   PT Time Calculation (min) 75 min    Activity Tolerance Patient tolerated treatment well    Behavior During Therapy [facility] for tasks assessed/performed             Past Medical History:  Diagnosis Date   Allergy    Cancer ([place])    breast   Family history of breast cancer    Family history of leukemia    Heart murmur    in high school, resolved   History of COVID-19 08/05/2020   Hypertension     Past Surgical History:  Procedure Laterality Date   BREAST CYST EXCISION Bilateral 10/09/2020   Procedure: EXCISION OF EXCESS BREAST TISSUE;  Surgeon: Wallace Going, DO;  Location: [place];  Service: Plastics;  Laterality: Bilateral;   BREAST RECONSTRUCTION WITH PLACEMENT OF TISSUE EXPANDER AND FLEX HD (ACELLULAR HYDRATED DERMIS) Bilateral 03/13/2020   Procedure: BILATERAL BREAST RECONSTRUCTION WITH PLACEMENT OF TISSUE EXPANDERS AND FLEX HD (ACELLULAR HYDRATED DERMIS);  Surgeon: Wallace Going, DO;  Location: [place];  Service: Plastics;  Laterality: Bilateral;   CESAREAN SECTION  04/04/2012   Procedure: CESAREAN SECTION;  Surgeon: Farrel Gobble. Harrington Challenger, MD;  Location: [facility];  Service: Obstetrics;  Laterality: N/A;   CESAREAN SECTION N/A 05/19/2015   Procedure: CESAREAN SECTION;  Surgeon: Allyn Kenner, DO;  Location: [facility];  Service: Obstetrics;  Laterality: N/A;   CESAREAN SECTION      DILATION AND CURETTAGE OF UTERUS     LIPOSUCTION WITH LIPOFILLING Bilateral 10/09/2020   Procedure: LIPOSUCTION WITH LIPOFILLING;  Surgeon: Wallace Going, DO;  Location: [place];  Service: Plastics;  Laterality: Bilateral;  3 hours total, please   MASTECTOMY W/ SENTINEL NODE BIOPSY Bilateral 03/13/2020   Procedure: BILATERAL MASTECTOMY WITH RIGHT SENTINEL LYMPH NODE BIOPSY;  Surgeon: Jovita Kussmaul, MD;  Location: [place];  Service: General;  Laterality: Bilateral;  PEC BLOCK   REMOVAL OF BILATERAL TISSUE EXPANDERS WITH PLACEMENT OF BILATERAL BREAST IMPLANTS Bilateral 06/13/2020   Procedure: REMOVAL OF BILATERAL TISSUE EXPANDERS WITH PLACEMENT OF BILATERAL BREAST IMPLANTS;  Surgeon: Wallace Going, DO;  Location: [place];  Service: Plastics;  Laterality: Bilateral;   WISDOM TOOTH EXTRACTION      There were no vitals filed for this visit.   Subjective Assessment - 07/07/21 0722     Subjective "Better, but still tight."    Currently in Pain? Yes    Pain Score 3     Pain Location Chest    Pain Orientation Right;Anterior    Pain Descriptors / Indicators Tightness    Pain Type Chronic pain    Pain Onset More than a month ago    Pain Frequency Constant              [facility] Adult PT Treatment/Exercise:  Therapeutic Exercise: - supine lat pull 2 x 10 5# dumbbell - resisted adduction RUE 2 x 10 green band  - D2 resisted extension red band 1 x 10 bilateral - added supine lat pull to HEP.    Manual Therapy: - STM/DTM Rt pectorals,upper trap trap rhomboids, subscapularis, R front chest over sternal /rib 3-5   - CPAs T3-T10 grade III-V (manipulation performed over foam roller to thoracic spine)  - Rt clavicular mobilization grade II-III superior, inferior, A/P   Modalities - moist heat pack over Rt upper trap 10 minutes      [facility] PT Assessment - 07/07/21 0001       AROM   Right Shoulder ABduction 166 Degrees               Trigger Point Dry Needling Treatment: Pre-treatment instruction: Patient instructed on dry needling rationale, procedures, and possible side effects including pain during treatment (achy,cramping feeling), bruising, drop of blood, lightheadedness, nausea, sweating. Patient Consent Given: Yes Education handout provided: Previously provided Muscles treated: Rt subscapularis, rhomboid shelf technique, and upper trap   Treatment response/outcome: Twitch response elicited and Palpable decrease in muscle tension Post-treatment instructions: Patient instructed to expect possible mild to moderate muscle soreness later today and/or tomorrow. Patient instructed in methods to reduce muscle soreness and to continue prescribed HEP. If patient was dry needled over the lung field, patient was instructed on signs and symptoms of pneumothorax and, however unlikely, to see immediate medical attention should they occur. Patient was also educated on signs and symptoms of infection and to seek medical attention should they occur. Patient verbalized understanding of these instructions and education.                         PT Short Term Goals - 06/23/21 1696       PT SHORT TERM GOAL #1   Title Patient will be independent with initial HEP.    Baseline issued at eval.    Status Achieved    Target Date 05/21/21               PT Long Term Goals - 06/23/21 0815       PT LONG TERM GOAL #1   Title Patient will report tightness/pinching at worst rated as 5/10 with bending to improve her tolerance to tying her shoes    Baseline 9/10 with bending    Status On-going      PT LONG TERM GOAL #2   Title Patient will score at least 75% function on FOTO to signify clinically meaningful improvement in functional abilities.    Baseline 64%; 68% 11/28    Status On-going      PT LONG TERM GOAL #3   Title Patient will demonstrate at least 170 degrees of Rt shoulder abduction AROM  without reports of increased breast tightness to improve ability to complete overhead activities.    Baseline see flowsheet    Status On-going      PT LONG TERM GOAL #4   Title Patient will be independent with advanced home program to maintain/progress current level of function.    Baseline progressing as appropriate    Status On-going      PT LONG TERM GOAL #5   Title Patient will demonstrate pain free Rt shoulder MMT to improve ability to complete recreational activity.    Baseline pain and tightness with MMT of shoulder IR, shoulder horizontal adduction, and latissismus dorsi    Status New  Target Date 08/04/21                   Plan - 07/07/21 0804     Clinical Impression Statement Parris tolerated session well today with manual therapy continuing to focus on chest/thoracic mobility and improving muscular restriction in this region. Multiple cavitations of the thoracic spine noted with manipulation with patient having improved mobility and a decrease in pain with PAIVM of the thoracic spine following this intervention. Her Rt shoulder abduction AROM has improved compared to baseline achieving 166 degrees today. Able to progress shoulder strengthening with patient reporting apprehension with going through full range of strengthening initially, though with continued reps able to complete through full range. She reported occasional discomfort in Rt upper trap with strengthening, though this is likley from [facility] that was performed at beginning of session.    Personal Factors and Comorbidities Time since onset of injury/illness/exacerbation;Profession;Comorbidity 2    Comorbidities bilateral mastecomy, expanders with fills    Examination-Activity Limitations Reach Overhead;Bend    Examination-Participation Restrictions Shop;Laundry;Meal Prep;Occupation    PT Frequency 2x / week    PT Duration 6 weeks    PT Treatment/Interventions ADLs/Self Care Home Management;Therapeutic  activities;Therapeutic exercise;Neuromuscular re-education;Patient/family education;Manual techniques;Scar mobilization;Passive range of motion;Dry needling;Taping;Spinal Manipulations;Moist Heat;Cryotherapy    PT Next Visit Plan manual to Rt breast and surrounding musculature thoracic mobility, sternal/clavicle mobility, posture, potential TPDN rhomboids/multifidi, diaphragmatic breathing, shoulder/chest strengthening.    PT Home Exercise Plan Access Code: [email or web address]    Consulted and Agree with Plan of Care Patient             Patient will benefit from skilled therapeutic intervention in order to improve the following deficits and impairments:  Decreased range of motion, Increased fascial restricitons, Impaired UE functional use, Increased muscle spasms, Decreased skin integrity, Pain, Impaired flexibility, Decreased scar mobility, Postural dysfunction  Visit Diagnosis: Stiffness of right shoulder, not elsewhere classified  Breast pain, right  Abnormal increased muscle tightness  Abnormal posture     Problem List Patient Active Problem List   Diagnosis Date Noted   S/P breast reconstruction, bilateral 07/17/2020   Acquired absence of breast 03/22/2020   Breast cancer ([place]) 03/13/2020   Genetic testing 01/05/2020   Family history of breast cancer    Family history of leukemia    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 [place], [place], [address] Phone: [phone]   Fax:  [phone]  Name: Stacey Singh MRN: [id number] Date of Birth: [birth year]

## 2021-07-09 ENCOUNTER — Ambulatory Visit: Payer: BC Managed Care – PPO

## 2021-07-09 ENCOUNTER — Other Ambulatory Visit: Payer: Self-pay

## 2021-07-09 DIAGNOSIS — M25611 Stiffness of right shoulder, not elsewhere classified: Secondary | ICD-10-CM | POA: Diagnosis not present

## 2021-07-09 DIAGNOSIS — N644 Mastodynia: Secondary | ICD-10-CM

## 2021-07-09 DIAGNOSIS — R293 Abnormal posture: Secondary | ICD-10-CM

## 2021-07-09 DIAGNOSIS — M6289 Other specified disorders of muscle: Secondary | ICD-10-CM

## 2021-07-09 NOTE — Therapy (Signed)
[place], [place], [address] Phone: [phone]   Fax:  [phone]  Physical Therapy Treatment  Patient Details  Name: Stacey Singh MRN: [id number] Date of Birth: [birth year] Referring Provider (PT): Wallace Going, DO   Encounter Date: 07/09/2021   PT End of Session - 07/09/21 1615     Visit Number 9    Number of Visits 17    Date for PT Re-Evaluation 08/09/21    Authorization Type BCBS    PT Start Time 6789    PT Stop Time 1702    PT Time Calculation (min) 45 min    Activity Tolerance Patient tolerated treatment well    Behavior During Therapy [facility] for tasks assessed/performed             Past Medical History:  Diagnosis Date   Allergy    Cancer ([employer])    breast   Family history of breast cancer    Family history of leukemia    Heart murmur    in high school, resolved   History of COVID-19 08/05/2020   Hypertension     Past Surgical History:  Procedure Laterality Date   BREAST CYST EXCISION Bilateral 10/09/2020   Procedure: EXCISION OF EXCESS BREAST TISSUE;  Surgeon: Wallace Going, DO;  Location: [place];  Service: Plastics;  Laterality: Bilateral;   BREAST RECONSTRUCTION WITH PLACEMENT OF TISSUE EXPANDER AND FLEX HD (ACELLULAR HYDRATED DERMIS) Bilateral 03/13/2020   Procedure: BILATERAL BREAST RECONSTRUCTION WITH PLACEMENT OF TISSUE EXPANDERS AND FLEX HD (ACELLULAR HYDRATED DERMIS);  Surgeon: Wallace Going, DO;  Location: [place];  Service: Plastics;  Laterality: Bilateral;   CESAREAN SECTION  04/04/2012   Procedure: CESAREAN SECTION;  Surgeon: Farrel Gobble. Harrington Challenger, MD;  Location: [facility];  Service: Obstetrics;  Laterality: N/A;   CESAREAN SECTION N/A 05/19/2015   Procedure: CESAREAN SECTION;  Surgeon: Allyn Kenner, DO;  Location: [place] [facility];  Service: Obstetrics;  Laterality: N/A;   CESAREAN SECTION     DILATION AND CURETTAGE OF UTERUS      LIPOSUCTION WITH LIPOFILLING Bilateral 10/09/2020   Procedure: LIPOSUCTION WITH LIPOFILLING;  Surgeon: Wallace Going, DO;  Location: [place];  Service: Plastics;  Laterality: Bilateral;  3 hours total, please   MASTECTOMY W/ SENTINEL NODE BIOPSY Bilateral 03/13/2020   Procedure: BILATERAL MASTECTOMY WITH RIGHT SENTINEL LYMPH NODE BIOPSY;  Surgeon: Jovita Kussmaul, MD;  Location: [place];  Service: General;  Laterality: Bilateral;  PEC BLOCK   REMOVAL OF BILATERAL TISSUE EXPANDERS WITH PLACEMENT OF BILATERAL BREAST IMPLANTS Bilateral 06/13/2020   Procedure: REMOVAL OF BILATERAL TISSUE EXPANDERS WITH PLACEMENT OF BILATERAL BREAST IMPLANTS;  Surgeon: Wallace Going, DO;  Location: [place];  Service: Plastics;  Laterality: Bilateral;   WISDOM TOOTH EXTRACTION      There were no vitals filed for this visit.   Subjective Assessment - 07/09/21 1618     Subjective Patient reports the neck was sore after last session, but overall she is feeling about the same, not worse.    Currently in Pain? Yes    Pain Score 2     Pain Location Chest    Pain Orientation Right;Anterior    Pain Descriptors / Indicators Tightness;Heaviness    Pain Type Chronic pain    Pain Onset More than a month ago    Pain Frequency Constant  [place] Adult PT Treatment/Exercise:   Therapeutic Exercise: - pec stretch on roller 2 min - ball taps at wall half circle RUE 2 x 5  - serratus wall slides 2 x 10       Manual Therapy: - IASTM to Rt pectorals and latissimus dorsi.  - CPAs T3-T10 grade III-V  - Rt rib springing ribs 3-5,  sternocostal joint 3-5 A/P joint mobilizations grade II-III - Rt clavicular mobilization grade II-III superior, inferior, A/P                                 PT Short Term Goals - 06/23/21 2706       PT SHORT TERM GOAL #1   Title Patient will be independent with initial HEP.     Baseline issued at eval.    Status Achieved    Target Date 05/21/21               PT Long Term Goals - 06/23/21 0815       PT LONG TERM GOAL #1   Title Patient will report tightness/pinching at worst rated as 5/10 with bending to improve her tolerance to tying her shoes    Baseline 9/10 with bending    Status On-going      PT LONG TERM GOAL #2   Title Patient will score at least 75% function on FOTO to signify clinically meaningful improvement in functional abilities.    Baseline 64%; 68% 11/28    Status On-going      PT LONG TERM GOAL #3   Title Patient will demonstrate at least 170 degrees of Rt shoulder abduction AROM without reports of increased breast tightness to improve ability to complete overhead activities.    Baseline see flowsheet    Status On-going      PT LONG TERM GOAL #4   Title Patient will be independent with advanced home program to maintain/progress current level of function.    Baseline progressing as appropriate    Status On-going      PT LONG TERM GOAL #5   Title Patient will demonstrate pain free Rt shoulder MMT to improve ability to complete recreational activity.    Baseline pain and tightness with MMT of shoulder IR, shoulder horizontal adduction, and latissismus dorsi    Status New    Target Date 08/04/21                   Plan - 07/09/21 1645     Clinical Impression Statement Patient noted to have pain and hypomobility at 3-5 sternocostal joint on the Rt with minimal improvement following joint mobilization. Able to progress shoulder strengthening with patient tolerating all ther ex well without reports of increased pain. She reports fatigue in the shoulder with overhead strengthening, though is able to maintain proper form.    Personal Factors and Comorbidities Time since onset of injury/illness/exacerbation;Profession;Comorbidity 2    Comorbidities bilateral mastecomy, expanders with fills    Examination-Activity Limitations Reach  Overhead;Bend    Examination-Participation Restrictions Shop;Laundry;Meal Prep;Occupation    PT Frequency 2x / week    PT Duration 6 weeks    PT Treatment/Interventions ADLs/Self Care Home Management;Therapeutic activities;Therapeutic exercise;Neuromuscular re-education;Patient/family education;Manual techniques;Scar mobilization;Passive range of motion;Dry needling;Taping;Spinal Manipulations;Moist Heat;Cryotherapy    PT Next Visit Plan manual to Rt breast and surrounding musculature thoracic mobility, sternal/clavicle mobility, posture, potential TPDN rhomboids/multifidi, diaphragmatic breathing, shoulder/chest strengthening.    PT Home Exercise  Plan Access Code: [email or web address]    Consulted and Agree with Plan of Care Patient             Patient will benefit from skilled therapeutic intervention in order to improve the following deficits and impairments:  Decreased range of motion, Increased fascial restricitons, Impaired UE functional use, Increased muscle spasms, Decreased skin integrity, Pain, Impaired flexibility, Decreased scar mobility, Postural dysfunction  Visit Diagnosis: Stiffness of right shoulder, not elsewhere classified  Breast pain, right  Abnormal increased muscle tightness  Abnormal posture     Problem List Patient Active Problem List   Diagnosis Date Noted   S/P breast reconstruction, bilateral 07/17/2020   Acquired absence of breast 03/22/2020   Breast cancer ([place]) 03/13/2020   Genetic testing 01/05/2020   Family history of breast cancer    Family history of leukemia    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

## 2021-07-14 ENCOUNTER — Ambulatory Visit: Payer: BC Managed Care – PPO

## 2021-07-14 ENCOUNTER — Other Ambulatory Visit: Payer: Self-pay

## 2021-07-14 DIAGNOSIS — N644 Mastodynia: Secondary | ICD-10-CM

## 2021-07-14 DIAGNOSIS — M6289 Other specified disorders of muscle: Secondary | ICD-10-CM

## 2021-07-14 DIAGNOSIS — M25611 Stiffness of right shoulder, not elsewhere classified: Secondary | ICD-10-CM

## 2021-07-14 DIAGNOSIS — R293 Abnormal posture: Secondary | ICD-10-CM

## 2021-07-14 NOTE — Therapy (Signed)
[place], [place], [address] Phone: [phone]   Fax:  [phone]  Physical Therapy Treatment  Patient Details  Name: Stacey Singh MRN: [id number] Date of Birth: 1976/05/15 Referring Provider (PT): Wallace Going, DO   Encounter Date: 07/14/2021   PT End of Session - 07/14/21 0722     Visit Number 10    Number of Visits 17    Date for PT Re-Evaluation 08/09/21    Authorization Type BCBS    PT Start Time [phone]   patient late   PT Stop Time 0801    PT Time Calculation (min) 38 min    Activity Tolerance Patient tolerated treatment well    Behavior During Therapy [facility] for tasks assessed/performed             Past Medical History:  Diagnosis Date   Allergy    Cancer ([place])    breast   Family history of breast cancer    Family history of leukemia    Heart murmur    in high school, resolved   History of COVID-19 08/05/2020   Hypertension     Past Surgical History:  Procedure Laterality Date   BREAST CYST EXCISION Bilateral 10/09/2020   Procedure: EXCISION OF EXCESS BREAST TISSUE;  Surgeon: Wallace Going, DO;  Location: [place];  Service: Plastics;  Laterality: Bilateral;   BREAST RECONSTRUCTION WITH PLACEMENT OF TISSUE EXPANDER AND FLEX HD (ACELLULAR HYDRATED DERMIS) Bilateral 03/13/2020   Procedure: BILATERAL BREAST RECONSTRUCTION WITH PLACEMENT OF TISSUE EXPANDERS AND FLEX HD (ACELLULAR HYDRATED DERMIS);  Surgeon: Wallace Going, DO;  Location: [place];  Service: Plastics;  Laterality: Bilateral;   CESAREAN SECTION  04/04/2012   Procedure: CESAREAN SECTION;  Surgeon: Farrel Gobble. Harrington Challenger, MD;  Location: [facility];  Service: Obstetrics;  Laterality: N/A;   CESAREAN SECTION N/A 05/19/2015   Procedure: CESAREAN SECTION;  Surgeon: Allyn Kenner, DO;  Location: [facility];  Service: Obstetrics;  Laterality: N/A;   CESAREAN SECTION     DILATION AND  CURETTAGE OF UTERUS     LIPOSUCTION WITH LIPOFILLING Bilateral 10/09/2020   Procedure: LIPOSUCTION WITH LIPOFILLING;  Surgeon: Wallace Going, DO;  Location: [place];  Service: Plastics;  Laterality: Bilateral;  3 hours total, please   MASTECTOMY W/ SENTINEL NODE BIOPSY Bilateral 03/13/2020   Procedure: BILATERAL MASTECTOMY WITH RIGHT SENTINEL LYMPH NODE BIOPSY;  Surgeon: Jovita Kussmaul, MD;  Location: [place];  Service: General;  Laterality: Bilateral;  PEC BLOCK   REMOVAL OF BILATERAL TISSUE EXPANDERS WITH PLACEMENT OF BILATERAL BREAST IMPLANTS Bilateral 06/13/2020   Procedure: REMOVAL OF BILATERAL TISSUE EXPANDERS WITH PLACEMENT OF BILATERAL BREAST IMPLANTS;  Surgeon: Wallace Going, DO;  Location: [place];  Service: Plastics;  Laterality: Bilateral;   WISDOM TOOTH EXTRACTION      There were no vitals filed for this visit.   Subjective Assessment - 07/14/21 0723     Subjective "We had to rake leaves so a little on the sore side." She reports raking leaves on Saturday and Sunday and is sore along the lateral aspect of the Rt chest, not the anterior aspect.    Currently in Pain? Yes    Pain Score 2     Pain Location Chest    Pain Orientation Right;Lateral    Pain Descriptors / Indicators Sore;Tightness    Pain Type Chronic pain    Pain Onset More than a  month ago    Pain Frequency Constant    Aggravating Factors  "depends on the situation."    Pain Relieving Factors "hot shower, stretching"               [facility] Adult PT Treatment/Exercise:   Therapeutic Exercise: - UBE level 2, 2 min each fwd/bwd  - bilateral shoulder ER red band 2 x 15  - prone row 4# dumbbell 2 x 15 RUE  - updated HEP to include row and ER       Manual Therapy: - IASTM to Rt pectorals and latissimus dorsi.                               PT Short Term Goals - 06/23/21 4854       PT SHORT TERM GOAL #1   Title  Patient will be independent with initial HEP.    Baseline issued at eval.    Status Achieved    Target Date 05/21/21               PT Long Term Goals - 06/23/21 0815       PT LONG TERM GOAL #1   Title Patient will report tightness/pinching at worst rated as 5/10 with bending to improve her tolerance to tying her shoes    Baseline 9/10 with bending    Status On-going      PT LONG TERM GOAL #2   Title Patient will score at least 75% function on FOTO to signify clinically meaningful improvement in functional abilities.    Baseline 64%; 68% 11/28    Status On-going      PT LONG TERM GOAL #3   Title Patient will demonstrate at least 170 degrees of Rt shoulder abduction AROM without reports of increased breast tightness to improve ability to complete overhead activities.    Baseline see flowsheet    Status On-going      PT LONG TERM GOAL #4   Title Patient will be independent with advanced home program to maintain/progress current level of function.    Baseline progressing as appropriate    Status On-going      PT LONG TERM GOAL #5   Title Patient will demonstrate pain free Rt shoulder MMT to improve ability to complete recreational activity.    Baseline pain and tightness with MMT of shoulder IR, shoulder horizontal adduction, and latissismus dorsi    Status New    Target Date 08/04/21                   Plan - 07/14/21 0730     Clinical Impression Statement Patient tolerated session well today focusing on manual work to the Rt anterior chest musculature and progression of periscapular/rotator cuff strengthening. No reports of pain with ther ex, though reports fatigue fairly quickly with all ther ex. Minor postural cues required with strengthening to reduce upper trap compensation and decrease forward shoulders.    Personal Factors and Comorbidities Time since onset of injury/illness/exacerbation;Profession;Comorbidity 2    Comorbidities bilateral mastecomy,  expanders with fills    Examination-Activity Limitations --    Examination-Participation Restrictions --    PT Frequency --    PT Duration --    PT Treatment/Interventions ADLs/Self Care Home Management;Therapeutic activities;Therapeutic exercise;Neuromuscular re-education;Patient/family education;Manual techniques;Scar mobilization;Passive range of motion;Dry needling;Taping;Spinal Manipulations;Moist Heat;Cryotherapy    PT Next Visit Plan manual to Rt breast and surrounding musculature thoracic mobility, sternal/clavicle mobility, posture,  potential TPDN rhomboids/multifidi, diaphragmatic breathing, shoulder/chest strengthening.    PT Home Exercise Plan Access Code: [email or web address]    Consulted and Agree with Plan of Care Patient             Patient will benefit from skilled therapeutic intervention in order to improve the following deficits and impairments:  Decreased range of motion, Increased fascial restricitons, Impaired UE functional use, Increased muscle spasms, Decreased skin integrity, Pain, Impaired flexibility, Decreased scar mobility, Postural dysfunction  Visit Diagnosis: Stiffness of right shoulder, not elsewhere classified  Breast pain, right  Abnormal increased muscle tightness  Abnormal posture     Problem List Patient Active Problem List   Diagnosis Date Noted   S/P breast reconstruction, bilateral 07/17/2020   Acquired absence of breast 03/22/2020   Breast cancer ([place]) 03/13/2020   Genetic testing 01/05/2020   Family history of breast cancer    Family history of leukemia    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

## 2021-07-16 ENCOUNTER — Ambulatory Visit: Payer: BC Managed Care – PPO

## 2021-07-16 ENCOUNTER — Other Ambulatory Visit: Payer: Self-pay

## 2021-07-16 DIAGNOSIS — M25611 Stiffness of right shoulder, not elsewhere classified: Secondary | ICD-10-CM | POA: Diagnosis not present

## 2021-07-16 DIAGNOSIS — R293 Abnormal posture: Secondary | ICD-10-CM

## 2021-07-16 DIAGNOSIS — M6289 Other specified disorders of muscle: Secondary | ICD-10-CM

## 2021-07-16 DIAGNOSIS — N644 Mastodynia: Secondary | ICD-10-CM

## 2021-07-16 NOTE — Therapy (Signed)
[place], [place], [address] Phone: [phone]   Fax:  [phone]  Physical Therapy Treatment  Patient Details  Name: Stacey Singh MRN: [id number] Date of Birth: Sep 18, 1975 Referring Provider (PT): Wallace Going, DO   Encounter Date: 07/16/2021   PT End of Session - 07/16/21 1101     Visit Number 11    Number of Visits 17    Date for PT Re-Evaluation 08/09/21    Authorization Type BCBS    PT Start Time 1101   2 min TPDN   PT Stop Time 8676    PT Time Calculation (min) 42 min    Activity Tolerance Patient tolerated treatment well    Behavior During Therapy [facility] for tasks assessed/performed             Past Medical History:  Diagnosis Date   Allergy    Cancer ([place])    breast   Family history of breast cancer    Family history of leukemia    Heart murmur    in high school, resolved   History of COVID-19 08/05/2020   Hypertension     Past Surgical History:  Procedure Laterality Date   BREAST CYST EXCISION Bilateral 10/09/2020   Procedure: EXCISION OF EXCESS BREAST TISSUE;  Surgeon: Wallace Going, DO;  Location: [place];  Service: Plastics;  Laterality: Bilateral;   BREAST RECONSTRUCTION WITH PLACEMENT OF TISSUE EXPANDER AND FLEX HD (ACELLULAR HYDRATED DERMIS) Bilateral 03/13/2020   Procedure: BILATERAL BREAST RECONSTRUCTION WITH PLACEMENT OF TISSUE EXPANDERS AND FLEX HD (ACELLULAR HYDRATED DERMIS);  Surgeon: Wallace Going, DO;  Location: [place];  Service: Plastics;  Laterality: Bilateral;   CESAREAN SECTION  04/04/2012   Procedure: CESAREAN SECTION;  Surgeon: Farrel Gobble. Harrington Challenger, MD;  Location: [facility];  Service: Obstetrics;  Laterality: N/A;   CESAREAN SECTION N/A 05/19/2015   Procedure: CESAREAN SECTION;  Surgeon: Allyn Kenner, DO;  Location: [facility];  Service: Obstetrics;  Laterality: N/A;   CESAREAN SECTION     DILATION AND CURETTAGE  OF UTERUS     LIPOSUCTION WITH LIPOFILLING Bilateral 10/09/2020   Procedure: LIPOSUCTION WITH LIPOFILLING;  Surgeon: Wallace Going, DO;  Location: [place];  Service: Plastics;  Laterality: Bilateral;  3 hours total, please   MASTECTOMY W/ SENTINEL NODE BIOPSY Bilateral 03/13/2020   Procedure: BILATERAL MASTECTOMY WITH RIGHT SENTINEL LYMPH NODE BIOPSY;  Surgeon: Jovita Kussmaul, MD;  Location: [place];  Service: General;  Laterality: Bilateral;  PEC BLOCK   REMOVAL OF BILATERAL TISSUE EXPANDERS WITH PLACEMENT OF BILATERAL BREAST IMPLANTS Bilateral 06/13/2020   Procedure: REMOVAL OF BILATERAL TISSUE EXPANDERS WITH PLACEMENT OF BILATERAL BREAST IMPLANTS;  Surgeon: Wallace Going, DO;  Location: [place];  Service: Plastics;  Laterality: Bilateral;   WISDOM TOOTH EXTRACTION      There were no vitals filed for this visit.   Subjective Assessment - 07/16/21 1103     Subjective Patient reports she is feeling a little bit better than last session. She has done more of the stretches vs. strengthening since last session as part of her HEP.    Currently in Pain? No/denies                [facility] PT Assessment - 07/16/21 0001       AROM   Right Shoulder Internal Rotation 78 Degrees  TREATMENT 07/16/2021:  Therapeutic Exercise: - 90/90 resisted IR 2 x 10 red band RUE  - body blade 3 x 20 sec elbow at 90 shoulder at neutral RUE  - body blade D2 pattern 2 x 5 RUE  Manual Therapy: - IASTM to anterior chest musculature Rt - STM to Rt upper trap/levator scapulae   Trigger Point Dry Needling Treatment: Pre-treatment instruction: Patient instructed on dry needling rationale, procedures, and possible side effects including pain during treatment (achy,cramping feeling), bruising, drop of blood, lightheadedness, nausea, sweating. Patient Consent Given: Yes Education handout provided: Previously  provided Muscles treated: Rt upper trap   Treatment response/outcome: Twitch response elicited and Palpable decrease in muscle tension Post-treatment instructions: Patient instructed to expect possible mild to moderate muscle soreness later today and/or tomorrow. Patient instructed in methods to reduce muscle soreness and to continue prescribed HEP. If patient was dry needled over the lung field, patient was instructed on signs and symptoms of pneumothorax and, however unlikely, to see immediate medical attention should they occur. Patient was also educated on signs and symptoms of infection and to seek medical attention should they occur. Patient verbalized understanding of these instructions and education.                      PT Short Term Goals - 06/23/21 8032       PT SHORT TERM GOAL #1   Title Patient will be independent with initial HEP.    Baseline issued at eval.    Status Achieved    Target Date 05/21/21               PT Long Term Goals - 06/23/21 0815       PT LONG TERM GOAL #1   Title Patient will report tightness/pinching at worst rated as 5/10 with bending to improve her tolerance to tying her shoes    Baseline 9/10 with bending    Status On-going      PT LONG TERM GOAL #2   Title Patient will score at least 75% function on FOTO to signify clinically meaningful improvement in functional abilities.    Baseline 64%; 68% 11/28    Status On-going      PT LONG TERM GOAL #3   Title Patient will demonstrate at least 170 degrees of Rt shoulder abduction AROM without reports of increased breast tightness to improve ability to complete overhead activities.    Baseline see flowsheet    Status On-going      PT LONG TERM GOAL #4   Title Patient will be independent with advanced home program to maintain/progress current level of function.    Baseline progressing as appropriate    Status On-going      PT LONG TERM GOAL #5   Title Patient will demonstrate  pain free Rt shoulder MMT to improve ability to complete recreational activity.    Baseline pain and tightness with MMT of shoulder IR, shoulder horizontal adduction, and latissismus dorsi    Status New    Target Date 08/04/21                   Plan - 07/16/21 1131     Clinical Impression Statement TPDN performed to Rt upper trap with excellent twitch response elicited and a reduction in palpable tenderness following this intervention. Her shoulder IR AROM has significantly improved compared to last assessment nearing full range. Continued to progress Rt shoulder and periscapular strength with patient having improved postural awareness  compared to previous sessions.    Personal Factors and Comorbidities Time since onset of injury/illness/exacerbation;Profession;Comorbidity 2    Comorbidities bilateral mastecomy, expanders with fills    PT Treatment/Interventions ADLs/Self Care Home Management;Therapeutic activities;Therapeutic exercise;Neuromuscular re-education;Patient/family education;Manual techniques;Scar mobilization;Passive range of motion;Dry needling;Taping;Spinal Manipulations;Moist Heat;Cryotherapy    PT Next Visit Plan manual to Rt breast and surrounding musculature thoracic mobility, sternal/clavicle mobility, posture, potential TPDN rhomboids/multifidi, diaphragmatic breathing, shoulder/chest strengthening.    PT Home Exercise Plan Access Code: [email or web address]    Consulted and Agree with Plan of Care Patient             Patient will benefit from skilled therapeutic intervention in order to improve the following deficits and impairments:  Decreased range of motion, Increased fascial restricitons, Impaired UE functional use, Increased muscle spasms, Decreased skin integrity, Pain, Impaired flexibility, Decreased scar mobility, Postural dysfunction  Visit Diagnosis: Stiffness of right shoulder, not elsewhere classified  Breast pain, right  Abnormal increased muscle  tightness  Abnormal posture     Problem List Patient Active Problem List   Diagnosis Date Noted   S/P breast reconstruction, bilateral 07/17/2020   Acquired absence of breast 03/22/2020   Breast cancer ([place]) 03/13/2020   Genetic testing 01/05/2020   Family history of breast cancer    Family history of leukemia    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

## 2021-07-22 ENCOUNTER — Encounter: Payer: Self-pay | Admitting: Physical Therapy

## 2021-07-22 ENCOUNTER — Ambulatory Visit: Payer: BC Managed Care – PPO | Admitting: Physical Therapy

## 2021-07-22 ENCOUNTER — Other Ambulatory Visit: Payer: Self-pay

## 2021-07-22 DIAGNOSIS — R293 Abnormal posture: Secondary | ICD-10-CM

## 2021-07-22 DIAGNOSIS — M6289 Other specified disorders of muscle: Secondary | ICD-10-CM

## 2021-07-22 DIAGNOSIS — M25612 Stiffness of left shoulder, not elsewhere classified: Secondary | ICD-10-CM

## 2021-07-22 DIAGNOSIS — Z483 Aftercare following surgery for neoplasm: Secondary | ICD-10-CM

## 2021-07-22 DIAGNOSIS — M25611 Stiffness of right shoulder, not elsewhere classified: Secondary | ICD-10-CM

## 2021-07-22 DIAGNOSIS — N644 Mastodynia: Secondary | ICD-10-CM

## 2021-07-22 DIAGNOSIS — M6281 Muscle weakness (generalized): Secondary | ICD-10-CM

## 2021-07-22 NOTE — Therapy (Signed)
[place], [place], [address] Phone: [phone]   Fax:  [phone]  Physical Therapy Treatment  Patient Details  Name: Stacey Singh MRN: [id number] Date of Birth: March 15, 1976 Referring Provider (PT): Wallace Going, DO   Encounter Date: 07/22/2021   PT End of Session - 07/22/21 0810     Visit Number 12    Number of Visits 17    Date for PT Re-Evaluation 08/09/21    Authorization Type BCBS    PT Start Time 0801    PT Stop Time 0900    PT Time Calculation (min) 59 min    Activity Tolerance Patient tolerated treatment well    Behavior During Therapy [facility] for tasks assessed/performed              Past Medical History:  Diagnosis Date   Allergy    Cancer ([place])    breast   Family history of breast cancer    Family history of leukemia    Heart murmur    in high school, resolved   History of COVID-19 08/05/2020   Hypertension     Past Surgical History:  Procedure Laterality Date   BREAST CYST EXCISION Bilateral 10/09/2020   Procedure: EXCISION OF EXCESS BREAST TISSUE;  Surgeon: Wallace Going, DO;  Location: [facility];  Service: Plastics;  Laterality: Bilateral;   BREAST RECONSTRUCTION WITH PLACEMENT OF TISSUE EXPANDER AND FLEX HD (ACELLULAR HYDRATED DERMIS) Bilateral 03/13/2020   Procedure: BILATERAL BREAST RECONSTRUCTION WITH PLACEMENT OF TISSUE EXPANDERS AND FLEX HD (ACELLULAR HYDRATED DERMIS);  Surgeon: Wallace Going, DO;  Location: [place];  Service: Plastics;  Laterality: Bilateral;   CESAREAN SECTION  04/04/2012   Procedure: CESAREAN SECTION;  Surgeon: Farrel Gobble. Harrington Challenger, MD;  Location: [facility];  Service: Obstetrics;  Laterality: N/A;   CESAREAN SECTION N/A 05/19/2015   Procedure: CESAREAN SECTION;  Surgeon: Allyn Kenner, DO;  Location: [facility];  Service: Obstetrics;  Laterality: N/A;   CESAREAN SECTION     DILATION AND CURETTAGE OF UTERUS      LIPOSUCTION WITH LIPOFILLING Bilateral 10/09/2020   Procedure: LIPOSUCTION WITH LIPOFILLING;  Surgeon: Wallace Going, DO;  Location: [facility];  Service: Plastics;  Laterality: Bilateral;  3 hours total, please   MASTECTOMY W/ SENTINEL NODE BIOPSY Bilateral 03/13/2020   Procedure: BILATERAL MASTECTOMY WITH RIGHT SENTINEL LYMPH NODE BIOPSY;  Surgeon: Jovita Kussmaul, MD;  Location: [facility];  Service: General;  Laterality: Bilateral;  PEC BLOCK   REMOVAL OF BILATERAL TISSUE EXPANDERS WITH PLACEMENT OF BILATERAL BREAST IMPLANTS Bilateral 06/13/2020   Procedure: REMOVAL OF BILATERAL TISSUE EXPANDERS WITH PLACEMENT OF BILATERAL BREAST IMPLANTS;  Surgeon: Wallace Going, DO;  Location: [facility];  Service: Plastics;  Laterality: Bilateral;   WISDOM TOOTH EXTRACTION      There were no vitals filed for this visit.   Subjective Assessment - 07/22/21 0806     Subjective I had a lot of pain in my lateral side of my R breast..  Everything we did was fine.  I was grating a lot of potatoe latke's but I really dont know if there is anything that would cause me to have this pain.  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    Pertinent History history of breast cancer    Patient Stated Goals "Less tightness, and less discomfort with bending."  Currently in Pain? No/denies    Pain Score 5     Pain Location Breast    Pain Orientation Lateral;Anterior   lateral breast to ant chext   Pain Descriptors / Indicators Sore;Tightness   pinching   Pain Type Chronic pain    Pain Onset More than a month ago    Pain Frequency Constant                        TREATMENT 07/22/2021:  Therapeutic Exercise: - prone W with thoracic extension and then W lift with chin tuck VC and TC x 10 - 90/90 resisted IR 2 x 10 green band RUE  - bil shld scaption with GTB 2 x 10    Manual Therapy: - IASTM to anterior  chest musculature  and ant lateral chest wall between intercostals.  Also tack and stretch while pt trunk rotates knees to left and upper trunk to R and the opposite/vice versa while PT holds onto Ant later trunk where pt c/o of pain - STM to Rt upper trap/levator scapulae   Trigger Point Dry Needling Treatment: Pre-treatment instruction: Patient instructed on dry needling rationale, procedures, and possible side effects including pain during treatment (achy,cramping feeling), bruising, drop of blood, lightheadedness, nausea, sweating. Patient Consent Given: Yes Education handout provided: Previously provided Muscles treated: Rt upper trap . R subscapula. R pec Treatment response/outcome: Twitch response elicited and Palpable decrease in muscle tension Post-treatment instructions: Patient instructed to expect possible mild to moderate muscle soreness later today and/or tomorrow. Patient instructed in methods to reduce muscle soreness and to continue prescribed HEP. If patient was dry needled over the lung field, patient was instructed on signs and symptoms of pneumothorax and, however unlikely, to see immediate medical attention should they occur. Patient was also educated on signs and symptoms of infection and to seek medical attention should they occur. Patient verbalized understanding of these instructions and education.    There ex not done today: - body blade 3 x 20 sec elbow at 90 shoulder at neutral RUE  - body blade D2 pattern 2 x 5 RUE   Access Code: [email or web address] URL: [email or web address] Date: 07/22/2021 Prepared by: Voncille Lo  Exercises  Prone Shoulder W on Swiss Ball - 1 x daily - 7 x weekly - 2 sets - 10 reps Scaption with Resistance - 1 x daily - 7 x weekly - 3 sets - 10 reps              PT Education - 07/22/21 1002     Education Details added to HEP for strength with green t band    Person(s) Educated Patient    Methods  Explanation;Demonstration;Tactile cues;Verbal cues;Handout   handout on app   Comprehension Verbalized understanding;Returned demonstration              PT Short Term Goals - 06/23/21 9326       PT SHORT TERM GOAL #1   Title Patient will be independent with initial HEP.    Baseline issued at eval.    Status Achieved    Target Date 05/21/21               PT Long Term Goals - 06/23/21 0815       PT LONG TERM GOAL #1   Title Patient will report tightness/pinching at worst rated as 5/10 with bending to improve her tolerance to tying her shoes    Baseline 9/10 with bending  Status On-going      PT LONG TERM GOAL #2   Title Patient will score at least 75% function on FOTO to signify clinically meaningful improvement in functional abilities.    Baseline 64%; 68% 11/28    Status On-going      PT LONG TERM GOAL #3   Title Patient will demonstrate at least 170 degrees of Rt shoulder abduction AROM without reports of increased breast tightness to improve ability to complete overhead activities.    Baseline see flowsheet    Status On-going      PT LONG TERM GOAL #4   Title Patient will be independent with advanced home program to maintain/progress current level of function.    Baseline progressing as appropriate    Status On-going      PT LONG TERM GOAL #5   Title Patient will demonstrate pain free Rt shoulder MMT to improve ability to complete recreational activity.    Baseline pain and tightness with MMT of shoulder IR, shoulder horizontal adduction, and latissismus dorsi    Status New    Target Date 08/04/21                   Plan - 07/22/21 0811     Clinical Impression Statement TPDN performed to R upper trap, R subscap, R teres R pec  and ant lower ribs below breast on R with rib block technique.  Pt with reduced palpable tenderness following TPDN and use of dynamic trunk rotation with PT holding ant lateral trunk/ tac and stretch technique.  Pt with  normal IR post intervention and pt reports ability to be able to move R arm with decreased pain  Pt had reported TOS in college and added to HEP for strengthening with  green t band for bil shld scaption.  and prone thoracic upper back extension and W.  Pt will benefit form continued strengtheing of sub scapular stabilizers to decrease rounded shld and rounded upper back    Personal Factors and Comorbidities Time since onset of injury/illness/exacerbation;Profession;Comorbidity 2    Comorbidities bilateral mastecomy, expanders with fills    Examination-Activity Limitations Reach Overhead;Bend    Examination-Participation Restrictions Shop;Laundry;Meal Prep;Occupation    PT Frequency 2x / week    PT Duration 6 weeks    PT Treatment/Interventions ADLs/Self Care Home Management;Therapeutic activities;Therapeutic exercise;Neuromuscular re-education;Patient/family education;Manual techniques;Scar mobilization;Passive range of motion;Dry needling;Taping;Spinal Manipulations;Moist Heat;Cryotherapy    PT Next Visit Plan manual to Rt breast and surrounding musculature thoracic mobility, sternal/clavicle mobility, posture, potential TPDN rhomboids/multifidi, diaphragmatic breathing, shoulder/chest strengthening.    PT Home Exercise Plan Access Code: [email or web address]    Consulted and Agree with Plan of Care Patient              Patient will benefit from skilled therapeutic intervention in order to improve the following deficits and impairments:  Decreased range of motion, Increased fascial restricitons, Impaired UE functional use, Increased muscle spasms, Decreased skin integrity, Pain, Impaired flexibility, Decreased scar mobility, Postural dysfunction  Visit Diagnosis: Stiffness of right shoulder, not elsewhere classified  Breast pain, right  Abnormal increased muscle tightness  Abnormal posture  Aftercare following surgery for neoplasm  Stiffness of left shoulder, not elsewhere classified  Muscle  weakness (generalized)     Problem List Patient Active Problem List   Diagnosis Date Noted   S/P breast reconstruction, bilateral 07/17/2020   Acquired absence of breast 03/22/2020   Breast cancer ([place]) 03/13/2020   Genetic testing 01/05/2020   Family history of breast  cancer    Family history of leukemia    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

## 2021-07-29 ENCOUNTER — Encounter: Payer: Self-pay | Admitting: Physical Therapy

## 2021-07-29 ENCOUNTER — Other Ambulatory Visit: Payer: Self-pay

## 2021-07-29 ENCOUNTER — Ambulatory Visit: Payer: BC Managed Care – PPO | Attending: Plastic Surgery | Admitting: Physical Therapy

## 2021-07-29 DIAGNOSIS — Z0289 Encounter for other administrative examinations: Secondary | ICD-10-CM

## 2021-07-29 DIAGNOSIS — N644 Mastodynia: Secondary | ICD-10-CM | POA: Insufficient documentation

## 2021-07-29 DIAGNOSIS — M25611 Stiffness of right shoulder, not elsewhere classified: Secondary | ICD-10-CM | POA: Diagnosis present

## 2021-07-29 DIAGNOSIS — R293 Abnormal posture: Secondary | ICD-10-CM | POA: Diagnosis present

## 2021-07-29 DIAGNOSIS — M6289 Other specified disorders of muscle: Secondary | ICD-10-CM | POA: Insufficient documentation

## 2021-07-29 NOTE — Therapy (Signed)
[place], [place], [address] Phone: [phone]   Fax:  [phone]  Physical Therapy Treatment  Patient Details  Name: LARONDA LISBY MRN: [id number] Date of Birth: 1976/06/20 Referring Provider (PT): Wallace Going, DO   Encounter Date: 07/29/2021   PT End of Session - 07/29/21 0836     Visit Number 13    Number of Visits 17    Date for PT Re-Evaluation 08/09/21    Authorization Type BCBS    PT Start Time 0800    PT Stop Time 0846    PT Time Calculation (min) 46 min    Activity Tolerance Patient tolerated treatment well    Behavior During Therapy [facility] for tasks assessed/performed             Past Medical History:  Diagnosis Date   Allergy    Cancer ([place])    breast   Family history of breast cancer    Family history of leukemia    Heart murmur    in high school, resolved   History of COVID-19 08/05/2020   Hypertension     Past Surgical History:  Procedure Laterality Date   BREAST CYST EXCISION Bilateral 10/09/2020   Procedure: EXCISION OF EXCESS BREAST TISSUE;  Surgeon: Wallace Going, DO;  Location: [facility];  Service: Plastics;  Laterality: Bilateral;   BREAST RECONSTRUCTION WITH PLACEMENT OF TISSUE EXPANDER AND FLEX HD (ACELLULAR HYDRATED DERMIS) Bilateral 03/13/2020   Procedure: BILATERAL BREAST RECONSTRUCTION WITH PLACEMENT OF TISSUE EXPANDERS AND FLEX HD (ACELLULAR HYDRATED DERMIS);  Surgeon: Wallace Going, DO;  Location: [facility];  Service: Plastics;  Laterality: Bilateral;   CESAREAN SECTION  04/04/2012   Procedure: CESAREAN SECTION;  Surgeon: Farrel Gobble. Harrington Challenger, MD;  Location: [facility];  Service: Obstetrics;  Laterality: N/A;   CESAREAN SECTION N/A 05/19/2015   Procedure: CESAREAN SECTION;  Surgeon: Allyn Kenner, DO;  Location: [facility];  Service: Obstetrics;  Laterality: N/A;   CESAREAN SECTION     DILATION AND CURETTAGE OF UTERUS      LIPOSUCTION WITH LIPOFILLING Bilateral 10/09/2020   Procedure: LIPOSUCTION WITH LIPOFILLING;  Surgeon: Wallace Going, DO;  Location: [facility];  Service: Plastics;  Laterality: Bilateral;  3 hours total, please   MASTECTOMY W/ SENTINEL NODE BIOPSY Bilateral 03/13/2020   Procedure: BILATERAL MASTECTOMY WITH RIGHT SENTINEL LYMPH NODE BIOPSY;  Surgeon: Jovita Kussmaul, MD;  Location: [facility];  Service: General;  Laterality: Bilateral;  PEC BLOCK   REMOVAL OF BILATERAL TISSUE EXPANDERS WITH PLACEMENT OF BILATERAL BREAST IMPLANTS Bilateral 06/13/2020   Procedure: REMOVAL OF BILATERAL TISSUE EXPANDERS WITH PLACEMENT OF BILATERAL BREAST IMPLANTS;  Surgeon: Wallace Going, DO;  Location: [facility];  Service: Plastics;  Laterality: Bilateral;   WISDOM TOOTH EXTRACTION      There were no vitals filed for this visit.   Subjective Assessment - 07/29/21 0805     Subjective Yesterday was the first day that I wore a compressive bra because it was too painful. I have a pain on my lateral right breast from pectoral muscle to below breat. I reached up for something above my breast. and I felt a tiny pop/movement flip of my breast. I did not want my kids to hug me because of discomfort.  I have trouble running because of my lateral breast pain.    Pertinent History history of breast cancer    Pain Score 4  Pain Location Breast    Pain Orientation Anterior;Lateral    Pain Descriptors / Indicators Sore;Tightness;Heaviness    Pain Type Chronic pain    Pain Onset More than a month ago    Pain Frequency Constant           TREATMENT 07/29/21   Therapeutic Exercise: -  L sidelying with 5 # flexion - prone W with thoracic extension and then W lift with chin tuck VC and TC x 10 - 90/90 resisted IR 2 x 10 green band RUE  - bil shld scaption with BTB 2 x 10 - wall aROM Clock.  R hip to wall and R UE palm side out with arc of motion overhead and then  palm down behind following wall with added neck rotation to left to increase stretch and AROM         Manual Therapy: - IAlso tack and stretch while pt trunk rotates knees to left and upper trunk to R and the opposite/vice versa while PT holds onto Ant later trunk where pt c/o of pain - D1 and D2 on R in left sidelying.   - STM to R serratus, R tricep and R teres   Trigger Point Dry Needling Treatment: Pre-treatment instruction: Patient instructed on dry needling rationale, procedures, and possible side effects including pain during treatment (achy,cramping feeling), bruising, drop of blood, lightheadedness, nausea, sweating. Patient Consent Given: Yes Education handout provided: Previously provided Muscles treated: Rt upper trap . R Teres R serratus, R triceps Treatment response/outcome: Twitch response elicited and Palpable decrease in muscle tension Post-treatment instructions: Patient instructed to expect possible mild to moderate muscle soreness later today and/or tomorrow. Patient instructed in methods to reduce muscle soreness and to continue prescribed HEP. If patient was dry needled over the lung field, patient was instructed on signs and symptoms of pneumothorax and, however unlikely, to see immediate medical attention should they occur. Patient was also educated on signs and symptoms of infection and to seek medical attention should they occur. Patient verbalized understanding of these instructions and education.       There ex not done today: - body blade 3 x 20 sec elbow at 90 shoulder at neutral RUE  - body blade D2 pattern 2 x 5 RUE     [facility] PT Assessment - 07/29/21 0001       Assessment   Medical Diagnosis Z98.890 (ICD-10-CM) - S/P breast reconstruction, bilateral    Referring Provider (PT) Dillingham, Loel Lofty, DO    Onset Date/Surgical Date 03/13/20    Hand Dominance Right    Next MD Visit nothing sheduled      AROM   Right Shoulder Flexion 180 Degrees    Right  Shoulder ABduction 171 Degrees    Right Shoulder Internal Rotation 80 Degrees    Right Shoulder External Rotation 90 Degrees                                    PT Education - 07/29/21 1200     Education Details Pt given exercise and stretch using blue t band and wall clock with demo, also side lying using of 5 lb wt with shld flexi/ abld, physio ball    Person(s) Educated Patient    Methods Explanation;Demonstration;Tactile cues;Verbal cues    Comprehension Verbalized understanding;Returned demonstration              PT Short Term Goals -  06/23/21 0928       PT SHORT TERM GOAL #1   Title Patient will be independent with initial HEP.    Baseline issued at eval.    Status Achieved    Target Date 05/21/21               PT Long Term Goals - 07/29/21 0809       PT LONG TERM GOAL #1   Title Patient will report tightness/pinching at worst rated as 5/10 with bending to improve her tolerance to tying her shoes    Baseline Pt 4/10 entering clinic  3/10 with simulation of tying shoes reports 6/10 at worst    Time 8    Period Weeks    Status On-going    Target Date 08/09/21      PT LONG TERM GOAL #2   Title Patient will score at least 75% function on FOTO to signify clinically meaningful improvement in functional abilities.    Baseline 64%; 68% 11/28    Time 8    Period Weeks    Status On-going    Target Date 08/09/21      PT LONG TERM GOAL #3   Title Patient will demonstrate at least 170 degrees of Rt shoulder abduction AROM without reports of increased breast tightness to improve ability to complete overhead activities.    Baseline see flowsheet    Time 6    Period Weeks    Status Achieved    Target Date 08/09/21      PT LONG TERM GOAL #4   Title Patient will be independent with advanced home program to maintain/progress current level of function.    Baseline progressing as appropriate    Time 6    Period Weeks    Status On-going     Target Date 08/09/21      PT LONG TERM GOAL #5   Title Patient will demonstrate pain free Rt shoulder MMT to improve ability to complete recreational activity.    Baseline utilizing 5 lb wt in flexion and abd    Time 8    Target Date 08/09/21                   Plan - 07/29/21 1206     Clinical Impression Statement TPDN performed to R tricep, teres, serratus followed by stretch and myofascial release.  Pt with palpable release and dynamic trunk rotation with PT holding ant lateral trunk/ tack and stretch technique.  Pt given wall stretch with neck rotation to add dynamic movement at end range.  Pt FOTO 63% from 68% not representative of true increased movement and added functional movement.  Pt is able to reach for shoes post TPDN and RX today.  Ms Hossain does report she wakes up with tightness and pain.  Pt will benefit from ongoing stretching/exercise routine to reinforce with subsequent visits.  LTG #3 achieved.  Pt with WNL AROM of R shld but still have ongong ant/ lateral R breast pain.  Pt is limited and cannot run due to pain and has pain while wearing bra that will benefit form desensitization by normalizing longer wear times Will cont strengthening and maximize until DC    Personal Factors and Comorbidities Time since onset of injury/illness/exacerbation;Profession;Comorbidity 2    Comorbidities bilateral mastecomy, expanders with fills    Examination-Activity Limitations Reach Overhead;Bend    Examination-Participation Restrictions Shop;Laundry;Meal Prep;Occupation    PT Frequency 2x / week    PT Duration 6 weeks  PT Treatment/Interventions ADLs/Self Care Home Management;Therapeutic activities;Therapeutic exercise;Neuromuscular re-education;Patient/family education;Manual techniques;Scar mobilization;Passive range of motion;Dry needling;Taping;Spinal Manipulations;Moist Heat;Cryotherapy    PT Next Visit Plan manual to Rt breast and surrounding musculature thoracic mobility,  sternal/clavicle mobility, posture, potential TPDN rhomboids/multifidi, diaphragmatic breathing, shoulder/chest strengthening.    PT Home Exercise Plan Access Code: [email or web address]    Consulted and Agree with Plan of Care Patient             Patient will benefit from skilled therapeutic intervention in order to improve the following deficits and impairments:  Decreased range of motion, Increased fascial restricitons, Impaired UE functional use, Increased muscle spasms, Decreased skin integrity, Pain, Impaired flexibility, Decreased scar mobility, Postural dysfunction  Visit Diagnosis: Stiffness of right shoulder, not elsewhere classified  Breast pain, right  Abnormal increased muscle tightness  Abnormal posture     Problem List Patient Active Problem List   Diagnosis Date Noted   S/P breast reconstruction, bilateral 07/17/2020   Acquired absence of breast 03/22/2020   Breast cancer ([place]) 03/13/2020   Genetic testing 01/05/2020   Family history of breast cancer    Family history of leukemia    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 [place], [place], [address] Phone: [phone]   Fax:  [phone]  Name: MAKEYLA GOVAN MRN: [id number] Date of Birth: Jun 19, 1976

## 2021-07-31 ENCOUNTER — Other Ambulatory Visit: Payer: Self-pay

## 2021-07-31 ENCOUNTER — Ambulatory Visit: Payer: BC Managed Care – PPO

## 2021-07-31 DIAGNOSIS — M25611 Stiffness of right shoulder, not elsewhere classified: Secondary | ICD-10-CM

## 2021-07-31 DIAGNOSIS — R293 Abnormal posture: Secondary | ICD-10-CM

## 2021-07-31 DIAGNOSIS — N644 Mastodynia: Secondary | ICD-10-CM

## 2021-07-31 DIAGNOSIS — M6289 Other specified disorders of muscle: Secondary | ICD-10-CM

## 2021-07-31 NOTE — Therapy (Signed)
[place], [place], [address] Phone: [phone]   Fax:  [phone]  Physical Therapy Treatment  Patient Details  Name: Stacey Singh MRN: [id number] Date of Birth: 1976/02/25 Referring Provider (PT): Wallace Going, DO   Encounter Date: 07/31/2021   PT End of Session - 07/31/21 0933     Visit Number 14    Number of Visits 17    Date for PT Re-Evaluation 08/09/21    Authorization Type BCBS    PT Start Time 0933    PT Stop Time 2297    PT Time Calculation (min) 42 min    Activity Tolerance Patient tolerated treatment well    Behavior During Therapy [facility] for tasks assessed/performed             Past Medical History:  Diagnosis Date   Allergy    Cancer ([place])    breast   Family history of breast cancer    Family history of leukemia    Heart murmur    in high school, resolved   History of COVID-19 08/05/2020   Hypertension     Past Surgical History:  Procedure Laterality Date   BREAST CYST EXCISION Bilateral 10/09/2020   Procedure: EXCISION OF EXCESS BREAST TISSUE;  Surgeon: Wallace Going, DO;  Location: [place];  Service: Plastics;  Laterality: Bilateral;   BREAST RECONSTRUCTION WITH PLACEMENT OF TISSUE EXPANDER AND FLEX HD (ACELLULAR HYDRATED DERMIS) Bilateral 03/13/2020   Procedure: BILATERAL BREAST RECONSTRUCTION WITH PLACEMENT OF TISSUE EXPANDERS AND FLEX HD (ACELLULAR HYDRATED DERMIS);  Surgeon: Wallace Going, DO;  Location: [place];  Service: Plastics;  Laterality: Bilateral;   CESAREAN SECTION  04/04/2012   Procedure: CESAREAN SECTION;  Surgeon: Farrel Gobble. Harrington Challenger, MD;  Location: [facility];  Service: Obstetrics;  Laterality: N/A;   CESAREAN SECTION N/A 05/19/2015   Procedure: CESAREAN SECTION;  Surgeon: Allyn Kenner, DO;  Location: [facility];  Service: Obstetrics;  Laterality: N/A;   CESAREAN SECTION     DILATION AND CURETTAGE OF UTERUS      LIPOSUCTION WITH LIPOFILLING Bilateral 10/09/2020   Procedure: LIPOSUCTION WITH LIPOFILLING;  Surgeon: Wallace Going, DO;  Location: [place];  Service: Plastics;  Laterality: Bilateral;  3 hours total, please   MASTECTOMY W/ SENTINEL NODE BIOPSY Bilateral 03/13/2020   Procedure: BILATERAL MASTECTOMY WITH RIGHT SENTINEL LYMPH NODE BIOPSY;  Surgeon: Jovita Kussmaul, MD;  Location: [place];  Service: General;  Laterality: Bilateral;  PEC BLOCK   REMOVAL OF BILATERAL TISSUE EXPANDERS WITH PLACEMENT OF BILATERAL BREAST IMPLANTS Bilateral 06/13/2020   Procedure: REMOVAL OF BILATERAL TISSUE EXPANDERS WITH PLACEMENT OF BILATERAL BREAST IMPLANTS;  Surgeon: Wallace Going, DO;  Location: [place];  Service: Plastics;  Laterality: Bilateral;   WISDOM TOOTH EXTRACTION      There were no vitals filed for this visit.   Subjective Assessment - 07/31/21 0937     Subjective Patient reports continued lateral right breast pain described as pressure that is about the same as last session.    Pertinent History history of breast cancer    Currently in Pain? Yes    Pain Score 3     Pain Location Breast    Pain Orientation Anterior;Lateral    Pain Descriptors / Indicators Pressure    Pain Onset More than a month ago    Pain Frequency Constant  [place] Adult PT Treatment:                                                DATE: 07/31/21 Therapeutic Exercise: UBE level 2, 2 min each fwd/bwd Stability ball wall walkup 1 x 10, 5 sec hold Chest fly 2 x 10 @ 25 lbs, last set therapist provided support to inferior aspect of implant Resisted rows 2 x 10 @ 35 lbs Wall push-up 1 x 10 Serratus punch 2 x 15 @ 8 lbs  Chest press 1 x 10 @ 8 lbs  Manual Therapy: N/A                               PT Short Term Goals - 06/23/21 3532       PT SHORT TERM GOAL #1   Title Patient will be independent with initial HEP.     Baseline issued at eval.    Status Achieved    Target Date 05/21/21               PT Long Term Goals - 07/29/21 0809       PT LONG TERM GOAL #1   Title Patient will report tightness/pinching at worst rated as 5/10 with bending to improve her tolerance to tying her shoes    Baseline Pt 4/10 entering clinic  3/10 with simulation of tying shoes reports 6/10 at worst    Time 8    Period Weeks    Status On-going    Target Date 08/09/21      PT LONG TERM GOAL #2   Title Patient will score at least 75% function on FOTO to signify clinically meaningful improvement in functional abilities.    Baseline 64%; 68% 11/28    Time 8    Period Weeks    Status On-going    Target Date 08/09/21      PT LONG TERM GOAL #3   Title Patient will demonstrate at least 170 degrees of Rt shoulder abduction AROM without reports of increased breast tightness to improve ability to complete overhead activities.    Baseline see flowsheet    Time 6    Period Weeks    Status Achieved    Target Date 08/09/21      PT LONG TERM GOAL #4   Title Patient will be independent with advanced home program to maintain/progress current level of function.    Baseline progressing as appropriate    Time 6    Period Weeks    Status On-going    Target Date 08/09/21      PT LONG TERM GOAL #5   Title Patient will demonstrate pain free Rt shoulder MMT to improve ability to complete recreational activity.    Baseline utilizing 5 lb wt in flexion and abd    Time 8    Target Date 08/09/21                   Plan - 07/31/21 0940     Clinical Impression Statement Taytem arrives with continued reports of lateral Rt breast pain that has remained relatively unchanged since last session. Today's session focused on chest and periscapular strengthening. With chest fly patient reported tightness along superior and inferior aspect of pectoral musculature that was reduced when therapist provided support  to inferior aspect  of implant during second set, though did not resolve. No reports of tightness/discomfort with periscapular strengthening with patient demonstrating improved posture.    Personal Factors and Comorbidities Time since onset of injury/illness/exacerbation;Profession;Comorbidity 2    Comorbidities bilateral mastecomy, expanders with fills    Examination-Activity Limitations Reach Overhead;Bend    Examination-Participation Restrictions Shop;Laundry;Meal Prep;Occupation    PT Treatment/Interventions ADLs/Self Care Home Management;Therapeutic activities;Therapeutic exercise;Neuromuscular re-education;Patient/family education;Manual techniques;Scar mobilization;Passive range of motion;Dry needling;Taping;Spinal Manipulations;Moist Heat;Cryotherapy    PT Next Visit Plan manual to Rt breast and surrounding musculature thoracic mobility, sternal/clavicle mobility, posture, potential TPDN rhomboids/multifidi, diaphragmatic breathing, shoulder/chest strengthening.    PT Home Exercise Plan Access Code: [email or web address]    Consulted and Agree with Plan of Care Patient             Patient will benefit from skilled therapeutic intervention in order to improve the following deficits and impairments:  Decreased range of motion, Increased fascial restricitons, Impaired UE functional use, Increased muscle spasms, Decreased skin integrity, Pain, Impaired flexibility, Decreased scar mobility, Postural dysfunction  Visit Diagnosis: Stiffness of right shoulder, not elsewhere classified  Breast pain, right  Abnormal increased muscle tightness  Abnormal posture     Problem List Patient Active Problem List   Diagnosis Date Noted   S/P breast reconstruction, bilateral 07/17/2020   Acquired absence of breast 03/22/2020   Breast cancer ([place]) 03/13/2020   Genetic testing 01/05/2020   Family history of breast cancer    Family history of leukemia    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

## 2021-08-04 ENCOUNTER — Ambulatory Visit: Payer: BC Managed Care – PPO

## 2021-08-04 ENCOUNTER — Other Ambulatory Visit: Payer: Self-pay

## 2021-08-04 DIAGNOSIS — R293 Abnormal posture: Secondary | ICD-10-CM

## 2021-08-04 DIAGNOSIS — M25611 Stiffness of right shoulder, not elsewhere classified: Secondary | ICD-10-CM

## 2021-08-04 DIAGNOSIS — N644 Mastodynia: Secondary | ICD-10-CM

## 2021-08-04 DIAGNOSIS — M6289 Other specified disorders of muscle: Secondary | ICD-10-CM

## 2021-08-04 NOTE — Therapy (Signed)
[place], [place], [address] Phone: [phone]   Fax:  [phone]  Physical Therapy Treatment  Patient Details  Name: Stacey Singh MRN: [id number] Date of Birth: 02-17-1976 Referring Provider (PT): Wallace Going, DO   Encounter Date: 08/04/2021   PT End of Session - 08/04/21 0719     Visit Number 15    Number of Visits 17    Date for PT Re-Evaluation 08/09/21    Authorization Type BCBS    PT Start Time 0718    PT Stop Time 0759    PT Time Calculation (min) 41 min    Activity Tolerance Patient tolerated treatment well    Behavior During Therapy [facility] for tasks assessed/performed             Past Medical History:  Diagnosis Date   Allergy    Cancer ([place])    breast   Family history of breast cancer    Family history of leukemia    Heart murmur    in high school, resolved   History of COVID-19 08/05/2020   Hypertension     Past Surgical History:  Procedure Laterality Date   BREAST CYST EXCISION Bilateral 10/09/2020   Procedure: EXCISION OF EXCESS BREAST TISSUE;  Surgeon: Wallace Going, DO;  Location: [place];  Service: Plastics;  Laterality: Bilateral;   BREAST RECONSTRUCTION WITH PLACEMENT OF TISSUE EXPANDER AND FLEX HD (ACELLULAR HYDRATED DERMIS) Bilateral 03/13/2020   Procedure: BILATERAL BREAST RECONSTRUCTION WITH PLACEMENT OF TISSUE EXPANDERS AND FLEX HD (ACELLULAR HYDRATED DERMIS);  Surgeon: Wallace Going, DO;  Location: [place];  Service: Plastics;  Laterality: Bilateral;   CESAREAN SECTION  04/04/2012   Procedure: CESAREAN SECTION;  Surgeon: Farrel Gobble. Harrington Challenger, MD;  Location: [facility];  Service: Obstetrics;  Laterality: N/A;   CESAREAN SECTION N/A 05/19/2015   Procedure: CESAREAN SECTION;  Surgeon: Allyn Kenner, DO;  Location: [facility];  Service: Obstetrics;  Laterality: N/A;   CESAREAN SECTION     DILATION AND CURETTAGE OF UTERUS      LIPOSUCTION WITH LIPOFILLING Bilateral 10/09/2020   Procedure: LIPOSUCTION WITH LIPOFILLING;  Surgeon: Wallace Going, DO;  Location: [place];  Service: Plastics;  Laterality: Bilateral;  3 hours total, please   MASTECTOMY W/ SENTINEL NODE BIOPSY Bilateral 03/13/2020   Procedure: BILATERAL MASTECTOMY WITH RIGHT SENTINEL LYMPH NODE BIOPSY;  Surgeon: Jovita Kussmaul, MD;  Location: [place];  Service: General;  Laterality: Bilateral;  PEC BLOCK   REMOVAL OF BILATERAL TISSUE EXPANDERS WITH PLACEMENT OF BILATERAL BREAST IMPLANTS Bilateral 06/13/2020   Procedure: REMOVAL OF BILATERAL TISSUE EXPANDERS WITH PLACEMENT OF BILATERAL BREAST IMPLANTS;  Surgeon: Wallace Going, DO;  Location: [place];  Service: Plastics;  Laterality: Bilateral;   WISDOM TOOTH EXTRACTION      There were no vitals filed for this visit.   Subjective Assessment - 08/04/21 0720     Subjective "Same; tight. I made sure to wear my athletic bra, but no difference." She reports more soreness along superior aspect of Rt chest after last session. She feels that her back has been feeling really good lately.    Currently in Pain? Yes    Pain Score 3     Pain Location Breast    Pain Orientation Anterior    Pain Descriptors / Indicators Tightness    Pain Type Chronic pain    Pain Onset More than a month  ago    Pain Frequency Constant    Aggravating Factors  reaching, chest activation    Pain Relieving Factors stretching                   [facility] Adult PT Treatment:                                                DATE: 08/04/21 Therapeutic Exercise: UBE level 2, 2 min each fwd/bwd Curl to overhead press 2 x 10 green band   Pec stretch on roller 60 sec  Kettlebell swings 10# 2 x 10  Chest fly 2 x 10 @ 6 lbs Overhead carry 4 x 20 ft Updated HEP.  Manual Therapy: N/A                         PT Short Term Goals - 06/23/21 5188       PT  SHORT TERM GOAL #1   Title Patient will be independent with initial HEP.    Baseline issued at eval.    Status Achieved    Target Date 05/21/21               PT Long Term Goals - 07/29/21 0809       PT LONG TERM GOAL #1   Title Patient will report tightness/pinching at worst rated as 5/10 with bending to improve her tolerance to tying her shoes    Baseline Pt 4/10 entering clinic  3/10 with simulation of tying shoes reports 6/10 at worst    Time 8    Period Weeks    Status On-going    Target Date 08/09/21      PT LONG TERM GOAL #2   Title Patient will score at least 75% function on FOTO to signify clinically meaningful improvement in functional abilities.    Baseline 64%; 68% 11/28    Time 8    Period Weeks    Status On-going    Target Date 08/09/21      PT LONG TERM GOAL #3   Title Patient will demonstrate at least 170 degrees of Rt shoulder abduction AROM without reports of increased breast tightness to improve ability to complete overhead activities.    Baseline see flowsheet    Time 6    Period Weeks    Status Achieved    Target Date 08/09/21      PT LONG TERM GOAL #4   Title Patient will be independent with advanced home program to maintain/progress current level of function.    Baseline progressing as appropriate    Time 6    Period Weeks    Status On-going    Target Date 08/09/21      PT LONG TERM GOAL #5   Title Patient will demonstrate pain free Rt shoulder MMT to improve ability to complete recreational activity.    Baseline utilizing 5 lb wt in flexion and abd    Time 8    Target Date 08/09/21                   Plan - 08/04/21 0727     Clinical Impression Statement Continued to focus on targeted strengthening of the chest and periscapular musculature. She reported tightness in the Rt pectoral musculature during ther ex and intermittent complaints of discomfort that  seemed to decrease with continued reps of targeted pec and overhead  strengthening. She demonstrates good posture with all strengthening, though has difficulty coordinating appropriate movement of kettlebell swing.    Comorbidities bilateral mastecomy, expanders with fills    PT Treatment/Interventions ADLs/Self Care Home Management;Therapeutic activities;Therapeutic exercise;Neuromuscular re-education;Patient/family education;Manual techniques;Scar mobilization;Passive range of motion;Dry needling;Taping;Spinal Manipulations;Moist Heat;Cryotherapy    PT Next Visit Plan manual to Rt breast and surrounding musculature thoracic mobility, sternal/clavicle mobility, posture, potential TPDN rhomboids/multifidi, diaphragmatic breathing, shoulder/chest strengthening.    PT Home Exercise Plan Access Code: [email or web address]    Consulted and Agree with Plan of Care Patient             Patient will benefit from skilled therapeutic intervention in order to improve the following deficits and impairments:  Decreased range of motion, Increased fascial restricitons, Impaired UE functional use, Increased muscle spasms, Decreased skin integrity, Pain, Impaired flexibility, Decreased scar mobility, Postural dysfunction  Visit Diagnosis: Stiffness of right shoulder, not elsewhere classified  Breast pain, right  Abnormal increased muscle tightness  Abnormal posture     Problem List Patient Active Problem List   Diagnosis Date Noted   S/P breast reconstruction, bilateral 07/17/2020   Acquired absence of breast 03/22/2020   Breast cancer ([place]) 03/13/2020   Genetic testing 01/05/2020   Family history of breast cancer    Family history of leukemia    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 [place], [place], [address] Phone:  [phone]   Fax:  [phone]  Name: Stacey Singh MRN: [id number] Date of Birth: 1976/01/31

## 2021-08-07 ENCOUNTER — Other Ambulatory Visit: Payer: Self-pay

## 2021-08-07 ENCOUNTER — Ambulatory Visit: Payer: BC Managed Care – PPO

## 2021-08-07 DIAGNOSIS — R293 Abnormal posture: Secondary | ICD-10-CM

## 2021-08-07 DIAGNOSIS — M6289 Other specified disorders of muscle: Secondary | ICD-10-CM

## 2021-08-07 DIAGNOSIS — M25611 Stiffness of right shoulder, not elsewhere classified: Secondary | ICD-10-CM | POA: Diagnosis not present

## 2021-08-07 DIAGNOSIS — N644 Mastodynia: Secondary | ICD-10-CM

## 2021-08-07 NOTE — Therapy (Signed)
[place], [place], [address] Phone: [phone]   Fax:  [phone]  Physical Therapy Treatment/Discharge  Patient Details  Name: Stacey Singh MRN: [id number] Date of Birth: 1976/02/20 Referring Provider (PT): Wallace Going, DO   Encounter Date: 08/07/2021   PT End of Session - 08/07/21 0932     Visit Number 16    Number of Visits 17    Date for PT Re-Evaluation 08/09/21    Authorization Type BCBS    PT Start Time 0932    PT Stop Time 1011    PT Time Calculation (min) 39 min    Activity Tolerance Patient tolerated treatment well    Behavior During Therapy [facility] for tasks assessed/performed             Past Medical History:  Diagnosis Date   Allergy    Cancer ([place])    breast   Family history of breast cancer    Family history of leukemia    Heart murmur    in high school, resolved   History of COVID-19 08/05/2020   Hypertension     Past Surgical History:  Procedure Laterality Date   BREAST CYST EXCISION Bilateral 10/09/2020   Procedure: EXCISION OF EXCESS BREAST TISSUE;  Surgeon: Wallace Going, DO;  Location: [place];  Service: Plastics;  Laterality: Bilateral;   BREAST RECONSTRUCTION WITH PLACEMENT OF TISSUE EXPANDER AND FLEX HD (ACELLULAR HYDRATED DERMIS) Bilateral 03/13/2020   Procedure: BILATERAL BREAST RECONSTRUCTION WITH PLACEMENT OF TISSUE EXPANDERS AND FLEX HD (ACELLULAR HYDRATED DERMIS);  Surgeon: Wallace Going, DO;  Location: [place];  Service: Plastics;  Laterality: Bilateral;   CESAREAN SECTION  04/04/2012   Procedure: CESAREAN SECTION;  Surgeon: Farrel Gobble. Harrington Challenger, MD;  Location: [facility];  Service: Obstetrics;  Laterality: N/A;   CESAREAN SECTION N/A 05/19/2015   Procedure: CESAREAN SECTION;  Surgeon: Allyn Kenner, DO;  Location: [facility];  Service: Obstetrics;  Laterality: N/A;   CESAREAN SECTION     DILATION AND CURETTAGE OF  UTERUS     LIPOSUCTION WITH LIPOFILLING Bilateral 10/09/2020   Procedure: LIPOSUCTION WITH LIPOFILLING;  Surgeon: Wallace Going, DO;  Location: [place];  Service: Plastics;  Laterality: Bilateral;  3 hours total, please   MASTECTOMY W/ SENTINEL NODE BIOPSY Bilateral 03/13/2020   Procedure: BILATERAL MASTECTOMY WITH RIGHT SENTINEL LYMPH NODE BIOPSY;  Surgeon: Jovita Kussmaul, MD;  Location: [place];  Service: General;  Laterality: Bilateral;  PEC BLOCK   REMOVAL OF BILATERAL TISSUE EXPANDERS WITH PLACEMENT OF BILATERAL BREAST IMPLANTS Bilateral 06/13/2020   Procedure: REMOVAL OF BILATERAL TISSUE EXPANDERS WITH PLACEMENT OF BILATERAL BREAST IMPLANTS;  Surgeon: Wallace Going, DO;  Location: [place];  Service: Plastics;  Laterality: Bilateral;   WISDOM TOOTH EXTRACTION      There were no vitals filed for this visit.   Subjective Assessment - 08/07/21 0933     Subjective Patient reports she is feeling about the same. She feels restricted with her movement and feels that the tightness and pinching still occurs deep in the Rt chest feeling as though it is underneath the implant. She has been wearing a supportive bra that has helped to keep her pain levels down, but does not resolve her pain. She feels that she can move her neck better, her back is not as tight, and she knows what stretches to use when she feels extreme tightness, and what  to continue as far as exercises go for her shoulder weakness outside of PT. Her initial complaint of tightness and lack of mobility has been somewhat helped in PT, but has not gone away at this point.    Currently in Pain? Yes    Pain Score 3     Pain Location Breast    Pain Orientation Right;Distal    Pain Descriptors / Indicators Tightness    Pain Type Chronic pain    Pain Onset More than a month ago    Pain Frequency Intermittent    Aggravating Factors  reaching, lifting, pulling, bending    Pain  Relieving Factors warm showers                [facility] PT Assessment - 08/07/21 0001       Observation/Other Assessments   Focus on Therapeutic Outcomes (FOTO)  62% function      AROM   Right Shoulder Flexion 180 Degrees    Right Shoulder ABduction 180 Degrees    Right Shoulder Internal Rotation 80 Degrees    Right Shoulder External Rotation 90 Degrees      Strength   Overall Strength Comments Bilateral shoulder strength 5/5 minor discomfort with horizontal adduction on the RUE      Palpation   Spinal mobility T-spine mobility WNL    Palpation comment mild TTP Rt latissismus dorsi                   [facility] Adult PT Treatment:                                                DATE: 08/07/21 Therapeutic Exercise: Reviewed and updated advanced HEP.   Therapeutic Activity: Education on re-assessment findings and progress towards goals. D/C education recommending that patient f/u with Dr. Marla Roe due to ongoing pain.                     PT Short Term Goals - 08/07/21 0941       PT SHORT TERM GOAL #1   Title Patient will be independent with initial HEP.    Baseline issued at eval.    Status Achieved    Target Date 05/21/21               PT Long Term Goals - 08/07/21 0941       PT LONG TERM GOAL #1   Title Patient will report tightness/pinching at worst rated as 5/10 with bending to improve her tolerance to tying her shoes    Baseline Pt 3/10 currently; at worst 8/10 with bending, pulling, reaching    Time 8    Period Weeks    Status Not Met    Target Date 08/09/21      PT LONG TERM GOAL #2   Title Patient will score at least 75% function on FOTO to signify clinically meaningful improvement in functional abilities.    Baseline 64%; 68% 11/28; 62% function 1/12    Time 8    Period Weeks    Status Not Met    Target Date 08/09/21      PT LONG TERM GOAL #3   Title Patient will demonstrate at least 170 degrees of Rt shoulder abduction  AROM without reports of increased breast tightness to improve ability to complete overhead activities.  Baseline see flowsheet    Time 6    Period Weeks    Status Achieved    Target Date 08/09/21      PT LONG TERM GOAL #4   Title Patient will be independent with advanced home program to maintain/progress current level of function.    Time 6    Period Weeks    Status Achieved    Target Date 08/09/21      PT LONG TERM GOAL #5   Title Patient will demonstrate pain free Rt shoulder MMT to improve ability to complete recreational activity.    Baseline see flowsheet    Time 8    Status Partially Met    Target Date 08/09/21                   Plan - 08/07/21 0940     Clinical Impression Statement Stacey Singh has attended 16 PT sessions demonstrating improvements in Rt shoulder AROM and strength, improved thoracic spine/chest mobility, and decreased chest musculature tautness and tenderness since the start of care. Despite these objective improvements she continues to report a sensation of tightness and pinching about the Rt anterior chest that she feels is deep to her implant worsened with reaching, bending, and pulling activity. At this time it is recommended that she f/u with referring provider for further evaluation as she potentially requires further imaging to determine the cause of her lingering tightness and pinching sensation. Patient in agreement with this plan and has plans to schedule a f/u with Dr. Marla Roe.    Comorbidities bilateral mastecomy, expanders with fills    PT Treatment/Interventions ADLs/Self Care Home Management;Therapeutic activities;Therapeutic exercise;Neuromuscular re-education;Patient/family education;Manual techniques;Scar mobilization;Passive range of motion;Dry needling;Taping;Spinal Manipulations;Moist Heat;Cryotherapy    PT Home Exercise Plan Access Code: [email or web address]    Recommended Other Services f/u with Dr. Chalmers Guest and Agree with Plan of  Care Patient             Patient will benefit from skilled therapeutic intervention in order to improve the following deficits and impairments:  Decreased range of motion, Increased fascial restricitons, Impaired UE functional use, Increased muscle spasms, Decreased skin integrity, Pain, Impaired flexibility, Decreased scar mobility, Postural dysfunction  Visit Diagnosis: Stiffness of right shoulder, not elsewhere classified  Breast pain, right  Abnormal increased muscle tightness  Abnormal posture     Problem List Patient Active Problem List   Diagnosis Date Noted   S/P breast reconstruction, bilateral 07/17/2020   Acquired absence of breast 03/22/2020   Breast cancer ([place]) 03/13/2020   Genetic testing 01/05/2020   Family history of breast cancer    Family history of leukemia    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 Patient goals were partially met. Patient is being discharged due to maximized rehab potential.  Gwendolyn Grant, PT, DPT, ATC 08/07/21 2:15 PM  [facility] [address] [place], [place], [address] Phone: [phone]   Fax:  [phone]  Name: Stacey Singh MRN: [id number] Date of Birth: 05/01/1976

## 2021-08-12 ENCOUNTER — Encounter (INDEPENDENT_AMBULATORY_CARE_PROVIDER_SITE_OTHER): Payer: Self-pay | Admitting: Bariatrics

## 2021-08-12 ENCOUNTER — Other Ambulatory Visit: Payer: Self-pay

## 2021-08-12 ENCOUNTER — Ambulatory Visit (INDEPENDENT_AMBULATORY_CARE_PROVIDER_SITE_OTHER): Payer: BC Managed Care – PPO | Admitting: Bariatrics

## 2021-08-12 VITALS — BP 131/84 | HR 84 | Temp 98.0°F | Ht 64.0 in | Wt 236.0 lb

## 2021-08-12 DIAGNOSIS — R7309 Other abnormal glucose: Secondary | ICD-10-CM

## 2021-08-12 DIAGNOSIS — Z1331 Encounter for screening for depression: Secondary | ICD-10-CM | POA: Diagnosis not present

## 2021-08-12 DIAGNOSIS — I10 Essential (primary) hypertension: Secondary | ICD-10-CM

## 2021-08-12 DIAGNOSIS — E7800 Pure hypercholesterolemia, unspecified: Secondary | ICD-10-CM

## 2021-08-12 DIAGNOSIS — R5383 Other fatigue: Secondary | ICD-10-CM | POA: Diagnosis not present

## 2021-08-12 DIAGNOSIS — Z6841 Body Mass Index (BMI) 40.0 and over, adult: Secondary | ICD-10-CM

## 2021-08-12 DIAGNOSIS — R0602 Shortness of breath: Secondary | ICD-10-CM | POA: Diagnosis not present

## 2021-08-12 DIAGNOSIS — E559 Vitamin D deficiency, unspecified: Secondary | ICD-10-CM | POA: Diagnosis not present

## 2021-08-12 NOTE — Progress Notes (Signed)
Chief Complaint:   OBESITY Stacey Singh (MR# [id number]) is a 46 y.o. female who presents for evaluation and treatment of obesity and related comorbidities. Current BMI is Body mass index is 40.51 kg/m. Stacey Singh has been struggling with her weight for many years and has been unsuccessful in either losing weight, maintaining weight loss, or reaching her healthy weight goal.  Stacey Singh states she does like to cook if she has time. She craves salty and sweets, and she dislikes certain vegetables. She has been on [place] for the past 3 months.  Stacey Singh is currently in the action stage of change and ready to dedicate time achieving and maintaining a healthier weight. Stacey Singh is interested in becoming our patient and working on intensive lifestyle modifications including (but not limited to) diet and exercise for weight loss.  Stacey Singh's habits were reviewed today and are as follows: Her family eats meals together, she thinks her family will eat healthier with her, her desired weight loss is 60 pounds, she has been heavy most of her life, she started gaining weight when she started trying to have kids, her heaviest weight ever was 244 pounds, she has significant food cravings issues, she skips meals frequently, she is frequently drinking liquids with calories, she frequently makes poor food choices, she frequently eats larger portions than normal, she has binge eating behaviors, and she struggles with emotional eating.  Depression Screen Stacey Singh's Food and Mood (modified PHQ-9) score was 7.  Depression screen Stacey Singh 2/9 08/12/2021  Decreased Interest 3  Down, Depressed, Hopeless 1  PHQ - 2 Score 4  Altered sleeping 0  Tired, decreased energy 1  Change in appetite 1  Feeling bad or failure about yourself  1  Trouble concentrating 0  Moving slowly or fidgety/restless 0  Suicidal thoughts 0  PHQ-9 Score 7  Difficult doing work/chores Not difficult at all   Subjective:   1. Other fatigue Stacey Singh denies  daytime somnolence and denies waking up still tired. Patent has a history of symptoms of hypertension. Stacey Singh generally gets 7 hours of sleep per night, and states that she has generally restful sleep. Snoring is not present. Apneic episodes is not present. Epworth Sleepiness Score is 4.   2. SOB (shortness of breath) on exertion Stacey Singh notes increasing shortness of breath with exercising and seems to be worsening over time with weight gain. She notes getting out of breath sooner with activity than she used to. This has not gotten worse recently. Stacey Singh denies shortness of breath at rest or orthopnea.   3. Essential hypertension Stacey Singh is currently taking olmesartan. Her blood pressure is controlled. Her blood pressure was 131/84 today.  4. Vitamin D deficiency Stacey Singh is currently not taking Vitamin D supplementation.  5. Elevated glucose Stacey Singh has a history of elevated glucose.  6. Elevated cholesterol Stacey Singh is not on medications currently.  Assessment/Plan:   1. Other fatigue Stacey Singh does not feel that her weight is causing her energy to be lower than it should be. Fatigue may be related to obesity, depression or many other causes. Labs will be ordered, and in the meanwhile, Stacey Singh will focus on self care including making healthy food choices, increasing physical activity and focusing on stress reduction.   - EKG 12-Lead - TSH+T4F+T3Free  2. SOB (shortness of breath) on exertion Stacey Singh does not feel that she gets out of breath more easily that she used to when she exercises. Stacey Singh's shortness of breath appears to be obesity related and exercise induced. She  has agreed to work on weight loss and gradually increase exercise to treat her exercise induced shortness of breath. Will continue to monitor closely.   - TSH+T4F+T3Free  3. Essential hypertension Stacey Singh will continue her medications. We will check CMP today.is working on healthy weight loss and exercise to improve blood pressure control.  We will watch for signs of hypotension as she continues her lifestyle modifications.  - Comprehensive metabolic panel  4. Vitamin D deficiency Low Vitamin D level contributes to fatigue and are associated with obesity, breast, and colon cancer. We will check Vitamin D today and Stacey Singh will follow-up for routine testing of Vitamin D, at least 2-3 times per year to avoid over-replacement.  - VITAMIN D 25 Hydroxy (Vit-D Deficiency, Fractures)  5. Elevated glucose We will check A1C and insulin today.   - Hemoglobin A1c - Insulin, random  6. Elevated cholesterol Cardiovascular risk and specific lipid/LDL goals reviewed.  We will check Lipids today. We discussed several lifestyle modifications today and Stacey Singh will continue to work on diet, exercise and weight loss efforts. Orders and follow up as documented in patient record.   Counseling Intensive lifestyle modifications are the first line treatment for this issue. Dietary changes: Increase soluble fiber. Decrease simple carbohydrates. Exercise changes: Moderate to vigorous-intensity aerobic activity 150 minutes per week if tolerated. Lipid-lowering medications: see documented in medical record.  - Lipid Panel With LDL/HDL Ratio  7. Depression screen Stacey Singh had a positive depression screening. Depression is commonly associated with obesity and often results in emotional eating behaviors. We will monitor this closely and work on CBT to help improve the non-hunger eating patterns. Referral to Psychology may be required if no improvement is seen as she continues in our clinic.   8. Class 3 severe obesity with serious comorbidity and body mass index (BMI) of 40.0 to 44.9 in adult, unspecified obesity type (HCC) Stacey Singh is currently in the action stage of change and her goal is to continue with weight loss efforts. I recommend Stacey Singh begin the structured treatment plan as follows:  She has agreed to the Category 2 Plan.  Stacey Singh will continue meal  planning and she will continue intentional eating. She will not meal skip and she will have no sugary drinks. We reviewed labs from 11/105/2022 CMP and glucose.  Exercise goals: No exercise has been prescribed at this time.   Behavioral modification strategies: increasing lean protein intake, decreasing simple carbohydrates, increasing vegetables, increasing water intake, decreasing eating out, no skipping meals, meal planning and cooking strategies, keeping healthy foods in the home, and planning for success.  She was informed of the importance of frequent follow-up visits to maximize her success with intensive lifestyle modifications for her multiple health conditions. She was informed we would discuss her lab results at her next visit unless there is a critical issue that needs to be addressed sooner. Stacey Singh agreed to keep her next visit at the agreed upon time to discuss these results.  Objective:   Blood pressure 131/84, pulse 84, temperature 98 F (36.7 C), height 5' 4"  (1.626 m), weight 236 lb (107 kg), last menstrual period 08/03/2021, SpO2 98 %. Body mass index is 40.51 kg/m.  EKG: Normal sinus rhythm, rate 75.  Indirect Calorimeter completed today shows a VO2 of 273 and a REE of 1886.  Her calculated basal metabolic rate is 5364 thus her basal metabolic rate is better than expected.  General: Cooperative, alert, well developed, in no acute distress. HEENT: Conjunctivae and lids unremarkable. Cardiovascular: Regular  rhythm.  Lungs: Normal work of breathing. Neurologic: No focal deficits.   Lab Results  Component Value Date   CREATININE 0.68 12/29/2017   BUN 8 12/29/2017   NA 143 12/29/2017   K 4.5 12/29/2017   CL 107 (H) 12/29/2017   CO2 22 12/29/2017   Lab Results  Component Value Date   ALT 24 12/29/2017   AST 21 12/29/2017   ALKPHOS 66 12/29/2017   BILITOT 0.2 12/29/2017   Lab Results  Component Value Date   HGBA1C 5.5 12/29/2017   No results found for:  INSULIN Lab Results  Component Value Date   TSH 2.460 12/29/2017   Lab Results  Component Value Date   CHOL 194 12/29/2017   HDL 47 12/29/2017   LDLCALC 132 (H) 12/29/2017   TRIG 77 12/29/2017   CHOLHDL 4.1 12/29/2017   Lab Results  Component Value Date   WBC 10.0 05/20/2015   HGB 10.7 (L) 05/20/2015   HCT 32.0 (L) 05/20/2015   MCV 92.2 05/20/2015   PLT 179 05/20/2015   No results found for: IRON, TIBC, FERRITIN  Attestation Statements:   Reviewed by clinician on day of visit: allergies, medications, problem list, medical history, surgical history, family history, social history, and previous encounter notes.  I, Lizbeth Bark, RMA, am acting as Location manager for [employer], DO.  I have reviewed the above documentation for accuracy and completeness, and I agree with the above. Jearld Lesch, DO

## 2021-08-13 ENCOUNTER — Encounter (INDEPENDENT_AMBULATORY_CARE_PROVIDER_SITE_OTHER): Payer: Self-pay | Admitting: Bariatrics

## 2021-08-13 DIAGNOSIS — Z6841 Body Mass Index (BMI) 40.0 and over, adult: Secondary | ICD-10-CM | POA: Insufficient documentation

## 2021-08-13 LAB — COMPREHENSIVE METABOLIC PANEL
ALT: 10 IU/L (ref 0–32)
AST: 12 IU/L (ref 0–40)
Albumin/Globulin Ratio: 1.3 (ref 1.2–2.2)
Albumin: 4 g/dL (ref 3.8–4.8)
Alkaline Phosphatase: 71 IU/L (ref 44–121)
BUN/Creatinine Ratio: 27 — ABNORMAL HIGH (ref 9–23)
BUN: 15 mg/dL (ref 6–24)
Bilirubin Total: <0.2 mg/dL (ref 0.0–1.2)
CO2: 21 mmol/L (ref 20–29)
Calcium: 8.7 mg/dL (ref 8.7–10.2)
Chloride: 106 mmol/L (ref 96–106)
Creatinine, Ser: 0.55 mg/dL — ABNORMAL LOW (ref 0.57–1.00)
Globulin, Total: 3 g/dL (ref 1.5–4.5)
Glucose: 82 mg/dL (ref 70–99)
Potassium: 4.8 mmol/L (ref 3.5–5.2)
Sodium: 142 mmol/L (ref 134–144)
Total Protein: 7 g/dL (ref 6.0–8.5)
eGFR: 115 mL/min/{1.73_m2} (ref >59)

## 2021-08-13 LAB — TSH+T4F+T3FREE
Free T4: 1.16 ng/dL (ref 0.82–1.77)
T3, Free: 3.1 pg/mL (ref 2.0–4.4)
TSH: 2.19 u[IU]/mL (ref 0.450–4.500)

## 2021-08-13 LAB — LIPID PANEL WITH LDL/HDL RATIO
Cholesterol, Total: 193 mg/dL (ref 100–199)
HDL: 56 mg/dL (ref >39)
LDL Chol Calc (NIH): 127 mg/dL — ABNORMAL HIGH (ref 0–99)
LDL/HDL Ratio: 2.3 ratio (ref 0.0–3.2)
Triglycerides: 55 mg/dL (ref 0–149)
VLDL Cholesterol Cal: 10 mg/dL (ref 5–40)

## 2021-08-13 LAB — VITAMIN D 25 HYDROXY (VIT D DEFICIENCY, FRACTURES): Vit D, 25-Hydroxy: 36 ng/mL (ref 30.0–100.0)

## 2021-08-13 LAB — HEMOGLOBIN A1C
Est. average glucose Bld gHb Est-mCnc: 111 mg/dL
Hgb A1c MFr Bld: 5.5 % (ref 4.8–5.6)

## 2021-08-13 LAB — INSULIN, RANDOM: INSULIN: 10.1 u[IU]/mL (ref 2.6–24.9)

## 2021-08-26 ENCOUNTER — Ambulatory Visit (INDEPENDENT_AMBULATORY_CARE_PROVIDER_SITE_OTHER): Payer: BC Managed Care – PPO | Admitting: Bariatrics

## 2021-08-26 ENCOUNTER — Encounter (INDEPENDENT_AMBULATORY_CARE_PROVIDER_SITE_OTHER): Payer: Self-pay | Admitting: Bariatrics

## 2021-08-26 ENCOUNTER — Other Ambulatory Visit: Payer: Self-pay

## 2021-08-26 VITALS — BP 121/80 | HR 102 | Temp 98.2°F | Ht 64.0 in | Wt 232.0 lb

## 2021-08-26 DIAGNOSIS — E559 Vitamin D deficiency, unspecified: Secondary | ICD-10-CM

## 2021-08-26 DIAGNOSIS — E8881 Metabolic syndrome: Secondary | ICD-10-CM

## 2021-08-26 DIAGNOSIS — E669 Obesity, unspecified: Secondary | ICD-10-CM

## 2021-08-26 DIAGNOSIS — Z6839 Body mass index (BMI) 39.0-39.9, adult: Secondary | ICD-10-CM

## 2021-08-26 DIAGNOSIS — F509 Eating disorder, unspecified: Secondary | ICD-10-CM

## 2021-08-26 MED ORDER — VITAMIN D (ERGOCALCIFEROL) 1.25 MG (50000 UNIT) PO CAPS: 50000.0000 [IU] | ORAL_CAPSULE | ORAL | 0 refills | Status: DC

## 2021-08-26 MED ORDER — BUPROPION HCL ER (SR) 150 MG PO TB12: 150.0000 mg | ORAL_TABLET | Freq: Every day | ORAL | 0 refills | Status: DC

## 2021-08-27 ENCOUNTER — Encounter (INDEPENDENT_AMBULATORY_CARE_PROVIDER_SITE_OTHER): Payer: Self-pay | Admitting: Bariatrics

## 2021-08-27 NOTE — Progress Notes (Signed)
Chief Complaint:   OBESITY Stacey Singh is here to discuss her progress with her obesity treatment plan along with follow-up of her obesity related diagnoses. Stacey Singh is on the Category 2 Plan and states she is following her eating plan approximately 75-80%% of the time. Stacey Singh states she is doing 0 minutes 0 times per week.  Today's visit was #: 2 Starting weight: 236 lbs Starting date: 08/12/2021 Today's weight: 232 lbs Today's date: 08/26/2021 Total lbs lost to date: 4 lbs Total lbs lost since last in-office visit: 4 lbs  Interim History: Stacey Singh is down 4 lbs since her initial visit. She states that her appetite is higher at night.  Subjective:   1. Insulin resistance Stacey Singh's last insulin level was 10.1. Her last A1C was 5.5.  2. Vitamin D insufficiency Stacey Singh is currently taking Vitamin D. Her last Vitamin D level was 36.0.  3. Eating disorder, unspecified type Stacey Singh notes no signs or kidney stones. She notes stress/emotional.  Assessment/Plan:   1. Insulin resistance Handouts on Insulin Resistance was provided today. Stacey Singh will continue to work on weight loss, exercise, and decreasing simple carbohydrates to help decrease the risk of diabetes. Stacey Singh agreed to follow-up with [place] as directed to closely monitor her progress.  2. Vitamin D insufficiency Low Vitamin D level contributes to fatigue and are associated with obesity, breast, and colon cancer. We will refill prescription Vitamin D 50,000 IU every week for 1 month with no refills and Stacey Singh will follow-up for routine testing of Vitamin D, at least 2-3 times per year to avoid over-replacement.  - Vitamin D, Ergocalciferol, (DRISDOL) 1.25 MG (50000 UNIT) CAPS capsule; Take 1 capsule (50,000 Units total) by mouth every 7 (seven) days.  Dispense: 4 capsule; Refill: 0  3. Eating disorder, unspecified type Handout for disordered eating was provided today. We will refill bupropion SR 150 mg for 1 month with no refills.Behavior  modification techniques were discussed today to help Stacey Singh deal with her emotional/non-hunger eating behaviors.  Orders and follow up as documented in patient record.    - buPROPion (WELLBUTRIN SR) 150 MG 12 hr tablet; Take 1 tablet (150 mg total) by mouth daily.  Dispense: 30 tablet; Refill: 0  4. Obesity, current BMI 39.9 Stacey Singh is currently in the action stage of change. As such, her goal is to continue with weight loss efforts. She has agreed to the Category 2 Plan.   We reviewed labs from 08/12/2021 CMP, Lipid, Vitamin D, A1C, insulin and thyroid panel. Stacey Singh will adhere to the plan 80-90%. She will choose better snacks.  Exercise goals: No exercise has been prescribed at this time.  Behavioral modification strategies: increasing lean protein intake, decreasing simple carbohydrates, increasing vegetables, increasing water intake, decreasing eating out, no skipping meals, meal planning and cooking strategies, keeping healthy foods in the home, and planning for success.  Stacey Singh has agreed to follow-up with our clinic in 2 weeks. She was informed of the importance of frequent follow-up visits to maximize her success with intensive lifestyle modifications for her multiple health conditions.   Objective:   Blood pressure 121/80, pulse (!) 102, temperature 98.2 F (36.8 C), height 5' 4"  (1.626 m), weight 232 lb (105.2 kg), last menstrual period 08/03/2021, SpO2 97 %. Body mass index is 39.82 kg/m.  General: Cooperative, alert, well developed, in no acute distress. HEENT: Conjunctivae and lids unremarkable. Cardiovascular: Regular rhythm.  Lungs: Normal work of breathing. Neurologic: No focal deficits.   Lab Results  Component Value Date  CREATININE 0.55 (L) 08/12/2021   BUN 15 08/12/2021   NA 142 08/12/2021   K 4.8 08/12/2021   CL 106 08/12/2021   CO2 21 08/12/2021   Lab Results  Component Value Date   ALT 10 08/12/2021   AST 12 08/12/2021   ALKPHOS 71 08/12/2021   BILITOT <0.2  08/12/2021   Lab Results  Component Value Date   HGBA1C 5.5 08/12/2021   HGBA1C 5.5 12/29/2017   Lab Results  Component Value Date   INSULIN 10.1 08/12/2021   Lab Results  Component Value Date   TSH 2.190 08/12/2021   Lab Results  Component Value Date   CHOL 193 08/12/2021   HDL 56 08/12/2021   LDLCALC 127 (H) 08/12/2021   TRIG 55 08/12/2021   CHOLHDL 4.1 12/29/2017   Lab Results  Component Value Date   VD25OH 36.0 08/12/2021   Lab Results  Component Value Date   WBC 10.0 05/20/2015   HGB 10.7 (L) 05/20/2015   HCT 32.0 (L) 05/20/2015   MCV 92.2 05/20/2015   PLT 179 05/20/2015   No results found for: IRON, TIBC, FERRITIN  Attestation Statements:   Reviewed by clinician on day of visit: allergies, medications, problem list, medical history, surgical history, family history, social history, and previous encounter notes.  I, Lizbeth Bark, RMA, am acting as Location manager for [employer], DO.  I have reviewed the above documentation for accuracy and completeness, and I agree with the above. -  Grace Bushy, DO

## 2021-09-03 ENCOUNTER — Encounter (INDEPENDENT_AMBULATORY_CARE_PROVIDER_SITE_OTHER): Payer: Self-pay | Admitting: Bariatrics

## 2021-09-03 NOTE — Telephone Encounter (Signed)
Dr.Brown 

## 2021-09-04 NOTE — Telephone Encounter (Signed)
Please review

## 2021-09-10 ENCOUNTER — Ambulatory Visit (INDEPENDENT_AMBULATORY_CARE_PROVIDER_SITE_OTHER): Payer: BC Managed Care – PPO | Admitting: Bariatrics

## 2021-09-10 ENCOUNTER — Encounter (INDEPENDENT_AMBULATORY_CARE_PROVIDER_SITE_OTHER): Payer: Self-pay | Admitting: Bariatrics

## 2021-09-10 ENCOUNTER — Other Ambulatory Visit: Payer: Self-pay

## 2021-09-10 VITALS — BP 118/79 | HR 94 | Temp 98.1°F | Ht 64.0 in | Wt 230.0 lb

## 2021-09-10 DIAGNOSIS — E669 Obesity, unspecified: Secondary | ICD-10-CM | POA: Diagnosis not present

## 2021-09-10 DIAGNOSIS — Z6841 Body Mass Index (BMI) 40.0 and over, adult: Secondary | ICD-10-CM

## 2021-09-10 DIAGNOSIS — E559 Vitamin D deficiency, unspecified: Secondary | ICD-10-CM | POA: Diagnosis not present

## 2021-09-10 DIAGNOSIS — Z6839 Body mass index (BMI) 39.0-39.9, adult: Secondary | ICD-10-CM

## 2021-09-10 DIAGNOSIS — F509 Eating disorder, unspecified: Secondary | ICD-10-CM | POA: Diagnosis not present

## 2021-09-10 MED ORDER — VITAMIN D (ERGOCALCIFEROL) 1.25 MG (50000 UNIT) PO CAPS: 50000.0000 [IU] | ORAL_CAPSULE | ORAL | 0 refills | Status: DC

## 2021-09-10 MED ORDER — BUPROPION HCL ER (SR) 150 MG PO TB12: 150.0000 mg | ORAL_TABLET | Freq: Every day | ORAL | 0 refills | Status: DC

## 2021-09-10 NOTE — Progress Notes (Signed)
Chief Complaint:   OBESITY Stacey Singh is here to discuss her progress with her obesity treatment plan along with follow-up of her obesity related diagnoses. Stacey Singh is on the Category 2 Plan and states she is following her eating plan approximately 80-85% of the time. Stacey Singh states she is doing 0 minutes 0 times per week.  Today's visit was #: 3 Starting weight: 236 lbs Starting date: 08/12/2021 Today's weight: 230 lbs Today's date: 09/10/2021 Total lbs lost to date: 6 lbs Total lbs lost since last in-office visit: 2 lbs  Interim History: Stacey Singh is down an additional 2 lbs since her last visit. She has cut down on her soda significantly.  Subjective:   1. Vitamin D insufficiency Stacey Singh is taking Vitamin D as directed. She gets limited sun exposure.   2. Eating disorder, unspecified type Stacey Singh is taking Wellbutrin as directed. She states it is helping with her appetite. .  Assessment/Plan:   1. Vitamin D insufficiency Low Vitamin D level contributes to fatigue and are associated with obesity, breast, and colon cancer. We will refill prescription Vitamin D 50,000 IU every week for 1 month with no refills and Stacey Singh will follow-up for routine testing of Vitamin D, at least 2-3 times per year to avoid over-replacement.  - Vitamin D, Ergocalciferol, (DRISDOL) 1.25 MG (50000 UNIT) CAPS capsule; Take 1 capsule (50,000 Units total) by mouth every 7 (seven) days.  Dispense: 4 capsule; Refill: 0  2. Eating disorder, unspecified type We will refill Wellbutrin 150 for 1 month with no refills. Behavior modification techniques were discussed today to help Stacey Singh deal with her emotional/non-hunger eating behaviors.  Orders and follow up as documented in patient record.   - buPROPion (WELLBUTRIN SR) 150 MG 12 hr tablet; Take 1 tablet (150 mg total) by mouth daily.  Dispense: 30 tablet; Refill: 0  3. Obesity, current BMI 39.6 Stacey Singh is currently in the action stage of change. As such, her goal is to  continue with weight loss efforts. She has agreed to the Category 2 Plan.   We reviewed labs from 08/12/2021 CMP, Lipid panel,Vitamin D, A1C, insulin and thyroid panel. Stacey Singh will continue meal planning. She will adhere more closely to the plan.  Exercise goals: No exercise has been prescribed at this time.  Behavioral modification strategies: increasing lean protein intake, decreasing simple carbohydrates, increasing vegetables, increasing water intake, decreasing eating out, no skipping meals, meal planning and cooking strategies, keeping healthy foods in the home, and planning for success.  Stacey Singh has agreed to follow-up with our clinic in 2 weeks. She was informed of the importance of frequent follow-up visits to maximize her success with intensive lifestyle modifications for her multiple health conditions.   Objective:   Blood pressure 118/79, pulse 94, temperature 98.1 F (36.7 C), height 5' 4"  (1.626 m), weight 230 lb (104.3 kg), SpO2 97 %. Body mass index is 39.48 kg/m.  General: Cooperative, alert, well developed, in no acute distress. HEENT: Conjunctivae and lids unremarkable. Cardiovascular: Regular rhythm.  Lungs: Normal work of breathing. Neurologic: No focal deficits.   Lab Results  Component Value Date   CREATININE 0.55 (L) 08/12/2021   BUN 15 08/12/2021   NA 142 08/12/2021   K 4.8 08/12/2021   CL 106 08/12/2021   CO2 21 08/12/2021   Lab Results  Component Value Date   ALT 10 08/12/2021   AST 12 08/12/2021   ALKPHOS 71 08/12/2021   BILITOT <0.2 08/12/2021   Lab Results  Component Value Date  HGBA1C 5.5 08/12/2021   HGBA1C 5.5 12/29/2017   Lab Results  Component Value Date   INSULIN 10.1 08/12/2021   Lab Results  Component Value Date   TSH 2.190 08/12/2021   Lab Results  Component Value Date   CHOL 193 08/12/2021   HDL 56 08/12/2021   LDLCALC 127 (H) 08/12/2021   TRIG 55 08/12/2021   CHOLHDL 4.1 12/29/2017   Lab Results  Component Value Date    VD25OH 36.0 08/12/2021   Lab Results  Component Value Date   WBC 10.0 05/20/2015   HGB 10.7 (L) 05/20/2015   HCT 32.0 (L) 05/20/2015   MCV 92.2 05/20/2015   PLT 179 05/20/2015   No results found for: IRON, TIBC, FERRITIN  Attestation Statements:   Reviewed by clinician on day of visit: allergies, medications, problem list, medical history, surgical history, family history, social history, and previous encounter notes.  I, Lizbeth Bark, RMA, am acting as Location manager for [employer], DO.  I have reviewed the above documentation for accuracy and completeness, and I agree with the above. Jearld Lesch, DO

## 2021-09-11 ENCOUNTER — Encounter (INDEPENDENT_AMBULATORY_CARE_PROVIDER_SITE_OTHER): Payer: Self-pay | Admitting: Bariatrics

## 2021-09-22 ENCOUNTER — Other Ambulatory Visit: Payer: Self-pay | Admitting: General Surgery

## 2021-09-22 DIAGNOSIS — D0511 Intraductal carcinoma in situ of right breast: Secondary | ICD-10-CM

## 2021-09-30 ENCOUNTER — Ambulatory Visit (INDEPENDENT_AMBULATORY_CARE_PROVIDER_SITE_OTHER): Payer: BC Managed Care – PPO | Admitting: Bariatrics

## 2021-09-30 ENCOUNTER — Encounter (INDEPENDENT_AMBULATORY_CARE_PROVIDER_SITE_OTHER): Payer: Self-pay

## 2021-10-10 ENCOUNTER — Other Ambulatory Visit: Payer: Self-pay

## 2021-10-10 ENCOUNTER — Ambulatory Visit
Admission: RE | Admit: 2021-10-10 | Discharge: 2021-10-10 | Disposition: A | Discharge status: Home / Self Care | Payer: BC Managed Care – PPO | Source: Ambulatory Visit | Attending: General Surgery | Admitting: General Surgery

## 2021-10-10 DIAGNOSIS — D0511 Intraductal carcinoma in situ of right breast: Secondary | ICD-10-CM

## 2021-10-10 MED ORDER — GADOBUTROL 1 MMOL/ML IV SOLN
10.0000 mL | Freq: Once | INTRAVENOUS | Status: AC | PRN
  Administered 2021-10-10: 10 mL via INTRAVENOUS

## 2021-10-13 ENCOUNTER — Other Ambulatory Visit: Payer: Self-pay

## 2021-10-13 ENCOUNTER — Ambulatory Visit (INDEPENDENT_AMBULATORY_CARE_PROVIDER_SITE_OTHER): Payer: BC Managed Care – PPO | Admitting: Bariatrics

## 2021-10-13 VITALS — BP 121/82 | HR 84 | Temp 97.8°F | Ht 64.0 in | Wt 232.0 lb

## 2021-10-13 DIAGNOSIS — E669 Obesity, unspecified: Secondary | ICD-10-CM

## 2021-10-13 DIAGNOSIS — F509 Eating disorder, unspecified: Secondary | ICD-10-CM | POA: Diagnosis not present

## 2021-10-13 DIAGNOSIS — Z6839 Body mass index (BMI) 39.0-39.9, adult: Secondary | ICD-10-CM

## 2021-10-13 DIAGNOSIS — E559 Vitamin D deficiency, unspecified: Secondary | ICD-10-CM | POA: Diagnosis not present

## 2021-10-13 MED ORDER — VITAMIN D (ERGOCALCIFEROL) 1.25 MG (50000 UNIT) PO CAPS: 50000.0000 [IU] | ORAL_CAPSULE | ORAL | 0 refills | Status: DC

## 2021-10-13 MED ORDER — BUPROPION HCL ER (SR) 150 MG PO TB12: 150.0000 mg | ORAL_TABLET | Freq: Every day | ORAL | 0 refills | Status: DC

## 2021-10-15 ENCOUNTER — Encounter (INDEPENDENT_AMBULATORY_CARE_PROVIDER_SITE_OTHER): Payer: Self-pay | Admitting: Bariatrics

## 2021-10-15 NOTE — Progress Notes (Signed)
? ? ? ?Chief Complaint:  ? ?OBESITY ?Stacey Singh is here to discuss her progress with her obesity treatment plan along with follow-up of her obesity related diagnoses. Stacey Singh is on the Category 2 Plan and states she is following her eating plan approximately 75% of the time. Stacey Singh states she is walking for 15 minutes 3 times per week. ? ?Today's visit was #: 4 ?Starting weight: 236 lbs ?Starting date: 08/12/2021 ?Today's weight: 232 lbs ?Today's date: 10/13/2021 ?Total lbs lost to date: 4 lbs ?Total lbs lost since last in-office visit: 0 ? ?Interim History: Stacey Singh is up 2 lbs since her last visit. Her muscle mass is up about 1 lb and body fat is up 0.1% per bioimpedance scale. ? ?Subjective:  ? ?1. Vitamin D insufficiency ?Stacey Singh is taking her Vitamin D as directed.  ? ?2. Eating disorder, unspecified type ?Stacey Singh states her cravings are lower. She is taking her medications as directed.  ? ?Assessment/Plan:  ? ?1. Vitamin D insufficiency ?Low Vitamin D level contributes to fatigue and are associated with obesity, breast, and colon cancer. We will refill prescription Vitamin D 50,000 IU every week for 1 month with no refills and Stacey Singh will follow-up for routine testing of Vitamin D, at least 2-3 times per year to avoid over-replacement. ? ?- Vitamin D, Ergocalciferol, (DRISDOL) 1.25 MG (50000 UNIT) CAPS capsule; Take 1 capsule (50,000 Units total) by mouth every 7 (seven) days.  Dispense: 4 capsule; Refill: 0 ? ?2. Eating disorder, unspecified type ?We will refill Wellbutrin SR 150 mg for 1 month with no refills. Behavior modification techniques were discussed today to help Stacey Singh deal with her emotional/non-hunger eating behaviors.  Orders and follow up as documented in patient record.  ? ?- buPROPion (WELLBUTRIN SR) 150 MG 12 hr tablet; Take 1 tablet (150 mg total) by mouth daily.  Dispense: 30 tablet; Refill: 0 ? ?3. Obesity, current BMI 39.9 ?Stacey Singh is currently in the action stage of change. As such, her goal is to continue  with weight loss efforts. She has agreed to keeping a food journal and adhering to recommended goals of 400-500 calories and 35 plus grams of protein at supper.  ? ?Stacey Singh will continue meal planning and she will continue intentional eating. She will continue to increase her water intake.  ? ?Exercise goals:  As is. ? ?Behavioral modification strategies: increasing lean protein intake, decreasing simple carbohydrates, increasing vegetables, increasing water intake, decreasing eating out, no skipping meals, meal planning and cooking strategies, keeping healthy foods in the home, and planning for success. ? ?Stacey Singh has agreed to follow-up with our clinic in 2 weeks with Jake Bathe, FNP or Abby Potash, PA-c. She was informed of the importance of frequent follow-up visits to maximize her success with intensive lifestyle modifications for her multiple health conditions.  ? ?Objective:  ? ?Blood pressure 121/82, pulse 84, temperature 97.8 ?F (36.6 ?C), height 5' 4"  (1.626 m), weight 232 lb (105.2 kg), SpO2 97 %. ?Body mass index is 39.82 kg/m?. ? ?General: Cooperative, alert, well developed, in no acute distress. ?HEENT: Conjunctivae and lids unremarkable. ?Cardiovascular: Regular rhythm.  ?Lungs: Normal work of breathing. ?Neurologic: No focal deficits.  ? ?Lab Results  ?Component Value Date  ? CREATININE 0.55 (L) 08/12/2021  ? BUN 15 08/12/2021  ? NA 142 08/12/2021  ? K 4.8 08/12/2021  ? CL 106 08/12/2021  ? CO2 21 08/12/2021  ? ?Lab Results  ?Component Value Date  ? ALT 10 08/12/2021  ? AST 12 08/12/2021  ?  ALKPHOS 71 08/12/2021  ? BILITOT <0.2 08/12/2021  ? ?Lab Results  ?Component Value Date  ? HGBA1C 5.5 08/12/2021  ? HGBA1C 5.5 12/29/2017  ? ?Lab Results  ?Component Value Date  ? INSULIN 10.1 08/12/2021  ? ?Lab Results  ?Component Value Date  ? TSH 2.190 08/12/2021  ? ?Lab Results  ?Component Value Date  ? CHOL 193 08/12/2021  ? HDL 56 08/12/2021  ? LDLCALC 127 (H) 08/12/2021  ? TRIG 55 08/12/2021  ? CHOLHDL  4.1 12/29/2017  ? ?Lab Results  ?Component Value Date  ? VD25OH 36.0 08/12/2021  ? ?Lab Results  ?Component Value Date  ? WBC 10.0 05/20/2015  ? HGB 10.7 (L) 05/20/2015  ? HCT 32.0 (L) 05/20/2015  ? MCV 92.2 05/20/2015  ? PLT 179 05/20/2015  ? ?No results found for: IRON, TIBC, FERRITIN ? ?Attestation Statements:  ? ?Reviewed by clinician on day of visit: allergies, medications, problem list, medical history, surgical history, family history, social history, and previous encounter notes. ? ?I, Lizbeth Bark, RMA, am acting as transcriptionist for [employer], DO. ? ?I have reviewed the above documentation for accuracy and completeness, and I agree with the above. Jearld Lesch, DO ? ?

## 2021-10-27 ENCOUNTER — Encounter (INDEPENDENT_AMBULATORY_CARE_PROVIDER_SITE_OTHER): Payer: Self-pay | Admitting: Family Medicine

## 2021-10-27 ENCOUNTER — Telehealth (INDEPENDENT_AMBULATORY_CARE_PROVIDER_SITE_OTHER): Payer: BC Managed Care – PPO | Admitting: Family Medicine

## 2021-10-27 DIAGNOSIS — F509 Eating disorder, unspecified: Secondary | ICD-10-CM | POA: Diagnosis not present

## 2021-10-27 DIAGNOSIS — E88819 Insulin resistance, unspecified: Secondary | ICD-10-CM | POA: Insufficient documentation

## 2021-10-27 DIAGNOSIS — Z6839 Body mass index (BMI) 39.0-39.9, adult: Secondary | ICD-10-CM | POA: Diagnosis not present

## 2021-10-27 DIAGNOSIS — E8881 Metabolic syndrome: Secondary | ICD-10-CM

## 2021-10-27 DIAGNOSIS — E669 Obesity, unspecified: Secondary | ICD-10-CM | POA: Diagnosis not present

## 2021-10-27 MED ORDER — WEGOVY 0.25 MG/0.5ML ~~LOC~~ SOAJ: 0.2500 mg | SUBCUTANEOUS | 0 refills | Status: DC

## 2021-10-27 NOTE — Progress Notes (Signed)
?TeleHealth Visit:  ?Due to the COVID-19 pandemic, this visit was completed with telemedicine (audio/video) technology to reduce patient and provider exposure as well as to preserve personal protective equipment.  ? ?Stacey Singh has verbally consented to this TeleHealth visit. The patient is located at home, the provider is located at home. The participants in this visit include the listed provider and patient. The visit was conducted today via MyChart video. ? ?OBESITY ?Stacey Singh is here to discuss her progress with her obesity treatment plan along with follow-up of her obesity related diagnoses.  ? ?Today's visit was # 5 ?Starting weight: 236 lbs ?Starting date: 08/12/2021 ?Total weight loss: 4 lbs at last in office visit. ?Weight at last in office visit: 232 lbs ?Today's reported weight:  No weight reported. ? ?  ?Nutrition Plan: the Category 2 Plan and keeping a food journal and adhering to recommended goals of 400-500 calories and 35 protein at supper. ?Hunger is poorly controlled. Cravings are poorly controlled.  ? ?Interim History: Tom is struggling with adhering to plan at dinner.  She generally has a protein shake at breakfast and then a sandwich at lunch.  Dinner can often be take-out because they are so busy with jobs and their kids sporting practices and events.  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  On weekends she does have her shake for breakfast but then is usually off plan lunch and dinner.  She feels she does try to make good choices at the restaurants.  For example she will get a salad with shrimp on top but then she uses the dressing provided by the restaurant.  ?Her weight loss efforts are complicated by the fact that her school-age son is struggling with some body image issues and has had recent issues with food restriction. ? ?Assessment/Plan:  ?Insulin Resistance ?Stacey Singh has had elevated fasting insulin readings. Goal is HgbA1c < 5.7, fasting insulin closer to 5.   ?She reports polyphagia.   ?Medication(s): none ?Lab Results  ?Component Value Date  ? HGBA1C 5.5 08/12/2021  ? ?Lab Results  ?Component Value Date  ? INSULIN 10.1 08/12/2021  ? ? ?Plan ?Stacey Singh feels she would like to try a medication for appetite.  We discussed NTZGYF and she is open to trying this.  She denies history of pancreatitis or personal/family history of thyroid cancer.  She does have cholelithiasis currently as evidenced by recent MRI.  She is asymptomatic.  I advised this is not a contraindication but Wegovy could potentially worsen this. ?New prescription: Wegovy 0.25 mg weekly ? ?Eating disorder/emotional eating ?Stacey Singh reports some issues with food cravings, especially in the evening.  She is still dealing with this despite being on bupropion 150 mg daily. ? ?Plan: ?Continue bupropion 150 milligrams daily. ?Hopefully Mancel Parsons will help with these cravings. ? ?Obesity: Current BMI 39.8 ?Stacey Singh is currently in the action stage of change. As such, her goal is to continue with weight loss efforts. She has agreed to the Category 2 Plan and keeping a food journal and adhering to recommended goals of 400-500 calories and 35+ gms of protein at supper. ? ?Exercise goals: Not discussed. ? ?Behavioral modification strategies: decreasing simple carbohydrates and meal planning and cooking strategies. ?We discussed possibly using a crockpot for some meals. ?I sent the new eating out guide via MyChart. ?I suggested finding a low calorie dressing that she likes to use on salads when getting takeout. ? ?Stacey Singh has agreed to follow-up with our clinic in 2 weeks.  ? ?No orders of the  defined types were placed in this encounter. ? ? ?There are no discontinued medications.  ? ?Meds ordered this encounter  ?Medications  ? Semaglutide-Weight Management (WEGOVY) 0.25 MG/0.5ML SOAJ  ?  Sig: Inject 0.25 mg into the skin once a week.  ?  Dispense:  2 mL  ?  Refill:  0  ?  Order Specific Question:   Supervising Provider  ?  Answer:   Dennard Nip D [[id number]]   ?   ? ?Objective:  ? ?VITALS: Per patient if applicable, see vitals. ?GENERAL: Alert and in no acute distress. ?CARDIOPULMONARY: No increased WOB. Speaking in clear sentences.  ?PSYCH: Pleasant and cooperative. Speech normal rate and rhythm. Affect is appropriate. Insight and judgement are appropriate. Attention is focused, linear, and appropriate.  ?NEURO: Oriented as arrived to appointment on time with no prompting.  ? ?Lab Results  ?Component Value Date  ? CREATININE 0.55 (L) 08/12/2021  ? BUN 15 08/12/2021  ? NA 142 08/12/2021  ? K 4.8 08/12/2021  ? CL 106 08/12/2021  ? CO2 21 08/12/2021  ? ?Lab Results  ?Component Value Date  ? ALT 10 08/12/2021  ? AST 12 08/12/2021  ? ALKPHOS 71 08/12/2021  ? BILITOT <0.2 08/12/2021  ? ?Lab Results  ?Component Value Date  ? HGBA1C 5.5 08/12/2021  ? HGBA1C 5.5 12/29/2017  ? ?Lab Results  ?Component Value Date  ? INSULIN 10.1 08/12/2021  ? ?Lab Results  ?Component Value Date  ? TSH 2.190 08/12/2021  ? ?Lab Results  ?Component Value Date  ? CHOL 193 08/12/2021  ? HDL 56 08/12/2021  ? LDLCALC 127 (H) 08/12/2021  ? TRIG 55 08/12/2021  ? CHOLHDL 4.1 12/29/2017  ? ?Lab Results  ?Component Value Date  ? WBC 10.0 05/20/2015  ? HGB 10.7 (L) 05/20/2015  ? HCT 32.0 (L) 05/20/2015  ? MCV 92.2 05/20/2015  ? PLT 179 05/20/2015  ? ?No results found for: IRON, TIBC, FERRITIN ?Lab Results  ?Component Value Date  ? VD25OH 36.0 08/12/2021  ? ? ?Attestation Statements:  ? ?Reviewed by clinician on day of visit: allergies, medications, problem list, medical history, surgical history, family history, social history, and previous encounter notes. ? ? ?

## 2021-10-29 ENCOUNTER — Encounter (INDEPENDENT_AMBULATORY_CARE_PROVIDER_SITE_OTHER): Payer: Self-pay

## 2021-10-29 ENCOUNTER — Telehealth (INDEPENDENT_AMBULATORY_CARE_PROVIDER_SITE_OTHER): Payer: Self-pay | Admitting: Family Medicine

## 2021-10-29 NOTE — Telephone Encounter (Signed)
Prior authorization approved for [facility]. Effective: 10/27/2021 - 05/29/2022. Patient sent approval message via mychart.  ?

## 2021-11-10 ENCOUNTER — Ambulatory Visit (INDEPENDENT_AMBULATORY_CARE_PROVIDER_SITE_OTHER): Payer: BC Managed Care – PPO | Admitting: Bariatrics

## 2021-11-10 ENCOUNTER — Encounter (INDEPENDENT_AMBULATORY_CARE_PROVIDER_SITE_OTHER): Payer: Self-pay | Admitting: Bariatrics

## 2021-11-10 VITALS — BP 122/80 | HR 85 | Temp 98.3°F | Ht 64.0 in | Wt 231.0 lb

## 2021-11-10 DIAGNOSIS — E559 Vitamin D deficiency, unspecified: Secondary | ICD-10-CM | POA: Diagnosis not present

## 2021-11-10 DIAGNOSIS — Z6839 Body mass index (BMI) 39.0-39.9, adult: Secondary | ICD-10-CM | POA: Diagnosis not present

## 2021-11-10 DIAGNOSIS — E88819 Insulin resistance, unspecified: Secondary | ICD-10-CM

## 2021-11-10 DIAGNOSIS — Z6841 Body Mass Index (BMI) 40.0 and over, adult: Secondary | ICD-10-CM

## 2021-11-10 DIAGNOSIS — E8881 Metabolic syndrome: Secondary | ICD-10-CM

## 2021-11-10 DIAGNOSIS — E669 Obesity, unspecified: Secondary | ICD-10-CM | POA: Diagnosis not present

## 2021-11-10 MED ORDER — VITAMIN D (ERGOCALCIFEROL) 1.25 MG (50000 UNIT) PO CAPS: 50000.0000 [IU] | ORAL_CAPSULE | ORAL | 0 refills | Status: DC

## 2021-11-10 MED ORDER — WEGOVY 0.5 MG/0.5ML ~~LOC~~ SOAJ: 0.5000 mg | SUBCUTANEOUS | 0 refills | Status: DC

## 2021-11-20 ENCOUNTER — Encounter (INDEPENDENT_AMBULATORY_CARE_PROVIDER_SITE_OTHER): Payer: Self-pay | Admitting: Bariatrics

## 2021-11-20 NOTE — Progress Notes (Signed)
? ? ? ?Chief Complaint:  ? ?OBESITY ?Stacey Singh is here to discuss her progress with her obesity treatment plan along with follow-up of her obesity related diagnoses. Stacey Singh is on the Category 2 Plan and states she is following her eating plan approximately 85% of the time. Stacey Singh states she is walking for 15 minutes 3 times per week. ? ?Today's visit was #: 6 ?Starting weight: 236 lbs ?Starting date: 08/12/2021 ?Today's weight: 231 lbs ?Today's date: 11/10/2021 ?Total lbs lost to date: 5 lbs ?Total lbs lost since last in-office visit: 1 lb ? ?Interim History: Stacey Singh is down 1 lb. She had Spring break last week. She is doing okay with water and protein.  ? ?Subjective:  ? ?1. Vitamin D insufficiency ?Stacey Singh is taking Vitamin D currently.  ? ?2. Insulin resistance ?Stacey Singh is currently taking Wegovy. She notes no side effects.  ? ?Assessment/Plan:  ? ?1. Vitamin D insufficiency ?Low Vitamin D level contributes to fatigue and are associated with obesity, breast, and colon cancer. We will refill prescription Vitamin D 50,000 IU every week for 1 month with no refills and Stacey Singh will follow-up for routine testing of Vitamin D, at least 2-3 times per year to avoid over-replacement. ? ?- Vitamin D, Ergocalciferol, (DRISDOL) 1.25 MG (50000 UNIT) CAPS capsule; Take 1 capsule (50,000 Units total) by mouth every 7 (seven) days.  Dispense: 4 capsule; Refill: 0 ? ?2. Insulin resistance ?Stacey Singh will continue to work on weight loss, exercise, and decreasing simple carbohydrates to help decrease the risk of diabetes. We will refill Wegovy 0.5 mg for 1 month with no refills. Stacey Singh agreed to follow-up with [place] as directed to closely monitor her progress. ? ?- Semaglutide-Weight Management (WEGOVY) 0.5 MG/0.5ML SOAJ; Inject 0.5 mg into the skin once a week.  Dispense: 2 mL; Refill: 0 ? ?3. Obesity, current BMI 39.7 ?Stacey Singh is currently in the action stage of change. As such, her goal is to continue with weight loss efforts. She has agreed to the  Category 2 Plan.  ? ?Stacey Singh will continue meal planning and she will continue intentional eating.  ? ?Exercise goals:  As is.  ? ?Behavioral modification strategies: increasing lean protein intake, decreasing simple carbohydrates, increasing vegetables, increasing water intake, decreasing eating out, no skipping meals, meal planning and cooking strategies, keeping healthy foods in the home, and planning for success. ? ?Stacey Singh has agreed to follow-up with our clinic in 3 weeks with Stacey Singh, Stacey Singh. She was informed of the importance of frequent follow-up visits to maximize her success with intensive lifestyle modifications for her multiple health conditions.  ? ?Objective:  ? ?Blood pressure 122/80, pulse 85, temperature 98.3 ?F (36.8 ?C), height 5' 4"  (1.626 m), weight 231 lb (104.8 kg), SpO2 98 %. ?Body mass index is 39.65 kg/m?. ? ?General: Cooperative, alert, well developed, in no acute distress. ?HEENT: Conjunctivae and lids unremarkable. ?Cardiovascular: Regular rhythm.  ?Lungs: Normal work of breathing. ?Neurologic: No focal deficits.  ? ?Lab Results  ?Component Value Date  ? CREATININE 0.55 (L) 08/12/2021  ? BUN 15 08/12/2021  ? NA 142 08/12/2021  ? K 4.8 08/12/2021  ? CL 106 08/12/2021  ? CO2 21 08/12/2021  ? ?Lab Results  ?Component Value Date  ? ALT 10 08/12/2021  ? AST 12 08/12/2021  ? ALKPHOS 71 08/12/2021  ? BILITOT <0.2 08/12/2021  ? ?Lab Results  ?Component Value Date  ? HGBA1C 5.5 08/12/2021  ? HGBA1C 5.5 12/29/2017  ? ?Lab Results  ?Component Value Date  ? INSULIN  10.1 08/12/2021  ? ?Lab Results  ?Component Value Date  ? TSH 2.190 08/12/2021  ? ?Lab Results  ?Component Value Date  ? CHOL 193 08/12/2021  ? HDL 56 08/12/2021  ? LDLCALC 127 (H) 08/12/2021  ? TRIG 55 08/12/2021  ? CHOLHDL 4.1 12/29/2017  ? ?Lab Results  ?Component Value Date  ? VD25OH 36.0 08/12/2021  ? ?Lab Results  ?Component Value Date  ? WBC 10.0 05/20/2015  ? HGB 10.7 (L) 05/20/2015  ? HCT 32.0 (L) 05/20/2015  ? MCV 92.2  05/20/2015  ? PLT 179 05/20/2015  ? ?No results found for: IRON, TIBC, FERRITIN ? ?Attestation Statements:  ? ?Reviewed by clinician on day of visit: allergies, medications, problem list, medical history, surgical history, family history, social history, and previous encounter notes. ? ?I, Lizbeth Bark, RMA, am acting as transcriptionist for [employer], DO. ? ?I have reviewed the above documentation for accuracy and completeness, and I agree with the above. Jearld Lesch, DO ? ?

## 2021-11-20 NOTE — Progress Notes (Signed)
?TeleHealth Visit:  ?This visit was completed with telemedicine (audio/video) technology. ?[name] has verbally consented to this TeleHealth visit. The patient is located at home, the provider is located at home. The participants in this visit include the listed provider and patient. The visit was conducted today via MyChart video. ? ?OBESITY ?Stacey Singh is here to discuss her progress with her obesity treatment plan along with follow-up of her obesity related diagnoses.  ? ?Today's visit was # 7 ?Starting weight: 236 lbs ?Starting date: 1/1//23 ?Today's reported weight:  No weight reported. ? ? ?Nutrition Plan: Category 2 Plan and keeping a food journal and adhering to recommended goals of 400-500 calories and 35+ gms of protein at supper.Marland Kitchen  ?Hunger is well controlled. Cravings are well controlled.  ?Current exercise: walking quite a bit at work ? ?Interim History: Stacey Singh reports that the [facility] seems to be helping with her appetite.  She reports she is very busy at work but she is getting in all of her protein.  She does reports she seems to eat several smaller portions throughout the day.  Water intake is good.  She is also getting in a lot of steps at work. ? ?Assessment/Plan:  ?Insulin Resistance ?[name] has had elevated fasting insulin readings. Goal is HgbA1c < 5.7, fasting insulin closer to 5.   ?She denies polyphagia. Notes nausea.  ?Medication(s): Wegovy 0.25 mg weekly. She has not started the 0.50 mg dose yet. ?Lab Results  ?Component Value Date  ? HGBA1C 5.5 08/12/2021  ? ?Lab Results  ?Component Value Date  ? INSULIN 10.1 08/12/2021  ? ? ?Plan ?Continue [id number]. Increase to 0.5 mg when finished with 0.25 mg injection box. ? ?2.  Nausea ?She has nausea for a few days after taking her Wegovy injection.  She notes this is worse when her stomach is empty. ? ?Plan: ?New prescription: Zofran 8 mg every 8 hours as needed for nausea. ?Eat regularly.  ? ?3.  Obesity: Current BMI 39.7 ?Stacey Singh is currently in the  action stage of change. As such, her goal is to continue with weight loss efforts.  ?She has agreed to Category 2 Plan and keeping a food journal and adhering to recommended goals of 400-500 calories and 35+ gms of protein at supper.. .  ? ?Exercise goals: She will try to fit in brisk walks while she is attending her children sporting events. ? ?Behavioral modification strategies: increasing lean protein intake. ? ?[name] has agreed to follow-up with our clinic in 4 weeks.  ? ?No orders of the defined types were placed in this encounter. ? ? ?There are no discontinued medications.  ? ?Meds ordered this encounter  ?Medications  ? ondansetron (ZOFRAN) 8 MG tablet  ?  Sig: Take 1 tablet (8 mg total) by mouth every 8 (eight) hours as needed for nausea or vomiting.  ?  Dispense:  20 tablet  ?  Refill:  0  ?  Order Specific Question:   Supervising Provider  ?  Answer:   Dennard Nip D [[id number]]  ?   ? ?Objective:  ? ?VITALS: Per patient if applicable, see vitals. ?GENERAL: Alert and in no acute distress. ?CARDIOPULMONARY: No increased WOB. Speaking in clear sentences.  ?PSYCH: Pleasant and cooperative. Speech normal rate and rhythm. Affect is appropriate. Insight and judgement are appropriate. Attention is focused, linear, and appropriate.  ?NEURO: Oriented as arrived to appointment on time with no prompting.  ? ?Lab Results  ?Component Value Date  ? CREATININE 0.55 (L) 08/12/2021  ?  BUN 15 08/12/2021  ? NA 142 08/12/2021  ? K 4.8 08/12/2021  ? CL 106 08/12/2021  ? CO2 21 08/12/2021  ? ?Lab Results  ?Component Value Date  ? ALT 10 08/12/2021  ? AST 12 08/12/2021  ? ALKPHOS 71 08/12/2021  ? BILITOT <0.2 08/12/2021  ? ?Lab Results  ?Component Value Date  ? HGBA1C 5.5 08/12/2021  ? HGBA1C 5.5 12/29/2017  ? ?Lab Results  ?Component Value Date  ? INSULIN 10.1 08/12/2021  ? ?Lab Results  ?Component Value Date  ? TSH 2.190 08/12/2021  ? ?Lab Results  ?Component Value Date  ? CHOL 193 08/12/2021  ? HDL 56 08/12/2021  ? LDLCALC  127 (H) 08/12/2021  ? TRIG 55 08/12/2021  ? CHOLHDL 4.1 12/29/2017  ? ?Lab Results  ?Component Value Date  ? WBC 10.0 05/20/2015  ? HGB 10.7 (L) 05/20/2015  ? HCT 32.0 (L) 05/20/2015  ? MCV 92.2 05/20/2015  ? PLT 179 05/20/2015  ? ?No results found for: IRON, TIBC, FERRITIN ?Lab Results  ?Component Value Date  ? VD25OH 36.0 08/12/2021  ? ? ?Attestation Statements:  ? ?Reviewed by clinician on day of visit: allergies, medications, problem list, medical history, surgical history, family history, social history, and previous encounter notes. ? ? ? ?

## 2021-11-24 ENCOUNTER — Telehealth (INDEPENDENT_AMBULATORY_CARE_PROVIDER_SITE_OTHER): Payer: BC Managed Care – PPO | Admitting: Family Medicine

## 2021-11-24 ENCOUNTER — Encounter (INDEPENDENT_AMBULATORY_CARE_PROVIDER_SITE_OTHER): Payer: Self-pay | Admitting: Family Medicine

## 2021-11-24 DIAGNOSIS — E8881 Metabolic syndrome: Secondary | ICD-10-CM

## 2021-11-24 DIAGNOSIS — R110 Nausea: Secondary | ICD-10-CM | POA: Diagnosis not present

## 2021-11-24 DIAGNOSIS — E66813 Obesity, class 3: Secondary | ICD-10-CM

## 2021-11-24 DIAGNOSIS — Z6841 Body Mass Index (BMI) 40.0 and over, adult: Secondary | ICD-10-CM

## 2021-11-24 DIAGNOSIS — E88819 Insulin resistance, unspecified: Secondary | ICD-10-CM

## 2021-11-24 MED ORDER — ONDANSETRON HCL 8 MG PO TABS: 8.0000 mg | ORAL_TABLET | Freq: Three times a day (TID) | ORAL | 0 refills | Status: DC | PRN

## 2021-12-05 ENCOUNTER — Ambulatory Visit: Payer: BC Managed Care – PPO | Admitting: Plastic Surgery

## 2021-12-05 ENCOUNTER — Ambulatory Visit (INDEPENDENT_AMBULATORY_CARE_PROVIDER_SITE_OTHER): Payer: BC Managed Care – PPO | Admitting: Plastic Surgery

## 2021-12-05 ENCOUNTER — Encounter: Payer: Self-pay | Admitting: Plastic Surgery

## 2021-12-05 DIAGNOSIS — N644 Mastodynia: Secondary | ICD-10-CM

## 2021-12-05 DIAGNOSIS — Z853 Personal history of malignant neoplasm of breast: Secondary | ICD-10-CM | POA: Diagnosis not present

## 2021-12-05 DIAGNOSIS — Z9889 Other specified postprocedural states: Secondary | ICD-10-CM

## 2021-12-05 MED ORDER — KETOROLAC TROMETHAMINE 10 MG PO TABS: 10.0000 mg | ORAL_TABLET | Freq: Two times a day (BID) | ORAL | 0 refills | Status: DC | PRN

## 2021-12-05 NOTE — Progress Notes (Addendum)
   Subjective:    Patient ID: Stacey Singh, female    DOB: 1975-12-23, 46 y.o.   MRN: [id number]  The patient is a 46 year old female here for evaluation of her right breast.  She was originally seen for right breast cancer and underwent bilateral mastectomies with reconstruction with expanders followed by implants.  The expanders were placed in August 2021 and she underwent the expanders in November 2021.  She has 535 cc implants in place.  In March 2022 she had excision of some excess tissue with lipofilling.  Since then she has been concerned about some tightness and discomfort in the right breast.  She had an ultrasound and a mammogram which were negative she also did PT which she felt was very beneficial overall but did not help with the discomfort.  It seems to be with certain positions or movements that it is worse.  It does not really seem to be reproducible here in the office.  There are no areas of concern as far as lumps, bumps or anything visual.     Review of Systems  Constitutional: Negative.   HENT: Negative.    Eyes: Negative.   Respiratory:  Negative for shortness of breath.   Cardiovascular: Negative.   Gastrointestinal: Negative.   Endocrine: Negative.   Genitourinary: Negative.   Musculoskeletal: Negative.   Skin:  Negative for color change and wound.  Hematological: Negative.   Psychiatric/Behavioral: Negative.        Objective:   Physical Exam Constitutional:      Appearance: Normal appearance.  HENT:     Head: Normocephalic and atraumatic.  Cardiovascular:     Rate and Rhythm: Normal rate.     Pulses: Normal pulses.  Pulmonary:     Effort: Pulmonary effort is normal.  Abdominal:     Palpations: Abdomen is soft.  Skin:    General: Skin is warm.     Capillary Refill: Capillary refill takes less than 2 seconds.  Neurological:     Mental Status: She is alert.       Assessment & Plan:     ICD-10-CM   1. S/P breast reconstruction, bilateral  Z98.890        We discussed options for next steps.  I think it is reasonable to try Toradol and see if that helps at all.  That may indicate whether or not a regional block might be helpful to break the cycle.  I do not see anything that is concerning or that we can change at this time.  Pictures were obtained of the patient and placed in the chart with the patient's or guardian's permission.

## 2021-12-13 ENCOUNTER — Other Ambulatory Visit (INDEPENDENT_AMBULATORY_CARE_PROVIDER_SITE_OTHER): Payer: Self-pay | Admitting: Bariatrics

## 2021-12-13 DIAGNOSIS — E559 Vitamin D deficiency, unspecified: Secondary | ICD-10-CM

## 2021-12-16 ENCOUNTER — Encounter (INDEPENDENT_AMBULATORY_CARE_PROVIDER_SITE_OTHER): Payer: Self-pay | Admitting: Family Medicine

## 2021-12-16 ENCOUNTER — Ambulatory Visit (INDEPENDENT_AMBULATORY_CARE_PROVIDER_SITE_OTHER): Payer: BC Managed Care – PPO | Admitting: Family Medicine

## 2021-12-16 ENCOUNTER — Ambulatory Visit (INDEPENDENT_AMBULATORY_CARE_PROVIDER_SITE_OTHER): Payer: BC Managed Care – PPO | Admitting: Bariatrics

## 2021-12-16 VITALS — BP 114/79 | HR 79 | Temp 98.2°F | Ht 64.0 in | Wt 226.0 lb

## 2021-12-16 DIAGNOSIS — Z6838 Body mass index (BMI) 38.0-38.9, adult: Secondary | ICD-10-CM

## 2021-12-16 DIAGNOSIS — E669 Obesity, unspecified: Secondary | ICD-10-CM

## 2021-12-16 DIAGNOSIS — F3289 Other specified depressive episodes: Secondary | ICD-10-CM | POA: Diagnosis not present

## 2021-12-16 DIAGNOSIS — F509 Eating disorder, unspecified: Secondary | ICD-10-CM | POA: Diagnosis not present

## 2021-12-16 DIAGNOSIS — E559 Vitamin D deficiency, unspecified: Secondary | ICD-10-CM | POA: Diagnosis not present

## 2021-12-16 DIAGNOSIS — E8881 Metabolic syndrome: Secondary | ICD-10-CM

## 2021-12-17 ENCOUNTER — Other Ambulatory Visit: Payer: Self-pay | Admitting: Physician Assistant

## 2021-12-17 DIAGNOSIS — K8020 Calculus of gallbladder without cholecystitis without obstruction: Secondary | ICD-10-CM

## 2021-12-23 ENCOUNTER — Encounter (INDEPENDENT_AMBULATORY_CARE_PROVIDER_SITE_OTHER): Payer: Self-pay | Admitting: Family Medicine

## 2021-12-23 ENCOUNTER — Encounter (INDEPENDENT_AMBULATORY_CARE_PROVIDER_SITE_OTHER): Payer: Self-pay | Admitting: Bariatrics

## 2021-12-23 ENCOUNTER — Telehealth: Payer: BC Managed Care – PPO | Admitting: Plastic Surgery

## 2021-12-23 MED ORDER — VITAMIN D (ERGOCALCIFEROL) 1.25 MG (50000 UNIT) PO CAPS: 50000.0000 [IU] | ORAL_CAPSULE | ORAL | 0 refills | Status: DC

## 2021-12-23 MED ORDER — WEGOVY 0.5 MG/0.5ML ~~LOC~~ SOAJ: 0.5000 mg | SUBCUTANEOUS | 0 refills | Status: DC

## 2021-12-23 MED ORDER — BUPROPION HCL ER (SR) 150 MG PO TB12: 150.0000 mg | ORAL_TABLET | Freq: Every day | ORAL | 0 refills | Status: DC

## 2021-12-23 NOTE — Telephone Encounter (Signed)
Last OV with Dr Jearld Shines

## 2021-12-24 DIAGNOSIS — F32A Depression, unspecified: Secondary | ICD-10-CM | POA: Insufficient documentation

## 2021-12-24 DIAGNOSIS — E559 Vitamin D deficiency, unspecified: Secondary | ICD-10-CM | POA: Insufficient documentation

## 2021-12-24 NOTE — Telephone Encounter (Signed)
Dr.Ukleja

## 2021-12-24 NOTE — Progress Notes (Signed)
Chief Complaint:   OBESITY Stacey Singh is here to discuss her progress with her obesity treatment plan along with follow-up of her obesity related diagnoses. Stacey Singh is on the Category 2 Plan and states she is following her eating plan approximately 85% of the time. Stacey Singh states she is walking 3 times per week.  Today's visit was #: 8 Starting weight: 236 lbs Starting date: 08/12/2021 Today's weight: 226 lbs Today's date: 12/16/2021 Total lbs lost to date: 10 lbs Total lbs lost since last in-office visit: 5 lbs  Interim History: Stacey Singh has been trying to navigate through constipation from [place].  She is taking a stool softener every other day.  She is able to get all food in comfortably.  Last RMR at initial appointment was 1886.  She does not voice any reason to change meal plans.  Subjective:   1. Vitamin D deficiency Stacey Singh is on prescription Vitamin D.  She denies any nausea, vomiting, or muscle weakness.  Last vitamin D level was 36.0.  2. Other depression, with emotional eating Stacey Singh does well on Wellbutrin in terms of cravings control.  Assessment/Plan:   1. Vitamin D deficiency Refill Vitamin D, 50,000 IU weekly #4 no refills, see below.   - Vitamin D, Ergocalciferol, (DRISDOL) 1.25 MG (50000 UNIT) CAPS capsule; Take 1 capsule (50,000 Units total) by mouth every 7 (seven) days.  Dispense: 4 capsule; Refill: 0  2. Other depression, with emotional eating Refill Wellbutrin 150 mg daily, see below.   - buPROPion (WELLBUTRIN SR) 150 MG 12 hr tablet; Take 1 tablet (150 mg total) by mouth daily.  Dispense: 30 tablet; Refill: 0  3. Obesity, current BMI 38.8 Refill Wegovy 0.5 mg SQ every week #2 mL no refills, see below.   - Semaglutide-Weight Management (WEGOVY) 0.5 MG/0.5ML SOAJ; Inject 0.5 mg into the skin once a week.  Dispense: 2 mL; Refill: 0  Stacey Singh is currently in the action stage of change. As such, her goal is to continue with weight loss efforts. She has agreed to the  Category 2 Plan.   Exercise goals: All adults should avoid inactivity. Some physical activity is better than none, and adults who participate in any amount of physical activity gain some health benefits.  Behavioral modification strategies: increasing lean protein intake, no skipping meals, meal planning and cooking strategies, keeping healthy foods in the home, and planning for success.  Stacey Singh has agreed to follow-up with our clinic in 3 weeks. She was informed of the importance of frequent follow-up visits to maximize her success with intensive lifestyle modifications for her multiple health conditions.   Objective:   Blood pressure 114/79, pulse 79, temperature 98.2 F (36.8 C), height 5' 4"  (1.626 m), weight 226 lb (102.5 kg), SpO2 100 %. Body mass index is 38.79 kg/m.  General: Cooperative, alert, well developed, in no acute distress. HEENT: Conjunctivae and lids unremarkable. Cardiovascular: Regular rhythm.  Lungs: Normal work of breathing. Neurologic: No focal deficits.   Lab Results  Component Value Date   CREATININE 0.55 (L) 08/12/2021   BUN 15 08/12/2021   NA 142 08/12/2021   K 4.8 08/12/2021   CL 106 08/12/2021   CO2 21 08/12/2021   Lab Results  Component Value Date   ALT 10 08/12/2021   AST 12 08/12/2021   ALKPHOS 71 08/12/2021   BILITOT <0.2 08/12/2021   Lab Results  Component Value Date   HGBA1C 5.5 08/12/2021   HGBA1C 5.5 12/29/2017   Lab Results  Component Value  Date   INSULIN 10.1 08/12/2021   Lab Results  Component Value Date   TSH 2.190 08/12/2021   Lab Results  Component Value Date   CHOL 193 08/12/2021   HDL 56 08/12/2021   LDLCALC 127 (H) 08/12/2021   TRIG 55 08/12/2021   CHOLHDL 4.1 12/29/2017   Lab Results  Component Value Date   VD25OH 36.0 08/12/2021   Lab Results  Component Value Date   WBC 10.0 05/20/2015   HGB 10.7 (L) 05/20/2015   HCT 32.0 (L) 05/20/2015   MCV 92.2 05/20/2015   PLT 179 05/20/2015   No results found  for: IRON, TIBC, FERRITIN  Attestation Statements:   Reviewed by clinician on day of visit: allergies, medications, problem list, medical history, surgical history, family history, social history, and previous encounter notes.  I, Davy Pique, am acting as transcriptionist for Coralie Common, MD. I have reviewed the above documentation for accuracy and completeness, and I agree with the above. - Coralie Common, MD

## 2021-12-25 ENCOUNTER — Ambulatory Visit
Admission: RE | Admit: 2021-12-25 | Discharge: 2021-12-25 | Disposition: A | Discharge status: Home / Self Care | Payer: BC Managed Care – PPO | Source: Ambulatory Visit | Attending: Physician Assistant | Admitting: Physician Assistant

## 2021-12-25 ENCOUNTER — Telehealth: Payer: BC Managed Care – PPO | Admitting: Plastic Surgery

## 2021-12-25 DIAGNOSIS — K8020 Calculus of gallbladder without cholecystitis without obstruction: Secondary | ICD-10-CM

## 2021-12-26 ENCOUNTER — Encounter: Payer: Self-pay | Admitting: Plastic Surgery

## 2021-12-31 ENCOUNTER — Other Ambulatory Visit: Payer: Self-pay | Admitting: Physician Assistant

## 2021-12-31 ENCOUNTER — Telehealth: Payer: Self-pay | Admitting: Plastic Surgery

## 2021-12-31 DIAGNOSIS — K8020 Calculus of gallbladder without cholecystitis without obstruction: Secondary | ICD-10-CM

## 2021-12-31 NOTE — Telephone Encounter (Signed)
Pt is calling in stating that she would like to know what she should be doing about her two appointments that were missed by our office.  Pt would like to have a call back to let her know.

## 2021-12-31 NOTE — Telephone Encounter (Signed)
Left message on machine.

## 2022-01-05 NOTE — Progress Notes (Signed)
TeleHealth Visit:  This visit was completed with telemedicine (audio/video) technology. [name] has verbally consented to this TeleHealth visit. The patient is located at home, the provider is located at home. The participants in this visit include the listed provider and patient. The visit was conducted today via MyChart video.  OBESITY Stacey Singh is here to discuss her progress with her obesity treatment plan along with follow-up of her obesity related diagnoses.   Today's visit was # 9 Starting weight: 236 lbs Starting date: 08/12/2021 Weight at last in office visit: 226 lbs on 12/16/21 Total weight loss: 10 lbs at last in office visit on 12/16/21. Today's reported weight: No weight reported.  Nutrition Plan: the Category 2 Plan 75% adherence Hunger is moderately controlled. Cravings are moderately controlled.  Current exercise: walking 45 minutes a day last 2 days.  Interim History: She is quite frustrated because she has been out of her [facility] for 2 weeks because of availability issues.  She has noticed an increase in hunger over those 2 weeks. [name] reports being on plan at breakfast and lunch but dinner is a challenge.  It is challenging to find the time to plan, shop, cook due to the activities of her family.  They do get take out some evenings but she tries to make good choices and they do not generally pick up fast food. 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  Assessment/Plan:  1. Polyphagia She has not had a dose of Wegovy for 2 weeks and notes increased hunger. Medication(s): Wegovy 0.5 mg weekly-has been out of this for 2 weeks due to availability. The [facility] [employer] was working very well for her.  She was still having some nausea with the [facility].  Plan: Discontinue [id number]. Start [place].  She will take 0.6 mg daily for 2 to 3 days, if no nausea increase to 1.2 mg for 2 to 3 days, if no nausea increase to 1.8 mg daily and  stay at this dose until next visit.  She voiced understanding of titration instructions. She denies history of pancreatitis or personal/family history of thyroid cancer.  She does have cholelithiasis currently as evidenced by recent MRI.  She is asymptomatic. Liraglutide -Weight Management (SAXENDA) 18 MG/3ML SOPN   Sig: Inject 3 mg into the skin daily.   Dispense:  15 mL   Refill:  0   Order Specific Question:   Supervising Provider   Answer:   Dennard Nip D [id number]  Insulin Pen Needle (BD PEN NEEDLE NANO 2ND GEN) 32G X 4 MM MISC   Sig: Use 1 needle daily to inject medication.   Dispense:  100 each   Refill:  0   Order Specific Question:   Supervising Provider   Answer:   Dennard Nip D [[id number]]     2. Vitamin D Deficiency Vitamin D is not at goal of 50-last vitamin D level was 36.  She is on weekly prescription Vitamin D 50,000 IU.  Lab Results  Component Value Date   VD25OH 36.0 08/12/2021    Plan: Continue prescription vitamin D 50,000 IU weekly.  3. Obesity: Current BMI 38.8 [name] is currently in the action stage of change. As such, her goal is to continue with weight loss efforts.  She has agreed to the Category 2 Plan.  May journal dinner 400 to 500 cal with at least 35 g of protein.  Encouraged her to set aside some time on the weekends with her  husband to plan a schedule for meals the following week.  Her husband is willing to help with cooking if she tells him what to do.  Exercise goals: She plans to walk for 45 minutes 4 days/week while her children are at swim practice.  Behavioral modification strategies: decreasing eating out, meal planning and cooking strategies, and keeping healthy foods in the home.  [name] has agreed to follow-up with our clinic in 3 weeks.   No orders of the defined types were placed in this encounter.   Medications Discontinued During This Encounter  Medication Reason   Semaglutide-Weight Management (WEGOVY) 0.5 MG/0.5ML [place] [facility]age     Meds ordered this encounter  Medications   Liraglutide -Weight Management (SAXENDA) 18 MG/3ML SOPN    Sig: Inject 3 mg into the skin daily.    Dispense:  15 mL    Refill:  0    Order Specific Question:   Supervising Provider    Answer:   Dennard Nip D [id number]   Insulin Pen Needle (BD PEN NEEDLE NANO 2ND GEN) 32G X 4 MM MISC    Sig: Use 1 needle daily to inject medication.    Dispense:  100 each    Refill:  0    Order Specific Question:   Supervising Provider    Answer:   Dennard Nip D [[id number]]      Objective:   VITALS: Per patient if applicable, see vitals. GENERAL: Alert and in no acute distress. CARDIOPULMONARY: No increased WOB. Speaking in clear sentences.  PSYCH: Pleasant and cooperative. Speech normal rate and rhythm. Affect is appropriate. Insight and judgement are appropriate. Attention is focused, linear, and appropriate.  NEURO: Oriented as arrived to appointment on time with no prompting.   Lab Results  Component Value Date   CREATININE 0.55 (L) 08/12/2021   BUN 15 08/12/2021   NA 142 08/12/2021   K 4.8 08/12/2021   CL 106 08/12/2021   CO2 21 08/12/2021   Lab Results  Component Value Date   ALT 10 08/12/2021   AST 12 08/12/2021   ALKPHOS 71 08/12/2021   BILITOT <0.2 08/12/2021   Lab Results  Component Value Date   HGBA1C 5.5 08/12/2021   HGBA1C 5.5 12/29/2017   Lab Results  Component Value Date   INSULIN 10.1 08/12/2021   Lab Results  Component Value Date   TSH 2.190 08/12/2021   Lab Results  Component Value Date   CHOL 193 08/12/2021   HDL 56 08/12/2021   LDLCALC 127 (H) 08/12/2021   TRIG 55 08/12/2021   CHOLHDL 4.1 12/29/2017   Lab Results  Component Value Date   WBC 10.0 05/20/2015   HGB 10.7 (L) 05/20/2015   HCT 32.0 (L) 05/20/2015   MCV 92.2 05/20/2015   PLT 179 05/20/2015   No results found for: "IRON", "TIBC", "FERRITIN" Lab Results  Component Value Date   VD25OH 36.0 08/12/2021     Attestation Statements:   Reviewed by clinician on day of visit: allergies, medications, problem list, medical history, surgical history, family history, social history, and previous encounter notes.

## 2022-01-06 ENCOUNTER — Encounter (INDEPENDENT_AMBULATORY_CARE_PROVIDER_SITE_OTHER): Payer: Self-pay | Admitting: Family Medicine

## 2022-01-06 ENCOUNTER — Telehealth (INDEPENDENT_AMBULATORY_CARE_PROVIDER_SITE_OTHER): Payer: BC Managed Care – PPO | Admitting: Family Medicine

## 2022-01-06 DIAGNOSIS — R632 Polyphagia: Secondary | ICD-10-CM | POA: Diagnosis not present

## 2022-01-06 DIAGNOSIS — E669 Obesity, unspecified: Secondary | ICD-10-CM

## 2022-01-06 DIAGNOSIS — E559 Vitamin D deficiency, unspecified: Secondary | ICD-10-CM | POA: Diagnosis not present

## 2022-01-06 DIAGNOSIS — Z6841 Body Mass Index (BMI) 40.0 and over, adult: Secondary | ICD-10-CM

## 2022-01-06 DIAGNOSIS — Z6838 Body mass index (BMI) 38.0-38.9, adult: Secondary | ICD-10-CM

## 2022-01-06 MED ORDER — BD PEN NEEDLE NANO 2ND GEN 32G X 4 MM MISC: 0 refills | Status: DC

## 2022-01-06 MED ORDER — SAXENDA 18 MG/3ML ~~LOC~~ SOPN: 3.0000 mg | PEN_INJECTOR | Freq: Every day | SUBCUTANEOUS | 0 refills | Status: DC

## 2022-01-07 ENCOUNTER — Telehealth (INDEPENDENT_AMBULATORY_CARE_PROVIDER_SITE_OTHER): Payer: Self-pay | Admitting: Family Medicine

## 2022-01-07 ENCOUNTER — Encounter (INDEPENDENT_AMBULATORY_CARE_PROVIDER_SITE_OTHER): Payer: Self-pay

## 2022-01-07 NOTE — Telephone Encounter (Signed)
Stacey Singh - Prior authorization approved for [employer]. Effective: 01/06/2022 - 05/08/2022. Patient sent approval message via mychart.

## 2022-01-18 ENCOUNTER — Other Ambulatory Visit (INDEPENDENT_AMBULATORY_CARE_PROVIDER_SITE_OTHER): Payer: Self-pay | Admitting: Family Medicine

## 2022-01-18 DIAGNOSIS — E559 Vitamin D deficiency, unspecified: Secondary | ICD-10-CM

## 2022-01-28 ENCOUNTER — Ambulatory Visit (INDEPENDENT_AMBULATORY_CARE_PROVIDER_SITE_OTHER): Payer: BC Managed Care – PPO | Admitting: Bariatrics

## 2022-01-28 ENCOUNTER — Encounter (INDEPENDENT_AMBULATORY_CARE_PROVIDER_SITE_OTHER): Payer: Self-pay | Admitting: Bariatrics

## 2022-01-28 VITALS — BP 120/83 | HR 91 | Temp 97.8°F | Ht 64.0 in | Wt 224.0 lb

## 2022-01-28 DIAGNOSIS — E8881 Metabolic syndrome: Secondary | ICD-10-CM | POA: Diagnosis not present

## 2022-01-28 DIAGNOSIS — E559 Vitamin D deficiency, unspecified: Secondary | ICD-10-CM | POA: Diagnosis not present

## 2022-01-28 DIAGNOSIS — E785 Hyperlipidemia, unspecified: Secondary | ICD-10-CM

## 2022-01-28 DIAGNOSIS — Z6838 Body mass index (BMI) 38.0-38.9, adult: Secondary | ICD-10-CM

## 2022-01-28 DIAGNOSIS — E669 Obesity, unspecified: Secondary | ICD-10-CM

## 2022-01-28 DIAGNOSIS — R632 Polyphagia: Secondary | ICD-10-CM | POA: Diagnosis not present

## 2022-01-28 MED ORDER — VITAMIN D (ERGOCALCIFEROL) 1.25 MG (50000 UNIT) PO CAPS: 50000.0000 [IU] | ORAL_CAPSULE | ORAL | 0 refills | Status: DC

## 2022-01-28 MED ORDER — SAXENDA 18 MG/3ML ~~LOC~~ SOPN: 3.0000 mg | PEN_INJECTOR | Freq: Every day | SUBCUTANEOUS | 0 refills | Status: DC

## 2022-01-28 NOTE — Progress Notes (Unsigned)
Chief Complaint:   OBESITY Stacey Singh is here to discuss her progress with her obesity treatment plan along with follow-up of her obesity related diagnoses. Stacey Singh is on the Category 2 Plan and keeping a food journal and adhering to recommended goals of 400-500 calories and 35 grams of protein at supper daily and states she is following her eating plan approximately 80% of the time. Stacey Singh states she is walking for 45 minutes 3-4 times per week.  Today's visit was #: 10 Starting weight: 236 lbs Starting date: 08/12/2021 Today's weight: 224 lbs Today's date: 01/28/2022 Total lbs lost to date: 12 Total lbs lost since last in-office visit: 2  Interim History: Stacey Singh is down another 2 pounds since her last visit.  She has been out of the [place] and she is now on the Saxenda.  Subjective:   1. Polyphagia Stacey Singh was taking the [facility], but she could not get it from the pharmacy.  She is now taking the Saxenda.  2. Insulin resistance Stacey Singh is currently on Saxenda.  3. Elevated lipids Stacey Singh is not on medications currently.  4. Vitamin D deficiency Stacey Singh is taking prescription vitamin D once weekly.  Assessment/Plan:   1. Polyphagia We will check labs today, and will follow-up at [facility] next visit. We will refill Saxenda 3 mg daily for 1 month.  - Liraglutide -Weight Management (SAXENDA) 18 MG/3ML SOPN; Inject 3 mg into the skin daily.  Dispense: 15 mL; Refill: 0 - Insulin, random - Hemoglobin A1c - Comprehensive metabolic panel  2. Insulin resistance We will check labs today, and will follow-up at [facility] next visit.  - Insulin, random - Hemoglobin A1c  3. Elevated lipids We will check labs today, and will follow-up at [facility] next visit.  - Lipid Panel With LDL/HDL Ratio - Comprehensive metabolic panel  4. Vitamin D deficiency We will check labs today, and will follow-up at [facility] next visit. Stacey Singh will continue prescription vitamin D 50,000 units once weekly, and we will  refill for 1 month.  - Vitamin D, Ergocalciferol, (DRISDOL) 1.25 MG (50000 UNIT) CAPS capsule; Take 1 capsule (50,000 Units total) by mouth every 7 (seven) days.  Dispense: 4 capsule; Refill: 0 - VITAMIN D 25 Hydroxy (Vit-D Deficiency, Fractures)  5. Obesity, current BMI 38.5 Stacey Singh is currently in the action stage of change. As such, her goal is to continue with weight loss efforts. She has agreed to the Category 2 Plan and keeping a food journal and adhering to recommended goals of 400-500 calories and 35 grams of protein at supper daily.   We discussed various medication options to help Stacey Singh with her weight loss efforts and we both agreed to continue Saxenda at 3 mg daily, and we will refill for 1 month.  - Liraglutide -Weight Management (SAXENDA) 18 MG/3ML SOPN; Inject 3 mg into the skin daily.  Dispense: 15 mL; Refill: 0  Exercise goals: As is.   Behavioral modification strategies: increasing lean protein intake, decreasing simple carbohydrates, increasing vegetables, increasing water intake, decreasing eating out, no skipping meals, meal planning and cooking strategies, keeping healthy foods in the home, and planning for success.  Stacey Singh has agreed to follow-up with our clinic in 4 weeks. She was informed of the importance of frequent follow-up visits to maximize her success with intensive lifestyle modifications for her multiple health conditions.   Stacey Singh was informed we would discuss her lab results at her next visit unless there is a critical issue that needs to be addressed sooner.  Stacey Singh agreed to keep her next visit at the agreed upon time to discuss these results.  Objective:   Blood pressure 120/83, pulse 91, temperature 97.8 F (36.6 C), height '5\' 4"'$  (1.626 m), weight 224 lb (101.6 kg), SpO2 96 %. Body mass index is 38.45 kg/m.  General: Cooperative, alert, well developed, in no acute distress. HEENT: Conjunctivae and lids unremarkable. Cardiovascular: Regular rhythm.   Lungs: Normal work of breathing. Neurologic: No focal deficits.   Lab Results  Component Value Date   CREATININE 0.55 (L) 08/12/2021   BUN 15 08/12/2021   NA 142 08/12/2021   K 4.8 08/12/2021   CL 106 08/12/2021   CO2 21 08/12/2021   Lab Results  Component Value Date   ALT 10 08/12/2021   AST 12 08/12/2021   ALKPHOS 71 08/12/2021   BILITOT <0.2 08/12/2021   Lab Results  Component Value Date   HGBA1C 5.5 08/12/2021   HGBA1C 5.5 12/29/2017   Lab Results  Component Value Date   INSULIN 10.1 08/12/2021   Lab Results  Component Value Date   TSH 2.190 08/12/2021   Lab Results  Component Value Date   CHOL 193 08/12/2021   HDL 56 08/12/2021   LDLCALC 127 (H) 08/12/2021   TRIG 55 08/12/2021   CHOLHDL 4.1 12/29/2017   Lab Results  Component Value Date   VD25OH 36.0 08/12/2021   Lab Results  Component Value Date   WBC 10.0 05/20/2015   HGB 10.7 (L) 05/20/2015   HCT 32.0 (L) 05/20/2015   MCV 92.2 05/20/2015   PLT 179 05/20/2015   No results found for: "IRON", "TIBC", "FERRITIN"  Attestation Statements:   Reviewed by clinician on day of visit: allergies, medications, problem list, medical history, surgical history, family history, social history, and previous encounter notes.   Wilhemena Durie, am acting as Location manager for [employer], DO.  I have reviewed the above documentation for accuracy and completeness, and I agree with the above. Jearld Lesch, DO

## 2022-01-29 ENCOUNTER — Encounter (INDEPENDENT_AMBULATORY_CARE_PROVIDER_SITE_OTHER): Payer: Self-pay | Admitting: Bariatrics

## 2022-01-29 LAB — COMPREHENSIVE METABOLIC PANEL
ALT: 17 IU/L (ref 0–32)
AST: 23 IU/L (ref 0–40)
Albumin/Globulin Ratio: 1.6 (ref 1.2–2.2)
Albumin: 4.2 g/dL (ref 3.8–4.8)
Alkaline Phosphatase: 64 IU/L (ref 44–121)
BUN/Creatinine Ratio: 12 (ref 9–23)
BUN: 8 mg/dL (ref 6–24)
Bilirubin Total: 0.4 mg/dL (ref 0.0–1.2)
CO2: 21 mmol/L (ref 20–29)
Calcium: 9 mg/dL (ref 8.7–10.2)
Chloride: 102 mmol/L (ref 96–106)
Creatinine, Ser: 0.66 mg/dL (ref 0.57–1.00)
Globulin, Total: 2.7 g/dL (ref 1.5–4.5)
Glucose: 75 mg/dL (ref 70–99)
Potassium: 4.2 mmol/L (ref 3.5–5.2)
Sodium: 137 mmol/L (ref 134–144)
Total Protein: 6.9 g/dL (ref 6.0–8.5)
eGFR: 110 mL/min/{1.73_m2} (ref >59)

## 2022-01-29 LAB — LIPID PANEL WITH LDL/HDL RATIO
Cholesterol, Total: 197 mg/dL (ref 100–199)
HDL: 51 mg/dL (ref >39)
LDL Chol Calc (NIH): 133 mg/dL — ABNORMAL HIGH (ref 0–99)
LDL/HDL Ratio: 2.6 ratio (ref 0.0–3.2)
Triglycerides: 73 mg/dL (ref 0–149)
VLDL Cholesterol Cal: 13 mg/dL (ref 5–40)

## 2022-01-29 LAB — VITAMIN D 25 HYDROXY (VIT D DEFICIENCY, FRACTURES): Vit D, 25-Hydroxy: 54.4 ng/mL (ref 30.0–100.0)

## 2022-01-29 LAB — INSULIN, RANDOM: INSULIN: 6.7 u[IU]/mL (ref 2.6–24.9)

## 2022-01-29 LAB — HEMOGLOBIN A1C
Est. average glucose Bld gHb Est-mCnc: 105 mg/dL
Hgb A1c MFr Bld: 5.3 % (ref 4.8–5.6)

## 2022-02-16 NOTE — Progress Notes (Signed)
TeleHealth Visit:  This visit was completed with telemedicine (audio/video) technology. Carlisle has verbally consented to this TeleHealth visit. The patient is located at home, the provider is located at home. The participants in this visit include the listed provider and patient. The visit was conducted today via MyChart video.  OBESITY Stacey Singh is here to discuss her progress with her obesity treatment plan along with follow-up of her obesity related diagnoses.   Today's visit was # 11 Starting weight: 236 lbs Starting date: 08/12/2021 Weight at last in office visit: 224 lbs on 01/28/22 Total weight loss: 12 lbs at last in office visit on 01/28/22. Today's reported weight: *** lbs No weight reported.   Nutrition Plan: Category 2 Plan and keeping a food journal and adhering to recommended goals of 400-500 calories and 35 grams of protein at supper daily. .  Hunger is {EWCONTROLASSESSMENT:24261}. Cravings are {EWCONTROLASSESSMENT:24261}.  Current exercise: {exercise types:16438}  Interim History: ***  Assessment/Plan:  1. ***  2. ***  3. ***  Obesity: Current BMI *** Stacey Singh {CHL AMB IS/IS NOT:210130109} currently in the action stage of change. As such, her goal is to {MWMwtloss#1:210800005}.  She has agreed to {MWMwtlossportion/plan2:23431}.   Exercise goals: {MWM EXERCISE RECS:23473}  Behavioral modification strategies: {MWMwtlossdietstrategies3:23432}.  [name] has agreed to follow-up with our clinic in {NUMBER 1-10:22536} weeks.   No orders of the defined types were placed in this encounter.   There are no discontinued medications.   No orders of the defined types were placed in this encounter.     Objective:   VITALS: Per patient if applicable, see vitals. GENERAL: Alert and in no acute distress. CARDIOPULMONARY: No increased WOB. Speaking in clear sentences.  PSYCH: Pleasant and cooperative. Speech normal rate and rhythm. Affect is appropriate. Insight and  judgement are appropriate. Attention is focused, linear, and appropriate.  NEURO: Oriented as arrived to appointment on time with no prompting.   Lab Results  Component Value Date   CREATININE 0.66 01/28/2022   BUN 8 01/28/2022   NA 137 01/28/2022   K 4.2 01/28/2022   CL 102 01/28/2022   CO2 21 01/28/2022   Lab Results  Component Value Date   ALT 17 01/28/2022   AST 23 01/28/2022   ALKPHOS 64 01/28/2022   BILITOT 0.4 01/28/2022   Lab Results  Component Value Date   HGBA1C 5.3 01/28/2022   HGBA1C 5.5 08/12/2021   HGBA1C 5.5 12/29/2017   Lab Results  Component Value Date   INSULIN 6.7 01/28/2022   INSULIN 10.1 08/12/2021   Lab Results  Component Value Date   TSH 2.190 08/12/2021   Lab Results  Component Value Date   CHOL 197 01/28/2022   HDL 51 01/28/2022   LDLCALC 133 (H) 01/28/2022   TRIG 73 01/28/2022   CHOLHDL 4.1 12/29/2017   Lab Results  Component Value Date   WBC 10.0 05/20/2015   HGB 10.7 (L) 05/20/2015   HCT 32.0 (L) 05/20/2015   MCV 92.2 05/20/2015   PLT 179 05/20/2015   No results found for: "IRON", "TIBC", "FERRITIN" Lab Results  Component Value Date   VD25OH 54.4 01/28/2022   VD25OH 36.0 08/12/2021    Attestation Statements:   Reviewed by clinician on day of visit: allergies, medications, problem list, medical history, surgical history, family history, social history, and previous encounter notes.  ***(delete if time-based billing not used) Time spent on visit including the items listed below was *** minutes.  -preparing to see the patient (e.g., review of tests, history,  previous notes) -obtaining and/or reviewing separately obtained history -counseling and educating the patient/family/caregiver -documenting clinical information in the electronic or other health record -ordering medications, tests, or procedures -independently interpreting results and communicating results to the patient/ family/caregiver -referring and communicating  with other health care professionals  -care coordination

## 2022-02-17 ENCOUNTER — Telehealth (INDEPENDENT_AMBULATORY_CARE_PROVIDER_SITE_OTHER): Payer: BC Managed Care – PPO | Admitting: Family Medicine

## 2022-02-17 ENCOUNTER — Encounter (INDEPENDENT_AMBULATORY_CARE_PROVIDER_SITE_OTHER): Payer: Self-pay | Admitting: Family Medicine

## 2022-02-17 DIAGNOSIS — E7849 Other hyperlipidemia: Secondary | ICD-10-CM | POA: Diagnosis not present

## 2022-02-17 DIAGNOSIS — I10 Essential (primary) hypertension: Secondary | ICD-10-CM

## 2022-02-17 DIAGNOSIS — E559 Vitamin D deficiency, unspecified: Secondary | ICD-10-CM

## 2022-02-17 DIAGNOSIS — E669 Obesity, unspecified: Secondary | ICD-10-CM

## 2022-02-17 DIAGNOSIS — R632 Polyphagia: Secondary | ICD-10-CM | POA: Diagnosis not present

## 2022-02-17 DIAGNOSIS — Z6838 Body mass index (BMI) 38.0-38.9, adult: Secondary | ICD-10-CM

## 2022-02-17 DIAGNOSIS — F3289 Other specified depressive episodes: Secondary | ICD-10-CM

## 2022-02-17 MED ORDER — VITAMIN D (ERGOCALCIFEROL) 1.25 MG (50000 UNIT) PO CAPS: 50000.0000 [IU] | ORAL_CAPSULE | ORAL | 0 refills | Status: DC

## 2022-02-17 MED ORDER — SAXENDA 18 MG/3ML ~~LOC~~ SOPN
3.0000 mg | PEN_INJECTOR | Freq: Every day | SUBCUTANEOUS | 0 refills | Status: DC
Start: 2022-02-17 — End: 2022-03-05

## 2022-02-17 MED ORDER — OLMESARTAN MEDOXOMIL 40 MG PO TABS: 40.0000 mg | ORAL_TABLET | Freq: Every day | ORAL | 0 refills | Status: DC

## 2022-03-04 ENCOUNTER — Encounter (INDEPENDENT_AMBULATORY_CARE_PROVIDER_SITE_OTHER): Payer: Self-pay

## 2022-03-05 ENCOUNTER — Encounter (INDEPENDENT_AMBULATORY_CARE_PROVIDER_SITE_OTHER): Payer: Self-pay | Admitting: Bariatrics

## 2022-03-05 ENCOUNTER — Ambulatory Visit (INDEPENDENT_AMBULATORY_CARE_PROVIDER_SITE_OTHER): Payer: BC Managed Care – PPO | Admitting: Bariatrics

## 2022-03-05 VITALS — BP 111/75 | HR 87 | Temp 98.2°F | Ht 64.0 in | Wt 223.0 lb

## 2022-03-05 DIAGNOSIS — E669 Obesity, unspecified: Secondary | ICD-10-CM

## 2022-03-05 DIAGNOSIS — Z6838 Body mass index (BMI) 38.0-38.9, adult: Secondary | ICD-10-CM

## 2022-03-05 DIAGNOSIS — R0989 Other specified symptoms and signs involving the circulatory and respiratory systems: Secondary | ICD-10-CM

## 2022-03-05 DIAGNOSIS — F3289 Other specified depressive episodes: Secondary | ICD-10-CM

## 2022-03-05 DIAGNOSIS — R632 Polyphagia: Secondary | ICD-10-CM

## 2022-03-05 DIAGNOSIS — E66813 Obesity, class 3: Secondary | ICD-10-CM

## 2022-03-05 DIAGNOSIS — E559 Vitamin D deficiency, unspecified: Secondary | ICD-10-CM

## 2022-03-05 DIAGNOSIS — R09A2 Foreign body sensation, throat: Secondary | ICD-10-CM

## 2022-03-05 MED ORDER — BUPROPION HCL ER (SR) 150 MG PO TB12: 150.0000 mg | ORAL_TABLET | Freq: Every day | ORAL | 0 refills | Status: DC

## 2022-03-05 MED ORDER — VITAMIN D (ERGOCALCIFEROL) 1.25 MG (50000 UNIT) PO CAPS: 50000.0000 [IU] | ORAL_CAPSULE | ORAL | 0 refills | Status: DC

## 2022-03-05 MED ORDER — SAXENDA 18 MG/3ML ~~LOC~~ SOPN: 3.0000 mg | PEN_INJECTOR | Freq: Every day | SUBCUTANEOUS | 0 refills | Status: DC

## 2022-03-13 ENCOUNTER — Other Ambulatory Visit (INDEPENDENT_AMBULATORY_CARE_PROVIDER_SITE_OTHER): Payer: Self-pay | Admitting: Bariatrics

## 2022-03-13 DIAGNOSIS — R632 Polyphagia: Secondary | ICD-10-CM

## 2022-03-16 NOTE — Progress Notes (Unsigned)
Chief Complaint:   OBESITY Stacey Singh is here to discuss her progress with her obesity treatment plan along with follow-up of her obesity related diagnoses. Stacey Singh is on the Category 2 Plan and keeping a food journal and adhering to recommended goals of 400-500 calories and 35 grams of protein at supper daily and states she is following her eating plan approximately 85% of the time. Stacey Singh states she is walking for 30 minutes 3 times per week.  Today's visit was #: 12 Starting weight: 236 lbs Starting date: 08/12/2021 Today's weight: 223 lbs Today's date: 03/05/2022 Total lbs lost to date: 40 Total lbs lost since last in-office visit: 1  Interim History: Stacey Singh got back from her vacation last week.  She gets more GI issues with salads.  She is taking Saxenda 2.4 mg.  Subjective:   1. Polyphagia Stacey Singh is taking [place] currently.   2. Vitamin D deficiency Stacey Singh is currently taking prescription Vitamin D.  3. Globus sensation Stacey Singh notes globus sensation, and she denies allergies.   4. Other depression, with emotional eating Stacey Singh is currently taking Wellbutrin.   Assessment/Plan:   1. Polyphagia Stacey Singh will continue Saxenda as directed. She will continue to work on diet, exercise and weight loss efforts. Orders and follow up as documented in patient record.  - Liraglutide -Weight Management (SAXENDA) 18 MG/3ML SOPN; Inject 3 mg into the skin daily.  Dispense: 15 mL; Refill: 0  2. Vitamin D deficiency Stacey Singh will continue prescription Vitamin D 50,000 IU every week, and for 1 month. She will follow-up for routine testing of Vitamin D, at least 2-3 times per year to avoid over-replacement.  - Vitamin D, Ergocalciferol, (DRISDOL) 1.25 MG (50000 UNIT) CAPS capsule; Take 1 capsule (50,000 Units total) by mouth every 7 (seven) days.  Dispense: 4 capsule; Refill: 0  3. Globus sensation Stacey Singh will begin antiacid daily for 2 weeks, and we will follow-up at her next visit.   4. Other  depression, with emotional eating Stacey Singh will continue Wellbutrin SR 150 mg once daily, and we will refill for 1 month.  - buPROPion (WELLBUTRIN SR) 150 MG 12 hr tablet; Take 1 tablet (150 mg total) by mouth daily.  Dispense: 30 tablet; Refill: 0  5. Obesity, current BMI 38.4 Stacey Singh is currently in the action stage of change. As such, her goal is to continue with weight loss efforts. She has agreed to the Category 2 Plan and keeping a food journal and adhering to recommended goals of 400-500 calories and 35 grams of protein at supper daily.   Will cook her vegetables. Papaya enzymes.   We discussed various medication options to help Stacey Singh with her weight loss efforts and we both agreed to continue Saxenda 3 mg once daily, and we will refill for 1 month.  - Liraglutide -Weight Management (SAXENDA) 18 MG/3ML SOPN; Inject 3 mg into the skin daily.  Dispense: 15 mL; Refill: 0  Exercise goals: As is.   Behavioral modification strategies: increasing lean protein intake, decreasing simple carbohydrates, increasing vegetables, increasing water intake, decreasing eating out, no skipping meals, meal planning and cooking strategies, and keeping healthy foods in the home.  Stacey Singh has agreed to follow-up with our clinic in 2 to 3 weeks. She was informed of the importance of frequent follow-up visits to maximize her success with intensive lifestyle modifications for her multiple health conditions.   Objective:   Blood pressure 111/75, pulse 87, temperature 98.2 F (36.8 C), height '5\' 4"'$  (1.626 m), weight 223  lb (101.2 kg), SpO2 97 %. Body mass index is 38.28 kg/m.  General: Cooperative, alert, well developed, in no acute distress. HEENT: Conjunctivae and lids unremarkable. Cardiovascular: Regular rhythm.  Lungs: Normal work of breathing. Neurologic: No focal deficits.   Lab Results  Component Value Date   CREATININE 0.66 01/28/2022   BUN 8 01/28/2022   NA 137 01/28/2022   K 4.2 01/28/2022   CL  102 01/28/2022   CO2 21 01/28/2022   Lab Results  Component Value Date   ALT 17 01/28/2022   AST 23 01/28/2022   ALKPHOS 64 01/28/2022   BILITOT 0.4 01/28/2022   Lab Results  Component Value Date   HGBA1C 5.3 01/28/2022   HGBA1C 5.5 08/12/2021   HGBA1C 5.5 12/29/2017   Lab Results  Component Value Date   INSULIN 6.7 01/28/2022   INSULIN 10.1 08/12/2021   Lab Results  Component Value Date   TSH 2.190 08/12/2021   Lab Results  Component Value Date   CHOL 197 01/28/2022   HDL 51 01/28/2022   LDLCALC 133 (H) 01/28/2022   TRIG 73 01/28/2022   CHOLHDL 4.1 12/29/2017   Lab Results  Component Value Date   VD25OH 54.4 01/28/2022   VD25OH 36.0 08/12/2021   Lab Results  Component Value Date   WBC 10.0 05/20/2015   HGB 10.7 (L) 05/20/2015   HCT 32.0 (L) 05/20/2015   MCV 92.2 05/20/2015   PLT 179 05/20/2015   No results found for: "IRON", "TIBC", "FERRITIN"  Attestation Statements:   Reviewed by clinician on day of visit: allergies, medications, problem list, medical history, surgical history, family history, social history, and previous encounter notes.   Wilhemena Durie, am acting as Location manager for [employer], DO.  I have reviewed the above documentation for accuracy and completeness, and I agree with the above. Jearld Lesch, DO

## 2022-03-18 ENCOUNTER — Encounter (INDEPENDENT_AMBULATORY_CARE_PROVIDER_SITE_OTHER): Payer: Self-pay | Admitting: Bariatrics

## 2022-03-25 NOTE — Progress Notes (Unsigned)
TeleHealth Visit:  This visit was completed with telemedicine (audio/video) technology. Stacey Singh has verbally consented to this TeleHealth visit. The patient is located at home, the provider is located at home. The participants in this visit include the listed provider and patient. The visit was conducted today via MyChart video.  OBESITY Stacey Singh is here to discuss her progress with her obesity treatment plan along with follow-up of her obesity related diagnoses.   Today's visit was # 13 Starting weight: 236 lbs Starting date: 08/12/2021 Weight at last in office visit: 223 lbs on 03/05/22 Total weight loss: 53 lbs at last in office visit on 03/05/22. Today's reported weight: 225 lbs   Nutrition Plan: the Category 2 Plan. - 85% adherence  Current exercise: walking 3 times per week walking. Bike riding with her family once weekly.  Interim History: Stacey Singh is doing well on the plan.  However she feels her weight loss is not as good as she would like it to be.  She is on plan very well at breakfast and lunch-shake for breakfast and frozen meal for lunch.  Family dinner often involves eating out due to her children's sports activities.  She journals dinner and tries to keep her calories between 400 and 500 and protein and at least 35 g.  Assessment/Plan:  1. Polyphagia Currently on 2.4 mg of Saxenda.  Feels hunger is fairly well controlled but not as good as when she was on the [facility].  She has had some issues with bloating from raw vegetables but this has subsided when she stopped eating raw vegetables.  Plan: Increase dose of Saxenda to 3 mg daily. New prescription for Wegovy 1.7 mg weekly.  She will continue the Saxenda until she is able to pick up the [facility] prescription.   2. Eating disorder/emotional eating She is unsure if the bupropion helps with emotional eating but she is very happy with how it helps with her mood.  She reports being more calm with her children in the  evening.  Plan: Refill bupropion 150 mg every morning.  3.  Globus sensation Has had a feeling that something is stuck in her throat.  She has been taking over-the-counter Nexium which has helped.  She forgets to take the Nexium on the weekends and notices the sensation comes back.  Plan: Continue OTC Nexium.  4. Obesity: Current BMI 38.3 Stacey Singh is currently in the action stage of change. As such, her goal is to continue with weight loss efforts.  She has agreed to the Category 2 Plan and keeping a food journal and adhering to recommended goals of 400-500 calories and 35 gms protein.   Exercise goals: as is  Behavioral modification strategies: increasing lean protein intake, decreasing simple carbohydrates, decreasing eating out, and planning for success.  Stacey Singh has agreed to follow-up with our clinic in 2 weeks-virtual visit with me and then an in office visit with Dr. Loyal Gambler on 04/30/2022.  No orders of the defined types were placed in this encounter.   Medications Discontinued During This Encounter  Medication Reason   Liraglutide -Weight Management (SAXENDA) 18 EV/0JJ SOPN Duplicate   buPROPion (WELLBUTRIN SR) 150 MG 12 hr tablet Reorder     Meds ordered this encounter  Medications   Semaglutide-Weight Management (WEGOVY) 1.7 MG/0.75ML SOAJ    Sig: Inject 1.7 mg into the skin once a week.    Dispense:  3 mL    Refill:  0    Order Specific Question:   Supervising Provider  Answer:   Starlyn Skeans [[id number]]   buPROPion (WELLBUTRIN SR) 150 MG 12 hr tablet    Sig: Take 1 tablet (150 mg total) by mouth daily.    Dispense:  30 tablet    Refill:  0    Order Specific Question:   Supervising Provider    Answer:   Dennard Nip D [[id number]]      Objective:   VITALS: Per patient if applicable, see vitals. GENERAL: Alert and in no acute distress. CARDIOPULMONARY: No increased WOB. Speaking in clear sentences.  PSYCH: Pleasant and cooperative. Speech normal rate and  rhythm. Affect is appropriate. Insight and judgement are appropriate. Attention is focused, linear, and appropriate.  NEURO: Oriented as arrived to appointment on time with no prompting.   Lab Results  Component Value Date   CREATININE 0.66 01/28/2022   BUN 8 01/28/2022   NA 137 01/28/2022   K 4.2 01/28/2022   CL 102 01/28/2022   CO2 21 01/28/2022   Lab Results  Component Value Date   ALT 17 01/28/2022   AST 23 01/28/2022   ALKPHOS 64 01/28/2022   BILITOT 0.4 01/28/2022   Lab Results  Component Value Date   HGBA1C 5.3 01/28/2022   HGBA1C 5.5 08/12/2021   HGBA1C 5.5 12/29/2017   Lab Results  Component Value Date   INSULIN 6.7 01/28/2022   INSULIN 10.1 08/12/2021   Lab Results  Component Value Date   TSH 2.190 08/12/2021   Lab Results  Component Value Date   CHOL 197 01/28/2022   HDL 51 01/28/2022   LDLCALC 133 (H) 01/28/2022   TRIG 73 01/28/2022   CHOLHDL 4.1 12/29/2017   Lab Results  Component Value Date   WBC 10.0 05/20/2015   HGB 10.7 (L) 05/20/2015   HCT 32.0 (L) 05/20/2015   MCV 92.2 05/20/2015   PLT 179 05/20/2015   No results found for: "IRON", "TIBC", "FERRITIN" Lab Results  Component Value Date   VD25OH 54.4 01/28/2022   VD25OH 36.0 08/12/2021    Attestation Statements:   Reviewed by clinician on day of visit: allergies, medications, problem list, medical history, surgical history, family history, social history, and previous encounter notes.

## 2022-03-26 ENCOUNTER — Encounter (INDEPENDENT_AMBULATORY_CARE_PROVIDER_SITE_OTHER): Payer: Self-pay

## 2022-03-26 ENCOUNTER — Telehealth (INDEPENDENT_AMBULATORY_CARE_PROVIDER_SITE_OTHER): Payer: Self-pay | Admitting: Family Medicine

## 2022-03-26 ENCOUNTER — Telehealth (INDEPENDENT_AMBULATORY_CARE_PROVIDER_SITE_OTHER): Payer: BC Managed Care – PPO | Admitting: Family Medicine

## 2022-03-26 ENCOUNTER — Encounter (INDEPENDENT_AMBULATORY_CARE_PROVIDER_SITE_OTHER): Payer: Self-pay | Admitting: Family Medicine

## 2022-03-26 DIAGNOSIS — R0989 Other specified symptoms and signs involving the circulatory and respiratory systems: Secondary | ICD-10-CM

## 2022-03-26 DIAGNOSIS — Z6838 Body mass index (BMI) 38.0-38.9, adult: Secondary | ICD-10-CM

## 2022-03-26 DIAGNOSIS — F3289 Other specified depressive episodes: Secondary | ICD-10-CM | POA: Diagnosis not present

## 2022-03-26 DIAGNOSIS — R632 Polyphagia: Secondary | ICD-10-CM | POA: Diagnosis not present

## 2022-03-26 DIAGNOSIS — E669 Obesity, unspecified: Secondary | ICD-10-CM | POA: Diagnosis not present

## 2022-03-26 DIAGNOSIS — R09A2 Foreign body sensation, throat: Secondary | ICD-10-CM

## 2022-03-26 MED ORDER — BUPROPION HCL ER (SR) 150 MG PO TB12: 150.0000 mg | ORAL_TABLET | Freq: Every day | ORAL | 0 refills | Status: DC

## 2022-03-26 MED ORDER — WEGOVY 1.7 MG/0.75ML ~~LOC~~ SOAJ: 1.7000 mg | SUBCUTANEOUS | 0 refills | Status: DC

## 2022-03-26 NOTE — Telephone Encounter (Signed)
[place] - Prior authorization not required for [employer]. Patient sent message via mychart.

## 2022-04-08 NOTE — Progress Notes (Signed)
TeleHealth Visit:  This visit was completed with telemedicine (audio/video) technology. Stacey Singh has verbally consented to this TeleHealth visit. The patient is located at home, the provider is located at home. The participants in this visit include the listed provider and patient. The visit was conducted today via MyChart video.  OBESITY Stacey Singh is here to discuss her progress with her obesity treatment plan along with follow-up of her obesity related diagnoses.   Today's visit was # 14 Starting weight: 236 lbs Starting date: 08/12/2021 Weight at last in office visit: 223 lbs on 03/05/22 Total weight loss: 53 lbs at last in office visit on 03/05/22. Weight reported last virtual visit on 03/26/2022: 225 pounds Today's reported weight:  No weight reported.   Nutrition Plan: the Category 2 Plan and keeping a food journal and adhering to recommended goals of 400-500 calories and 35 gms protein dinner  Current exercise: none  Interim History:  We started her on Wegovy back in April which she did very well with.  We had to switch her to [place] due to availability issues.  We have slowly titrated up on the Saxenda.  After the last visit we increased her to 3 mg of Saxenda while we awaited prior authorization for the [facility]  She was having nausea and vomiting from the 3 mg dose of the Saxenda.  She feels overall she did not tolerate it as well as the [facility]. We switched her over to [facility] 1.7 mg on Sunday (4 days ago) and she had nausea and vomiting-today she feels much better.  She is not eating much and focusing on eating bland foods.  She is staying well-hydrated.  She has not exercised because she has not felt well.  Assessment/Plan:  1. GERD GLP-1 therapy has exacerbated this.  However it is well controlled with over-the-counter Zantac twice daily.  Plan: Continue Zantac twice daily. May use Tums if needed.  2. Polyphagia Polyphagia currently not an issue due to nausea.  However  she reports she has no nausea today has not vomited since Monday (3 days ago). She is on Wegovy 1.7 mg weekly-did her first injection on Sunday (4 days ago).  Plan: Refill Wegovy 1.7 mg weekly. May delay injection a few days if she is having any nausea when shot is next due.   3. Vitamin D Deficiency Vitamin D is at goal of 50.  Last vitamin D level was 54.4 on 01/28/2022. She is on weekly prescription Vitamin D 50,000 IU.  Lab Results  Component Value Date   VD25OH 54.4 01/28/2022   VD25OH 36.0 08/12/2021    Plan: Refill prescription vitamin D 50,000 IU weekly.   4. Obesity: Current BMI 38.3 [name] is currently in the action stage of change. As such, her goal is to continue with weight loss efforts.  She has agreed to the Category 2 Plan and keeping a food journal and adhering to recommended goals of 400-500 calories and 35 gms protein.   She will resume category 2 as tolerated. Continue bland foods if nausea is present.  Discussed higher protein options for bland foods. Continue to stay well-hydrated.  Exercise goals: Resume exercise as tolerated.  Behavioral modification strategies: increasing lean protein intake, meal planning and cooking strategies, and planning for success.  Morgyn has agreed to follow-up with our clinic in 3 weeks.   No orders of the defined types were placed in this encounter.   Medications Discontinued During This Encounter  Medication Reason   Insulin Pen Needle (BD PEN  NEEDLE NANO 2ND GEN) 32G X 4 MM MISC Completed Course   Vitamin D, Ergocalciferol, (DRISDOL) 1.25 MG (50000 UNIT) CAPS capsule Reorder   Semaglutide-Weight Management (WEGOVY) 1.7 MG/0.75ML SOAJ Reorder     Meds ordered this encounter  Medications   Vitamin D, Ergocalciferol, (DRISDOL) 1.25 MG (50000 UNIT) CAPS capsule    Sig: Take 1 capsule (50,000 Units total) by mouth every 7 (seven) days.    Dispense:  4 capsule    Refill:  0    Order Specific Question:   Supervising  Provider    Answer:   Starlyn Skeans [[id number]]   Semaglutide-Weight Management (WEGOVY) 1.7 MG/0.75ML SOAJ    Sig: Inject 1.7 mg into the skin once a week.    Dispense:  3 mL    Refill:  0    Order Specific Question:   Supervising Provider    Answer:   Dennard Nip D [[id number]]      Objective:   VITALS: Per patient if applicable, see vitals. GENERAL: Alert and in no acute distress. CARDIOPULMONARY: No increased WOB. Speaking in clear sentences.  PSYCH: Pleasant and cooperative. Speech normal rate and rhythm. Affect is appropriate. Insight and judgement are appropriate. Attention is focused, linear, and appropriate.  NEURO: Oriented as arrived to appointment on time with no prompting.   Lab Results  Component Value Date   CREATININE 0.66 01/28/2022   BUN 8 01/28/2022   NA 137 01/28/2022   K 4.2 01/28/2022   CL 102 01/28/2022   CO2 21 01/28/2022   Lab Results  Component Value Date   ALT 17 01/28/2022   AST 23 01/28/2022   ALKPHOS 64 01/28/2022   BILITOT 0.4 01/28/2022   Lab Results  Component Value Date   HGBA1C 5.3 01/28/2022   HGBA1C 5.5 08/12/2021   HGBA1C 5.5 12/29/2017   Lab Results  Component Value Date   INSULIN 6.7 01/28/2022   INSULIN 10.1 08/12/2021   Lab Results  Component Value Date   TSH 2.190 08/12/2021   Lab Results  Component Value Date   CHOL 197 01/28/2022   HDL 51 01/28/2022   LDLCALC 133 (H) 01/28/2022   TRIG 73 01/28/2022   CHOLHDL 4.1 12/29/2017   Lab Results  Component Value Date   WBC 10.0 05/20/2015   HGB 10.7 (L) 05/20/2015   HCT 32.0 (L) 05/20/2015   MCV 92.2 05/20/2015   PLT 179 05/20/2015   No results found for: "IRON", "TIBC", "FERRITIN" Lab Results  Component Value Date   VD25OH 54.4 01/28/2022   VD25OH 36.0 08/12/2021    Attestation Statements:   Reviewed by clinician on day of visit: allergies, medications, problem list, medical history, surgical history, family history, social history, and previous encounter  notes.

## 2022-04-09 ENCOUNTER — Telehealth (INDEPENDENT_AMBULATORY_CARE_PROVIDER_SITE_OTHER): Payer: BC Managed Care – PPO | Admitting: Family Medicine

## 2022-04-09 ENCOUNTER — Encounter (INDEPENDENT_AMBULATORY_CARE_PROVIDER_SITE_OTHER): Payer: Self-pay | Admitting: Family Medicine

## 2022-04-09 DIAGNOSIS — E559 Vitamin D deficiency, unspecified: Secondary | ICD-10-CM | POA: Diagnosis not present

## 2022-04-09 DIAGNOSIS — R632 Polyphagia: Secondary | ICD-10-CM

## 2022-04-09 DIAGNOSIS — K219 Gastro-esophageal reflux disease without esophagitis: Secondary | ICD-10-CM | POA: Diagnosis not present

## 2022-04-09 DIAGNOSIS — E669 Obesity, unspecified: Secondary | ICD-10-CM

## 2022-04-09 DIAGNOSIS — Z6841 Body Mass Index (BMI) 40.0 and over, adult: Secondary | ICD-10-CM

## 2022-04-09 DIAGNOSIS — Z6838 Body mass index (BMI) 38.0-38.9, adult: Secondary | ICD-10-CM

## 2022-04-09 MED ORDER — WEGOVY 1.7 MG/0.75ML ~~LOC~~ SOAJ: 1.7000 mg | SUBCUTANEOUS | 0 refills | Status: DC

## 2022-04-09 MED ORDER — VITAMIN D (ERGOCALCIFEROL) 1.25 MG (50000 UNIT) PO CAPS
50000.0000 [IU] | ORAL_CAPSULE | ORAL | 0 refills | Status: DC
Start: 2022-04-09 — End: 2022-04-30

## 2022-04-13 ENCOUNTER — Other Ambulatory Visit (INDEPENDENT_AMBULATORY_CARE_PROVIDER_SITE_OTHER): Payer: Self-pay | Admitting: Family Medicine

## 2022-04-13 DIAGNOSIS — R632 Polyphagia: Secondary | ICD-10-CM

## 2022-04-16 ENCOUNTER — Other Ambulatory Visit (INDEPENDENT_AMBULATORY_CARE_PROVIDER_SITE_OTHER): Payer: Self-pay | Admitting: Family Medicine

## 2022-04-16 DIAGNOSIS — R632 Polyphagia: Secondary | ICD-10-CM

## 2022-04-30 ENCOUNTER — Ambulatory Visit (INDEPENDENT_AMBULATORY_CARE_PROVIDER_SITE_OTHER): Payer: BC Managed Care – PPO | Admitting: Family Medicine

## 2022-04-30 ENCOUNTER — Encounter (INDEPENDENT_AMBULATORY_CARE_PROVIDER_SITE_OTHER): Payer: Self-pay | Admitting: Family Medicine

## 2022-04-30 VITALS — BP 116/75 | HR 87 | Temp 98.4°F | Ht 64.0 in | Wt 208.0 lb

## 2022-04-30 DIAGNOSIS — Z6835 Body mass index (BMI) 35.0-35.9, adult: Secondary | ICD-10-CM

## 2022-04-30 DIAGNOSIS — K5909 Other constipation: Secondary | ICD-10-CM | POA: Diagnosis not present

## 2022-04-30 DIAGNOSIS — E669 Obesity, unspecified: Secondary | ICD-10-CM

## 2022-04-30 DIAGNOSIS — I10 Essential (primary) hypertension: Secondary | ICD-10-CM

## 2022-04-30 DIAGNOSIS — E559 Vitamin D deficiency, unspecified: Secondary | ICD-10-CM

## 2022-04-30 DIAGNOSIS — Z6841 Body Mass Index (BMI) 40.0 and over, adult: Secondary | ICD-10-CM

## 2022-04-30 MED ORDER — WEGOVY 1.7 MG/0.75ML ~~LOC~~ SOAJ: 1.7000 mg | SUBCUTANEOUS | 0 refills | Status: DC

## 2022-04-30 MED ORDER — VITAMIN D (ERGOCALCIFEROL) 1.25 MG (50000 UNIT) PO CAPS: 50000.0000 [IU] | ORAL_CAPSULE | ORAL | 0 refills | Status: DC

## 2022-05-07 ENCOUNTER — Encounter (INDEPENDENT_AMBULATORY_CARE_PROVIDER_SITE_OTHER): Payer: Self-pay

## 2022-05-07 NOTE — Progress Notes (Signed)
Chief Complaint:   OBESITY Stacey Singh is here to discuss her progress with her obesity treatment plan along with follow-up of her obesity related diagnoses. Stacey Singh is on the Category 2 Plan, 400-500 calories + 35 grams of protein and states she is following her eating plan approximately 50% of the time. Stacey Singh states she is walking 20 minutes 3-4 times per week.  Today's visit was #: 15 Starting weight: 236 lbs Starting date: 08/12/2021 Today's weight: 208 lbs Today's date: 04/30/2022 Total lbs lost to date: 28 lbs Total lbs lost since last in-office visit: 15 lbs  Interim History: She is on Wegovy 1.7 mg weekly injection.  Improved portion control and reduced hunger on Wegovy.  Having dyspepsia, some nausea and constipation.  She is not on a bowel regimen.  Uses Zantac and Zofran as needed.  She is happy with 68 lb weight loss since January.  She has stopped Wellbutrin, has a stable mood.    Subjective:   1. Other constipation Constipation has been worsened by [facility].  She is not taking any regular bowel regimen.  She has increased water intake.  Eats a few fruits and veggies.    2. Essential hypertension Blood pressure is well controlled on Benicar 40 mg daily. She denies any side effects.   3. Vitamin D deficiency She is on prescription Vitamin D, 50,000 IU weekly. Energy level is improving.   Assessment/Plan:   1. Other constipation 1) Begin Miralax 17 grams daily, added to protein shake. 2) Increase intake of water, fruits and veggies.   2. Essential hypertension Continue Benicar 40 mg daily, plan to reduce dose in the next 1-2 months.   3. Vitamin D deficiency Refill - Vitamin D, Ergocalciferol, (DRISDOL) 1.25 MG (50000 UNIT) CAPS capsule; Take 1 capsule (50,000 Units total) by mouth every 7 (seven) days.  Dispense: 4 capsule; Refill: 0  4. Obesity,current BMI 35.7 Recommend supplementing intake with a fairlife protein shake daily, because of loss of muscle mass.    Refill - Semaglutide-Weight Management (WEGOVY) 1.7 MG/0.75ML SOAJ; Inject 1.7 mg into the skin once a week.  Dispense: 3 mL; Refill: 0  Stacey Singh is currently in the action stage of change. As such, her goal is to continue with weight loss efforts. She has agreed to the Category 2 Plan+ 500 calories and 35 protein at supper.   Exercise goals:  Increase exercise time to 30 minutes, 3 times per week.   Behavioral modification strategies: increasing lean protein intake, increasing vegetables, increasing water intake, no skipping meals, keeping healthy foods in the home, better snacking choices, and planning for success.  Stacey Singh has agreed to follow-up with our clinic in 2 weeks. She was informed of the importance of frequent follow-up visits to maximize her success with intensive lifestyle modifications for her multiple health conditions.   Objective:   Blood pressure 116/75, pulse 87, temperature 98.4 F (36.9 C), height '5\' 4"'$  (1.626 m), weight 208 lb (94.3 kg), SpO2 99 %. Body mass index is 35.7 kg/m.  General: Cooperative, alert, well developed, in no acute distress. HEENT: Conjunctivae and lids unremarkable. Cardiovascular: Regular rhythm.  Lungs: Normal work of breathing. Neurologic: No focal deficits.   Lab Results  Component Value Date   CREATININE 0.66 01/28/2022   BUN 8 01/28/2022   NA 137 01/28/2022   K 4.2 01/28/2022   CL 102 01/28/2022   CO2 21 01/28/2022   Lab Results  Component Value Date   ALT 17 01/28/2022   AST  23 01/28/2022   ALKPHOS 64 01/28/2022   BILITOT 0.4 01/28/2022   Lab Results  Component Value Date   HGBA1C 5.3 01/28/2022   HGBA1C 5.5 08/12/2021   HGBA1C 5.5 12/29/2017   Lab Results  Component Value Date   INSULIN 6.7 01/28/2022   INSULIN 10.1 08/12/2021   Lab Results  Component Value Date   TSH 2.190 08/12/2021   Lab Results  Component Value Date   CHOL 197 01/28/2022   HDL 51 01/28/2022   LDLCALC 133 (H) 01/28/2022   TRIG 73  01/28/2022   CHOLHDL 4.1 12/29/2017   Lab Results  Component Value Date   VD25OH 54.4 01/28/2022   VD25OH 36.0 08/12/2021   Lab Results  Component Value Date   WBC 10.0 05/20/2015   HGB 10.7 (L) 05/20/2015   HCT 32.0 (L) 05/20/2015   MCV 92.2 05/20/2015   PLT 179 05/20/2015   No results found for: "IRON", "TIBC", "FERRITIN"  Attestation Statements:   Reviewed by clinician on day of visit: allergies, medications, problem list, medical history, surgical history, family history, social history, and previous encounter notes.  I, Davy Pique, am acting as Location manager for Loyal Gambler, DO.  I have reviewed the above documentation for accuracy and completeness, and I agree with the above. Dell Ponto, DO

## 2022-05-15 ENCOUNTER — Other Ambulatory Visit (INDEPENDENT_AMBULATORY_CARE_PROVIDER_SITE_OTHER): Payer: Self-pay | Admitting: Family Medicine

## 2022-05-15 DIAGNOSIS — I10 Essential (primary) hypertension: Secondary | ICD-10-CM

## 2022-05-19 NOTE — Progress Notes (Unsigned)
TeleHealth Visit:  This visit was completed with telemedicine (audio/video) technology. Stacey Singh has verbally consented to this TeleHealth visit. The patient is located at home, the provider is located at home. The participants in this visit include the listed provider and patient. The visit was conducted today via MyChart video.  OBESITY Stacey Singh is here to discuss her progress with her obesity treatment plan along with follow-up of her obesity related diagnoses.   Today's visit was # 16 Starting weight: 236 lbs Starting date: 08/12/2021 Weight at last in office visit: 208 lbs on 04/30/22 Total weight loss: 28 lbs at last in office visit on 04/30/22. Today's reported weight: 206 to 207 lbs 3-4 days ago.   Nutrition Plan: Category 2 Plan+ 500 calories and 35 protein at supper. .   Current exercise:  walking 20 minutes 3-4 times per week.  Interim History: Stacey Singh was having nausea and diarrhea from [facility] a few weeks ago but this improved.  However last night she started having diarrhea and has had diarrhea numerous times over the past several hours.  Water intake is adequate.  She is also having acidic burps.  Injection of Wegovy 1.7 is due today.  She really does not want to stop taking it because it works so well.   She is eating a bland diet with plenty of chicken.  Assessment/Plan:  1.  Diarrhea Likely related to [facility].  We jumped her from 0.5 mg Wegovy to 1.7 mg Wegovy on August 31 due to unavailability of lower doses.  She had a good few weeks without nausea/diarrhea but now diarrhea has returned.  Wegovy injection due today. Also having acidic burps.  Plan: Start over-the-counter Imodium per package directions. Restart famotidine per package directions. May take Tums as needed. Discontinue MiraLAX for now. Continue adequate fluid intake, add in Powerade or Gatorade 0. Hold Wegovy until diarrhea subsides.  2. Hypertension Hypertension well controlled.  No signs/symptoms of  hypotension. Medication(s): Olmesartan 40 mg daily.  BP Readings from Last 3 Encounters:  04/30/22 116/75  03/05/22 111/75  01/28/22 120/83   Lab Results  Component Value Date   CREATININE 0.66 01/28/2022   CREATININE 0.55 (L) 08/12/2021   CREATININE 0.68 12/29/2017    Plan: Refill olmesartan 40 mg daily.   3. Vitamin D Deficiency Vitamin D is at goal of 50.  Last vitamin D level was 54.4 on 01/28/2022. She is on weekly prescription Vitamin D 50,000 IU.  Lab Results  Component Value Date   VD25OH 54.4 01/28/2022   VD25OH 36.0 08/12/2021    Plan: Refill prescription vitamin D 50,000 IU weekly.   4. Obesity: Current BMI 35.7  Refill Wegovy 1.7 mg weekly. Hold Wegovy until diarrhea subsides. Stacey Singh is currently in the action stage of change. As such, her goal is to continue with weight loss efforts.  She has agreed to practicing portion control and making smarter food choices, such as increasing vegetables and decreasing simple carbohydrates.   Continue bland diet until symptoms subside.  Exercise goals:  as is  Behavioral modification strategies: meal planning and cooking strategies and planning for success.  Stacey Singh has agreed to follow-up with our clinic in 3 weeks.   No orders of the defined types were placed in this encounter.   Medications Discontinued During This Encounter  Medication Reason   olmesartan (BENICAR) 40 MG tablet Reorder   Semaglutide-Weight Management (WEGOVY) 1.7 MG/0.75ML SOAJ Reorder   Vitamin D, Ergocalciferol, (DRISDOL) 1.25 MG (50000 UNIT) CAPS capsule Reorder  Meds ordered this encounter  Medications   olmesartan (BENICAR) 40 MG tablet    Sig: Take 1 tablet (40 mg total) by mouth daily.    Dispense:  90 tablet    Refill:  0    Order Specific Question:   Supervising Provider    Answer:   Dell Ponto [[id number]]   Semaglutide-Weight Management (WEGOVY) 1.7 MG/0.75ML SOAJ    Sig: Inject 1.7 mg into the skin once a week.     Dispense:  3 mL    Refill:  0    Order Specific Question:   Supervising Provider    Answer:   Netty Starring   Vitamin D, Ergocalciferol, (DRISDOL) 1.25 MG (50000 UNIT) CAPS capsule    Sig: Take 1 capsule (50,000 Units total) by mouth every 7 (seven) days.    Dispense:  4 capsule    Refill:  0    Order Specific Question:   Supervising Provider    Answer:   Dell Ponto [[id number]]      Objective:   VITALS: Per patient if applicable, see vitals. GENERAL: Alert and in no acute distress. CARDIOPULMONARY: No increased WOB. Speaking in clear sentences.  PSYCH: Pleasant and cooperative. Speech normal rate and rhythm. Affect is appropriate. Insight and judgement are appropriate. Attention is focused, linear, and appropriate.  NEURO: Oriented as arrived to appointment on time with no prompting.   Lab Results  Component Value Date   CREATININE 0.66 01/28/2022   BUN 8 01/28/2022   NA 137 01/28/2022   K 4.2 01/28/2022   CL 102 01/28/2022   CO2 21 01/28/2022   Lab Results  Component Value Date   ALT 17 01/28/2022   AST 23 01/28/2022   ALKPHOS 64 01/28/2022   BILITOT 0.4 01/28/2022   Lab Results  Component Value Date   HGBA1C 5.3 01/28/2022   HGBA1C 5.5 08/12/2021   HGBA1C 5.5 12/29/2017   Lab Results  Component Value Date   INSULIN 6.7 01/28/2022   INSULIN 10.1 08/12/2021   Lab Results  Component Value Date   TSH 2.190 08/12/2021   Lab Results  Component Value Date   CHOL 197 01/28/2022   HDL 51 01/28/2022   LDLCALC 133 (H) 01/28/2022   TRIG 73 01/28/2022   CHOLHDL 4.1 12/29/2017   Lab Results  Component Value Date   WBC 10.0 05/20/2015   HGB 10.7 (L) 05/20/2015   HCT 32.0 (L) 05/20/2015   MCV 92.2 05/20/2015   PLT 179 05/20/2015   No results found for: "IRON", "TIBC", "FERRITIN" Lab Results  Component Value Date   VD25OH 54.4 01/28/2022   VD25OH 36.0 08/12/2021    Attestation Statements:   Reviewed by clinician on day of visit: allergies,  medications, problem list, medical history, surgical history, family history, social history, and previous encounter notes.

## 2022-05-20 ENCOUNTER — Telehealth (INDEPENDENT_AMBULATORY_CARE_PROVIDER_SITE_OTHER): Payer: BC Managed Care – PPO | Admitting: Family Medicine

## 2022-05-20 ENCOUNTER — Encounter (INDEPENDENT_AMBULATORY_CARE_PROVIDER_SITE_OTHER): Payer: Self-pay | Admitting: Family Medicine

## 2022-05-20 DIAGNOSIS — R197 Diarrhea, unspecified: Secondary | ICD-10-CM | POA: Diagnosis not present

## 2022-05-20 DIAGNOSIS — I10 Essential (primary) hypertension: Secondary | ICD-10-CM | POA: Diagnosis not present

## 2022-05-20 DIAGNOSIS — E669 Obesity, unspecified: Secondary | ICD-10-CM

## 2022-05-20 DIAGNOSIS — Z6835 Body mass index (BMI) 35.0-35.9, adult: Secondary | ICD-10-CM

## 2022-05-20 DIAGNOSIS — E559 Vitamin D deficiency, unspecified: Secondary | ICD-10-CM

## 2022-05-20 DIAGNOSIS — E66813 Obesity, class 3: Secondary | ICD-10-CM

## 2022-05-20 MED ORDER — VITAMIN D (ERGOCALCIFEROL) 1.25 MG (50000 UNIT) PO CAPS: 50000.0000 [IU] | ORAL_CAPSULE | ORAL | 0 refills | Status: DC

## 2022-05-20 MED ORDER — WEGOVY 1.7 MG/0.75ML ~~LOC~~ SOAJ: 1.7000 mg | SUBCUTANEOUS | 0 refills | Status: DC

## 2022-05-20 MED ORDER — OLMESARTAN MEDOXOMIL 40 MG PO TABS: 40.0000 mg | ORAL_TABLET | Freq: Every day | ORAL | 0 refills | Status: DC

## 2022-06-09 ENCOUNTER — Ambulatory Visit (INDEPENDENT_AMBULATORY_CARE_PROVIDER_SITE_OTHER): Payer: BC Managed Care – PPO | Admitting: Family Medicine

## 2022-06-09 ENCOUNTER — Encounter (INDEPENDENT_AMBULATORY_CARE_PROVIDER_SITE_OTHER): Payer: Self-pay | Admitting: Family Medicine

## 2022-06-09 VITALS — BP 115/82 | HR 83 | Temp 98.1°F | Ht 64.0 in | Wt 196.0 lb

## 2022-06-09 DIAGNOSIS — R1013 Epigastric pain: Secondary | ICD-10-CM

## 2022-06-09 DIAGNOSIS — Z6833 Body mass index (BMI) 33.0-33.9, adult: Secondary | ICD-10-CM

## 2022-06-09 DIAGNOSIS — E669 Obesity, unspecified: Secondary | ICD-10-CM

## 2022-06-09 DIAGNOSIS — I10 Essential (primary) hypertension: Secondary | ICD-10-CM | POA: Diagnosis not present

## 2022-06-09 DIAGNOSIS — E559 Vitamin D deficiency, unspecified: Secondary | ICD-10-CM

## 2022-06-09 DIAGNOSIS — Z6841 Body Mass Index (BMI) 40.0 and over, adult: Secondary | ICD-10-CM

## 2022-06-09 MED ORDER — VITAMIN D (ERGOCALCIFEROL) 1.25 MG (50000 UNIT) PO CAPS: 50000.0000 [IU] | ORAL_CAPSULE | ORAL | 0 refills | Status: DC

## 2022-06-09 MED ORDER — OMEPRAZOLE 40 MG PO CPDR: 40.0000 mg | DELAYED_RELEASE_CAPSULE | Freq: Every day | ORAL | 0 refills | Status: DC

## 2022-06-09 MED ORDER — WEGOVY 1.7 MG/0.75ML ~~LOC~~ SOAJ: 1.7000 mg | SUBCUTANEOUS | 0 refills | Status: DC

## 2022-06-15 ENCOUNTER — Telehealth (INDEPENDENT_AMBULATORY_CARE_PROVIDER_SITE_OTHER): Payer: Self-pay | Admitting: *Deleted

## 2022-06-15 NOTE — Telephone Encounter (Signed)
Stacey Singh (Stacey Singh) Rx #: [id number] TTSVXB 1.'7MG'$ /0.75ML auto-injectors   Form [employer] Electronic PA Form [phone] NCPDP)  Prior authorization done via cover my meds for patients Wegovy. Waiting on determination.

## 2022-06-16 ENCOUNTER — Telehealth (INDEPENDENT_AMBULATORY_CARE_PROVIDER_SITE_OTHER): Payer: Self-pay | Admitting: Family Medicine

## 2022-06-16 ENCOUNTER — Encounter (INDEPENDENT_AMBULATORY_CARE_PROVIDER_SITE_OTHER): Payer: Self-pay | Admitting: *Deleted

## 2022-06-16 ENCOUNTER — Telehealth (INDEPENDENT_AMBULATORY_CARE_PROVIDER_SITE_OTHER): Payer: Self-pay | Admitting: *Deleted

## 2022-06-16 NOTE — Telephone Encounter (Signed)
Patient needs as new prior Auth on hee wegovy 1.'7mg'$ . Ardeen Fillers

## 2022-06-16 NOTE — Telephone Encounter (Signed)
Stacey Singh (Key: [id number]) Wegovy 1.'7MG'$ /0.75ML auto-injectors Did the appeal for patients Wegovy through cover my meds. Waiting on determination.

## 2022-06-19 NOTE — Progress Notes (Signed)
Chief Complaint:   OBESITY Stacey Singh is here to discuss her progress with her obesity treatment plan along with follow-up of her obesity related diagnoses. Stacey Singh is on the Category 2 Plan and keeping a food journal and adhering to recommended goals of 500 calories and 35 protein and states she is following her eating plan approximately 85% of the time. Stacey Singh states she is walking 15-20 minutes 3 times per week.  Today's visit was #: 57 Starting weight: 236 lbs Starting date: 08/12/2021 Today's weight: 196 lbs Today's date: 06/09/2022 Total lbs lost to date: 40 lbs Total lbs lost since last in-office visit: 12 lbs  Interim History: She is doing well on Wegovy 1.7 mg weekly.  She has had fewer episodes of nausea and vomiting from greasy foods.  Has net weight loss of 40 lbs since January. Has adequate satiety.  She has been lacking vegetable intake.   Subjective:   1. Dyspepsia Worsened by [facility].  She is taking Zantac OTC BID without relief.   2. Essential hypertension Recheck blood pressure at goal. She is taking Olmesartan 40 mg daily.  Denies adverse side effects   3. Vitamin D deficiency She is currently taking prescription vitamin D 50,000 IU each week. She denies nausea, vomiting or muscle weakness.  Assessment/Plan:   1. Dyspepsia Begin- omeprazole (PRILOSEC) 40 MG capsule; Take 1 capsule (40 mg total) by mouth daily.  Dispense: 30 capsule; Refill: 0  2. Essential hypertension Continue Olmesartan 40 mg daily.   3. Vitamin D deficiency Recheck level in January.    Refill - Vitamin D, Ergocalciferol, (DRISDOL) 1.25 MG (50000 UNIT) CAPS capsule; Take 1 capsule (50,000 Units total) by mouth every 7 (seven) days.  Dispense: 4 capsule; Refill: 0  4. Obesity,current BMI 33.7 1) Plan to ramp up walking time.   2) Add in cooked veggies.   3) Avoid greasy foods.   Refill - Semaglutide-Weight Management (WEGOVY) 1.7 MG/0.75ML SOAJ; Inject 1.7 mg into the skin once a week.   Dispense: 3 mL; Refill: 0  Stacey Singh is currently in the action stage of change. As such, her goal is to continue with weight loss efforts. She has agreed to the Category 2 Plan.   Exercise goals:  Increase walking to 30 minutes at least 3 times per week.   Behavioral modification strategies: increasing lean protein intake, increasing vegetables, increasing water intake, decreasing eating out, meal planning and cooking strategies, and planning for success.  Stacey Singh has agreed to follow-up with our clinic in 4 weeks. She was informed of the importance of frequent follow-up visits to maximize her success with intensive lifestyle modifications for her multiple health conditions.   Objective:   Blood pressure 115/82, pulse 83, temperature 98.1 F (36.7 C), height '5\' 4"'$  (1.626 m), weight 196 lb (88.9 kg), SpO2 98 %. Body mass index is 33.64 kg/m.  General: Cooperative, alert, well developed, in no acute distress. HEENT: Conjunctivae and lids unremarkable. Cardiovascular: Regular rhythm.  Lungs: Normal work of breathing. Neurologic: No focal deficits.   Lab Results  Component Value Date   CREATININE 0.66 01/28/2022   BUN 8 01/28/2022   NA 137 01/28/2022   K 4.2 01/28/2022   CL 102 01/28/2022   CO2 21 01/28/2022   Lab Results  Component Value Date   ALT 17 01/28/2022   AST 23 01/28/2022   ALKPHOS 64 01/28/2022   BILITOT 0.4 01/28/2022   Lab Results  Component Value Date   HGBA1C 5.3 01/28/2022  HGBA1C 5.5 08/12/2021   HGBA1C 5.5 12/29/2017   Lab Results  Component Value Date   INSULIN 6.7 01/28/2022   INSULIN 10.1 08/12/2021   Lab Results  Component Value Date   TSH 2.190 08/12/2021   Lab Results  Component Value Date   CHOL 197 01/28/2022   HDL 51 01/28/2022   LDLCALC 133 (H) 01/28/2022   TRIG 73 01/28/2022   CHOLHDL 4.1 12/29/2017   Lab Results  Component Value Date   VD25OH 54.4 01/28/2022   VD25OH 36.0 08/12/2021   Lab Results  Component Value Date   WBC  10.0 05/20/2015   HGB 10.7 (L) 05/20/2015   HCT 32.0 (L) 05/20/2015   MCV 92.2 05/20/2015   PLT 179 05/20/2015   No results found for: "IRON", "TIBC", "FERRITIN"  Attestation Statements:   Reviewed by clinician on day of visit: allergies, medications, problem list, medical history, surgical history, family history, social history, and previous encounter notes.  I, Davy Pique, am acting as Location manager for Loyal Gambler, DO.  I have reviewed the above documentation for accuracy and completeness, and I agree with the above. Dell Ponto, DO

## 2022-06-24 ENCOUNTER — Telehealth (INDEPENDENT_AMBULATORY_CARE_PROVIDER_SITE_OTHER): Payer: Self-pay | Admitting: *Deleted

## 2022-06-24 NOTE — Telephone Encounter (Signed)
Patients Prior authorization is approved for her Wegovy. 06/23/22-06/24/23. Patient has been notified.

## 2022-06-29 NOTE — Progress Notes (Unsigned)
TeleHealth Visit:  This visit was completed with telemedicine (audio/video) technology. Stacey Singh has verbally consented to this TeleHealth visit. The patient is located at home, the provider is located at home. The participants in this visit include the listed provider and patient. The visit was conducted today via MyChart video.  OBESITY Stacey Singh is here to discuss her progress with her obesity treatment plan along with follow-up of her obesity related diagnoses.   Today's visit was # 18 Starting weight: 236 lbs Starting date: 08/12/2021 Weight at last in office visit: 196 lbs on 06/09/22 Total weight loss: 40 lbs at last in office visit on 06/09/22. Today's reported weight: 198 lbs   Nutrition Plan: the Category 2 Plan  Current exercise: walking 15-20 minutes 3 times per week.  Interim History: GI issues are much better controlled.  She has been avoiding fatty foods. Appetite and cravings are controlled with Wegovy 1.7 mg weekly.  She is getting the prescribed protein at lunch and dinner (4 ounces at lunch and 6 ounces at dinner).  Generally has some dry cereal at breakfast.  Being on the [facility] has changed her taste for some foods.  Used to do shakes or cottage cheese but no longer wants them.  She has slightly increased vegetable intake.  She has been cautious because of GI issues.   Assessment/Plan:  1. Nausea Takes Zofran the first 2 days after her Wegovy injection and nausea is well controlled.  Plan: Refill Zofran 8 mg every 8 hours as needed for nausea and vomiting.  2.  Dyspepsia Reports biggest issue is with the Mancel Parsons is GERD.  This is much better controlled with the omeprazole.  Plan: Refill omeprazole 40 mg daily.  3. Obesity: Current BMI 33.7 Stacey Singh is currently in the action stage of change. As such, her goal is to continue with weight loss efforts.  She has agreed to the Category 2 Plan.   Category 2 breakfast options sent via MyChart.  Try the special K  protein cereal with fair life milk. Continue to avoid fatty foods. Continue to work on increasing vegetables.  Exercise goals: as is  Behavioral modification strategies: increasing lean protein intake, decreasing simple carbohydrates, increasing vegetables, meal planning and cooking strategies, and planning for success.  Stacey Singh has agreed to follow-up with our clinic in 4 weeks.   No orders of the defined types were placed in this encounter.   Medications Discontinued During This Encounter  Medication Reason   ondansetron (ZOFRAN) 8 MG tablet Reorder   Semaglutide-Weight Management (WEGOVY) 1.7 MG/0.75ML SOAJ Reorder   omeprazole (PRILOSEC) 40 MG capsule Reorder     Meds ordered this encounter  Medications   Semaglutide-Weight Management (WEGOVY) 1.7 MG/0.75ML SOAJ    Sig: Inject 1.7 mg into the skin once a week.    Dispense:  3 mL    Refill:  0    Order Specific Question:   Supervising Provider    Answer:   Loyal Gambler E [[id number]]   ondansetron (ZOFRAN) 8 MG tablet    Sig: Take 1 tablet (8 mg total) by mouth every 8 (eight) hours as needed for nausea or vomiting.    Dispense:  20 tablet    Refill:  0    Order Specific Question:   Supervising Provider    Answer:   Dell Ponto [[id number]]   omeprazole (PRILOSEC) 40 MG capsule    Sig: Take 1 capsule (40 mg total) by mouth daily.    Dispense:  30 capsule  Refill:  0    Order Specific Question:   Supervising Provider    Answer:   Dell Ponto [[id number]]      Objective:   VITALS: Per patient if applicable, see vitals. GENERAL: Alert and in no acute distress. CARDIOPULMONARY: No increased WOB. Speaking in clear sentences.  PSYCH: Pleasant and cooperative. Speech normal rate and rhythm. Affect is appropriate. Insight and judgement are appropriate. Attention is focused, linear, and appropriate.  NEURO: Oriented as arrived to appointment on time with no prompting.   Lab Results  Component Value Date   CREATININE 0.66 01/28/2022    BUN 8 01/28/2022   NA 137 01/28/2022   K 4.2 01/28/2022   CL 102 01/28/2022   CO2 21 01/28/2022   Lab Results  Component Value Date   ALT 17 01/28/2022   AST 23 01/28/2022   ALKPHOS 64 01/28/2022   BILITOT 0.4 01/28/2022   Lab Results  Component Value Date   HGBA1C 5.3 01/28/2022   HGBA1C 5.5 08/12/2021   HGBA1C 5.5 12/29/2017   Lab Results  Component Value Date   INSULIN 6.7 01/28/2022   INSULIN 10.1 08/12/2021   Lab Results  Component Value Date   TSH 2.190 08/12/2021   Lab Results  Component Value Date   CHOL 197 01/28/2022   HDL 51 01/28/2022   LDLCALC 133 (H) 01/28/2022   TRIG 73 01/28/2022   CHOLHDL 4.1 12/29/2017   Lab Results  Component Value Date   WBC 10.0 05/20/2015   HGB 10.7 (L) 05/20/2015   HCT 32.0 (L) 05/20/2015   MCV 92.2 05/20/2015   PLT 179 05/20/2015   No results found for: "IRON", "TIBC", "FERRITIN" Lab Results  Component Value Date   VD25OH 54.4 01/28/2022   VD25OH 36.0 08/12/2021    Attestation Statements:   Reviewed by clinician on day of visit: allergies, medications, problem list, medical history, surgical history, family history, social history, and previous encounter notes.

## 2022-06-30 ENCOUNTER — Encounter (INDEPENDENT_AMBULATORY_CARE_PROVIDER_SITE_OTHER): Payer: Self-pay | Admitting: Family Medicine

## 2022-06-30 ENCOUNTER — Telehealth (INDEPENDENT_AMBULATORY_CARE_PROVIDER_SITE_OTHER): Payer: BC Managed Care – PPO | Admitting: Family Medicine

## 2022-06-30 DIAGNOSIS — Z6833 Body mass index (BMI) 33.0-33.9, adult: Secondary | ICD-10-CM

## 2022-06-30 DIAGNOSIS — R110 Nausea: Secondary | ICD-10-CM

## 2022-06-30 DIAGNOSIS — E669 Obesity, unspecified: Secondary | ICD-10-CM | POA: Diagnosis not present

## 2022-06-30 DIAGNOSIS — R1013 Epigastric pain: Secondary | ICD-10-CM

## 2022-06-30 DIAGNOSIS — Z6841 Body Mass Index (BMI) 40.0 and over, adult: Secondary | ICD-10-CM

## 2022-06-30 MED ORDER — WEGOVY 1.7 MG/0.75ML ~~LOC~~ SOAJ: 1.7000 mg | SUBCUTANEOUS | 0 refills | Status: DC

## 2022-06-30 MED ORDER — OMEPRAZOLE 40 MG PO CPDR: 40.0000 mg | DELAYED_RELEASE_CAPSULE | Freq: Every day | ORAL | 0 refills | Status: DC

## 2022-06-30 MED ORDER — ONDANSETRON HCL 8 MG PO TABS: 8.0000 mg | ORAL_TABLET | Freq: Three times a day (TID) | ORAL | 0 refills | Status: DC | PRN

## 2022-07-28 ENCOUNTER — Ambulatory Visit (INDEPENDENT_AMBULATORY_CARE_PROVIDER_SITE_OTHER): Payer: BC Managed Care – PPO | Admitting: Family Medicine

## 2022-07-28 ENCOUNTER — Encounter (INDEPENDENT_AMBULATORY_CARE_PROVIDER_SITE_OTHER): Payer: Self-pay | Admitting: Family Medicine

## 2022-07-28 VITALS — BP 116/81 | HR 82 | Temp 97.8°F | Ht 64.0 in | Wt 191.0 lb

## 2022-07-28 DIAGNOSIS — R632 Polyphagia: Secondary | ICD-10-CM

## 2022-07-28 DIAGNOSIS — R110 Nausea: Secondary | ICD-10-CM

## 2022-07-28 DIAGNOSIS — I10 Essential (primary) hypertension: Secondary | ICD-10-CM

## 2022-07-28 DIAGNOSIS — Z6832 Body mass index (BMI) 32.0-32.9, adult: Secondary | ICD-10-CM

## 2022-07-28 DIAGNOSIS — R1013 Epigastric pain: Secondary | ICD-10-CM

## 2022-07-28 DIAGNOSIS — E669 Obesity, unspecified: Secondary | ICD-10-CM

## 2022-07-28 DIAGNOSIS — E559 Vitamin D deficiency, unspecified: Secondary | ICD-10-CM | POA: Diagnosis not present

## 2022-07-28 MED ORDER — OLMESARTAN MEDOXOMIL 40 MG PO TABS: 40.0000 mg | ORAL_TABLET | Freq: Every day | ORAL | 0 refills | Status: DC

## 2022-07-28 MED ORDER — OMEPRAZOLE 40 MG PO CPDR: 40.0000 mg | DELAYED_RELEASE_CAPSULE | Freq: Every day | ORAL | 0 refills | Status: DC

## 2022-07-28 MED ORDER — VITAMIN D (ERGOCALCIFEROL) 1.25 MG (50000 UNIT) PO CAPS: 50000.0000 [IU] | ORAL_CAPSULE | ORAL | 0 refills | Status: DC

## 2022-07-28 MED ORDER — WEGOVY 1.7 MG/0.75ML ~~LOC~~ SOAJ: 1.7000 mg | SUBCUTANEOUS | 0 refills | Status: DC

## 2022-07-30 NOTE — Progress Notes (Signed)
Chief Complaint:   OBESITY Stacey Singh is here to discuss her progress with her obesity treatment plan along with follow-up of her obesity related diagnoses. Stacey Singh is on the Category 2 Plan and states she is following her eating plan approximately 80% of the time. Stacey Singh states she is walking 30-45 minutes 3 times per week.  Today's visit was #: 35 Starting weight: 236 lbs Starting date: 08/12/2021 Today's weight: 191 lbs Today's date: 07/29/2022 Total lbs lost to date: 45 lbs Total lbs lost since last in-office visit: 5 lbs  Interim History: Patient enjoys improved satiety with less GI side effects on Wegovy 1.7 mg weekly.  Patient is walking 3 times a week outdoors.  Family is supportive.  Patient has cut out alcohol and reduced soda intake.  Has been eating special K protein cereal for breakfast.  Patient stayed mindful over the holidays.  Subjective:   1. Polyphagia Patient is improving on Wegovy 1.7 mg weekly.  Patient has stopped Wellbutrin without any changes in cravings or emotional eating.  2. Vitamin D deficiency Patient is on prescription vitamin D 50,000 IU weekly, but forgets to take it weekly.  3. Dyspepsia Due to [facility] use, but improving on omeprazole 40 mg daily and dietary changes.  4. Nausea Improving with dietary changes on Wegovy.  Patient has Zofran for as needed use.  5. Essential hypertension Blood pressure well-controlled on olmesartan 40 mg daily.  Patient denies adverse side effects.  Assessment/Plan:   1. Polyphagia Refill- Semaglutide-Weight Management (WEGOVY) 1.7 MG/0.75ML SOAJ; Inject 1.7 mg into the skin once a week.  Dispense: 3 mL; Refill: 0  2. Vitamin D deficiency Recheck vitamin D level today.  Refill- Vitamin D, Ergocalciferol, (DRISDOL) 1.25 MG (50000 UNIT) CAPS capsule; Take 1 capsule (50,000 Units total) by mouth every 7 (seven) days.  Dispense: 5 capsule; Refill: 0  - Vitamin D, 25-Hydroxy, Total  3. Dyspepsia Refill- omeprazole  (PRILOSEC) 40 MG capsule; Take 1 capsule (40 mg total) by mouth daily.  Dispense: 30 capsule; Refill: 0  4. Nausea Use Zofran 8 mg every 8 hours as needed for nausea.  5. Essential hypertension Refill- olmesartan (BENICAR) 40 MG tablet; Take 1 tablet (40 mg total) by mouth daily.  Dispense: 90 tablet; Refill: 0  Check labs today.  - TSH Rfx on Abnormal to Free T4 - Lipid panel - Comprehensive metabolic panel  6. Obesity,current BMI 32.9 Increase water intake.  Refill- Semaglutide-Weight Management (WEGOVY) 1.7 MG/0.75ML SOAJ; Inject 1.7 mg into the skin once a week.  Dispense: 3 mL; Refill: 0  Stacey Singh is currently in the action stage of change. As such, her goal is to continue with weight loss efforts. She has agreed to the Category 2 Plan.   Exercise goals:  As is.  Behavioral modification strategies: increasing lean protein intake, increasing water intake, meal planning and cooking strategies, keeping healthy foods in the home, better snacking choices, and planning for success.  Stacey Singh has agreed to follow-up with our clinic in 3 weeks. She was informed of the importance of frequent follow-up visits to maximize her success with intensive lifestyle modifications for her multiple health conditions.   Stacey Singh was informed we would discuss her lab results at her next visit unless there is a critical issue that needs to be addressed sooner. Stacey Singh agreed to keep her next visit at the agreed upon time to discuss these results.  Objective:   Blood pressure 116/81, pulse 82, temperature 97.8 F (36.6 C), height '5\' 4"'$  (  1.626 m), weight 191 lb (86.6 kg), SpO2 98 %. Body mass index is 32.79 kg/m.  General: Cooperative, alert, well developed, in no acute distress. HEENT: Conjunctivae and lids unremarkable. Cardiovascular: Regular rhythm.  Lungs: Normal work of breathing. Neurologic: No focal deficits.   Lab Results  Component Value Date   CREATININE 0.67 07/28/2022   BUN 5 (L) 07/28/2022    NA 142 07/28/2022   K 4.2 07/28/2022   CL 106 07/28/2022   CO2 22 07/28/2022   Lab Results  Component Value Date   ALT 10 07/28/2022   AST 15 07/28/2022   ALKPHOS 57 07/28/2022   BILITOT <0.2 07/28/2022   Lab Results  Component Value Date   HGBA1C 5.3 01/28/2022   HGBA1C 5.5 08/12/2021   HGBA1C 5.5 12/29/2017   Lab Results  Component Value Date   INSULIN 6.7 01/28/2022   INSULIN 10.1 08/12/2021   Lab Results  Component Value Date   TSH 4.320 07/28/2022   Lab Results  Component Value Date   CHOL 157 07/28/2022   HDL 39 (L) 07/28/2022   LDLCALC 105 (H) 07/28/2022   TRIG 68 07/28/2022   CHOLHDL 4.0 07/28/2022   Lab Results  Component Value Date   VD25OH 54.4 01/28/2022   VD25OH 36.0 08/12/2021   Lab Results  Component Value Date   WBC 10.0 05/20/2015   HGB 10.7 (L) 05/20/2015   HCT 32.0 (L) 05/20/2015   MCV 92.2 05/20/2015   PLT 179 05/20/2015   No results found for: "IRON", "TIBC", "FERRITIN"  Attestation Statements:   Reviewed by clinician on day of visit: allergies, medications, problem list, medical history, surgical history, family history, social history, and previous encounter notes.  I, Davy Pique, am acting as Location manager for Loyal Gambler, DO.  I have reviewed the above documentation for accuracy and completeness, and I agree with the above. Dell Ponto, DO

## 2022-08-02 LAB — COMPREHENSIVE METABOLIC PANEL
ALT: 10 IU/L (ref 0–32)
AST: 15 IU/L (ref 0–40)
Albumin/Globulin Ratio: 1.6 (ref 1.2–2.2)
Albumin: 3.8 g/dL — ABNORMAL LOW (ref 3.9–4.9)
Alkaline Phosphatase: 57 IU/L (ref 44–121)
BUN/Creatinine Ratio: 7 — ABNORMAL LOW (ref 9–23)
BUN: 5 mg/dL — ABNORMAL LOW (ref 6–24)
Bilirubin Total: <0.2 mg/dL (ref 0.0–1.2)
CO2: 22 mmol/L (ref 20–29)
Calcium: 8.5 mg/dL — ABNORMAL LOW (ref 8.7–10.2)
Chloride: 106 mmol/L (ref 96–106)
Creatinine, Ser: 0.67 mg/dL (ref 0.57–1.00)
Globulin, Total: 2.4 g/dL (ref 1.5–4.5)
Glucose: 93 mg/dL (ref 70–99)
Potassium: 4.2 mmol/L (ref 3.5–5.2)
Sodium: 142 mmol/L (ref 134–144)
Total Protein: 6.2 g/dL (ref 6.0–8.5)
eGFR: 109 mL/min/{1.73_m2} (ref >59)

## 2022-08-02 LAB — LIPID PANEL
Chol/HDL Ratio: 4 ratio (ref 0.0–4.4)
Cholesterol, Total: 157 mg/dL (ref 100–199)
HDL: 39 mg/dL — ABNORMAL LOW (ref >39)
LDL Chol Calc (NIH): 105 mg/dL — ABNORMAL HIGH (ref 0–99)
Triglycerides: 68 mg/dL (ref 0–149)
VLDL Cholesterol Cal: 13 mg/dL (ref 5–40)

## 2022-08-02 LAB — TSH RFX ON ABNORMAL TO FREE T4: TSH: 4.32 u[IU]/mL (ref 0.450–4.500)

## 2022-08-02 LAB — VITAMIN D, 25-HYDROXY, TOTAL: Vitamin D, 25-Hydroxy, Serum: 43 ng/mL

## 2022-08-17 NOTE — Progress Notes (Unsigned)
TeleHealth Visit:  This visit was completed with telemedicine (audio/video) technology. Stacey Singh has verbally consented to this TeleHealth visit. The patient is located at home, the provider is located at home. The participants in this visit include the listed provider and patient. The visit was conducted today via MyChart video.  OBESITY Stacey Singh is here to discuss her progress with her obesity treatment plan along with follow-up of her obesity related diagnoses.   Today's visit was # 20 Starting weight: 236 lbs Starting date: 08/12/2021 Weight at last in office visit: 191 lbs on 07/29/22 Total weight loss: 45 lbs at last in office visit on 07/29/22. Today's reported weight: 190-191 lbs   Nutrition Plan: the Category 2 Plan.   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  Interim History:  She has been deviating some from the plan-for instance having chicken salad for lunch..  She reports her weight has remained the same since her in office visit on January 3.  Cravings have increased and she is snacking at work. She tends to remain full for a while after meals which sometimes leads to skipping meals.  She is currently on Wegovy 1.7 mg weekly.  Provides good appetite suppression and satiety.  Has only recently been able to tolerate it without nausea.   She is no longer needing to take Zofran.  She also has issues with GERD.  Continues to take Prilosec 40 mg daily.  Assessment/Plan:  We discussed recent lab results in depth.  1. Eating disorder/emotional eating Notes increased cravings recently, especially around her period.  She would like to restart bupropion.  Plan: Restart bupropion 150 mg daily.  She has some left over so no refill needed.  2. Vitamin D Deficiency Vitamin D level has dropped from 54 to 43.  She admits that she forgets to take it. She is on weekly prescription Vitamin D 50,000 IU.  Lab Results  Component Value Date    VD25OH 54.4 01/28/2022   VD25OH 36.0 08/12/2021    Plan: Refill prescription vitamin D 50,000 IU weekly. Discussed strategy for better compliance.  She will keep the vitamin D at work in her desk drawer and take on Monday morning.   3. Hyperlipidemia LDL is nearly at goal.  LDL decreased from 133 on 01/29/2019 3-1 05 on 07/28/2022.  HDL low at 39.  Triglycerides normal at 68. Medication(s): none  Lab Results  Component Value Date   CHOL 157 07/28/2022   HDL 39 (L) 07/28/2022   LDLCALC 105 (H) 07/28/2022   TRIG 68 07/28/2022   CHOLHDL 4.0 07/28/2022   Lab Results  Component Value Date   ALT 10 07/28/2022   AST 15 07/28/2022   ALKPHOS 57 07/28/2022   BILITOT <0.2 07/28/2022   The 10-year ASCVD risk score (Arnett DK, et al., 2019) is: 1.1%   Values used to calculate the score:     Age: 47 years     Sex: Female     Is Non-Hispanic African American: No     Diabetic: No     Tobacco smoker: No     Systolic Blood Pressure: 003 mmHg     Is BP treated: Yes     HDL Cholesterol: 39 mg/dL     Total Cholesterol: 157 mg/dL  Plan: Continue to follow the meal plan. Increase vigorous exercise.   4. Obesity: Current BMI 32.8 Stacey Singh is currently in the action stage of change. As such, her goal is to continue with  weight loss efforts.  She has agreed to the Category 2 Plan and keeping a food journal and adhering to recommended goals of 1200-1300 calories and 80 gms protein.   1.  Encouraged her to start journaling intake since weight has plateaued the last 3 weeks. 2.  Provided ideas for sweet but healthy snacks.  Exercise goals:  as is  Behavioral modification strategies: increasing lean protein intake, decreasing simple carbohydrates, meal planning and cooking strategies, and better snacking choices.  Stacey Singh has agreed to follow-up with our clinic in 2 weeks.   No orders of the defined types were placed in this encounter.   Medications Discontinued During This Encounter   Medication Reason   Vitamin D, Ergocalciferol, (DRISDOL) 1.25 MG (50000 UNIT) CAPS capsule Reorder   Semaglutide-Weight Management (WEGOVY) 1.7 MG/0.75ML SOAJ Reorder     Meds ordered this encounter  Medications   Vitamin D, Ergocalciferol, (DRISDOL) 1.25 MG (50000 UNIT) CAPS capsule    Sig: Take 1 capsule (50,000 Units total) by mouth every 7 (seven) days.    Dispense:  5 capsule    Refill:  0    Order Specific Question:   Supervising Provider    Answer:   Dell Ponto [[id number]]   Semaglutide-Weight Management (WEGOVY) 1.7 MG/0.75ML SOAJ    Sig: Inject 1.7 mg into the skin once a week.    Dispense:  3 mL    Refill:  0    Order Specific Question:   Supervising Provider    Answer:   Dell Ponto [[id number]]      Objective:   VITALS: Per patient if applicable, see vitals. GENERAL: Alert and in no acute distress. CARDIOPULMONARY: No increased WOB. Speaking in clear sentences.  PSYCH: Pleasant and cooperative. Speech normal rate and rhythm. Affect is appropriate. Insight and judgement are appropriate. Attention is focused, linear, and appropriate.  NEURO: Oriented as arrived to appointment on time with no prompting.   Lab Results  Component Value Date   CREATININE 0.67 07/28/2022   BUN 5 (L) 07/28/2022   NA 142 07/28/2022   K 4.2 07/28/2022   CL 106 07/28/2022   CO2 22 07/28/2022   Lab Results  Component Value Date   ALT 10 07/28/2022   AST 15 07/28/2022   ALKPHOS 57 07/28/2022   BILITOT <0.2 07/28/2022   Lab Results  Component Value Date   HGBA1C 5.3 01/28/2022   HGBA1C 5.5 08/12/2021   HGBA1C 5.5 12/29/2017   Lab Results  Component Value Date   INSULIN 6.7 01/28/2022   INSULIN 10.1 08/12/2021   Lab Results  Component Value Date   TSH 4.320 07/28/2022   Lab Results  Component Value Date   CHOL 157 07/28/2022   HDL 39 (L) 07/28/2022   LDLCALC 105 (H) 07/28/2022   TRIG 68 07/28/2022   CHOLHDL 4.0 07/28/2022   Lab Results  Component Value Date   WBC 10.0  05/20/2015   HGB 10.7 (L) 05/20/2015   HCT 32.0 (L) 05/20/2015   MCV 92.2 05/20/2015   PLT 179 05/20/2015   No results found for: "IRON", "TIBC", "FERRITIN" Lab Results  Component Value Date   VD25OH 54.4 01/28/2022   VD25OH 36.0 08/12/2021    Attestation Statements:   Reviewed by clinician on day of visit: allergies, medications, problem list, medical history, surgical history, family history, social history, and previous encounter notes.

## 2022-08-19 ENCOUNTER — Encounter (INDEPENDENT_AMBULATORY_CARE_PROVIDER_SITE_OTHER): Payer: Self-pay | Admitting: Family Medicine

## 2022-08-19 ENCOUNTER — Telehealth (INDEPENDENT_AMBULATORY_CARE_PROVIDER_SITE_OTHER): Payer: BC Managed Care – PPO | Admitting: Family Medicine

## 2022-08-19 DIAGNOSIS — F5089 Other specified eating disorder: Secondary | ICD-10-CM

## 2022-08-19 DIAGNOSIS — E559 Vitamin D deficiency, unspecified: Secondary | ICD-10-CM

## 2022-08-19 DIAGNOSIS — E7849 Other hyperlipidemia: Secondary | ICD-10-CM | POA: Diagnosis not present

## 2022-08-19 DIAGNOSIS — E669 Obesity, unspecified: Secondary | ICD-10-CM | POA: Diagnosis not present

## 2022-08-19 DIAGNOSIS — Z6832 Body mass index (BMI) 32.0-32.9, adult: Secondary | ICD-10-CM

## 2022-08-19 MED ORDER — WEGOVY 1.7 MG/0.75ML ~~LOC~~ SOAJ: 1.7000 mg | SUBCUTANEOUS | 0 refills | Status: DC

## 2022-08-19 MED ORDER — VITAMIN D (ERGOCALCIFEROL) 1.25 MG (50000 UNIT) PO CAPS: 50000.0000 [IU] | ORAL_CAPSULE | ORAL | 0 refills | Status: DC

## 2022-08-26 ENCOUNTER — Other Ambulatory Visit (INDEPENDENT_AMBULATORY_CARE_PROVIDER_SITE_OTHER): Payer: Self-pay | Admitting: Family Medicine

## 2022-08-26 DIAGNOSIS — R1013 Epigastric pain: Secondary | ICD-10-CM

## 2022-09-03 ENCOUNTER — Ambulatory Visit (INDEPENDENT_AMBULATORY_CARE_PROVIDER_SITE_OTHER): Payer: BC Managed Care – PPO | Admitting: Family Medicine

## 2022-09-03 ENCOUNTER — Encounter (INDEPENDENT_AMBULATORY_CARE_PROVIDER_SITE_OTHER): Payer: Self-pay | Admitting: Family Medicine

## 2022-09-03 VITALS — BP 119/82 | HR 82 | Temp 99.0°F | Ht 64.0 in | Wt 183.0 lb

## 2022-09-03 DIAGNOSIS — Z6832 Body mass index (BMI) 32.0-32.9, adult: Secondary | ICD-10-CM

## 2022-09-03 DIAGNOSIS — F3289 Other specified depressive episodes: Secondary | ICD-10-CM

## 2022-09-03 DIAGNOSIS — E559 Vitamin D deficiency, unspecified: Secondary | ICD-10-CM | POA: Diagnosis not present

## 2022-09-03 DIAGNOSIS — R632 Polyphagia: Secondary | ICD-10-CM | POA: Diagnosis not present

## 2022-09-03 MED ORDER — OMEPRAZOLE 40 MG PO CPDR: 40.0000 mg | DELAYED_RELEASE_CAPSULE | Freq: Every day | ORAL | 0 refills | Status: DC

## 2022-09-03 MED ORDER — WEGOVY 1.7 MG/0.75ML ~~LOC~~ SOAJ: 1.7000 mg | SUBCUTANEOUS | 0 refills | Status: DC

## 2022-09-03 MED ORDER — BUPROPION HCL ER (SR) 150 MG PO TB12: 150.0000 mg | ORAL_TABLET | Freq: Every day | ORAL | 0 refills | Status: DC

## 2022-09-03 MED ORDER — VITAMIN D (ERGOCALCIFEROL) 1.25 MG (50000 UNIT) PO CAPS: 50000.0000 [IU] | ORAL_CAPSULE | ORAL | 0 refills | Status: DC

## 2022-09-22 NOTE — Progress Notes (Signed)
Chief Complaint:   OBESITY Stacey Singh is here to discuss her progress with her obesity treatment plan along with follow-up of her obesity related diagnoses. Stacey Singh is on the Category 2 Plan and keeping a food journal and adhering to recommended goals of 1200-1300 calories and 80 protein and states she is following her eating plan approximately 85% of the time. Stacey Singh states she is walking 15-30 minutes 3 times per week.  Today's visit was #: 21 Starting weight: 236 lbs Starting date: 08/12/2021 Today's weight: 183 lbs Today's date: 09/03/2022 Total lbs lost to date: 53 lbs Total lbs lost since last in-office visit: 8 lbs  Interim History: Patient is limiting intake of chocolate.  Patient restarted bupropion 150 mg once a day which is helping with cravings.  She has not added in weight training.  She is lacking adequate protein at meals due to satiety on Wegovy 1.7 mg weekly.  Patient is down 5.6 LB and muscle mass, down 1.8 LB body fat in the past month.  Subjective:   1. Polyphagia Improving on Wegovy 1.7 mg weekly.  Rarely uses Zofran.  Reflux is improving on omeprazole 40 mg daily.  2. Vitamin D deficiency Vitamin D level 54 down to 43 due to poor adherence of vitamin D prescription 50,000 IU weekly.  3. Other depression with emotional eating Patient is improving on bupropion 150 mg daily.  Patient is practicing mindful eating.  Patient's mood has improved.  Assessment/Plan:   1. Polyphagia Refilled omeprazole to combat reflux due to [facility].  Refill- omeprazole (PRILOSEC) 40 MG capsule; Take 1 capsule (40 mg total) by mouth daily.  Dispense: 30 capsule; Refill: 0  Refill- Semaglutide-Weight Management (WEGOVY) 1.7 MG/0.75ML SOAJ; Inject 1.7 mg into the skin once a week.  Dispense: 3 mL; Refill: 0  2. Vitamin D deficiency Patient encouraged to improve compliance taking prescription vitamin D weekly.  Refill- Vitamin D, Ergocalciferol, (DRISDOL) 1.25 MG (50000 UNIT) CAPS  capsule; Take 1 capsule (50,000 Units total) by mouth every 7 (seven) days.  Dispense: 5 capsule; Refill: 0  3. Other depression with emotional eating Refill- buPROPion (WELLBUTRIN SR) 150 MG 12 hr tablet; Take 1 tablet (150 mg total) by mouth daily.  Dispense: 30 tablet; Refill: 0  4. Morbid obesity (HCC) Refill- Semaglutide-Weight Management (WEGOVY) 1.7 MG/0.75ML SOAJ; Inject 1.7 mg into the skin once a week.  Dispense: 3 mL; Refill: 0  5. Obesity,current BMI 32.8 1.  Adding a protein shake daily with 30 g of protein. 2.  Adding weight training 10 to 15 minutes 3 times a week  Refill- Semaglutide-Weight Management (WEGOVY) 1.7 MG/0.75ML SOAJ; Inject 1.7 mg into the skin once a week.  Dispense: 3 mL; Refill: 0  [place] is currently in the action stage of change. As such, her goal is to continue with weight loss efforts. She has agreed to the Category 2 Plan.   Exercise goals:  See above.  Behavioral modification strategies: increasing lean protein intake, increasing vegetables, increasing water intake, no skipping meals, keeping healthy foods in the home, better snacking choices, avoiding temptations, and planning for success.  Stephanie has agreed to follow-up with our clinic in 2-3 weeks. She was informed of the importance of frequent follow-up visits to maximize her success with intensive lifestyle modifications for her multiple health conditions.   Objective:   Blood pressure 119/82, pulse 82, temperature 99 F (37.2 C), height '5\' 4"'$  (1.626 m), weight 183 lb (83 kg), SpO2 99 %. Body mass index is  31.41 kg/m.  General: Cooperative, alert, well developed, in no acute distress. HEENT: Conjunctivae and lids unremarkable. Cardiovascular: Regular rhythm.  Lungs: Normal work of breathing. Neurologic: No focal deficits.   Lab Results  Component Value Date   CREATININE 0.67 07/28/2022   BUN 5 (L) 07/28/2022   NA 142 07/28/2022   K 4.2 07/28/2022   CL 106 07/28/2022   CO2 22  07/28/2022   Lab Results  Component Value Date   ALT 10 07/28/2022   AST 15 07/28/2022   ALKPHOS 57 07/28/2022   BILITOT <0.2 07/28/2022   Lab Results  Component Value Date   HGBA1C 5.3 01/28/2022   HGBA1C 5.5 08/12/2021   HGBA1C 5.5 12/29/2017   Lab Results  Component Value Date   INSULIN 6.7 01/28/2022   INSULIN 10.1 08/12/2021   Lab Results  Component Value Date   TSH 4.320 07/28/2022   Lab Results  Component Value Date   CHOL 157 07/28/2022   HDL 39 (L) 07/28/2022   LDLCALC 105 (H) 07/28/2022   TRIG 68 07/28/2022   CHOLHDL 4.0 07/28/2022   Lab Results  Component Value Date   VD25OH 54.4 01/28/2022   VD25OH 36.0 08/12/2021   Lab Results  Component Value Date   WBC 10.0 05/20/2015   HGB 10.7 (L) 05/20/2015   HCT 32.0 (L) 05/20/2015   MCV 92.2 05/20/2015   PLT 179 05/20/2015   No results found for: "IRON", "TIBC", "FERRITIN"  Attestation Statements:   Reviewed by clinician on day of visit: allergies, medications, problem list, medical history, surgical history, family history, social history, and previous encounter notes.  I, Davy Pique, am acting as Location manager for Loyal Gambler, DO.  I have reviewed the above documentation for accuracy and completeness, and I agree with the above. Dell Ponto, DO

## 2022-09-23 NOTE — Progress Notes (Unsigned)
TeleHealth Visit:  This visit was completed with telemedicine (audio/video) technology. Stacey Singh has verbally consented to this TeleHealth visit. The patient is located at home, the provider is located at home. The participants in this visit include the listed provider and patient. The visit was conducted today via MyChart video.  OBESITY Stacey Singh is here to discuss her progress with her obesity treatment plan along with follow-up of her obesity related diagnoses.   Today's visit was # 22 Starting weight: 236 lbs Starting date: 08/12/2021 Weight at last in office visit: 183 lbs on 09/03/22 Total weight loss: 53 lbs at last in office visit on 09/03/22. Today's reported weight: 180 lbs   Nutrition Plan: the Category 2 plan.  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  Interim History:  She has done very good with weight loss. She is nervous about losing coverage for [facility] after April 1.  She would like to discuss options.  Pharmacotherapy: Stacey Singh is on Wegovy 1.7 SQ weekly Adverse side effects:  Feels "bad" 24-48 hours after injection.  Side effects have improved a great deal since for starting Wegovy. Hunger is well controlled.  Cravings are well controlled.  Assessment/Plan:  1. Other depression/emotional eating Stacey Singh has had issues with stress/emotional eating. Currently this is well controlled. Overall mood is stable. Medication(s): Bupropion SR 150 mg daily  Plan: Continue Bupropion SR 150 mg daily  2.  Dyspepsia Has occasional GERD when she eats too many carbs.  Has had GI upset from [facility] since she has started taking it.  On omeprazole 40 mg daily with good relief.  Plan: Refill omeprazole 40 mg daily.  3. Vitamin D Deficiency Vitamin D is at goal of 50.  Most recent vitamin D level was 54 on 01/28/2022.Marland Kitchen She is on  prescription ergocalciferol 50,000 IU weekly. Lab Results  Component Value Date    VD25OH 54.4 01/28/2022   VD25OH 36.0 08/12/2021    Plan: Refill prescription vitamin D 50,000 IU weekly.   Morbid Obesity: Current BMI 31 Pharmacotherapy Plan Continue and refill  Wegovy 1.7 SQ weekly  1.  Discussed other options for pharmacotherapy for weight loss: Paying for [employer] out-of-pocket (550 per refill) and taking every 2 weeks. Qsymia or phentermine/Lomaira Metformin  2.  Advised if GLP-1 is discontinued it will be more important than ever to be proactive with protein intake, physical activity, plan adherence.  Stacey Singh is currently in the action stage of change. As such, her goal is to continue with weight loss efforts.  She has agreed to the Category 2 plan.  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  Discussed adding in hand weights 3 times per week.  Behavioral modification strategies: increasing lean protein intake, decreasing simple carbohydrates , and planning for success.  Stacey Singh has agreed to follow-up with our clinic in 2 weeks.   No orders of the defined types were placed in this encounter.   Medications Discontinued During This Encounter  Medication Reason   Semaglutide-Weight Management (WEGOVY) 1.7 MG/0.75ML SOAJ Reorder   Vitamin D, Ergocalciferol, (DRISDOL) 1.25 MG (50000 UNIT) CAPS capsule Reorder   omeprazole (PRILOSEC) 40 MG capsule Reorder     Meds ordered this encounter  Medications   Semaglutide-Weight Management (WEGOVY) 1.7 MG/0.75ML SOAJ    Sig: Inject 1.7 mg into the skin once a week.    Dispense:  3 mL    Refill:  0    Order Specific Question:  Supervising Provider    Answer:   Dell Ponto [[id number]]   omeprazole (PRILOSEC) 40 MG capsule    Sig: Take 1 capsule (40 mg total) by mouth daily.    Dispense:  90 capsule    Refill:  0    Order Specific Question:   Supervising Provider    Answer:   Loyal Gambler E [[id number]]   Vitamin D, Ergocalciferol, (DRISDOL) 1.25 MG (50000 UNIT) CAPS capsule    Sig: Take 1 capsule  (50,000 Units total) by mouth every 7 (seven) days.    Dispense:  12 capsule    Refill:  0    Order Specific Question:   Supervising Provider    Answer:   Dell Ponto [[id number]]      Objective:   VITALS: Per patient if applicable, see vitals. GENERAL: Alert and in no acute distress. CARDIOPULMONARY: No increased WOB. Speaking in clear sentences.  PSYCH: Pleasant and cooperative. Speech normal rate and rhythm. Affect is appropriate. Insight and judgement are appropriate. Attention is focused, linear, and appropriate.  NEURO: Oriented as arrived to appointment on time with no prompting.   Attestation Statements:   Reviewed by clinician on day of visit: allergies, medications, problem list, medical history, surgical history, family history, social history, and previous encounter notes.   This was prepared with the assistance of Presenter, broadcasting.  Occasional wrong-word or sound-a-like substitutions may have occurred due to the inherent limitations of voice recognition software.

## 2022-09-24 ENCOUNTER — Telehealth (INDEPENDENT_AMBULATORY_CARE_PROVIDER_SITE_OTHER): Payer: BC Managed Care – PPO | Admitting: Family Medicine

## 2022-09-24 ENCOUNTER — Encounter (INDEPENDENT_AMBULATORY_CARE_PROVIDER_SITE_OTHER): Payer: Self-pay | Admitting: Family Medicine

## 2022-09-24 DIAGNOSIS — Z6831 Body mass index (BMI) 31.0-31.9, adult: Secondary | ICD-10-CM

## 2022-09-24 DIAGNOSIS — E559 Vitamin D deficiency, unspecified: Secondary | ICD-10-CM

## 2022-09-24 DIAGNOSIS — R1013 Epigastric pain: Secondary | ICD-10-CM

## 2022-09-24 DIAGNOSIS — F3289 Other specified depressive episodes: Secondary | ICD-10-CM

## 2022-09-24 MED ORDER — WEGOVY 1.7 MG/0.75ML ~~LOC~~ SOAJ: 1.7000 mg | SUBCUTANEOUS | 0 refills | Status: DC

## 2022-09-24 MED ORDER — OMEPRAZOLE 40 MG PO CPDR: 40.0000 mg | DELAYED_RELEASE_CAPSULE | Freq: Every day | ORAL | 0 refills | Status: DC

## 2022-09-24 MED ORDER — VITAMIN D (ERGOCALCIFEROL) 1.25 MG (50000 UNIT) PO CAPS: 50000.0000 [IU] | ORAL_CAPSULE | ORAL | 0 refills | Status: DC

## 2022-10-05 ENCOUNTER — Ambulatory Visit (INDEPENDENT_AMBULATORY_CARE_PROVIDER_SITE_OTHER): Payer: BC Managed Care – PPO | Admitting: Family Medicine

## 2022-10-05 ENCOUNTER — Encounter (INDEPENDENT_AMBULATORY_CARE_PROVIDER_SITE_OTHER): Payer: Self-pay | Admitting: Family Medicine

## 2022-10-05 VITALS — BP 110/80 | HR 76 | Temp 98.2°F | Ht 64.0 in | Wt 176.0 lb

## 2022-10-05 DIAGNOSIS — Z6830 Body mass index (BMI) 30.0-30.9, adult: Secondary | ICD-10-CM

## 2022-10-05 DIAGNOSIS — F3289 Other specified depressive episodes: Secondary | ICD-10-CM

## 2022-10-05 DIAGNOSIS — R632 Polyphagia: Secondary | ICD-10-CM

## 2022-10-05 DIAGNOSIS — E559 Vitamin D deficiency, unspecified: Secondary | ICD-10-CM | POA: Diagnosis not present

## 2022-10-05 DIAGNOSIS — R1013 Epigastric pain: Secondary | ICD-10-CM | POA: Diagnosis not present

## 2022-10-05 MED ORDER — BUPROPION HCL ER (SR) 150 MG PO TB12: 150.0000 mg | ORAL_TABLET | Freq: Every day | ORAL | 0 refills | Status: DC

## 2022-10-05 MED ORDER — WEGOVY 1.7 MG/0.75ML ~~LOC~~ SOAJ: 1.7000 mg | SUBCUTANEOUS | 0 refills | Status: DC

## 2022-10-05 MED ORDER — OMEPRAZOLE 40 MG PO CPDR: 40.0000 mg | DELAYED_RELEASE_CAPSULE | Freq: Every day | ORAL | 0 refills | Status: DC

## 2022-10-05 NOTE — Assessment & Plan Note (Signed)
Improving on omeprazole 40 mg once daily.  Dyspepsia has worsened on Wegovy.  She is working on forcing control at mealtime, healthy food choices and avoiding late night eating.

## 2022-10-05 NOTE — Assessment & Plan Note (Signed)
Last vitamin D Lab Results  Component Value Date   VD25OH 54.4 01/28/2022   Last vitamin D level from 07/28/2022 at 37.  She has been taking prescription vitamin D 50,000 IU once weekly.  Energy level is starting to improve.  Plan to recheck in May

## 2022-10-05 NOTE — Assessment & Plan Note (Signed)
Improving on Wegovy 1.7 mg once weekly injection.  Her insurance will be dropping coverage for [facility] and she has started to move it out to q. 14 days.  Denies any additional nausea.  She has a good amount of satiety from [facility] 1.7 mg but is able to consume adequate protein intake.  She is making healthy food choices and has seen 25% total body weight loss on Wegovy.

## 2022-10-05 NOTE — Progress Notes (Signed)
Office: [phone]  /  Fax: [phone]  WEIGHT SUMMARY AND BIOMETRICS  Vitals Temp: 98.2 F (36.8 C) BP: 110/80 Pulse Rate: 76 SpO2: 98 %   Anthropometric Measurements Height: '5\' 4"'$  (1.626 m) Weight: 176 lb (79.8 kg) BMI (Calculated): 30.2 Weight at Last Visit: 183lb Weight Lost Since Last Visit: 7lb Starting Weight: 236lb Total Weight Loss (lbs): 60 lb (27.2 kg)   Body Composition  Body Fat %: 39.7 % Fat Mass (lbs): 70.2 lbs Muscle Mass (lbs): 101 lbs Total Body Water (lbs): 72.8 lbs Visceral Fat Rating : 8   Other Clinical Data Fasting: no Labs: no Today's Visit #: 23     HPI  Chief Complaint: OBESITY  Stacey Singh is here to discuss her progress with her obesity treatment plan. She is on the the Category 2 Plan and states she is following her eating plan approximately 82 % of the time. She states she is exercising 10-15 minutes 3-4 times per week.   Interval History:  Since last office visit she down 7 pounds This gives her a net weight loss of 60 pounds and 14 months of medically supervised weight management Doing well on Wegovy 1.7 mg once weekly injection Getting in adequate protein with meals Denies additional hunger or cravings Has been more physically active at work  Lost 1.2 pounds of muscle mass and lost 5.8 pounds of body fat in the past 4 weeks Plans to start using free weights while at work   Pharmacotherapy: [id number]  PHYSICAL EXAM:  Blood pressure 110/80, pulse 76, temperature 98.2 F (36.8 C), height '5\' 4"'$  (1.626 m), weight 176 lb (79.8 kg), SpO2 98 %. Body mass index is 30.21 kg/m.  General: She is overweight, cooperative, alert, well developed, and in no acute distress. PSYCH: Has normal mood, affect and thought process.   Lungs: Normal breathing effort, no conversational dyspnea.  DIAGNOSTIC DATA REVIEWED:  BMET    Component Value Date/Time   NA 142 07/28/2022 0756   K 4.2 07/28/2022 0756   CL 106 07/28/2022 0756   CO2  22 07/28/2022 0756   GLUCOSE 93 07/28/2022 0756   GLUCOSE 94 06/13/2015 1543   BUN 5 (L) 07/28/2022 0756   CREATININE 0.67 07/28/2022 0756   CREATININE 0.56 06/13/2015 1543   CALCIUM 8.5 (L) 07/28/2022 0756   GFRNONAA 109 12/29/2017 0942   GFRNONAA >89 06/13/2015 1543   GFRAA 126 12/29/2017 0942   GFRAA >89 06/13/2015 1543   Lab Results  Component Value Date   HGBA1C 5.3 01/28/2022   HGBA1C 5.5 12/29/2017   Lab Results  Component Value Date   INSULIN 6.7 01/28/2022   INSULIN 10.1 08/12/2021   Lab Results  Component Value Date   TSH 4.320 07/28/2022   CBC    Component Value Date/Time   WBC 10.0 05/20/2015 0530   RBC 3.47 (L) 05/20/2015 0530   HGB 10.7 (L) 05/20/2015 0530   HCT 32.0 (L) 05/20/2015 0530   PLT 179 05/20/2015 0530   MCV 92.2 05/20/2015 0530   MCH 30.8 05/20/2015 0530   MCHC 33.4 05/20/2015 0530   RDW 13.9 05/20/2015 0530   Iron Studies No results found for: "IRON", "TIBC", "FERRITIN", "IRONPCTSAT" Lipid Panel     Component Value Date/Time   CHOL 157 07/28/2022 0756   TRIG 68 07/28/2022 0756   HDL 39 (L) 07/28/2022 0756   CHOLHDL 4.0 07/28/2022 0756   LDLCALC 105 (H) 07/28/2022 0756   Hepatic Function Panel     Component Value Date/Time  PROT 6.2 07/28/2022 0756   ALBUMIN 3.8 (L) 07/28/2022 0756   AST 15 07/28/2022 0756   ALT 10 07/28/2022 0756   ALKPHOS 57 07/28/2022 0756   BILITOT <0.2 07/28/2022 0756      Component Value Date/Time   TSH 4.320 07/28/2022 0756   TSH 2.190 08/12/2021 1001   Nutritional Lab Results  Component Value Date   VD25OH 54.4 01/28/2022   VD25OH 36.0 08/12/2021     ASSESSMENT AND PLAN  TREATMENT PLAN FOR OBESITY:  Recommended Dietary Goals  Stacey Singh is currently in the action stage of change. As such, her goal is to continue weight management plan. She has agreed to the Category 2 Plan.  Behavioral Intervention  We discussed the following Behavioral Modification Strategies today: increasing lean  protein intake, increasing vegetables, increasing water intake, work on meal planning and easy cooking plans, decreasing eating out, consumption of processed foods, and making healthy choices when eating convenient foods, and work on managing stress, creating time for self-care and relaxation measures.  Additional resources provided today: NA  Recommended Physical Activity Goals  Stacey Singh has been advised to work up to 150 minutes of moderate intensity aerobic activity a week and strengthening exercises 2-3 times per week for cardiovascular health, weight loss maintenance and preservation of muscle mass.   She has agreed to Will begin resistance exercise 15 minutes, 4-5 times per week. Chosen activity weight training.   Pharmacotherapy We discussed various medication options to help Stacey Singh with her weight loss efforts and we both agreed to [facility].  ASSOCIATED CONDITIONS ADDRESSED TODAY  Dyspepsia Assessment & Plan: Improving on omeprazole 40 mg once daily.  Dyspepsia has worsened on Wegovy.  She is working on forcing control at mealtime, healthy food choices and avoiding late night eating.  Orders: -     Omeprazole; Take 1 capsule (40 mg total) by mouth daily.  Dispense: 90 capsule; Refill: 0  Other depression with emotional eating -     buPROPion HCl ER (SR); Take 1 tablet (150 mg total) by mouth daily.  Dispense: 30 tablet; Refill: 0  Morbid obesity: Starting BMI 40.4 -     Wegovy; Inject 1.7 mg into the skin once a week.  Dispense: 3 mL; Refill: 0  BMI 30.0-30.9,adult  Polyphagia Assessment & Plan: Improving on Wegovy 1.7 mg once weekly injection.  Her insurance will be dropping coverage for [facility] and she has started to move it out to q. 14 days.  Denies any additional nausea.  She has a good amount of satiety from [facility] 1.7 mg but is able to consume adequate protein intake.  She is making healthy food choices and has seen 25% total body weight loss on Wegovy.  Orders: -      Wegovy; Inject 1.7 mg into the skin once a week.  Dispense: 3 mL; Refill: 0  Vitamin D deficiency Assessment & Plan: Last vitamin D Lab Results  Component Value Date   VD25OH 54.4 01/28/2022   Last vitamin D level from 07/28/2022 at 65.  She has been taking prescription vitamin D 50,000 IU once weekly.  Energy level is starting to improve.  Plan to recheck in May       No follow-ups on file.Marland Kitchen She was informed of the importance of frequent follow up visits to maximize her success with intensive lifestyle modifications for her multiple health conditions.   ATTESTASTION STATEMENTS:  Reviewed by clinician on day of visit: allergies, medications, problem list, medical history, surgical history, family history, social  history, and previous encounter notes pertinent to obesity diagnosis.   I have personally spent 30 minutes total time today in preparation, patient care, nutritional counseling and documentation for this visit, including the following: review of clinical lab tests; review of medical tests/procedures/services.      Dell Ponto, DO DABFM, DABOM [facility] [address] [place], [place] [address] [phone]

## 2022-10-22 NOTE — Progress Notes (Signed)
TeleHealth Visit:  This visit was completed with telemedicine (audio/video) technology. [name] has verbally consented to this TeleHealth visit. The patient is located at home, the provider is located at home. The participants in this visit include the listed provider and patient. The visit was conducted today via MyChart video.  OBESITY Stacey Singh is here to discuss her progress with her obesity treatment plan along with follow-up of her obesity related diagnoses.   Today's visit was # 24 Starting weight: 236 lbs Starting date: 08/12/21 Weight at last in office visit: 176 lbs on 10/05/22 Total weight loss: 60 lbs at last in office visit on 10/05/22. Today's reported weight (10/26/22):  176 lbs   Nutrition Plan: the Category 2 plan - 50 % adherence.  Current exercise: walking exercising 15-30 minutes 3-4 times per week.  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  Interim History:  She is down to 176 lb and would like to get into the 160s. She is taking Wegovy 1.7 mg every 14 days and tolerating it well other than intolerance to raw vegetables. Notes polyphagia  a few days before next injection is due. She does well with protein intake at dinner. Lunch is struggle at times.  She no longer has coverage for [facility] as of April 1.  However I will send 1 more refill to see what happens.  Recently bought some hand weights -has not begun to use them.  Eating all of the prescribed protein: no Skipping meals: No Drinking adequate water: Yes Drinking sugar sweetened beverages: No  Pharmacotherapy: Kebrina is on Wegovy 1.7 SQ weekly Adverse side effects:  raw veggie intolerance.  Hunger is well controlled.  Cravings are well controlled.  Assessment/Plan:  1. Polyphagia Appetite well-controlled with Wegovy 1.7 mg taken every 14 days.  Has raw vegetable intolerance.  She has 8 to 10 injections left. Lost coverage as of today-April 1.  Plan: Continue and refill Wegovy 1.7 SQ weekly Continue to  take [facility] every 14 days. She has enough left to last 16-20 weeks.  2. Hypertension Hypertension well controlled.  Does not check blood pressure at home.  Denies symptoms of hypotension. Medication(s): Olmesartan 40 mg daily  BP Readings from Last 3 Encounters:  10/05/22 110/80  09/03/22 119/82  07/28/22 116/81   Lab Results  Component Value Date   CREATININE 0.67 07/28/2022   CREATININE 0.66 01/28/2022   CREATININE 0.55 (L) 08/12/2021   No results found for: "GFR"  Plan: Refill olmesartan 40 mg daily. Advised her to watch for signs of hypotension. Continue good fluid intake.  3. Other depression/emotional eating Payzlee has had issues with stress/emotional eating. Currently this is well controlled. Overall mood is stable. Medication(s): Bupropion SR 150 mg daily in am  Plan: Continue and refill Bupropion SR 150 mg daily in am   4. Morbid Obesity: Current BMI 30 Pharmacotherapy Plan Continue and refill  Wegovy 1.7 SQ weekly Take every 14 days.  [name] is currently in the action stage of change. As such, her goal is to continue with weight loss efforts.  She has agreed to the Category 2 plan.  Exercise goals: Add strength training into regimen 2-3 times per week.  Continue walking.  Behavioral modification strategies: increasing lean protein intake and meal planning .  [name] has agreed to follow-up with our clinic in 4 weeks.   No orders of the defined types were placed in this encounter.   Medications Discontinued During This Encounter  Medication Reason   olmesartan (BENICAR) 40 MG tablet Reorder  Semaglutide-Weight Management (WEGOVY) 1.7 MG/0.75ML SOAJ Reorder   buPROPion (WELLBUTRIN SR) 150 MG 12 hr tablet Reorder     Meds ordered this encounter  Medications   Semaglutide-Weight Management (WEGOVY) 1.7 MG/0.75ML SOAJ    Sig: Inject 1.7 mg into the skin once a week.    Dispense:  3 mL    Refill:  0    Order Specific Question:   Supervising Provider     Answer:   Dell Ponto [[id number]]   olmesartan (BENICAR) 40 MG tablet    Sig: Take 1 tablet (40 mg total) by mouth daily.    Dispense:  90 tablet    Refill:  0    Order Specific Question:   Supervising Provider    Answer:   Dell Ponto [[id number]]   buPROPion (WELLBUTRIN SR) 150 MG 12 hr tablet    Sig: Take 1 tablet (150 mg total) by mouth daily.    Dispense:  30 tablet    Refill:  0    Order Specific Question:   Supervising Provider    Answer:   Dell Ponto [[id number]]      Objective:   VITALS: Per patient if applicable, see vitals. GENERAL: Alert and in no acute distress. CARDIOPULMONARY: No increased WOB. Speaking in clear sentences.  PSYCH: Pleasant and cooperative. Speech normal rate and rhythm. Affect is appropriate. Insight and judgement are appropriate. Attention is focused, linear, and appropriate.  NEURO: Oriented as arrived to appointment on time with no prompting.   Attestation Statements:   Reviewed by clinician on day of visit: allergies, medications, problem list, medical history, surgical history, family history, social history, and previous encounter notes.  This was prepared with the assistance of Presenter, broadcasting.  Occasional wrong-word or sound-a-like substitutions may have occurred due to the inherent limitations of voice recognition software.

## 2022-10-26 ENCOUNTER — Encounter (INDEPENDENT_AMBULATORY_CARE_PROVIDER_SITE_OTHER): Payer: Self-pay | Admitting: Family Medicine

## 2022-10-26 ENCOUNTER — Telehealth (INDEPENDENT_AMBULATORY_CARE_PROVIDER_SITE_OTHER): Payer: BC Managed Care – PPO | Admitting: Family Medicine

## 2022-10-26 DIAGNOSIS — F3289 Other specified depressive episodes: Secondary | ICD-10-CM

## 2022-10-26 DIAGNOSIS — R632 Polyphagia: Secondary | ICD-10-CM

## 2022-10-26 DIAGNOSIS — I10 Essential (primary) hypertension: Secondary | ICD-10-CM | POA: Diagnosis not present

## 2022-10-26 DIAGNOSIS — Z6830 Body mass index (BMI) 30.0-30.9, adult: Secondary | ICD-10-CM

## 2022-10-26 MED ORDER — WEGOVY 1.7 MG/0.75ML ~~LOC~~ SOAJ
1.7000 mg | SUBCUTANEOUS | 0 refills | Status: DC
Start: 2022-10-26 — End: 2022-11-03

## 2022-10-26 MED ORDER — BUPROPION HCL ER (SR) 150 MG PO TB12
150.0000 mg | ORAL_TABLET | Freq: Every day | ORAL | 0 refills | Status: DC
Start: 2022-10-26 — End: 2023-01-11

## 2022-10-26 MED ORDER — OLMESARTAN MEDOXOMIL 40 MG PO TABS
40.0000 mg | ORAL_TABLET | Freq: Every day | ORAL | 0 refills | Status: DC
Start: 2022-10-26 — End: 2022-11-23

## 2022-11-02 ENCOUNTER — Encounter (INDEPENDENT_AMBULATORY_CARE_PROVIDER_SITE_OTHER): Payer: Self-pay | Admitting: Family Medicine

## 2022-11-02 DIAGNOSIS — R632 Polyphagia: Secondary | ICD-10-CM

## 2022-11-03 ENCOUNTER — Ambulatory Visit: Payer: BC Managed Care – PPO | Admitting: Physical Therapy

## 2022-11-03 MED ORDER — WEGOVY 1.7 MG/0.75ML ~~LOC~~ SOAJ
1.7000 mg | SUBCUTANEOUS | 0 refills | Status: DC
Start: 2022-11-03 — End: 2023-06-28

## 2022-11-04 NOTE — Therapy (Signed)
OUTPATIENT PHYSICAL THERAPY UPPER EXTREMITY EVALUATION   Patient Name: Stacey Singh MRN: [id number] DOB:[birth year], 47 y.o., female Today's Date: 11/05/2022  END OF SESSION:  PT End of Session - 11/05/22 0846     Visit Number 1    Number of Visits 9    Date for PT Re-Evaluation 01/02/23    Authorization Type BCBS    PT Start Time [phone]    PT Stop Time 0930    PT Time Calculation (min) 44 min    Activity Tolerance Patient tolerated treatment well    Behavior During Therapy [facility] for tasks assessed/performed             Past Medical History:  Diagnosis Date   Allergy    Cancer    breast   Family history of breast cancer    Family history of leukemia    Female infertility    Heart murmur    in high school, resolved   History of COVID-19 08/05/2020   Hypertension    Past Surgical History:  Procedure Laterality Date   BREAST CYST EXCISION Bilateral 10/09/2020   Procedure: EXCISION OF EXCESS BREAST TISSUE;  Surgeon: Peggye Form, DO;  Location: [facility];  Service: Plastics;  Laterality: Bilateral;   BREAST RECONSTRUCTION WITH PLACEMENT OF TISSUE EXPANDER AND FLEX HD (ACELLULAR HYDRATED DERMIS) Bilateral 03/13/2020   Procedure: BILATERAL BREAST RECONSTRUCTION WITH PLACEMENT OF TISSUE EXPANDERS AND FLEX HD (ACELLULAR HYDRATED DERMIS);  Surgeon: Peggye Form, DO;  Location: [facility];  Service: Plastics;  Laterality: Bilateral;   CESAREAN SECTION  04/04/2012   Procedure: CESAREAN SECTION;  Surgeon: Freddrick March. Tenny Craw, MD;  Location: [facility];  Service: Obstetrics;  Laterality: N/A;   CESAREAN SECTION N/A 05/19/2015   Procedure: CESAREAN SECTION;  Surgeon: Philip Aspen, DO;  Location: [facility];  Service: Obstetrics;  Laterality: N/A;   CESAREAN SECTION     DILATION AND CURETTAGE OF UTERUS     LIPOSUCTION WITH LIPOFILLING Bilateral 10/09/2020   Procedure: LIPOSUCTION WITH LIPOFILLING;  Surgeon: Peggye Form, DO;  Location: [facility];  Service: Plastics;  Laterality: Bilateral;  3 hours total, please   MASTECTOMY W/ SENTINEL NODE BIOPSY Bilateral 03/13/2020   Procedure: BILATERAL MASTECTOMY WITH RIGHT SENTINEL LYMPH NODE BIOPSY;  Surgeon: Griselda Miner, MD;  Location: [facility];  Service: General;  Laterality: Bilateral;  PEC BLOCK   REMOVAL OF BILATERAL TISSUE EXPANDERS WITH PLACEMENT OF BILATERAL BREAST IMPLANTS Bilateral 06/13/2020   Procedure: REMOVAL OF BILATERAL TISSUE EXPANDERS WITH PLACEMENT OF BILATERAL BREAST IMPLANTS;  Surgeon: Peggye Form, DO;  Location:  [facility];  Service: Plastics;  Laterality: Bilateral;   WISDOM TOOTH EXTRACTION     Patient Active Problem List   Diagnosis Date Noted   Nausea 07/28/2022   Polyphagia 07/28/2022   Dyspepsia 06/09/2022   Other constipation 04/30/2022   Other hyperlipidemia 02/17/2022   Vitamin D deficiency 12/24/2021   Depression 12/24/2021   Insulin resistance 10/27/2021   Eating disorder 10/27/2021   S/P breast reconstruction, bilateral 07/17/2020   Acquired absence of breast 03/22/2020   Breast cancer 03/13/2020   Genetic testing 01/05/2020   History of ductal carcinoma in situ (DCIS) of breast 01/03/2020   Family history of breast cancer    Family history of leukemia    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   Morbid obesity 12/29/2017   Benign essential HTN 06/13/2015    PCP: Benny Lennert  L, PA-C  REFERRING PROVIDER: Griselda Miner, MD   REFERRING DIAG: D05.11 (ICD-10-CM) - Intraductal carcinoma in situ of right breast   THERAPY DIAG:  Stiffness of right shoulder, not elsewhere classified  Breast pain, right  Abnormal posture  Muscle weakness (generalized)  Rationale for Evaluation and Treatment: Rehabilitation  ONSET DATE: 2021   SUBJECTIVE:                                                                                                                                                                                       SUBJECTIVE STATEMENT: Patient reports her Rt chest feels better compared to last round of PT, but it still doesn't feel normal. She had her yearly check up with surgeon and was still complaining of tightness in the Rt breast. She feels the "tightness/discomfort" mostly along the superior aspect of the Rt breast. She reports there is still some numbness around the breast, but this has been present since her surgery. She reports the tightness can be aggravated with bending forward occasionally, but otherwise is unsure of what are aggravating factors.   Hand dominance: Right  PERTINENT HISTORY: Bilateral mastectomy with reconstruction 2021 for ductal carcinoma in situ Rt breast   PAIN:  Are you having pain? Yes: NPRS scale: 2 currently; 7 at worst/10 Pain location: Rt superior breast Pain description: discomfort Aggravating factors: bending Relieving factors: unknown  PRECAUTIONS: None  WEIGHT BEARING RESTRICTIONS: No  FALLS:  Has patient fallen in last 6 months? 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   PLOF: Independent  PATIENT GOALS: "less discomfort"    OBJECTIVE:   DIAGNOSTIC FINDINGS:  Bilateral breast MRI 2023   IMPRESSION: 1. Postsurgical changes related to bilateral mastectomies with implant reconstruction. No MRI evidence of malignancy or any other abnormalities in the reconstructed breasts.   2.  Cholelithiasis.  PATIENT SURVEYS :  FOTO 76% function to 73% predicted   COGNITION: Overall cognitive status: Within functional limits for tasks assessed     SENSATION: Not tested  POSTURE: Forward head/rounded shoulders   UPPER EXTREMITY ROM:   Active ROM Right eval Left eval  Shoulder flexion WNL "pull"   Shoulder extension     Shoulder abduction WNL "pull"   Shoulder adduction    Shoulder internal rotation 60   Shoulder external rotation 85   Elbow flexion    Elbow extension    Wrist flexion    Wrist extension    Wrist ulnar deviation    Wrist radial deviation  Wrist pronation    Wrist supination    (Blank rows = not tested)  UPPER EXTREMITY MMT:  MMT Right eval Left eval  Shoulder flexion 4+   Lat 4    Shoulder abduction 5   Shoulder horizontal adduction 4   Shoulder internal rotation 5   Shoulder external rotation 5   Middle trapezius 4 4  Lower trapezius 4- 4-  Elbow flexion    Elbow extension    Wrist flexion    Wrist extension    Wrist ulnar deviation    Wrist radial deviation    Wrist pronation    Wrist supination    Grip strength (lbs)    (Blank rows = not tested)  SHOULDER SPECIAL TESTS: Latissimus dorsi muscle length assessment: 135 degrees  Table to acromion distance: 11 cm Rt; 10 cm Lt   JOINT MOBILITY TESTING:  Hypomobility PAIVM thoracic spine   PALPATION:  Tautness and palpable tenderness Rt pectorals, latissimus dorsi   [facility] Adult PT Treatment:                                                DATE: 11/04/22 Therapeutic Exercise: Demonstrated and issue initial HEP.   Manual Therapy: STM/Trigger point release pectorals and latissimus dorsi Active release lat   Therapeutic Activity: Education on assessment findings that will be addressed throughout duration of POC.     PATIENT EDUCATION: Education details: see treatment  Person educated: Patient Education method: Explanation, Demonstration, Tactile cues, Verbal cues, and Handouts Education comprehension: verbalized understanding, returned demonstration, verbal cues required, tactile cues required, and needs further education  HOME EXERCISE PROGRAM: Access Code: [email or web address] URL: [email or web address] Date: 11/05/2022 Prepared by: Letitia LibraSamantha Mckinze Poirier  Exercises - Doorway Pec Stretch at 90 Degrees  Abduction  - 1 x daily - 7 x weekly - 3 sets - 30 sec  hold - Standing Bent Over Shoulder Row  - 1 x daily - 7 x weekly - 2 sets - 10 reps - Supine Scapular Protraction in Flexion with Dumbbells  - 1 x daily - 7 x weekly - 2 sets - 10 reps - Sidelying Thoracic Rotation with Open Book  - 1 x daily - 7 x weekly - 1 sets - 10 reps  ASSESSMENT:  CLINICAL IMPRESSION: Patient is a 47 y.o. female who was seen today for physical therapy evaluation and treatment for chronic Rt breast "tightness/discomfort" s/p mastectomy and reconstruction in 2021. She has completed PT before for this same complaint with fairly good improvement in her symptoms with various manual therapy techniques, trigger point dry needling, and postural strengthening. Upon assessment she is noted to have decreased pectoral and latissimus dorsi length,tautness and palpable tenderness about the Rt breast and surrounding musculature,  periscapular weakness, thoracic spine hypomobility ,and postural abnormalities. She will benefit from skilled PT to address the above stated deficits in order to optimize her function.    OBJECTIVE IMPAIRMENTS: decreased activity tolerance, decreased mobility, decreased ROM, decreased strength, hypomobility, increased fascial restrictions, impaired flexibility, postural dysfunction, and pain.   ACTIVITY LIMITATIONS: bending and reach over head  PARTICIPATION LIMITATIONS: community activity and occupation  PERSONAL FACTORS: Age, Fitness, Time since onset of injury/illness/exacerbation, and 1 comorbidity: see PMH above  are also affecting patient's functional outcome.   REHAB POTENTIAL: Good  CLINICAL DECISION MAKING: Stable/uncomplicated  EVALUATION COMPLEXITY: Low  GOALS: Goals reviewed with patient?  Yes  SHORT TERM GOALS: Target date: 12/03/2022    Patient will be independent and compliant with initial HEP.   Baseline: issued at eval  Goal status: INITIAL  2.  Patient will demonstrate at  least 70 degrees of Rt shoulder IR AROM to improve ability to complete self-care activities.  Baseline: see above  Goal status: INITIAL  3.  Patient will demonstrate </= 10 cm with table to acromion test on the Rt to signify a reduction in tightness in chest musculature.  Baseline: see above  Goal status: INITIAL   LONG TERM GOALS: Target date: 01/02/2023    Patient will report tightness/discomfort at worst as </=4/10 to reduce her current functional limitations.  Baseline: see above  Goal status: INITIAL  2.  Patient will demonstrate 5/5 middle trap strength to improve postural stability.  Baseline: see above  Goal status: INITIAL  3.  Patient will improve lat length by at least 10 degrees indicative of improved muscle extensibility and reduction in tightness.  Baseline: see above  Goal status: INITIAL  4.  Patient will be independent with advanced home program to assist in management of her chronic condition.  Baseline: initial HEP issued  Goal status: INITIAL   PLAN: PT FREQUENCY: 1x/week  PT DURATION: 8 weeks  PLANNED INTERVENTIONS: Therapeutic exercises, Therapeutic activity, Neuromuscular re-education, Patient/Family education, Self Care, Joint mobilization, Dry Needling, Spinal manipulation, Spinal mobilization, Cryotherapy, Moist heat, Manual therapy, and Re-evaluation  PLAN FOR NEXT SESSION: review and progress HEP prn; TPDN to pecs, lats; manual to chest musculature, postural strengthening   Letitia Libra, PT, DPT, ATC 11/05/22 11:59 AM

## 2022-11-05 ENCOUNTER — Other Ambulatory Visit: Payer: Self-pay

## 2022-11-05 ENCOUNTER — Ambulatory Visit: Payer: BC Managed Care – PPO | Attending: Physician Assistant

## 2022-11-05 DIAGNOSIS — M6289 Other specified disorders of muscle: Secondary | ICD-10-CM | POA: Insufficient documentation

## 2022-11-05 DIAGNOSIS — R293 Abnormal posture: Secondary | ICD-10-CM | POA: Insufficient documentation

## 2022-11-05 DIAGNOSIS — N644 Mastodynia: Secondary | ICD-10-CM | POA: Diagnosis present

## 2022-11-05 DIAGNOSIS — M25611 Stiffness of right shoulder, not elsewhere classified: Secondary | ICD-10-CM | POA: Insufficient documentation

## 2022-11-05 DIAGNOSIS — M6281 Muscle weakness (generalized): Secondary | ICD-10-CM | POA: Diagnosis present

## 2022-11-11 NOTE — Therapy (Signed)
OUTPATIENT PHYSICAL THERAPY UPPER EXTREMITY EVALUATION   Patient Name: Stacey Singh MRN: [id number] DOB:04/10/1976, 47 y.o., female Today's Date: 11/12/2022  END OF SESSION:  PT End of Session - 11/12/22 0759     Visit Number 2    Number of Visits 9    Date for PT Re-Evaluation 01/02/23    Authorization Type [employer]    PT Start Time 0800    PT Stop Time 0840    PT Time Calculation (min) 40 min    Activity Tolerance Patient tolerated treatment well    Behavior During Therapy WFL for tasks assessed/performed              Past Medical History:  Diagnosis Date   Allergy    Cancer    breast   Family history of breast cancer    Family history of leukemia    Female infertility    Heart murmur    in high school, resolved   History of COVID-19 08/05/2020   Hypertension    Past Surgical History:  Procedure Laterality Date   BREAST CYST EXCISION Bilateral 10/09/2020   Procedure: EXCISION OF EXCESS BREAST TISSUE;  Surgeon: Peggye Form, DO;  Location: [facility];  Service: Plastics;  Laterality: Bilateral;   BREAST RECONSTRUCTION WITH PLACEMENT OF TISSUE EXPANDER AND FLEX HD (ACELLULAR HYDRATED DERMIS) Bilateral 03/13/2020   Procedure: BILATERAL BREAST RECONSTRUCTION WITH PLACEMENT OF TISSUE EXPANDERS AND FLEX HD (ACELLULAR HYDRATED DERMIS);  Surgeon: Peggye Form, DO;  Location: [facility];  Service: Plastics;  Laterality: Bilateral;   CESAREAN SECTION  04/04/2012   Procedure: CESAREAN SECTION;  Surgeon: Freddrick March. Tenny Craw, MD;  Location: [facility];  Service: Obstetrics;  Laterality: N/A;   CESAREAN SECTION N/A 05/19/2015   Procedure: CESAREAN SECTION;  Surgeon: Philip Aspen, DO;  Location: [facility];  Service: Obstetrics;  Laterality: N/A;   CESAREAN SECTION     DILATION AND CURETTAGE OF UTERUS     LIPOSUCTION WITH LIPOFILLING Bilateral 10/09/2020   Procedure: LIPOSUCTION WITH LIPOFILLING;  Surgeon: Peggye Form, DO;  Location:  [facility];  Service: Plastics;  Laterality: Bilateral;  3 hours total, please   MASTECTOMY W/ SENTINEL NODE BIOPSY Bilateral 03/13/2020   Procedure: BILATERAL MASTECTOMY WITH RIGHT SENTINEL LYMPH NODE BIOPSY;  Surgeon: Griselda Miner, MD;  Location: [facility];  Service: General;  Laterality: Bilateral;  PEC BLOCK   REMOVAL OF BILATERAL TISSUE EXPANDERS WITH PLACEMENT OF BILATERAL BREAST IMPLANTS Bilateral 06/13/2020   Procedure: REMOVAL OF BILATERAL TISSUE EXPANDERS WITH PLACEMENT OF BILATERAL BREAST IMPLANTS;  Surgeon: Peggye Form, DO;  Location: [facility];  Service: Plastics;  Laterality: Bilateral;   WISDOM TOOTH EXTRACTION     Patient Active Problem List   Diagnosis Date Noted   Nausea 07/28/2022   Polyphagia 07/28/2022   Dyspepsia 06/09/2022   Other constipation 04/30/2022   Other hyperlipidemia 02/17/2022   Vitamin D deficiency 12/24/2021   Depression 12/24/2021   Insulin resistance 10/27/2021   Eating disorder 10/27/2021   S/P breast reconstruction, bilateral 07/17/2020   Acquired absence of breast 03/22/2020   Breast cancer 03/13/2020   Genetic testing 01/05/2020   History of ductal carcinoma in situ (DCIS) of breast 01/03/2020   Family history of breast cancer    Family history of leukemia    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   Morbid obesity 12/29/2017   Benign essential HTN 06/13/2015    PCP: Valarie Cones,  Sharene Skeans  REFERRING PROVIDER: Griselda Miner, MD   REFERRING DIAG: [phone] (ICD-10-CM) - Intraductal carcinoma in situ of right breast   THERAPY DIAG:  Stiffness of right shoulder, not elsewhere classified  Breast pain, right  Abnormal posture  Muscle weakness (generalized)  Abnormal increased muscle tightness  Rationale for Evaluation and Treatment: Rehabilitation  ONSET DATE: 2021   SUBJECTIVE:                                                                                                                                                                                       SUBJECTIVE STATEMENT: I was not able to do the exercises  I only did the rows and  door way stretch. I am a 3/10 today except when you pull on it.  EVAL-Patient reports her Rt chest feels better compared to last round of PT, but it still doesn't feel normal. She had her yearly check up with surgeon and was still complaining of tightness in the Rt breast. She feels the "tightness/discomfort" mostly along the superior aspect of the Rt breast. She reports there is still some numbness around the breast, but this has been present since her surgery. She reports the tightness can be aggravated with bending forward occasionally, but otherwise is unsure of what are aggravating factors.   Hand dominance: Right  PERTINENT HISTORY: Bilateral mastectomy with reconstruction 2021 for ductal carcinoma in situ Rt breast   PAIN:  Are you having pain? Yes: NPRS scale: 2 currently; 7 at worst/10 Pain location: Rt superior breast Pain description: discomfort Aggravating factors: bending Relieving factors: unknown  PRECAUTIONS: None  WEIGHT BEARING RESTRICTIONS: No  FALLS:  Has patient fallen in last 6 months? 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   PLOF: Independent  PATIENT GOALS: "less discomfort"    OBJECTIVE:   DIAGNOSTIC FINDINGS:  Bilateral breast MRI 2023   IMPRESSION: 1. Postsurgical changes related to bilateral mastectomies with implant reconstruction. No MRI evidence of malignancy or any other abnormalities in the reconstructed breasts.   2.  Cholelithiasis.  PATIENT SURVEYS :  FOTO 76% function to 73% predicted   COGNITION: Overall cognitive status: Within functional limits for tasks  assessed     SENSATION: Not tested  POSTURE: Forward head/rounded shoulders   UPPER EXTREMITY ROM:   Active ROM Right eval Left eval  Shoulder flexion WNL "pull"   Shoulder extension    Shoulder abduction WNL "pull"   Shoulder adduction    Shoulder internal rotation 60  Shoulder external rotation 85   Elbow flexion    Elbow extension    Wrist flexion    Wrist extension    Wrist ulnar deviation    Wrist radial deviation    Wrist pronation    Wrist supination    (Blank rows = not tested)  UPPER EXTREMITY MMT:  MMT Right eval Left eval  Shoulder flexion 4+   Lat 4    Shoulder abduction 5   Shoulder horizontal adduction 4   Shoulder internal rotation 5   Shoulder external rotation 5   Middle trapezius 4 4  Lower trapezius 4- 4-  Elbow flexion    Elbow extension    Wrist flexion    Wrist extension    Wrist ulnar deviation    Wrist radial deviation    Wrist pronation    Wrist supination    Grip strength (lbs)    (Blank rows = not tested)  SHOULDER SPECIAL TESTS: Latissimus dorsi muscle length assessment: 135 degrees  Table to acromion distance: 11 cm Rt; 10 cm Lt   JOINT MOBILITY TESTING:  Hypomobility PAIVM thoracic spine   PALPATION:  Tautness and palpable tenderness Rt pectorals, latissimus dorsi   [facility] Adult PT Treatment:                                                DATE: 11-12-22 Therapeutic Exercise: Doorway Pec Stretch at 90 Degrees Abduction  30 sec  hold Standing Bent Over Shoulder Row  2 sets - 10 reps 8 # Supine Scapular Protraction in Flexion with Dumbbells  8 # 2 sets - 10 reps Side lying Thoracic Rotation with Open Book  - 1 x daily  Manual Therapy: STW  myofasical release of R minor and major pec, R subclavius  Long arm distractio of R UE  STW over R minor and major pec, R subclavius Trigger Point Dry-Needling performed     by Garen Lah Treatment instructions: Expect mild to moderate muscle soreness. S/S of pneumothorax if  dry needled over a lung field, and to seek immediate medical attention should they occur. Patient verbalized understanding of these instructions and education.  Patient Consent Given: Yes Education handout provided: Previously provided Muscles treated: R pec minor and major , R subclavius Electrical stimulation performed: No Parameters: N/A Treatment response/outcome: twitch response noted, pt noted relief Modality moist heat pack to R shld  [facility] Adult PT Treatment:                                                DATE: 11/04/22 Therapeutic Exercise: Demonstrated and issue initial HEP.   Manual Therapy: STM/Trigger point release pectorals and latissimus dorsi Active release lat   Therapeutic Activity: Education on assessment findings that will be addressed throughout duration of POC.     PATIENT EDUCATION: Education details: see treatment  Person educated: Patient Education method: Explanation, Demonstration, Tactile cues, Verbal cues, and Handouts Education comprehension: verbalized understanding, returned demonstration, verbal cues required, tactile cues required, and needs further education  HOME EXERCISE PROGRAM: Access Code: [email or web address] URL: [email or web address] Date: 11/05/2022 Prepared by: Letitia Libra  Exercises - Doorway Pec Stretch at 90 Degrees Abduction  - 1 x daily - 7 x weekly - 3  sets - 30 sec  hold - Standing Bent Over Shoulder Row  - 1 x daily - 7 x weekly - 2 sets - 10 reps - Supine Scapular Protraction in Flexion with Dumbbells  - 1 x daily - 7 x weekly - 2 sets - 10 reps - Sidelying Thoracic Rotation with Open Book  - 1 x daily - 7 x weekly - 1 sets - 10 reps  ASSESSMENT:  CLINICAL IMPRESSION:  Ms Henton present to clinic with 3/10 pain and increased with palpation of R pec minor/major.  Pt with 5/10 pain with palpation of R subclavius. Pt consents and is closely monitored throughout session.  Pt with initial tingling with intially needling  but then resolved with manual.  Pt noted relief after TPDN and was able to tolerate deep palpation with greater ease.  Pt ended session with exercise and motion with concurrent moist heat pack . Will continue to progress as pt is able  EVAL-Patient is a 47 y.o. female who was seen today for physical therapy evaluation and treatment for chronic Rt breast "tightness/discomfort" s/p mastectomy and reconstruction in 2021. She has completed PT before for this same complaint with fairly good improvement in her symptoms with various manual therapy techniques, trigger point dry needling, and postural strengthening. Upon assessment she is noted to have decreased pectoral and latissimus dorsi length,tautness and palpable tenderness about the Rt breast and surrounding musculature,  periscapular weakness, thoracic spine hypomobility ,and postural abnormalities. She will benefit from skilled PT to address the above stated deficits in order to optimize her function.    OBJECTIVE IMPAIRMENTS: decreased activity tolerance, decreased mobility, decreased ROM, decreased strength, hypomobility, increased fascial restrictions, impaired flexibility, postural dysfunction, and pain.   ACTIVITY LIMITATIONS: bending and reach over head  PARTICIPATION LIMITATIONS: community activity and occupation  PERSONAL FACTORS: Age, Fitness, Time since onset of injury/illness/exacerbation, and 1 comorbidity: see PMH above  are also affecting patient's functional outcome.   REHAB POTENTIAL: Good  CLINICAL DECISION MAKING: Stable/uncomplicated  EVALUATION COMPLEXITY: Low  GOALS: Goals reviewed with patient? Yes  SHORT TERM GOALS: Target date: 12/03/2022    Patient will be independent and compliant with initial HEP.   Baseline: issued at eval  Goal status: INITIAL  2.  Patient will demonstrate at least 70 degrees of Rt shoulder IR AROM to improve ability to complete self-care activities.  Baseline: see above  Goal status:  INITIAL  3.  Patient will demonstrate </= 10 cm with table to acromion test on the Rt to signify a reduction in tightness in chest musculature.  Baseline: see above  Goal status: INITIAL   LONG TERM GOALS: Target date: 01/02/2023    Patient will report tightness/discomfort at worst as </=4/10 to reduce her current functional limitations.  Baseline: see above  Goal status: INITIAL  2.  Patient will demonstrate 5/5 middle trap strength to improve postural stability.  Baseline: see above  Goal status: INITIAL  3.  Patient will improve lat length by at least 10 degrees indicative of improved muscle extensibility and reduction in tightness.  Baseline: see above  Goal status: INITIAL  4.  Patient will be independent with advanced home program to assist in management of her chronic condition.  Baseline: initial HEP issued  Goal status: INITIAL   PLAN: PT FREQUENCY: 1x/week  PT DURATION: 8 weeks  PLANNED INTERVENTIONS: Therapeutic exercises, Therapeutic activity, Neuromuscular re-education, Patient/Family education, Self Care, Joint mobilization, Dry Needling, Spinal manipulation, Spinal mobilization, Cryotherapy, Moist heat, Manual therapy, and Re-evaluation  PLAN FOR NEXT SESSION: review and progress HEP prn; TPDN to pecs, lats; manual to chest musculature, postural strengthening   Garen Lah, PT, [facility] Certified Exercise Expert for the Aging Adult  11/12/22 10:53 AM Phone: [phone] Fax: [phone]

## 2022-11-12 ENCOUNTER — Ambulatory Visit: Payer: BC Managed Care – PPO | Admitting: Physical Therapy

## 2022-11-12 ENCOUNTER — Other Ambulatory Visit: Payer: Self-pay

## 2022-11-12 ENCOUNTER — Encounter: Payer: Self-pay | Admitting: Physical Therapy

## 2022-11-12 DIAGNOSIS — M6289 Other specified disorders of muscle: Secondary | ICD-10-CM

## 2022-11-12 DIAGNOSIS — R293 Abnormal posture: Secondary | ICD-10-CM

## 2022-11-12 DIAGNOSIS — M25611 Stiffness of right shoulder, not elsewhere classified: Secondary | ICD-10-CM | POA: Diagnosis not present

## 2022-11-12 DIAGNOSIS — N644 Mastodynia: Secondary | ICD-10-CM

## 2022-11-12 DIAGNOSIS — M6281 Muscle weakness (generalized): Secondary | ICD-10-CM

## 2022-11-19 NOTE — Therapy (Signed)
OUTPATIENT PHYSICAL THERAPY UPPER EXTREMITY EVALUATION   Patient Name: Stacey Singh MRN: [id number] DOB:1976/07/25, 46 y.o., female Today's Date: 11/20/2022  END OF SESSION:  PT End of Session - 11/20/22 0717     Visit Number 3    Number of Visits 9    Date for PT Re-Evaluation 01/02/23    Authorization Type BCBS    PT Start Time [phone]    PT Stop Time 0800    PT Time Calculation (min) 44 min    Activity Tolerance Patient tolerated treatment well    Behavior During Therapy [facility] for tasks assessed/performed               Past Medical History:  Diagnosis Date   Allergy    Cancer (HCC)    breast   Family history of breast cancer    Family history of leukemia    Female infertility    Heart murmur    in high school, resolved   History of COVID-19 08/05/2020   Hypertension    Past Surgical History:  Procedure Laterality Date   BREAST CYST EXCISION Bilateral 10/09/2020   Procedure: EXCISION OF EXCESS BREAST TISSUE;  Surgeon: Peggye Form, DO;  Location: [facility];  Service: Plastics;  Laterality: Bilateral;   BREAST RECONSTRUCTION WITH PLACEMENT OF TISSUE EXPANDER AND FLEX HD (ACELLULAR HYDRATED DERMIS) Bilateral 03/13/2020   Procedure: BILATERAL BREAST RECONSTRUCTION WITH PLACEMENT OF TISSUE EXPANDERS AND FLEX HD (ACELLULAR HYDRATED DERMIS);  Surgeon: Peggye Form, DO;  Location: [facility];  Service: Plastics;  Laterality: Bilateral;   CESAREAN SECTION  04/04/2012   Procedure: CESAREAN SECTION;  Surgeon: Freddrick March. Tenny Craw, MD;  Location: [facility];  Service: Obstetrics;  Laterality: N/A;   CESAREAN SECTION N/A 05/19/2015   Procedure: CESAREAN SECTION;  Surgeon: Philip Aspen, DO;  Location: [facility];  Service: Obstetrics;  Laterality: N/A;   CESAREAN SECTION     DILATION AND CURETTAGE OF UTERUS     LIPOSUCTION WITH LIPOFILLING Bilateral 10/09/2020   Procedure: LIPOSUCTION WITH LIPOFILLING;  Surgeon: Peggye Form, DO;   Location: [facility];  Service: Plastics;  Laterality: Bilateral;  3 hours total, please   MASTECTOMY W/ SENTINEL NODE BIOPSY Bilateral 03/13/2020   Procedure: BILATERAL MASTECTOMY WITH RIGHT SENTINEL LYMPH NODE BIOPSY;  Surgeon: Griselda Miner, MD;  Location: [facility];  Service: General;  Laterality: Bilateral;  PEC BLOCK   REMOVAL OF BILATERAL TISSUE EXPANDERS WITH PLACEMENT OF BILATERAL BREAST IMPLANTS Bilateral 06/13/2020   Procedure: REMOVAL OF BILATERAL TISSUE EXPANDERS WITH PLACEMENT OF BILATERAL BREAST IMPLANTS;  Surgeon: Peggye Form, DO;  Location: [facility];  Service: Plastics;  Laterality: Bilateral;   WISDOM TOOTH EXTRACTION     Patient Active Problem List   Diagnosis Date Noted   Nausea 07/28/2022   Polyphagia 07/28/2022   Dyspepsia 06/09/2022   Other constipation 04/30/2022   Other hyperlipidemia 02/17/2022   Vitamin D deficiency 12/24/2021   Depression 12/24/2021   Insulin resistance 10/27/2021   Eating disorder 10/27/2021   S/P breast reconstruction, bilateral 07/17/2020   Acquired absence of breast 03/22/2020   Breast cancer (HCC) 03/13/2020   Genetic testing 01/05/2020   History of ductal carcinoma in situ (DCIS) of breast 01/03/2020   Family history of breast cancer    Family history of leukemia    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   Morbid obesity (HCC) 12/29/2017   Benign essential HTN 06/13/2015  PCP: Larkin Ina  REFERRING PROVIDER: Griselda Miner, MD   REFERRING DIAG: [phone] (ICD-10-CM) - Intraductal carcinoma in situ of right breast   THERAPY DIAG:  Stiffness of right shoulder, not elsewhere classified  Breast pain, right  Abnormal posture  Muscle weakness (generalized)  Rationale for Evaluation and Treatment: Rehabilitation  ONSET DATE: 2021   SUBJECTIVE:                                                                                                                                                                                       SUBJECTIVE STATEMENT: Patient reports the needling was helpful last time. Now she is noticing more tightness lower down on the chest.   Hand dominance: Right  PERTINENT HISTORY: Bilateral mastectomy with reconstruction 2021 for ductal carcinoma in situ Rt breast   PAIN:  Are you having pain? None currently Yes: NPRS scale: 7 at worst/10 Pain location: Rt superior breast Pain description: discomfort Aggravating factors: bending Relieving factors: unknown  PRECAUTIONS: None  WEIGHT BEARING RESTRICTIONS: No  FALLS:  Has patient fallen in last 6 months? 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   PLOF: Independent  PATIENT GOALS: "less discomfort"    OBJECTIVE:   DIAGNOSTIC FINDINGS:  Bilateral breast MRI 2023   IMPRESSION: 1. Postsurgical changes related to bilateral mastectomies with implant reconstruction. No MRI evidence of malignancy or any other abnormalities in the reconstructed breasts.   2.  Cholelithiasis.  PATIENT SURVEYS :  FOTO 76% function to 73% predicted   COGNITION: Overall cognitive status: Within functional limits for tasks assessed     SENSATION: Not tested  POSTURE: Forward head/rounded shoulders   UPPER EXTREMITY ROM:   Active ROM Right eval Left eval  Shoulder flexion WNL "pull"   Shoulder extension    Shoulder abduction WNL "pull"   Shoulder adduction    Shoulder internal rotation 60   Shoulder external rotation 85   Elbow flexion    Elbow extension    Wrist flexion    Wrist extension    Wrist ulnar deviation    Wrist radial deviation    Wrist pronation    Wrist supination    (Blank rows = not tested)  UPPER EXTREMITY MMT:  MMT Right eval Left eval  Shoulder  flexion 4+   Lat 4    Shoulder abduction 5   Shoulder horizontal adduction 4   Shoulder internal rotation 5   Shoulder external rotation 5   Middle trapezius 4 4  Lower trapezius 4- 4-  Elbow flexion    Elbow extension    Wrist flexion    Wrist extension    Wrist ulnar deviation    Wrist radial deviation    Wrist pronation    Wrist supination    Grip strength (lbs)    (Blank rows = not tested)  SHOULDER SPECIAL TESTS: Latissimus dorsi muscle length assessment: 135 degrees  Table to acromion distance: 11 cm Rt; 10 cm Lt   11/20/22: lat length: 145 degrees   JOINT MOBILITY TESTING:  Hypomobility PAIVM thoracic spine   PALPATION:  Tautness and palpable tenderness Rt pectorals, latissimus dorsi  [facility] Adult PT Treatment:                                                DATE: 11/20/22 Therapeutic Exercise: Pec stretch on roller x 2 minutes Childs pose x 1 minute  HEP review Manual Therapy: IASTM to pectorals DTM/trigger point release pectorals, lat, subscapularis Passive Rt shoulder ROM Passive lat and pec stetching  Trigger Point Dry Needling Treatment: Pre-treatment instruction: Patient instructed on dry needling rationale, procedures, and possible side effects including pain during treatment (achy,cramping feeling), bruising, drop of blood, lightheadedness, nausea, sweating. Patient Consent Given: Yes Education handout provided: Previously provided Muscles treated: Rt pec major/minor   Needle size and number: .30x68mm x 1 and .25x1mm x 1 Electrical stimulation performed: No Parameters: N/A Treatment response/outcome: Twitch response elicited and Palpable decrease in muscle tension Post-treatment instructions: Patient instructed to expect possible mild to moderate muscle soreness later today and/or tomorrow. Patient instructed in methods to reduce muscle soreness and to continue prescribed HEP. If patient was dry needled over the lung field, patient was instructed on signs  and symptoms of pneumothorax and, however unlikely, to see immediate medical attention should they occur. Patient was also educated on signs and symptoms of infection and to seek medical attention should they occur. Patient verbalized understanding of these instructions and education.  Self Care: Posture shirt and bras that could assist in improving postural awareness  [facility] Adult PT Treatment:                                                DATE: 11-12-22 Therapeutic Exercise: Doorway Pec Stretch at 90 Degrees Abduction  30 sec  hold Standing Bent Over Shoulder Row  2 sets - 10 reps 8 # Supine Scapular Protraction in Flexion with Dumbbells  8 # 2 sets - 10 reps Side lying Thoracic Rotation with Open Book  - 1 x daily  Manual Therapy: STW  myofasical release of R minor and major pec, R subclavius  Long arm distractio of R UE  STW over R minor and major pec, R subclavius Trigger Point Dry-Needling performed     by Garen Lah Treatment instructions: Expect mild to moderate muscle soreness. S/S of pneumothorax if dry needled over a lung field, and to seek immediate medical attention should they occur. Patient verbalized understanding of these instructions and education.  Patient Consent Given: Yes Education handout provided: Previously provided Muscles treated: R pec minor and major , R subclavius Electrical stimulation performed: No Parameters: N/A Treatment response/outcome: twitch response noted, pt noted relief Modality moist heat pack to  R shld  [facility] Adult PT Treatment:                                                DATE: 11/04/22 Therapeutic Exercise: Demonstrated and issue initial HEP.   Manual Therapy: STM/Trigger point release pectorals and latissimus dorsi Active release lat   Therapeutic Activity: Education on assessment findings that will be addressed throughout duration of POC.     PATIENT EDUCATION: Education details: see treatment  Person educated:  Patient Education method: Explanation Education comprehension: verbalized understanding  HOME EXERCISE PROGRAM: Access Code: [email or web address] URL: [email or web address] Date: 11/05/2022 Prepared by: Letitia Libra  Exercises - Doorway Pec Stretch at 90 Degrees Abduction  - 1 x daily - 7 x weekly - 3 sets - 30 sec  hold - Standing Bent Over Shoulder Row  - 1 x daily - 7 x weekly - 2 sets - 10 reps - Supine Scapular Protraction in Flexion with Dumbbells  - 1 x daily - 7 x weekly - 2 sets - 10 reps - Sidelying Thoracic Rotation with Open Book  - 1 x daily - 7 x weekly - 1 sets - 10 reps  ASSESSMENT:  CLINICAL IMPRESSION: Heavy emphasis on manual therapy techniques to assist in reducing musculature tautness about Rt chest. TPDN was performed to pectorals with excellent twitch response elicited and patient reporting soreness, post intervention as expected. She reported improvement in her lower chest tightness following manual therapy. Discussed potential benefit of a posture shirt/bra to assist in improving her postural awareness. Her lat length has improved compared to initial evaluation.   OBJECTIVE IMPAIRMENTS: decreased activity tolerance, decreased mobility, decreased ROM, decreased strength, hypomobility, increased fascial restrictions, impaired flexibility, postural dysfunction, and pain.   ACTIVITY LIMITATIONS: bending and reach over head  PARTICIPATION LIMITATIONS: community activity and occupation  PERSONAL FACTORS: Age, Fitness, Time since onset of injury/illness/exacerbation, and 1 comorbidity: see PMH above  are also affecting patient's functional outcome.   REHAB POTENTIAL: Good  CLINICAL DECISION MAKING: Stable/uncomplicated  EVALUATION COMPLEXITY: Low  GOALS: Goals reviewed with patient? Yes  SHORT TERM GOALS: Target date: 12/03/2022    Patient will be independent and compliant with initial HEP.   Baseline: issued at eval  Goal status: INITIAL  2.  Patient  will demonstrate at least 70 degrees of Rt shoulder IR AROM to improve ability to complete self-care activities.  Baseline: see above  Goal status: INITIAL  3.  Patient will demonstrate </= 10 cm with table to acromion test on the Rt to signify a reduction in tightness in chest musculature.  Baseline: see above  Goal status: INITIAL   LONG TERM GOALS: Target date: 01/02/2023    Patient will report tightness/discomfort at worst as </=4/10 to reduce her current functional limitations.  Baseline: see above  Goal status: INITIAL  2.  Patient will demonstrate 5/5 middle trap strength to improve postural stability.  Baseline: see above  Goal status: INITIAL  3.  Patient will improve lat length by at least 10 degrees indicative of improved muscle extensibility and reduction in tightness.  Baseline: see above  Goal status: INITIAL  4.  Patient will be independent with advanced home program to assist in management of her chronic condition.  Baseline: initial HEP issued  Goal status: INITIAL   PLAN: PT FREQUENCY: 1x/week  PT DURATION: 8 weeks  PLANNED INTERVENTIONS: Therapeutic  exercises, Therapeutic activity, Neuromuscular re-education, Patient/Family education, Self Care, Joint mobilization, Dry Needling, Spinal manipulation, Spinal mobilization, Cryotherapy, Moist heat, Manual therapy, and Re-evaluation  PLAN FOR NEXT SESSION: review and progress HEP prn; TPDN to pecs, lats; manual to chest musculature, postural strengthening    Letitia Libra, PT, DPT, ATC 11/20/22 11:04 AM

## 2022-11-20 ENCOUNTER — Ambulatory Visit: Payer: BC Managed Care – PPO

## 2022-11-20 DIAGNOSIS — M25611 Stiffness of right shoulder, not elsewhere classified: Secondary | ICD-10-CM

## 2022-11-20 DIAGNOSIS — M6281 Muscle weakness (generalized): Secondary | ICD-10-CM

## 2022-11-20 DIAGNOSIS — R293 Abnormal posture: Secondary | ICD-10-CM

## 2022-11-20 DIAGNOSIS — N644 Mastodynia: Secondary | ICD-10-CM

## 2022-11-23 ENCOUNTER — Encounter (INDEPENDENT_AMBULATORY_CARE_PROVIDER_SITE_OTHER): Payer: Self-pay | Admitting: Family Medicine

## 2022-11-23 ENCOUNTER — Ambulatory Visit (INDEPENDENT_AMBULATORY_CARE_PROVIDER_SITE_OTHER): Payer: BC Managed Care – PPO | Admitting: Family Medicine

## 2022-11-23 VITALS — BP 109/74 | HR 85 | Temp 98.9°F | Ht 64.0 in | Wt 165.0 lb

## 2022-11-23 DIAGNOSIS — E559 Vitamin D deficiency, unspecified: Secondary | ICD-10-CM | POA: Diagnosis not present

## 2022-11-23 DIAGNOSIS — F3289 Other specified depressive episodes: Secondary | ICD-10-CM

## 2022-11-23 DIAGNOSIS — Z6828 Body mass index (BMI) 28.0-28.9, adult: Secondary | ICD-10-CM

## 2022-11-23 DIAGNOSIS — R632 Polyphagia: Secondary | ICD-10-CM

## 2022-11-23 DIAGNOSIS — I10 Essential (primary) hypertension: Secondary | ICD-10-CM | POA: Diagnosis not present

## 2022-11-23 MED ORDER — OLMESARTAN MEDOXOMIL 40 MG PO TABS: 40.0000 mg | ORAL_TABLET | Freq: Every day | ORAL | 0 refills | Status: DC

## 2022-11-23 MED ORDER — VITAMIN D (ERGOCALCIFEROL) 1.25 MG (50000 UNIT) PO CAPS: 50000.0000 [IU] | ORAL_CAPSULE | ORAL | 0 refills | Status: DC

## 2022-11-23 NOTE — Assessment & Plan Note (Signed)
Last vitamin D Lab Results  Component Value Date   VD25OH 54.4 01/28/2022   Her last vitamin D level January 2024 did reduce to 43.  She has been taking vitamin D 50,000 IU once weekly with a target goal 50-70.  She denies adverse side effects.  Will recheck vitamin D level in the next 1 to 2 months.

## 2022-11-23 NOTE — Assessment & Plan Note (Signed)
Improving on bupropion SR 150 mg every morning.  She denies adverse side effects.  She has felt improvements in mood and a reduction in emotional eating.  Continue work on mindful eating, stress reduction and proper sleep at night.  Continue bupropion SR 150 mg every morning.

## 2022-11-23 NOTE — Assessment & Plan Note (Signed)
Polyphasia has improved on Wegovy 1.7 mg.  She has spaced it to q. 14 days in preparation for coming off in the next 16 weeks as her insurance no longer covers Wegovy.  She is almost at her target weight.  She has been practicing mindful eating and portion control the week prior to her injection as practice for when she comes off [facility].  Continue to focus on eating on a schedule, lean protein intake with meals and increasing fiber with cooked vegetables and fruit.

## 2022-11-23 NOTE — Progress Notes (Signed)
Office: [phone]  /  Fax: [phone]  WEIGHT SUMMARY AND BIOMETRICS  No data recorded Starting Weight: 236lb   Weight Lost Since Last Visit: 11lb   Vitals Temp: 98.9 F (37.2 C) BP: 109/74 Pulse Rate: 85 SpO2: 100 %   Body Composition  Body Fat %: 36.7 % Fat Mass (lbs): 60.6 lbs Muscle Mass (lbs): 99.2 lbs Total Body Water (lbs): 69.6 lbs Visceral Fat Rating : 7   HPI  Chief Complaint: OBESITY  Stacey Singh is here to discuss her progress with her obesity treatment plan. She is on the the Category 2 Plan and states she is following her eating plan approximately 85 % of the time. She states she is exercising 30-60 minutes 3 times per week.   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  Pharmacotherapy: Wegovy 1.7 mg q. 14 days  PHYSICAL EXAM:  Blood pressure 109/74, pulse 85, temperature 98.9 F (37.2 C), height 5\' 4"  (1.626 m), weight 165 lb (74.8 kg), SpO2 100 %. Body mass index is 28.32 kg/m.  General: She is overweight, cooperative, alert, well developed, and in no acute distress. PSYCH: Has normal mood, affect and thought process.   Lungs: Normal breathing effort, no conversational dyspnea.   ASSESSMENT AND PLAN  TREATMENT PLAN FOR OBESITY:  Recommended Dietary Goals  Stacey Singh is  currently in the action stage of change. As such, her goal is to continue weight management plan. She has agreed to the Category 2 Plan.  Behavioral Intervention  We discussed the following Behavioral Modification Strategies today: increasing lean protein intake, decreasing simple carbohydrates , increasing vegetables, increasing lower glycemic fruits, avoiding skipping meals, increasing water intake, work on meal planning and preparation, continue to practice mindfulness when eating, and planning for success.  Additional resources provided today: NA  Recommended Physical Activity Goals  Stacey Singh has been advised to work up to 150 minutes of moderate intensity aerobic activity a week and strengthening exercises 2-3 times per week for cardiovascular health, weight loss maintenance and preservation of muscle mass.   She has agreed to [employer] strengthening exercises with a goal of 2-3 sessions a week   Pharmacotherapy changes for the treatment of obesity: None  ASSOCIATED CONDITIONS ADDRESSED TODAY  Other depression with emotional eating Assessment & Plan: Improving on bupropion SR 150 mg every morning.  She denies adverse side effects.  She has felt improvements in mood and a reduction in emotional eating.  Continue work on mindful eating, stress reduction and proper sleep at night.  Continue bupropion SR 150 mg every morning.   Vitamin D deficiency Assessment & Plan: Last vitamin D Lab Results  Component Value Date   VD25OH 54.4 01/28/2022   Her last vitamin D level January 2024 did reduce to  43.  She has been taking vitamin D 50,000 IU once weekly with a target goal 50-70.  She denies adverse side effects.  Will recheck vitamin D level in the next 1 to 2 months.  Orders: -     Vitamin D (Ergocalciferol); Take 1 capsule (50,000 Units total) by mouth every 7 (seven) days.  Dispense: 12 capsule; Refill: 0  Morbid obesity: Starting BMI 40.4  Polyphagia Assessment & Plan: Polyphasia  has improved on Wegovy 1.7 mg.  She has spaced it to q. 14 days in preparation for coming off in the next 16 weeks as her insurance no longer covers Wegovy.  She is almost at her target weight.  She has been practicing mindful eating and portion control the week prior to her injection as practice for when she comes off [facility].  Continue to focus on eating on a schedule, lean protein intake with meals and increasing fiber with cooked vegetables and fruit.   BMI 28.0-28.9,adult  Benign essential HTN Assessment & Plan: Her blood pressure is well-controlled on olmesartan 40 mg once daily.  She denies dizziness, weakness or syncope.  She has been working on weight reduction and now has a BMI less than 30.  Congratulated her on her healthy eating, regular exercise and weight reduction.  Refilled olmesartan 40 mg once daily.  Consider reducing her dose to one half tab next visit.  Orders: -     Olmesartan Medoxomil; Take 1 tablet (40 mg total) by mouth daily.  Dispense: 90 tablet; Refill: 0      She was informed of the importance of frequent follow up visits to maximize her success with intensive lifestyle modifications for her multiple health conditions.   ATTESTASTION STATEMENTS:  Reviewed by clinician on day of visit: allergies, medications, problem list, medical history, surgical history, family history, social history, and previous encounter notes pertinent to obesity diagnosis.   I have personally spent 30 minutes total time today in preparation, patient care, nutritional counseling and documentation for this visit, including the following: review of clinical lab tests; review of medical tests/procedures/services.      Glennis Brink, DO DABFM, DABOM [facility] [address] [place], [place] [address] [phone]

## 2022-11-23 NOTE — Assessment & Plan Note (Signed)
Her blood pressure is well-controlled on olmesartan 40 mg once daily.  She denies dizziness, weakness or syncope.  She has been working on weight reduction and now has a BMI less than 30.  Congratulated her on her healthy eating, regular exercise and weight reduction.  Refilled olmesartan 40 mg once daily.  Consider reducing her dose to one half tab next visit.

## 2022-11-26 NOTE — Therapy (Signed)
OUTPATIENT PHYSICAL THERAPY UPPER EXTREMITY TREATMENT   Patient Name: Stacey Singh MRN: [id number] DOB:04-28-1976, 47 y.o., female Today's Date: 11/27/2022  END OF SESSION:  PT End of Session - 11/27/22 0848     Visit Number 4    Number of Visits 9    Date for PT Re-Evaluation 01/02/23    Authorization Type BCBS    PT Start Time 0848    PT Stop Time 0929    PT Time Calculation (min) 41 min    Activity Tolerance Patient tolerated treatment well    Behavior During Therapy [facility] for tasks assessed/performed                Past Medical History:  Diagnosis Date   Allergy    Cancer (HCC)    breast   Family history of breast cancer    Family history of leukemia    Female infertility    Heart murmur    in high school, resolved   History of COVID-19 08/05/2020   Hypertension    Past Surgical History:  Procedure Laterality Date   BREAST CYST EXCISION Bilateral 10/09/2020   Procedure: EXCISION OF EXCESS BREAST TISSUE;  Surgeon: Peggye Form, DO;  Location: [facility];  Service: Plastics;  Laterality: Bilateral;   BREAST RECONSTRUCTION WITH PLACEMENT OF TISSUE EXPANDER AND FLEX HD (ACELLULAR HYDRATED DERMIS) Bilateral 03/13/2020   Procedure: BILATERAL BREAST RECONSTRUCTION WITH PLACEMENT OF TISSUE EXPANDERS AND FLEX HD (ACELLULAR HYDRATED DERMIS);  Surgeon: Peggye Form, DO;  Location: [facility];  Service: Plastics;  Laterality: Bilateral;   CESAREAN SECTION  04/04/2012   Procedure: CESAREAN SECTION;  Surgeon: Freddrick March. Tenny Craw, MD;  Location: [facility];  Service: Obstetrics;  Laterality: N/A;   CESAREAN SECTION N/A 05/19/2015   Procedure: CESAREAN SECTION;  Surgeon: Philip Aspen, DO;  Location: [facility];  Service: Obstetrics;  Laterality: N/A;   CESAREAN SECTION     DILATION AND CURETTAGE OF UTERUS     LIPOSUCTION WITH LIPOFILLING Bilateral 10/09/2020   Procedure: LIPOSUCTION WITH LIPOFILLING;  Surgeon: Peggye Form, DO;   Location: [facility];  Service: Plastics;  Laterality: Bilateral;  3 hours total, please   MASTECTOMY W/ SENTINEL NODE BIOPSY Bilateral 03/13/2020   Procedure: BILATERAL MASTECTOMY WITH RIGHT SENTINEL LYMPH NODE BIOPSY;  Surgeon: Griselda Miner, MD;  Location: [facility];  Service: General;  Laterality: Bilateral;  PEC BLOCK   REMOVAL OF BILATERAL TISSUE EXPANDERS WITH PLACEMENT OF BILATERAL BREAST IMPLANTS Bilateral 06/13/2020   Procedure: REMOVAL OF BILATERAL TISSUE EXPANDERS WITH PLACEMENT OF BILATERAL BREAST IMPLANTS;  Surgeon: Peggye Form, DO;  Location: [facility];  Service: Plastics;  Laterality: Bilateral;   WISDOM TOOTH EXTRACTION     Patient Active Problem List   Diagnosis Date Noted   Nausea 07/28/2022   Polyphagia 07/28/2022   Dyspepsia 06/09/2022   Other constipation 04/30/2022   Other hyperlipidemia 02/17/2022   Vitamin D deficiency 12/24/2021   Depression 12/24/2021   Insulin resistance 10/27/2021   Eating disorder 10/27/2021   S/P breast reconstruction, bilateral 07/17/2020   Acquired absence of breast 03/22/2020   Breast cancer (HCC) 03/13/2020   Genetic testing 01/05/2020   History of ductal carcinoma in situ (DCIS) of breast 01/03/2020   Family history of breast cancer    Family history of leukemia    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   Morbid obesity (HCC) 12/29/2017   Benign essential HTN 06/13/2015  PCP: Larkin Ina  REFERRING PROVIDER: Griselda Miner, MD   REFERRING DIAG: [phone] (ICD-10-CM) - Intraductal carcinoma in situ of right breast   THERAPY DIAG:  Stiffness of right shoulder, not elsewhere classified  Breast pain, right  Abnormal posture  Muscle weakness (generalized)  Rationale for Evaluation and Treatment: Rehabilitation  ONSET DATE: 2021   SUBJECTIVE:                                                                                                                                                                                       SUBJECTIVE STATEMENT: "It feels pretty good."   Hand dominance: Right  PERTINENT HISTORY: Bilateral mastectomy with reconstruction 2021 for ductal carcinoma in situ Rt breast   PAIN:  Are you having pain?  Yes: NPRS scale: 4/10 Pain location: Rt superolatearl breast Pain description: discomfort Aggravating factors: bending Relieving factors: unknown  PRECAUTIONS: None  WEIGHT BEARING RESTRICTIONS: No  FALLS:  Has patient fallen in last 6 months? 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   PLOF: Independent  PATIENT GOALS: "less discomfort"    OBJECTIVE:   DIAGNOSTIC FINDINGS:  Bilateral breast MRI 2023   IMPRESSION: 1. Postsurgical changes related to bilateral mastectomies with implant reconstruction. No MRI evidence of malignancy or any other abnormalities in the reconstructed breasts.   2.  Cholelithiasis.  PATIENT SURVEYS :  FOTO 76% function to 73% predicted   COGNITION: Overall cognitive status: Within functional limits for tasks assessed     SENSATION: Not tested  POSTURE: Forward head/rounded shoulders   UPPER EXTREMITY ROM:   Active ROM Right eval Left eval 11/27/22  Shoulder flexion WNL "pull"    Shoulder extension     Shoulder abduction WNL "pull"    Shoulder adduction     Shoulder internal rotation 60  65  Shoulder external rotation 85    Elbow flexion     Elbow extension     Wrist flexion     Wrist extension     Wrist ulnar deviation     Wrist radial deviation     Wrist pronation     Wrist supination     (Blank rows = not tested)  UPPER EXTREMITY MMT:  MMT Right eval Left eval  Shoulder flexion 4+   Lat 4    Shoulder abduction 5   Shoulder horizontal adduction 4    Shoulder internal rotation 5   Shoulder external rotation 5   Middle trapezius 4 4  Lower trapezius  4- 4-  Elbow flexion    Elbow extension    Wrist flexion    Wrist extension    Wrist ulnar deviation    Wrist radial deviation    Wrist pronation    Wrist supination    Grip strength (lbs)    (Blank rows = not tested)  SHOULDER SPECIAL TESTS: Latissimus dorsi muscle length assessment: 135 degrees  Table to acromion distance: 11 cm Rt; 10 cm Lt   11/20/22: lat length: 145 degrees   JOINT MOBILITY TESTING:  Hypomobility PAIVM thoracic spine   PALPATION:  Tautness and palpable tenderness Rt pectorals, latissimus dorsi   [facility] Adult PT Treatment:                                                DATE: 11/27/22 Therapeutic Exercise: Cat/cow x 10  Diaphragmatic breathing x 10  Childs pose x 1 minute  Sleeper stretch 2 x 30 sec  Bilateral shoulder ER green band 2 x 10  Manual Therapy: IASTM to pectorals, anterior deltoid  DTM/trigger point release pectorals, lat, subscapularis Passive Rt shoulder ROM Passive lat and pec stetching  Trigger Point Dry Needling Treatment: Pre-treatment instruction: Patient instructed on dry needling rationale, procedures, and possible side effects including pain during treatment (achy,cramping feeling), bruising, drop of blood, lightheadedness, nausea, sweating. Patient Consent Given: Yes Education handout provided: Previously provided Muscles treated: Rt pectorals and lat  Needle size and number: .30x28mm x 1 and .25x102mm x 1 Electrical stimulation performed: No Parameters: N/A Treatment response/outcome: Twitch response elicited and Palpable decrease in muscle tension Post-treatment instructions: Patient instructed to expect possible mild to moderate muscle soreness later today and/or tomorrow. Patient instructed in methods to reduce muscle soreness and to continue prescribed HEP. If patient was dry needled over the lung field, patient was instructed  on signs and symptoms of pneumothorax and, however unlikely, to see immediate medical attention should they occur. Patient was also educated on signs and symptoms of infection and to seek medical attention should they occur. Patient verbalized understanding of these instructions and education.     [facility] Adult PT Treatment:                                                DATE: 11/20/22 Therapeutic Exercise: Pec stretch on roller x 2 minutes Childs pose x 1 minute  HEP review Manual Therapy: IASTM to pectorals DTM/trigger point release pectorals, lat, subscapularis Passive Rt shoulder ROM Passive lat and pec stetching  Trigger Point Dry Needling Treatment: Pre-treatment instruction: Patient instructed on dry needling rationale, procedures, and possible side effects including pain during treatment (achy,cramping feeling), bruising, drop of blood, lightheadedness, nausea, sweating. Patient Consent Given: Yes Education handout provided: Previously provided Muscles treated: Rt pec major/minor   Needle size and number: .30x17mm x 1 and .25x61mm x 1 Electrical stimulation performed: No Parameters: N/A Treatment response/outcome: Twitch response elicited and Palpable decrease in muscle tension Post-treatment instructions: Patient instructed to expect possible mild to moderate muscle soreness later today and/or tomorrow. Patient instructed in methods to reduce muscle soreness and to continue prescribed HEP. If patient was dry needled over the lung field, patient was instructed on signs and symptoms of pneumothorax and, however unlikely, to  see immediate medical attention should they occur. Patient was also educated on signs and symptoms of infection and to seek medical attention should they occur. Patient verbalized understanding of these instructions and education.  Self Care: Posture shirt and bras that could assist in improving postural awareness  [facility] Adult PT Treatment:                                                 DATE: 11-12-22 Therapeutic Exercise: Doorway Pec Stretch at 90 Degrees Abduction  30 sec  hold Standing Bent Over Shoulder Row  2 sets - 10 reps 8 # Supine Scapular Protraction in Flexion with Dumbbells  8 # 2 sets - 10 reps Side lying Thoracic Rotation with Open Book  - 1 x daily  Manual Therapy: STW  myofasical release of R minor and major pec, R subclavius  Long arm distractio of R UE  STW over R minor and major pec, R subclavius Trigger Point Dry-Needling performed     by Garen Lah Treatment instructions: Expect mild to moderate muscle soreness. S/S of pneumothorax if dry needled over a lung field, and to seek immediate medical attention should they occur. Patient verbalized understanding of these instructions and education.  Patient Consent Given: Yes Education handout provided: Previously provided Muscles treated: R pec minor and major , R subclavius Electrical stimulation performed: No Parameters: N/A Treatment response/outcome: twitch response noted, pt noted relief Modality moist heat pack to R shld  [facility] Adult PT Treatment:                                                DATE: 11/04/22 Therapeutic Exercise: Demonstrated and issue initial HEP.   Manual Therapy: STM/Trigger point release pectorals and latissimus dorsi Active release lat   Therapeutic Activity: Education on assessment findings that will be addressed throughout duration of POC.     PATIENT EDUCATION: Education details: HEP review; TPDN Person educated: Patient Education method: Explanation Education comprehension: verbalized understanding  HOME EXERCISE PROGRAM: Access Code: [email or web address] URL: [email or web address] Date: 11/05/2022 Prepared by: Letitia Libra  Exercises - Doorway Pec Stretch at 90 Degrees Abduction  - 1 x daily - 7 x weekly - 3 sets - 30 sec  hold - Standing Bent Over Shoulder Row  - 1 x daily - 7 x weekly - 2 sets - 10 reps - Supine Scapular  Protraction in Flexion with Dumbbells  - 1 x daily - 7 x weekly - 2 sets - 10 reps - Sidelying Thoracic Rotation with Open Book  - 1 x daily - 7 x weekly - 1 sets - 10 reps  ASSESSMENT:  CLINICAL IMPRESSION: Continued emphasis on manual therapy techniques and TPDN aimed at reducing Rt chest/shoulder musculature tautness. Excellent twitch response elicited with TPDN to the pec and lat today with minimal soreness reported post-intervention. She demonstrates improvement in Rt shoulder IR AROM, but remains slightly limited. Good tolerance to ther ex, which included trunk mobility and postural strengthening.   OBJECTIVE IMPAIRMENTS: decreased activity tolerance, decreased mobility, decreased ROM, decreased strength, hypomobility, increased fascial restrictions, impaired flexibility, postural dysfunction, and pain.   ACTIVITY LIMITATIONS: bending and reach over head  PARTICIPATION LIMITATIONS: community activity and occupation  PERSONAL FACTORS: Age,  Fitness, Time since onset of injury/illness/exacerbation, and 1 comorbidity: see PMH above  are also affecting patient's functional outcome.   REHAB POTENTIAL: Good  CLINICAL DECISION MAKING: Stable/uncomplicated  EVALUATION COMPLEXITY: Low  GOALS: Goals reviewed with patient? Yes  SHORT TERM GOALS: Target date: 12/03/2022    Patient will be independent and compliant with initial HEP.   Baseline: issued at eval  Goal status: INITIAL  2.  Patient will demonstrate at least 70 degrees of Rt shoulder IR AROM to improve ability to complete self-care activities.  Baseline: see above  Goal status: INITIAL  3.  Patient will demonstrate </= 10 cm with table to acromion test on the Rt to signify a reduction in tightness in chest musculature.  Baseline: see above  Goal status: INITIAL   LONG TERM GOALS: Target date: 01/02/2023    Patient will report tightness/discomfort at worst as </=4/10 to reduce her current functional limitations.   Baseline: see above  Goal status: INITIAL  2.  Patient will demonstrate 5/5 middle trap strength to improve postural stability.  Baseline: see above  Goal status: INITIAL  3.  Patient will improve lat length by at least 10 degrees indicative of improved muscle extensibility and reduction in tightness.  Baseline: see above  Goal status: INITIAL  4.  Patient will be independent with advanced home program to assist in management of her chronic condition.  Baseline: initial HEP issued  Goal status: INITIAL   PLAN: PT FREQUENCY: 1x/week  PT DURATION: 8 weeks  PLANNED INTERVENTIONS: Therapeutic exercises, Therapeutic activity, Neuromuscular re-education, Patient/Family education, Self Care, Joint mobilization, Dry Needling, Spinal manipulation, Spinal mobilization, Cryotherapy, Moist heat, Manual therapy, and Re-evaluation  PLAN FOR NEXT SESSION: review and progress HEP prn; TPDN to pecs, lats; manual to chest musculature, postural strengthening    Letitia Libra, PT, DPT, ATC 11/27/22 9:30 AM

## 2022-11-27 ENCOUNTER — Ambulatory Visit: Payer: BC Managed Care – PPO | Attending: Physician Assistant

## 2022-11-27 DIAGNOSIS — N644 Mastodynia: Secondary | ICD-10-CM | POA: Diagnosis present

## 2022-11-27 DIAGNOSIS — M25611 Stiffness of right shoulder, not elsewhere classified: Secondary | ICD-10-CM | POA: Insufficient documentation

## 2022-11-27 DIAGNOSIS — R293 Abnormal posture: Secondary | ICD-10-CM | POA: Diagnosis present

## 2022-11-27 DIAGNOSIS — M6281 Muscle weakness (generalized): Secondary | ICD-10-CM | POA: Insufficient documentation

## 2022-12-04 ENCOUNTER — Ambulatory Visit: Payer: BC Managed Care – PPO

## 2022-12-04 DIAGNOSIS — M25611 Stiffness of right shoulder, not elsewhere classified: Secondary | ICD-10-CM

## 2022-12-04 DIAGNOSIS — M6281 Muscle weakness (generalized): Secondary | ICD-10-CM

## 2022-12-04 DIAGNOSIS — N644 Mastodynia: Secondary | ICD-10-CM

## 2022-12-04 DIAGNOSIS — R293 Abnormal posture: Secondary | ICD-10-CM

## 2022-12-04 NOTE — Therapy (Signed)
OUTPATIENT PHYSICAL THERAPY TREATMENT   Patient Name: Stacey Singh MRN: [id number] DOB:Jul 12, 1976, 46 y.o., female Today's Date: 12/04/2022  END OF SESSION:  PT End of Session - 12/04/22 0850     Visit Number 5    Number of Visits 9    Date for PT Re-Evaluation 01/02/23    Authorization Type BCBS    PT Start Time [phone]    PT Stop Time 0935    PT Time Calculation (min) 46 min    Activity Tolerance Patient tolerated treatment well    Behavior During Therapy [facility] for tasks assessed/performed                 Past Medical History:  Diagnosis Date   Allergy    Cancer (HCC)    breast   Family history of breast cancer    Family history of leukemia    Female infertility    Heart murmur    in high school, resolved   History of COVID-19 08/05/2020   Hypertension    Past Surgical History:  Procedure Laterality Date   BREAST CYST EXCISION Bilateral 10/09/2020   Procedure: EXCISION OF EXCESS BREAST TISSUE;  Surgeon: Peggye Form, DO;  Location: [facility];  Service: Plastics;  Laterality: Bilateral;   BREAST RECONSTRUCTION WITH PLACEMENT OF TISSUE EXPANDER AND FLEX HD (ACELLULAR HYDRATED DERMIS) Bilateral 03/13/2020   Procedure: BILATERAL BREAST RECONSTRUCTION WITH PLACEMENT OF TISSUE EXPANDERS AND FLEX HD (ACELLULAR HYDRATED DERMIS);  Surgeon: Peggye Form, DO;  Location: [facility];  Service: Plastics;  Laterality: Bilateral;   CESAREAN SECTION  04/04/2012   Procedure: CESAREAN SECTION;  Surgeon: Freddrick March. Tenny Craw, MD;  Location: [facility];  Service: Obstetrics;  Laterality: N/A;   CESAREAN SECTION N/A 05/19/2015   Procedure: CESAREAN SECTION;  Surgeon: Philip Aspen, DO;  Location: [facility];  Service: Obstetrics;  Laterality: N/A;   CESAREAN SECTION     DILATION AND CURETTAGE OF UTERUS     LIPOSUCTION WITH LIPOFILLING Bilateral 10/09/2020   Procedure: LIPOSUCTION WITH LIPOFILLING;  Surgeon: Peggye Form, DO;  Location: [facility];  Service: Plastics;  Laterality: Bilateral;  3 hours total, please   MASTECTOMY W/ SENTINEL NODE BIOPSY Bilateral 03/13/2020   Procedure: BILATERAL MASTECTOMY WITH RIGHT SENTINEL LYMPH NODE BIOPSY;  Surgeon: Griselda Miner, MD;  Location: [facility];  Service: General;  Laterality: Bilateral;  PEC BLOCK   REMOVAL OF BILATERAL TISSUE EXPANDERS WITH PLACEMENT OF BILATERAL BREAST IMPLANTS Bilateral 06/13/2020   Procedure: REMOVAL OF BILATERAL TISSUE EXPANDERS WITH PLACEMENT OF BILATERAL BREAST IMPLANTS;  Surgeon: Peggye Form, DO;  Location: [facility];  Service: Plastics;  Laterality: Bilateral;   WISDOM TOOTH EXTRACTION     Patient Active Problem List   Diagnosis Date Noted   Nausea 07/28/2022   Polyphagia 07/28/2022   Dyspepsia 06/09/2022   Other constipation 04/30/2022   Other hyperlipidemia 02/17/2022   Vitamin D deficiency 12/24/2021   Depression 12/24/2021   Insulin resistance 10/27/2021   Eating disorder 10/27/2021   S/P breast reconstruction, bilateral 07/17/2020   Acquired absence of breast 03/22/2020   Breast cancer (HCC) 03/13/2020   Genetic testing 01/05/2020   History of ductal carcinoma in situ (DCIS) of breast 01/03/2020   Family history of breast cancer    Family history of leukemia    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   Morbid obesity (HCC) 12/29/2017   Benign essential HTN 06/13/2015  PCP: Larkin Ina  REFERRING PROVIDER: Griselda Miner, MD   REFERRING DIAG: [phone] (ICD-10-CM) - Intraductal carcinoma in situ of right breast   THERAPY DIAG:  Stiffness of right shoulder, not elsewhere classified  Breast pain, right  Abnormal posture  Muscle weakness (generalized)  Rationale for Evaluation and Treatment: Rehabilitation  ONSET DATE: 2021   SUBJECTIVE:                                                                                                                                                                                       SUBJECTIVE STATEMENT: "It feels better."   Hand dominance: Right  PERTINENT HISTORY: Bilateral mastectomy with reconstruction 2021 for ductal carcinoma in situ Rt breast   PAIN:  Are you having pain?  Yes: NPRS scale: 3/10 Pain location: Rt anterior chest (localized to sternal attachment of pectorals) Pain description: discomfort Aggravating factors: bending Relieving factors: unknown  PRECAUTIONS: None  WEIGHT BEARING RESTRICTIONS: No  FALLS:  Has patient fallen in last 6 months? 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   PLOF: Independent  PATIENT GOALS: "less discomfort"    OBJECTIVE:   DIAGNOSTIC FINDINGS:  Bilateral breast MRI 2023   IMPRESSION: 1. Postsurgical changes related to bilateral mastectomies with implant reconstruction. No MRI evidence of malignancy or any other abnormalities in the reconstructed breasts.   2.  Cholelithiasis.  PATIENT SURVEYS :  FOTO 76% function to 73% predicted   COGNITION: Overall cognitive status: Within functional limits for tasks assessed     SENSATION: Not tested  POSTURE: Forward head/rounded shoulders   UPPER EXTREMITY ROM:   Active ROM Right eval Left eval 11/27/22 12/04/22 Right   Shoulder flexion WNL "pull"     Shoulder extension      Shoulder abduction WNL "pull"     Shoulder adduction      Shoulder internal rotation 60  65 80  Shoulder external rotation 85     Elbow flexion      Elbow extension      Wrist flexion      Wrist extension      Wrist ulnar deviation      Wrist radial deviation      Wrist pronation      Wrist supination      (Blank rows = not tested)  UPPER EXTREMITY MMT:  MMT Right eval Left eval  Shoulder flexion 4+   Lat 4    Shoulder  abduction 5   Shoulder horizontal adduction  4   Shoulder internal rotation 5   Shoulder external rotation 5   Middle trapezius 4 4  Lower trapezius 4- 4-  Elbow flexion    Elbow extension    Wrist flexion    Wrist extension    Wrist ulnar deviation    Wrist radial deviation    Wrist pronation    Wrist supination    Grip strength (lbs)    (Blank rows = not tested)  SHOULDER SPECIAL TESTS: Latissimus dorsi muscle length assessment: 135 degrees  Table to acromion distance: 11 cm Rt; 10 cm Lt   11/20/22: lat length: 145 degrees   12/04/22: table to acromion 10 cm Rt  JOINT MOBILITY TESTING:  Hypomobility PAIVM thoracic spine   PALPATION:  Tautness and palpable tenderness Rt pectorals, latissimus dorsi  [facility] Adult PT Treatment:                                                DATE: 12/04/22 Therapeutic Exercise: Sidelying trunk rotation x 10 each  Standing serratus punch 2 x 10 black band Updated HEP  Manual Therapy: IASTM Rt pectorals, lats, subscapularis, anterior deltoid Trigger point release Rt pectorals  Rt pec/lat stretching  Trigger Point Dry Needling Treatment: Pre-treatment instruction: Patient instructed on dry needling rationale, procedures, and possible side effects including pain during treatment (achy,cramping feeling), bruising, drop of blood, lightheadedness, nausea, sweating. Patient Consent Given: Yes Education handout provided: Previously provided Muscles treated: Rt pectorals   Needle size and number: .25x33mm x 1 Electrical stimulation performed: No Parameters: N/A Treatment response/outcome:  unable to piston as patient was unable to relax once needle was inserted Post-treatment instructions: Patient instructed to expect possible mild to moderate muscle soreness later today and/or tomorrow. Patient instructed in methods to reduce muscle soreness and to continue prescribed HEP. If patient was dry needled over the lung field, patient was instructed on signs  and symptoms of pneumothorax and, however unlikely, to see immediate medical attention should they occur. Patient was also educated on signs and symptoms of infection and to seek medical attention should they occur. Patient verbalized understanding of these instructions and education.   [facility] Adult PT Treatment:                                                DATE: 11/27/22 Therapeutic Exercise: Cat/cow x 10  Diaphragmatic breathing x 10  Childs pose x 1 minute  Sleeper stretch 2 x 30 sec  Bilateral shoulder ER green band 2 x 10  Manual Therapy: IASTM to pectorals, anterior deltoid  DTM/trigger point release pectorals, lat, subscapularis Passive Rt shoulder ROM Passive lat and pec stetching  Trigger Point Dry Needling Treatment: Pre-treatment instruction: Patient instructed on dry needling rationale, procedures, and possible side effects including pain during treatment (achy,cramping feeling), bruising, drop of blood, lightheadedness, nausea, sweating. Patient Consent Given: Yes Education handout provided: Previously provided Muscles treated: Rt pectorals and lat  Needle size and number: .30x60mm x 1 and .25x57mm x 1 Electrical stimulation performed: No Parameters: N/A Treatment response/outcome: Twitch response elicited and Palpable decrease in muscle tension Post-treatment instructions: Patient instructed to expect possible mild to moderate muscle soreness later today and/or tomorrow. Patient instructed in methods to reduce muscle  soreness and to continue prescribed HEP. If patient was dry needled over the lung field, patient was instructed on signs and symptoms of pneumothorax and, however unlikely, to see immediate medical attention should they occur. Patient was also educated on signs and symptoms of infection and to seek medical attention should they occur. Patient verbalized understanding of these instructions and education.     [facility] Adult PT Treatment:                                                 DATE: 11/20/22 Therapeutic Exercise: Pec stretch on roller x 2 minutes Childs pose x 1 minute  HEP review Manual Therapy: IASTM to pectorals DTM/trigger point release pectorals, lat, subscapularis Passive Rt shoulder ROM Passive lat and pec stetching  Trigger Point Dry Needling Treatment: Pre-treatment instruction: Patient instructed on dry needling rationale, procedures, and possible side effects including pain during treatment (achy,cramping feeling), bruising, drop of blood, lightheadedness, nausea, sweating. Patient Consent Given: Yes Education handout provided: Previously provided Muscles treated: Rt pec major/minor   Needle size and number: .30x3mm x 1 and .25x74mm x 1 Electrical stimulation performed: No Parameters: N/A Treatment response/outcome: Twitch response elicited and Palpable decrease in muscle tension Post-treatment instructions: Patient instructed to expect possible mild to moderate muscle soreness later today and/or tomorrow. Patient instructed in methods to reduce muscle soreness and to continue prescribed HEP. If patient was dry needled over the lung field, patient was instructed on signs and symptoms of pneumothorax and, however unlikely, to see immediate medical attention should they occur. Patient was also educated on signs and symptoms of infection and to seek medical attention should they occur. Patient verbalized understanding of these instructions and education.  Self Care: Posture shirt and bras that could assist in improving postural awareness    PATIENT EDUCATION: Education details: HEP update  Person educated: Patient Education method: Explanation, demo, cues, handout Education comprehension: verbalized understanding, returned demo, cues   HOME EXERCISE PROGRAM: Access Code: [email or web address] URL: [email or web address]   ASSESSMENT:  CLINICAL IMPRESSION: Continued emphasis on reducing chest/Rt shoulder musculature tautness  today. TPDN was performed to Rt pectorals (origin) with fair tolerance as patient was unable to relax with needling to this region, so this was discontinued. Better tolerance to IASTM and trigger point release to this region. She demonstrates significant improvement in Rt shoulder IR AROM and pectoral length has improved, having met these short term functional goals. HEP was updated to include further stretching and strengthening.   OBJECTIVE IMPAIRMENTS: decreased activity tolerance, decreased mobility, decreased ROM, decreased strength, hypomobility, increased fascial restrictions, impaired flexibility, postural dysfunction, and pain.   ACTIVITY LIMITATIONS: bending and reach over head  PARTICIPATION LIMITATIONS: community activity and occupation  PERSONAL FACTORS: Age, Fitness, Time since onset of injury/illness/exacerbation, and 1 comorbidity: see PMH above  are also affecting patient's functional outcome.   REHAB POTENTIAL: Good  CLINICAL DECISION MAKING: Stable/uncomplicated  EVALUATION COMPLEXITY: Low  GOALS: Goals reviewed with patient? Yes  SHORT TERM GOALS: Target date: 12/03/2022    Patient will be independent and compliant with initial HEP.   Baseline: issued at eval  Goal status: met  2.  Patient will demonstrate at least 70 degrees of Rt shoulder IR AROM to improve ability to complete self-care activities.  Baseline: see above  Goal status: met  3.  Patient will demonstrate </= 10 cm with  table to acromion test on the Rt to signify a reduction in tightness in chest musculature.  Baseline: see above  Goal status: met   LONG TERM GOALS: Target date: 01/02/2023    Patient will report tightness/discomfort at worst as </=4/10 to reduce her current functional limitations.  Baseline: see above  Goal status: INITIAL  2.  Patient will demonstrate 5/5 middle trap strength to improve postural stability.  Baseline: see above  Goal status: INITIAL  3.  Patient will  improve lat length by at least 10 degrees indicative of improved muscle extensibility and reduction in tightness.  Baseline: see above  Goal status: INITIAL  4.  Patient will be independent with advanced home program to assist in management of her chronic condition.  Baseline: initial HEP issued  Goal status: INITIAL   PLAN: PT FREQUENCY: 1x/week  PT DURATION: 8 weeks  PLANNED INTERVENTIONS: Therapeutic exercises, Therapeutic activity, Neuromuscular re-education, Patient/Family education, Self Care, Joint mobilization, Dry Needling, Spinal manipulation, Spinal mobilization, Cryotherapy, Moist heat, Manual therapy, and Re-evaluation  PLAN FOR NEXT SESSION: review and progress HEP prn; TPDN to pecs, lats; manual to chest musculature, postural strengthening    Letitia Libra, PT, DPT, ATC 12/04/22 9:46 AM

## 2022-12-09 ENCOUNTER — Ambulatory Visit: Payer: BC Managed Care – PPO

## 2022-12-09 DIAGNOSIS — M6281 Muscle weakness (generalized): Secondary | ICD-10-CM

## 2022-12-09 DIAGNOSIS — R293 Abnormal posture: Secondary | ICD-10-CM

## 2022-12-09 DIAGNOSIS — N644 Mastodynia: Secondary | ICD-10-CM

## 2022-12-09 DIAGNOSIS — M25611 Stiffness of right shoulder, not elsewhere classified: Secondary | ICD-10-CM | POA: Diagnosis not present

## 2022-12-09 NOTE — Therapy (Signed)
OUTPATIENT PHYSICAL THERAPY TREATMENT   Patient Name: Stacey Singh MRN: [id number] DOB:Jul 16, 1976, 47 y.o., female Today's Date: 12/09/2022  END OF SESSION:  PT End of Session - 12/09/22 1402     Visit Number 6    Number of Visits 9    Date for PT Re-Evaluation 01/02/23    Authorization Type BCBS    PT Start Time 1402    PT Stop Time 1455    PT Time Calculation (min) 53 min    Activity Tolerance Patient tolerated treatment well    Behavior During Therapy WFL for tasks assessed/performed                 Past Medical History:  Diagnosis Date   Allergy    Cancer (HCC)    breast   Family history of breast cancer    Family history of leukemia    Female infertility    Heart murmur    in high school, resolved   History of COVID-19 08/05/2020   Hypertension    Past Surgical History:  Procedure Laterality Date   BREAST CYST EXCISION Bilateral 10/09/2020   Procedure: EXCISION OF EXCESS BREAST TISSUE;  Surgeon: Peggye Form, DO;  Location: [facility];  Service: Plastics;  Laterality: Bilateral;   BREAST RECONSTRUCTION WITH PLACEMENT OF TISSUE EXPANDER AND FLEX HD (ACELLULAR HYDRATED DERMIS) Bilateral 03/13/2020   Procedure: BILATERAL BREAST RECONSTRUCTION WITH PLACEMENT OF TISSUE EXPANDERS AND FLEX HD (ACELLULAR HYDRATED DERMIS);  Surgeon: Peggye Form, DO;  Location: [facility];  Service: Plastics;  Laterality: Bilateral;   CESAREAN SECTION  04/04/2012   Procedure: CESAREAN SECTION;  Surgeon: Freddrick March. Tenny Craw, MD;  Location: [facility];  Service: Obstetrics;  Laterality: N/A;   CESAREAN SECTION N/A 05/19/2015   Procedure: CESAREAN SECTION;  Surgeon: Philip Aspen, DO;  Location: [facility];  Service: Obstetrics;  Laterality: N/A;   CESAREAN SECTION     DILATION AND CURETTAGE OF UTERUS     LIPOSUCTION WITH LIPOFILLING Bilateral 10/09/2020   Procedure: LIPOSUCTION WITH LIPOFILLING;  Surgeon: Peggye Form, DO;  Location: [facility];  Service: Plastics;  Laterality: Bilateral;  3 hours total, please   MASTECTOMY W/ SENTINEL NODE BIOPSY Bilateral 03/13/2020   Procedure: BILATERAL MASTECTOMY WITH RIGHT SENTINEL LYMPH NODE BIOPSY;  Surgeon: Griselda Miner, MD;  Location: [facility];  Service: General;  Laterality: Bilateral;  PEC BLOCK   REMOVAL OF BILATERAL TISSUE EXPANDERS WITH PLACEMENT OF BILATERAL BREAST IMPLANTS Bilateral 06/13/2020   Procedure: REMOVAL OF BILATERAL TISSUE EXPANDERS WITH PLACEMENT OF BILATERAL BREAST IMPLANTS;  Surgeon: Peggye Form, DO;  Location: [facility];  Service: Plastics;  Laterality: Bilateral;   WISDOM TOOTH EXTRACTION     Patient Active Problem List   Diagnosis Date Noted   Nausea 07/28/2022   Polyphagia 07/28/2022   Dyspepsia 06/09/2022   Other constipation 04/30/2022   Other hyperlipidemia 02/17/2022   Vitamin D deficiency 12/24/2021   Depression 12/24/2021   Insulin resistance 10/27/2021   Eating disorder 10/27/2021   S/P breast reconstruction, bilateral 07/17/2020   Acquired absence of breast 03/22/2020   Breast cancer (HCC) 03/13/2020   Genetic testing 01/05/2020   History of ductal carcinoma in situ (DCIS) of breast 01/03/2020   Family history of breast cancer    Family history of leukemia    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   Morbid obesity (HCC) 12/29/2017   Benign essential HTN 06/13/2015  PCP: Larkin Ina  REFERRING PROVIDER: Griselda Miner, MD   REFERRING DIAG: [phone] (ICD-10-CM) - Intraductal carcinoma in situ of right breast   THERAPY DIAG:  Stiffness of right shoulder, not elsewhere classified  Breast pain, right  Abnormal posture  Muscle weakness (generalized)  Rationale for Evaluation and Treatment: Rehabilitation  ONSET DATE: 2021   SUBJECTIVE:                                                                                                                                                                                       SUBJECTIVE STATEMENT: "It feels good. I feel a little tightness here." (Rt medial pectoral region)  Hand dominance: Right  PERTINENT HISTORY: Bilateral mastectomy with reconstruction 2021 for ductal carcinoma in situ Rt breast   PAIN:  Are you having pain?  Yes: NPRS scale: 3/10 Pain location: Rt anterior chest (localized to sternal attachment of pectorals and lower ribcage) Pain description: tightness Aggravating factors: bending Relieving factors: unknown  PRECAUTIONS: None  WEIGHT BEARING RESTRICTIONS: No  FALLS:  Has patient fallen in last 6 months? 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   PLOF: Independent  PATIENT GOALS: "less discomfort"    OBJECTIVE:   DIAGNOSTIC FINDINGS:  Bilateral breast MRI 2023   IMPRESSION: 1. Postsurgical changes related to bilateral mastectomies with implant reconstruction. No MRI evidence of malignancy or any other abnormalities in the reconstructed breasts.   2.  Cholelithiasis.  PATIENT SURVEYS :  FOTO 76% function to 73% predicted  12/09/22: 81% function   COGNITION: Overall cognitive status: Within functional limits for tasks assessed     SENSATION: Not tested  POSTURE: Forward head/rounded shoulders   UPPER EXTREMITY ROM:   Active ROM Right eval Left eval 11/27/22 12/04/22 Right   Shoulder flexion WNL "pull"     Shoulder extension      Shoulder abduction WNL "pull"     Shoulder adduction      Shoulder internal rotation 60  65 80  Shoulder external rotation 85     Elbow flexion      Elbow extension      Wrist flexion      Wrist extension      Wrist ulnar deviation      Wrist radial deviation      Wrist pronation      Wrist supination      (Blank rows = not  tested)  UPPER EXTREMITY MMT:  MMT Right eval Left eval  Shoulder flexion  4+   Lat 4    Shoulder abduction 5   Shoulder horizontal adduction 4   Shoulder internal rotation 5   Shoulder external rotation 5   Middle trapezius 4 4  Lower trapezius 4- 4-  Elbow flexion    Elbow extension    Wrist flexion    Wrist extension    Wrist ulnar deviation    Wrist radial deviation    Wrist pronation    Wrist supination    Grip strength (lbs)    (Blank rows = not tested)  SHOULDER SPECIAL TESTS: Latissimus dorsi muscle length assessment: 135 degrees  Table to acromion distance: 11 cm Rt; 10 cm Lt   11/20/22: lat length: 145 degrees   12/04/22: table to acromion 10 cm Rt  JOINT MOBILITY TESTING:  Hypomobility PAIVM thoracic spine   PALPATION:  Tautness and palpable tenderness Rt pectorals, latissimus dorsi  [facility] Adult PT Treatment:                                                DATE: 12/09/22 Therapeutic Exercise: Thoracic extension on foam roller x 10 Cat cow x 10   Upper trap stretch x 30 sec  Lat pull down 2 x 10 @ 25 lbs  Resisted rows 1x 10 @ 25 lbs, 1 x 10 @ 35 lbs  Manual Therapy: CPAs grade II-III upper/mid T-spine STM/DTM thoracic paraspinals, rhomboids Rt scapular mobilizations upward/downward rotation, superior/inferior  J thrust cervicothoracic manipulation   Trigger Point Dry Needling Treatment: Pre-treatment instruction: Patient instructed on dry needling rationale, procedures, and possible side effects including pain during treatment (achy,cramping feeling), bruising, drop of blood, lightheadedness, nausea, sweating. Patient Consent Given: Yes Education handout provided: Previously provided Muscles treated: rt upper trap   Needle size and number: .30x24mm x 1 Electrical stimulation performed: No Parameters: N/A Treatment response/outcome: Twitch response elicited and Palpable decrease in muscle tension Post-treatment instructions: Patient instructed to  expect possible mild to moderate muscle soreness later today and/or tomorrow. Patient instructed in methods to reduce muscle soreness and to continue prescribed HEP. If patient was dry needled over the lung field, patient was instructed on signs and symptoms of pneumothorax and, however unlikely, to see immediate medical attention should they occur. Patient was also educated on signs and symptoms of infection and to seek medical attention should they occur. Patient verbalized understanding of these instructions and education.  Modalities:  MHP Rt upper trap x 10 minutes   [facility] Adult PT Treatment:                                                DATE: 12/04/22 Therapeutic Exercise: Sidelying trunk rotation x 10 each  Standing serratus punch 2 x 10 black band Updated HEP  Manual Therapy: IASTM Rt pectorals, lats, subscapularis, anterior deltoid Trigger point release Rt pectorals  Rt pec/lat stretching  Trigger Point Dry Needling Treatment: Pre-treatment instruction: Patient instructed on dry needling rationale, procedures, and possible side effects including pain during treatment (achy,cramping feeling), bruising, drop of blood, lightheadedness, nausea, sweating. Patient Consent Given: Yes Education handout provided: Previously provided Muscles treated: Rt pectorals   Needle size and number: .25x68mm x 1 Electrical stimulation performed: No Parameters: N/A Treatment response/outcome:  unable  to piston as patient was unable to relax once needle was inserted Post-treatment instructions: Patient instructed to expect possible mild to moderate muscle soreness later today and/or tomorrow. Patient instructed in methods to reduce muscle soreness and to continue prescribed HEP. If patient was dry needled over the lung field, patient was instructed on signs and symptoms of pneumothorax and, however unlikely, to see immediate medical attention should they occur. Patient was also educated on signs and symptoms  of infection and to seek medical attention should they occur. Patient verbalized understanding of these instructions and education.   [facility] Adult PT Treatment:                                                DATE: 11/27/22 Therapeutic Exercise: Cat/cow x 10  Diaphragmatic breathing x 10  Childs pose x 1 minute  Sleeper stretch 2 x 30 sec  Bilateral shoulder ER green band 2 x 10  Manual Therapy: IASTM to pectorals, anterior deltoid  DTM/trigger point release pectorals, lat, subscapularis Passive Rt shoulder ROM Passive lat and pec stetching  Trigger Point Dry Needling Treatment: Pre-treatment instruction: Patient instructed on dry needling rationale, procedures, and possible side effects including pain during treatment (achy,cramping feeling), bruising, drop of blood, lightheadedness, nausea, sweating. Patient Consent Given: Yes Education handout provided: Previously provided Muscles treated: Rt pectorals and lat  Needle size and number: .30x42mm x 1 and .25x21mm x 1 Electrical stimulation performed: No Parameters: N/A Treatment response/outcome: Twitch response elicited and Palpable decrease in muscle tension Post-treatment instructions: Patient instructed to expect possible mild to moderate muscle soreness later today and/or tomorrow. Patient instructed in methods to reduce muscle soreness and to continue prescribed HEP. If patient was dry needled over the lung field, patient was instructed on signs and symptoms of pneumothorax and, however unlikely, to see immediate medical attention should they occur. Patient was also educated on signs and symptoms of infection and to seek medical attention should they occur. Patient verbalized understanding of these instructions and education.     PATIENT EDUCATION: Education details: HEP review  Person educated: Patient Education method: Programmer, multimedia,  Education comprehension: verbalized understanding,  HOME EXERCISE PROGRAM: Access Code:  [email or web address] URL: [email or web address]   ASSESSMENT:  CLINICAL IMPRESSION: Patient tolerated session well today focusing on further manual therapy techniques aimed at reducing chest tightness and postural strengthening. TPDN was performed to Rt upper trap with excellent twitch response elicited and soreness reported post-intervention, as expected. With postural strengthening she requires frequent cues to reduce excessive upper trap engagement and maintain erect posture. Finished session with MHP to reduce upper trap soreness.   OBJECTIVE IMPAIRMENTS: decreased activity tolerance, decreased mobility, decreased ROM, decreased strength, hypomobility, increased fascial restrictions, impaired flexibility, postural dysfunction, and pain.   ACTIVITY LIMITATIONS: bending and reach over head  PARTICIPATION LIMITATIONS: community activity and occupation  PERSONAL FACTORS: Age, Fitness, Time since onset of injury/illness/exacerbation, and 1 comorbidity: see PMH above  are also affecting patient's functional outcome.   REHAB POTENTIAL: Good  CLINICAL DECISION MAKING: Stable/uncomplicated  EVALUATION COMPLEXITY: Low  GOALS: Goals reviewed with patient? Yes  SHORT TERM GOALS: Target date: 12/03/2022    Patient will be independent and compliant with initial HEP.   Baseline: issued at eval  Goal status: met  2.  Patient will demonstrate at least 70 degrees of Rt shoulder IR AROM to improve ability to  complete self-care activities.  Baseline: see above  Goal status: met  3.  Patient will demonstrate </= 10 cm with table to acromion test on the Rt to signify a reduction in tightness in chest musculature.  Baseline: see above  Goal status: met   LONG TERM GOALS: Target date: 01/02/2023    Patient will report tightness/discomfort at worst as </=4/10 to reduce her current functional limitations.  Baseline: see above  Goal status: INITIAL  2.  Patient will demonstrate 5/5 middle  trap strength to improve postural stability.  Baseline: see above  Goal status: INITIAL  3.  Patient will improve lat length by at least 10 degrees indicative of improved muscle extensibility and reduction in tightness.  Baseline: see above  Goal status: INITIAL  4.  Patient will be independent with advanced home program to assist in management of her chronic condition.  Baseline: initial HEP issued  Goal status: INITIAL   PLAN: PT FREQUENCY: 1x/week  PT DURATION: 8 weeks  PLANNED INTERVENTIONS: Therapeutic exercises, Therapeutic activity, Neuromuscular re-education, Patient/Family education, Self Care, Joint mobilization, Dry Needling, Spinal manipulation, Spinal mobilization, Cryotherapy, Moist heat, Manual therapy, and Re-evaluation  PLAN FOR NEXT SESSION: review and progress HEP prn; TPDN to pecs. manual to chest musculature, postural strengthening    Letitia Libra, PT, DPT, ATC 12/09/22 2:50 PM

## 2022-12-15 ENCOUNTER — Ambulatory Visit: Payer: BC Managed Care – PPO | Admitting: Physical Therapy

## 2022-12-15 DIAGNOSIS — M25611 Stiffness of right shoulder, not elsewhere classified: Secondary | ICD-10-CM | POA: Diagnosis not present

## 2022-12-15 DIAGNOSIS — M6281 Muscle weakness (generalized): Secondary | ICD-10-CM

## 2022-12-15 DIAGNOSIS — R293 Abnormal posture: Secondary | ICD-10-CM

## 2022-12-15 DIAGNOSIS — N644 Mastodynia: Secondary | ICD-10-CM

## 2022-12-15 NOTE — Therapy (Signed)
OUTPATIENT PHYSICAL THERAPY TREATMENT   Patient Name: Stacey Singh MRN: [id number] DOB:[birth year], 47 y.o., female Today's Date: 12/15/2022  END OF SESSION:  PT End of Session - 12/15/22 0850     Visit Number 7    Number of Visits 9    Date for PT Re-Evaluation 01/02/23    Authorization Type BC[employer]    PT Start Time [phone]    PT Stop Time 0930    PT Time Calculation (min) 43 min    Activity Tolerance Patient tolerated treatment well    Behavior During Therapy [facility] for tasks assessed/performed                  Past Medical History:  Diagnosis Date   Allergy    Cancer (HCC)    breast   Family history of breast cancer    Family history of leukemia    Female infertility    Heart murmur    in high school, resolved   History of COVID-19 08/05/2020   Hypertension    Past Surgical History:  Procedure Laterality Date   BREAST CYST EXCISION Bilateral 10/09/2020   Procedure: EXCISION OF EXCESS BREAST TISSUE;  Surgeon: Peggye Form, DO;  Location: [facility];  Service: Plastics;  Laterality: Bilateral;   BREAST RECONSTRUCTION WITH PLACEMENT OF TISSUE EXPANDER AND FLEX HD (ACELLULAR HYDRATED DERMIS) Bilateral 03/13/2020   Procedure: BILATERAL BREAST RECONSTRUCTION WITH PLACEMENT OF TISSUE EXPANDERS AND FLEX HD (ACELLULAR HYDRATED DERMIS);  Surgeon: Peggye Form, DO;  Location: [facility];  Service: Plastics;  Laterality: Bilateral;   CESAREAN SECTION  04/04/2012   Procedure: CESAREAN SECTION;  Surgeon: Freddrick March. Tenny Craw, MD;  Location: [facility];  Service: Obstetrics;  Laterality: N/A;   CESAREAN SECTION N/A 05/19/2015   Procedure: CESAREAN SECTION;  Surgeon: Philip Aspen, DO;  Location: [facility];  Service: Obstetrics;  Laterality: N/A;   CESAREAN SECTION     DILATION AND CURETTAGE OF UTERUS     LIPOSUCTION WITH LIPOFILLING Bilateral 10/09/2020   Procedure: LIPOSUCTION WITH LIPOFILLING;  Surgeon: Peggye Form, DO;  Location: [facility];  Service: Plastics;  Laterality: Bilateral;  3 hours total, please   MASTECTOMY W/ SENTINEL NODE BIOPSY Bilateral 03/13/2020   Procedure: BILATERAL MASTECTOMY WITH RIGHT SENTINEL LYMPH NODE BIOPSY;  Surgeon: Griselda Miner, MD;  Location: [facility];  Service: General;  Laterality: Bilateral;  PEC BLOCK   REMOVAL OF BILATERAL TISSUE EXPANDERS WITH PLACEMENT OF BILATERAL BREAST IMPLANTS Bilateral 06/13/2020   Procedure: REMOVAL OF BILATERAL TISSUE EXPANDERS WITH PLACEMENT OF BILATERAL BREAST IMPLANTS;  Surgeon: Peggye Form, DO;  Location: [facility];  Service: Plastics;  Laterality: Bilateral;   WISDOM TOOTH EXTRACTION     Patient Active Problem List   Diagnosis Date Noted   Nausea 07/28/2022   Polyphagia 07/28/2022   Dyspepsia 06/09/2022   Other constipation 04/30/2022   Other hyperlipidemia 02/17/2022   Vitamin D deficiency 12/24/2021   Depression 12/24/2021   Insulin resistance 10/27/2021   Eating disorder 10/27/2021   S/P breast reconstruction, bilateral 07/17/2020   Acquired absence of breast 03/22/2020   Breast cancer (HCC) 03/13/2020   Genetic testing 01/05/2020   History of ductal carcinoma in situ (DCIS) of breast 01/03/2020   Family history of breast cancer    Family history of leukemia    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   Morbid obesity (HCC) 12/29/2017   Benign essential HTN 06/13/2015  PCP: Larkin Ina  REFERRING PROVIDER: Griselda Miner, MD   REFERRING DIAG: [phone] (ICD-10-CM) - Intraductal carcinoma in situ of right breast   THERAPY DIAG:  Stiffness of right shoulder, not elsewhere classified  Breast pain, right  Abnormal posture  Muscle weakness (generalized)  Rationale for Evaluation and Treatment: Rehabilitation  ONSET DATE: 2021   SUBJECTIVE:                                                                                                                                                                                       SUBJECTIVE STATEMENT: I have discomfort in lower to my mastectomy  Hand dominance: Right  PERTINENT HISTORY: Bilateral mastectomy with reconstruction 2021 for ductal carcinoma in situ Rt breast   PAIN:  Are you having pain?  Yes: NPRS scale: 3/10 Pain location: Rt anterior chest (localized to sternal attachment of pectorals and lower ribcage) Pain description: tightness Aggravating factors: bending Relieving factors: unknown  PRECAUTIONS: None  WEIGHT BEARING RESTRICTIONS: No  FALLS:  Has patient fallen in last 6 months? 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   PLOF: Independent  PATIENT GOALS: "less discomfort"    OBJECTIVE:   DIAGNOSTIC FINDINGS:  Bilateral breast MRI 2023   IMPRESSION: 1. Postsurgical changes related to bilateral mastectomies with implant reconstruction. No MRI evidence of malignancy or any other abnormalities in the reconstructed breasts.   2.  Cholelithiasis.  PATIENT SURVEYS :  FOTO 76% function to 73% predicted  12/09/22: 81% function   COGNITION: Overall cognitive status: Within functional limits for tasks assessed     SENSATION: Not tested  POSTURE: Forward head/rounded shoulders   UPPER EXTREMITY ROM:   Active ROM Right eval Left eval 11/27/22 12/04/22 Right   Shoulder flexion WNL "pull"     Shoulder extension      Shoulder abduction WNL "pull"     Shoulder adduction      Shoulder internal rotation 60  65 80  Shoulder external rotation 85     Elbow flexion      Elbow extension      Wrist flexion      Wrist extension      Wrist ulnar deviation      Wrist radial deviation      Wrist pronation      Wrist supination      (Blank rows = not tested)  UPPER EXTREMITY MMT:  MMT  Right eval Left eval  Shoulder flexion 4+   Lat 4  Shoulder abduction 5   Shoulder horizontal adduction 4   Shoulder internal rotation 5   Shoulder external rotation 5   Middle trapezius 4 4  Lower trapezius 4- 4-  Elbow flexion    Elbow extension    Wrist flexion    Wrist extension    Wrist ulnar deviation    Wrist radial deviation    Wrist pronation    Wrist supination    Grip strength (lbs)    (Blank rows = not tested)  SHOULDER SPECIAL TESTS: Latissimus dorsi muscle length assessment: 135 degrees  Table to acromion distance: 11 cm Rt; 10 cm Lt   11/20/22: lat length: 145 degrees   12/04/22: table to acromion 10 cm Rt  JOINT MOBILITY TESTING:  Hypomobility PAIVM thoracic spine   PALPATION:  Tautness and palpable tenderness Rt pectorals, latissimus dorsi   [facility] Adult PT Treatment:                                                DATE: 12-15-22 Therapeutic Exercise: Supine star pattern with GTB  Side lying  GTB ER Manual Therapy:  DTM/trigger point release  R side only on mx,  pectorals, R UT, lat, subscapularis, rectus abdominis , STW along costochondral margin and over lipoma on R, R sternal margins/over sterno rib junction. Scar tissue massage Myofascial release of R sternal attachment mx Passive R shoulder AROM  Trigger Point Dry Needling Treatment: Pre-treatment instruction: Patient instructed on dry needling rationale, procedures, and possible side effects including pain during treatment (achy,cramping feeling), bruising, drop of blood, lightheadedness, nausea, sweating. Patient Consent Given: Yes Education handout provided: Previously provided Muscles treated: R Scar Tissue along liine ,   R, Pectoral, R UT, along costochondral arch and rectus abdomins along proximal insertion. Mx along R sternum with rib blocking Needle size and number: .30x89mm x 5  Electrical stimulation performed: No Parameters: N/A Treatment response/outcome: Twitch response elicited  and Palpable decrease in muscle tension Post-treatment instructions: Patient instructed to expect possible mild to moderate muscle soreness later today and/or tomorrow. Patient instructed in methods to reduce muscle soreness and to continue prescribed HEP. If patient was dry needled over the lung field, patient was instructed on signs and symptoms of pneumothorax and, however unlikely, to see immediate medical attention should they occur. Patient was also educated on signs and symptoms of infection and to seek medical attention should they occur. Patient verbalized understanding of these instructions and education.  [facility] Adult PT Treatment:                                                DATE: 12/09/22 Therapeutic Exercise: Thoracic extension on foam roller x 10 Cat cow x 10   Upper trap stretch x 30 sec  Lat pull down 2 x 10 @ 25 lbs  Resisted rows 1x 10 @ 25 lbs, 1 x 10 @ 35 lbs  Manual Therapy: CPAs grade II-III upper/mid T-spine STM/DTM thoracic paraspinals, rhomboids Rt scapular mobilizations upward/downward rotation, superior/inferior  J thrust cervicothoracic manipulation   Trigger Point Dry Needling Treatment: Pre-treatment instruction: Patient instructed on dry needling rationale, procedures, and possible side effects including pain during treatment (achy,cramping feeling), bruising, drop of blood, lightheadedness, nausea, sweating. Patient  Consent Given: Yes Education handout provided: Previously provided Muscles treated: rt upper trap   Needle size and number: .30x97mm x 1 Electrical stimulation performed: No Parameters: N/A Treatment response/outcome: Twitch response elicited and Palpable decrease in muscle tension Post-treatment instructions: Patient instructed to expect possible mild to moderate muscle soreness later today and/or tomorrow. Patient instructed in methods to reduce muscle soreness and to continue prescribed HEP. If patient was dry needled over the lung field,  patient was instructed on signs and symptoms of pneumothorax and, however unlikely, to see immediate medical attention should they occur. Patient was also educated on signs and symptoms of infection and to seek medical attention should they occur. Patient verbalized understanding of these instructions and education.  Modalities:  MHP Rt upper trap x 10 minutes   [facility] Adult PT Treatment:                                                DATE: 12/04/22 Therapeutic Exercise: Sidelying trunk rotation x 10 each  Standing serratus punch 2 x 10 black band Updated HEP  Manual Therapy: IASTM Rt pectorals, lats, subscapularis, anterior deltoid Trigger point release Rt pectorals  Rt pec/lat stretching  Trigger Point Dry Needling Treatment: Pre-treatment instruction: Patient instructed on dry needling rationale, procedures, and possible side effects including pain during treatment (achy,cramping feeling), bruising, drop of blood, lightheadedness, nausea, sweating. Patient Consent Given: Yes Education handout provided: Previously provided Muscles treated: Rt pectorals   Needle size and number: .25x45mm x 1 Electrical stimulation performed: No Parameters: N/A Treatment response/outcome:  unable to piston as patient was unable to relax once needle was inserted Post-treatment instructions: Patient instructed to expect possible mild to moderate muscle soreness later today and/or tomorrow. Patient instructed in methods to reduce muscle soreness and to continue prescribed HEP. If patient was dry needled over the lung field, patient was instructed on signs and symptoms of pneumothorax and, however unlikely, to see immediate medical attention should they occur. Patient was also educated on signs and symptoms of infection and to seek medical attention should they occur. Patient verbalized understanding of these instructions and education.   [facility] Adult PT Treatment:                                                DATE:  11/27/22 Therapeutic Exercise: Cat/cow x 10  Diaphragmatic breathing x 10  Childs pose x 1 minute  Sleeper stretch 2 x 30 sec  Bilateral shoulder ER green band 2 x 10  Manual Therapy: IASTM to pectorals, anterior deltoid  DTM/trigger point release pectorals, lat, subscapularis Passive Rt shoulder ROM Passive lat and pec stetching  Trigger Point Dry Needling Treatment: Pre-treatment instruction: Patient instructed on dry needling rationale, procedures, and possible side effects including pain during treatment (achy,cramping feeling), bruising, drop of blood, lightheadedness, nausea, sweating. Patient Consent Given: Yes Education handout provided: Previously provided Muscles treated: Rt pectorals and lat  Needle size and number: .30x33mm x 1 and .25x65mm x 1 Electrical stimulation performed: No Parameters: N/A Treatment response/outcome: Twitch response elicited and Palpable decrease in muscle tension Post-treatment instructions: Patient instructed to expect possible mild to moderate muscle soreness later today and/or tomorrow. Patient instructed in methods to reduce muscle soreness and  to continue prescribed HEP. If patient was dry needled over the lung field, patient was instructed on signs and symptoms of pneumothorax and, however unlikely, to see immediate medical attention should they occur. Patient was also educated on signs and symptoms of infection and to seek medical attention should they occur. Patient verbalized understanding of these instructions and education.     PATIENT EDUCATION: Education details: HEP review  Person educated: Patient Education method: Programmer, multimedia,  Education comprehension: verbalized understanding,  HOME EXERCISE PROGRAM: Access Code: [email or web address] URL: [email or web address]   ASSESSMENT:  CLINICAL IMPRESSION: Patient tolerated session well today focusing on TPDN followed by manual therapy techniques aimed at reducing chest tightness and  postural strengthening. Pt consented to TPDN was performed to R UT, pectoral, lats  and sternal muscles atachements wiith excellent twitch response. Pt commented she felt decreased tightness and had palpable decreased muscle tension post manual and TPDN.  Pt also performed exercises post manual with no pain in R chest. Pt left session with decreased pain and increased AROM as per pt report.   OBJECTIVE IMPAIRMENTS: decreased activity tolerance, decreased mobility, decreased ROM, decreased strength, hypomobility, increased fascial restrictions, impaired flexibility, postural dysfunction, and pain.   ACTIVITY LIMITATIONS: bending and reach over head  PARTICIPATION LIMITATIONS: community activity and occupation  PERSONAL FACTORS: Age, Fitness, Time since onset of injury/illness/exacerbation, and 1 comorbidity: see PMH above  are also affecting patient's functional outcome.   REHAB POTENTIAL: Good  CLINICAL DECISION MAKING: Stable/uncomplicated  EVALUATION COMPLEXITY: Low  GOALS: Goals reviewed with patient? Yes  SHORT TERM GOALS: Target date: 12/03/2022    Patient will be independent and compliant with initial HEP.   Baseline: issued at eval  Goal status: met  2.  Patient will demonstrate at least 70 degrees of Rt shoulder IR AROM to improve ability to complete self-care activities.  Baseline: see above  Goal status: met  3.  Patient will demonstrate </= 10 cm with table to acromion test on the Rt to signify a reduction in tightness in chest musculature.  Baseline: see above  Goal status: met   LONG TERM GOALS: Target date: 01/02/2023    Patient will report tightness/discomfort at worst as </=4/10 to reduce her current functional limitations.  Baseline: see above  Goal status: INITIAL  2.  Patient will demonstrate 5/5 middle trap strength to improve postural stability.  Baseline: see above  Goal status: INITIAL  3.  Patient will improve lat length by at least 10 degrees  indicative of improved muscle extensibility and reduction in tightness.  Baseline: see above  Goal status: INITIAL  4.  Patient will be independent with advanced home program to assist in management of her chronic condition.  Baseline: initial HEP issued  Goal status: INITIAL   PLAN: PT FREQUENCY: 1x/week  PT DURATION: 8 weeks  PLANNED INTERVENTIONS: Therapeutic exercises, Therapeutic activity, Neuromuscular re-education, Patient/Family education, Self Care, Joint mobilization, Dry Needling, Spinal manipulation, Spinal mobilization, Cryotherapy, Moist heat, Manual therapy, and Re-evaluation  PLAN FOR NEXT SESSION: review and progress HEP prn; TPDN to pecs. manual to chest musculature, postural strengthening    Garen Lah, PT, [facility] Certified Exercise Expert for the Aging Adult  12/15/22 10:03 AM Phone: [phone] Fax: [phone]

## 2022-12-17 NOTE — Progress Notes (Signed)
TeleHealth Visit:  This visit was completed with telemedicine (audio/video) technology. Stacey Singh has verbally consented to this TeleHealth visit. The patient is located at home, the provider is located at home. The participants in this visit include the listed provider and patient. The visit was conducted today via MyChart video.  OBESITY Stacey Singh is here to discuss her progress with her obesity treatment plan along with follow-up of her obesity related diagnoses.   Today's visit was # 26 Starting weight: 236 lbs Starting date: 08/12/21 Weight at last in office visit: 165 lbs on 11/23/22 Total weight loss: 71 lbs at last in office visit on 11/23/22. Today's reported weight (12/22/22):  169 lbs  Nutrition Plan: the Category 2 plan-80%.    Current exercise:  walk 15 minutes 3-4 times per week. Resistance bands 3 x week.  PT for chest wall tightness.  Goal: 150-160 lbs  Interim History:  She is always focusing on protein intake and is doing well with this. She is close to her goal of 160 lbs. Doing PT for chest tightness.  She is status post breast cancer and has had reconstruction with implants. 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 Vegetables and veggie intake good. Cannot tolerate raw vegetables. Has a beach trip planned for the end of July.  She is to begin Wegovy 1.7 mg every 14 days.  Appetite and cravings still well-controlled.  She has enough Wegovy to last 12 more weeks.  She no longer has insurance coverage for [employer].  Assessment/Plan:  1. Hypertension Hypertension well controlled.  Has some orthostatic symptoms recently.  Medication(s): Benicar 40 mg daily.  BP Readings from Last 3 Encounters:  11/23/22 109/74  10/05/22 110/80  09/03/22 119/82   Lab Results  Component Value Date   CREATININE 0.67 07/28/2022   CREATININE 0.66 01/28/2022   CREATININE 0.55 (L) 08/12/2021   No results found for: "GFR"  Plan: Cut  Benicar in half. Take Benicar 20 mg daily. We will continue to monitor for hypotension at the 20 mg dose.   2. Dyspepsia Symptoms  well-controlled. Medication: omeprazole 40 mg daily. Wegovy exacerbates her GI symptoms-burping, reflux.  Taking Wegovy every 14 days has not reduced her GI symptoms.  Plan: Refill omeprazole 40 mg daily.   3. Morbid Obesity: Current BMI 28 Continue Wegovy every 14 days. She would like to try to maintain weight without the [facility]. We did discuss that Zepbound out-of-pocket is to 75/month if she feels she needs it.  [name] is currently in the action stage of change. As such, her goal is to continue with weight loss efforts.  She has agreed to the Category 2 plan.  Exercise goals: as is For now she will continue her current regimen. Discussed that I would like to see her eventually do resistance training 2-3 times per week for arms, legs, abs.  Behavioral modification strategies: increasing lean protein intake, decreasing simple carbohydrates , and planning for success.  [name] has agreed to follow-up with our clinic in 4 weeks.  No orders of the defined types were placed in this encounter.   Medications Discontinued During This Encounter  Medication Reason   omeprazole (PRILOSEC) 40 MG capsule Reorder     Meds ordered this encounter  Medications   omeprazole (PRILOSEC) 40 MG capsule    Sig: Take 1 capsule (40 mg total) by mouth daily.    Dispense:  90 capsule    Refill:  0    Order Specific Question:  Supervising Provider    Answer:   Glennis Brink [[id number]]      Objective:   VITALS: Per patient if applicable, see vitals. GENERAL: Alert and in no acute distress. CARDIOPULMONARY: No increased WOB. Speaking in clear sentences.  PSYCH: Pleasant and cooperative. Speech normal rate and rhythm. Affect is appropriate. Insight and judgement are appropriate. Attention is focused, linear, and appropriate.  NEURO: Oriented as arrived to appointment  on time with no prompting.   Attestation Statements:   Reviewed by clinician on day of visit: allergies, medications, problem list, medical history, surgical history, family history, social history, and previous encounter notes.   This was prepared with the assistance of Engineer, civil (consulting).  Occasional wrong-word or sound-a-like substitutions may have occurred due to the inherent limitations of voice recognition software.

## 2022-12-21 ENCOUNTER — Telehealth (INDEPENDENT_AMBULATORY_CARE_PROVIDER_SITE_OTHER): Payer: BC Managed Care – PPO | Admitting: Family Medicine

## 2022-12-22 ENCOUNTER — Telehealth (INDEPENDENT_AMBULATORY_CARE_PROVIDER_SITE_OTHER): Payer: BC Managed Care – PPO | Admitting: Family Medicine

## 2022-12-22 ENCOUNTER — Encounter (INDEPENDENT_AMBULATORY_CARE_PROVIDER_SITE_OTHER): Payer: Self-pay | Admitting: Family Medicine

## 2022-12-22 DIAGNOSIS — Z6828 Body mass index (BMI) 28.0-28.9, adult: Secondary | ICD-10-CM | POA: Diagnosis not present

## 2022-12-22 DIAGNOSIS — I10 Essential (primary) hypertension: Secondary | ICD-10-CM | POA: Diagnosis not present

## 2022-12-22 DIAGNOSIS — R1013 Epigastric pain: Secondary | ICD-10-CM

## 2022-12-22 MED ORDER — OMEPRAZOLE 40 MG PO CPDR
40.0000 mg | DELAYED_RELEASE_CAPSULE | Freq: Every day | ORAL | 0 refills | Status: DC
Start: 2022-12-22 — End: 2023-03-04

## 2022-12-23 NOTE — Therapy (Signed)
OUTPATIENT PHYSICAL THERAPY TREATMENT   Patient Name: Stacey Singh MRN: [id number] DOB:[birth year], 47 y.o., female Today's Date: 12/24/2022  END OF SESSION:  PT End of Session - 12/24/22 0844     Visit Number 8    Number of Visits 9    Date for PT Re-Evaluation 01/02/23    Authorization Type BC[employer]    PT Start Time 0845    PT Stop Time 0925    PT Time Calculation (min) 40 min    Activity Tolerance Patient tolerated treatment well    Behavior During Therapy [facility] for tasks assessed/performed                   Past Medical History:  Diagnosis Date   Allergy    Cancer (HCC)    breast   Family history of breast cancer    Family history of leukemia    Female infertility    Heart murmur    in high school, resolved   History of COVID-19 08/05/2020   Hypertension    Past Surgical History:  Procedure Laterality Date   BREAST CYST EXCISION Bilateral 10/09/2020   Procedure: EXCISION OF EXCESS BREAST TISSUE;  Surgeon: Peggye Form, DO;  Location: [facility];  Service: Plastics;  Laterality: Bilateral;   BREAST RECONSTRUCTION WITH PLACEMENT OF TISSUE EXPANDER AND FLEX HD (ACELLULAR HYDRATED DERMIS) Bilateral 03/13/2020   Procedure: BILATERAL BREAST RECONSTRUCTION WITH PLACEMENT OF TISSUE EXPANDERS AND FLEX HD (ACELLULAR HYDRATED DERMIS);  Surgeon: Peggye Form, DO;  Location: [facility];  Service: Plastics;  Laterality: Bilateral;   CESAREAN SECTION  04/04/2012   Procedure: CESAREAN SECTION;  Surgeon: Freddrick March. Tenny Craw, MD;  Location: [facility];  Service: Obstetrics;  Laterality: N/A;   CESAREAN SECTION N/A 05/19/2015   Procedure: CESAREAN SECTION;  Surgeon: Philip Aspen, DO;  Location: [facility];  Service: Obstetrics;  Laterality: N/A;   CESAREAN SECTION     DILATION AND CURETTAGE OF UTERUS     LIPOSUCTION WITH LIPOFILLING Bilateral 10/09/2020   Procedure: LIPOSUCTION WITH LIPOFILLING;  Surgeon: Peggye Form, DO;  Location: [facility];  Service: Plastics;  Laterality: Bilateral;  3 hours total, please   MASTECTOMY W/ SENTINEL NODE BIOPSY Bilateral 03/13/2020   Procedure: BILATERAL MASTECTOMY WITH RIGHT SENTINEL LYMPH NODE BIOPSY;  Surgeon: Griselda Miner, MD;  Location: [facility];  Service: General;  Laterality: Bilateral;  PEC BLOCK   REMOVAL OF BILATERAL TISSUE EXPANDERS WITH PLACEMENT OF BILATERAL BREAST IMPLANTS Bilateral 06/13/2020   Procedure: REMOVAL OF BILATERAL TISSUE EXPANDERS WITH PLACEMENT OF BILATERAL BREAST IMPLANTS;  Surgeon: Peggye Form, DO;  Location: [facility];  Service: Plastics;  Laterality: Bilateral;   WISDOM TOOTH EXTRACTION     Patient Active Problem List   Diagnosis Date Noted   Nausea 07/28/2022   Polyphagia 07/28/2022   Dyspepsia 06/09/2022   Other constipation 04/30/2022   Other hyperlipidemia 02/17/2022   Vitamin D deficiency 12/24/2021   Depression 12/24/2021   Insulin resistance 10/27/2021   Eating disorder 10/27/2021   S/P breast reconstruction, bilateral 07/17/2020   Acquired absence of breast 03/22/2020   Breast cancer (HCC) 03/13/2020   Genetic testing 01/05/2020   History of ductal carcinoma in situ (DCIS) of breast 01/03/2020   Family history of breast cancer    Family history of leukemia    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   Morbid obesity (HCC) 12/29/2017   Benign essential HTN  06/13/2015    PCP: Larkin Ina  REFERRING PROVIDER: Griselda Miner, MD   REFERRING DIAG: [phone] (ICD-10-CM) - Intraductal carcinoma in situ of right breast   THERAPY DIAG:  Stiffness of right shoulder, not elsewhere classified  Breast pain, right  Abnormal posture  Muscle weakness (generalized)  Rationale for Evaluation and Treatment: Rehabilitation  ONSET DATE: 2021   SUBJECTIVE:                                                                                                                                                                                       SUBJECTIVE STATEMENT:  [place] was at the beach and I did feel some tightness with certain movements. When I bend over and pick up heavy items.   Hand dominance: Right  PERTINENT HISTORY: Bilateral mastectomy with reconstruction 2021 for ductal carcinoma in situ Rt breast   PAIN:  Are you having pain?  Yes: NPRS scale: 3/10 Pain location: Rt anterior chest (localized to sternal attachment of pectorals and lower ribcage) Pain description: tightness Aggravating factors: bending Relieving factors: unknown  PRECAUTIONS: None  WEIGHT BEARING RESTRICTIONS: No  FALLS:  Has patient fallen in last 6 months? 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   PLOF: Independent  PATIENT GOALS: "less discomfort"    OBJECTIVE:   DIAGNOSTIC FINDINGS:  Bilateral breast MRI 2023   IMPRESSION: 1. Postsurgical changes related to bilateral mastectomies with implant reconstruction. No MRI evidence of malignancy or any other abnormalities in the reconstructed breasts.   2.  Cholelithiasis.  PATIENT SURVEYS :  FOTO 76% function to 73% predicted  12/09/22: 81% function   COGNITION: Overall cognitive status: Within functional limits for tasks assessed     SENSATION: Not tested  POSTURE: Forward head/rounded shoulders   UPPER EXTREMITY ROM:   Active ROM Right eval Left eval 11/27/22 12/04/22 Right   Shoulder flexion WNL "pull"     Shoulder extension      Shoulder abduction WNL "pull"     Shoulder adduction      Shoulder internal rotation 60  65 80  Shoulder external rotation 85     Elbow flexion      Elbow extension      Wrist flexion      Wrist extension      Wrist ulnar deviation      Wrist radial deviation      Wrist pronation      Wrist  supination      (Blank rows =  not tested)  UPPER EXTREMITY MMT:  MMT Right eval Left eval  Shoulder flexion 4+   Lat 4    Shoulder abduction 5   Shoulder horizontal adduction 4   Shoulder internal rotation 5   Shoulder external rotation 5   Middle trapezius 4 4  Lower trapezius 4- 4-  Elbow flexion    Elbow extension    Wrist flexion    Wrist extension    Wrist ulnar deviation    Wrist radial deviation    Wrist pronation    Wrist supination    Grip strength (lbs)    (Blank rows = not tested)  SHOULDER SPECIAL TESTS: Latissimus dorsi muscle length assessment: 135 degrees  Table to acromion distance: 11 cm Rt; 10 cm Lt   11/20/22: lat length: 145 degrees   12/04/22: table to acromion 10 cm Rt  JOINT MOBILITY TESTING:  Hypomobility PAIVM thoracic spine   PALPATION:  Tautness and palpable tenderness Rt pectorals, latissimus dorsi  [facility] Adult PT Treatment:                                                DATE: 12-24-22  Manual Therapy DTM/trigger point release  R side only on mx,  pectorals,  subscapularis, Sternal attachment of pec mx.  STW along costochondral margin and over lipoma on R, R sternal margins/over sterno rib junction. Myofascial release of R sternal attachment mx   Trigger Point Dry Needling Treatment: Pre-treatment instruction: Patient instructed on dry needling rationale, procedures, and possible side effects including pain during treatment (achy,cramping feeling), bruising, drop of blood, lightheadedness, nausea, sweating. Patient Consent Given: Yes Education handout provided: Previously provided Muscles treated: R Scar Tissue along liine ,   R, Pectoral, along costochondral arch and rectus abdomins along proximal insertion. Mx along R sternum with rib blocking Needle size and number: .30x46mm x 5  Electrical stimulation performed: No Parameters: N/A Treatment response/outcome: Twitch response elicited and Palpable decrease in muscle  tension Post-treatment instructions: Patient instructed to expect possible mild to moderate muscle soreness later today and/or tomorrow. Patient instructed in methods to reduce muscle soreness and to continue prescribed HEP. If patient was dry needled over the lung field, patient was instructed on signs and symptoms of pneumothorax and, however unlikely, to see immediate medical attention should they occur. Patient was also educated on signs and symptoms of infection and to seek medical attention should they occur. Patient verbalized understanding of these instructions and education.  Self Care: Community wellness opportunities   [facility] Adult PT Treatment:                                                DATE: 12-15-22 Therapeutic Exercise: Supine star pattern with GTB  Side lying  GTB ER Manual Therapy:  DTM/trigger point release  R side only on mx,  pectorals, R UT, lat, subscapularis, rectus abdominis , STW along costochondral margin and over lipoma on R, R sternal margins/over sterno rib junction. Scar tissue massage Myofascial release of R sternal attachment mx Passive R shoulder AROM  Trigger Point Dry Needling Treatment: Pre-treatment instruction: Patient instructed on dry needling rationale, procedures, and possible side effects including pain during treatment (achy,cramping feeling), bruising, drop of blood, lightheadedness,  nausea, sweating. Patient Consent Given: Yes Education handout provided: Previously provided Muscles treated: R Scar Tissue along liine ,   R, Pectoral, R UT, along costochondral arch and rectus abdomins along proximal insertion. Mx along R sternum with rib blocking Needle size and number: .30x33mm x 5  Electrical stimulation performed: No Parameters: N/A Treatment response/outcome: Twitch response elicited and Palpable decrease in muscle tension Post-treatment instructions: Patient instructed to expect possible mild to moderate muscle soreness later today and/or  tomorrow. Patient instructed in methods to reduce muscle soreness and to continue prescribed HEP. If patient was dry needled over the lung field, patient was instructed on signs and symptoms of pneumothorax and, however unlikely, to see immediate medical attention should they occur. Patient was also educated on signs and symptoms of infection and to seek medical attention should they occur. Patient verbalized understanding of these instructions and education.  [facility] Adult PT Treatment:                                                DATE: 12/09/22 Therapeutic Exercise: Thoracic extension on foam roller x 10 Cat cow x 10   Upper trap stretch x 30 sec  Lat pull down 2 x 10 @ 25 lbs  Resisted rows 1x 10 @ 25 lbs, 1 x 10 @ 35 lbs  Manual Therapy: CPAs grade II-III upper/mid T-spine STM/DTM thoracic paraspinals, rhomboids Rt scapular mobilizations upward/downward rotation, superior/inferior  J thrust cervicothoracic manipulation   Trigger Point Dry Needling Treatment: Pre-treatment instruction: Patient instructed on dry needling rationale, procedures, and possible side effects including pain during treatment (achy,cramping feeling), bruising, drop of blood, lightheadedness, nausea, sweating. Patient Consent Given: Yes Education handout provided: Previously provided Muscles treated: rt upper trap   Needle size and number: .30x36mm x 1 Electrical stimulation performed: No Parameters: N/A Treatment response/outcome: Twitch response elicited and Palpable decrease in muscle tension Post-treatment instructions: Patient instructed to expect possible mild to moderate muscle soreness later today and/or tomorrow. Patient instructed in methods to reduce muscle soreness and to continue prescribed HEP. If patient was dry needled over the lung field, patient was instructed on signs and symptoms of pneumothorax and, however unlikely, to see immediate medical attention should they occur. Patient was also educated on  signs and symptoms of infection and to seek medical attention should they occur. Patient verbalized understanding of these instructions and education.  Modalities:  MHP Rt upper trap x 10 minutes   [facility] Adult PT Treatment:                                                DATE: 12/04/22 Therapeutic Exercise: Sidelying trunk rotation x 10 each  Standing serratus punch 2 x 10 black band Updated HEP  Manual Therapy: IASTM Rt pectorals, lats, subscapularis, anterior deltoid Trigger point release Rt pectorals  Rt pec/lat stretching  Trigger Point Dry Needling Treatment: Pre-treatment instruction: Patient instructed on dry needling rationale, procedures, and possible side effects including pain during treatment (achy,cramping feeling), bruising, drop of blood, lightheadedness, nausea, sweating. Patient Consent Given: Yes Education handout provided: Previously provided Muscles treated: Rt pectorals   Needle size and number: .25x2mm x 1 Electrical stimulation performed: No Parameters: N/A Treatment response/outcome:  unable to piston  as patient was unable to relax once needle was inserted Post-treatment instructions: Patient instructed to expect possible mild to moderate muscle soreness later today and/or tomorrow. Patient instructed in methods to reduce muscle soreness and to continue prescribed HEP. If patient was dry needled over the lung field, patient was instructed on signs and symptoms of pneumothorax and, however unlikely, to see immediate medical attention should they occur. Patient was also educated on signs and symptoms of infection and to seek medical attention should they occur. Patient verbalized understanding of these instructions and education.   [facility] Adult PT Treatment:                                                DATE: 11/27/22 Therapeutic Exercise: Cat/cow x 10  Diaphragmatic breathing x 10  Childs pose x 1 minute  Sleeper stretch 2 x 30 sec  Bilateral shoulder ER green band 2  x 10  Manual Therapy: IASTM to pectorals, anterior deltoid  DTM/trigger point release pectorals, lat, subscapularis Passive Rt shoulder ROM Passive lat and pec stetching  Trigger Point Dry Needling Treatment: Pre-treatment instruction: Patient instructed on dry needling rationale, procedures, and possible side effects including pain during treatment (achy,cramping feeling), bruising, drop of blood, lightheadedness, nausea, sweating. Patient Consent Given: Yes Education handout provided: Previously provided Muscles treated: Rt pectorals and lat  Needle size and number: .30x41mm x 1 and .25x70mm x 1 Electrical stimulation performed: No Parameters: N/A Treatment response/outcome: Twitch response elicited and Palpable decrease in muscle tension Post-treatment instructions: Patient instructed to expect possible mild to moderate muscle soreness later today and/or tomorrow. Patient instructed in methods to reduce muscle soreness and to continue prescribed HEP. If patient was dry needled over the lung field, patient was instructed on signs and symptoms of pneumothorax and, however unlikely, to see immediate medical attention should they occur. Patient was also educated on signs and symptoms of infection and to seek medical attention should they occur. Patient verbalized understanding of these instructions and education.     PATIENT EDUCATION: Education details: HEP review  Person educated: Patient Education method: Programmer, multimedia,  Education comprehension: verbalized understanding,  HOME EXERCISE PROGRAM: Access Code: [email or web address] URL: [email or web address]   ASSESSMENT:  CLINICAL IMPRESSION:   Patient tolerated session well today focusing on TPDN followed by manual therapy techniques aimed at reducing chest tightness and postural strengthening. Pt consented to TPDN was performed to pectoral,   and sternal muscles atachements wiith  twitch response. Pt commented she felt decreased  tightness and had palpable decreased muscle tension post manual and TPDN. . Pt left session with decreased pain as per pt report.  Ms Fiechtner was also informed of local community resources post DC after next visit.  OBJECTIVE IMPAIRMENTS: decreased activity tolerance, decreased mobility, decreased ROM, decreased strength, hypomobility, increased fascial restrictions, impaired flexibility, postural dysfunction, and pain.   ACTIVITY LIMITATIONS: bending and reach over head  PARTICIPATION LIMITATIONS: community activity and occupation  PERSONAL FACTORS: Age, Fitness, Time since onset of injury/illness/exacerbation, and 1 comorbidity: see PMH above  are also affecting patient's functional outcome.   REHAB POTENTIAL: Good  CLINICAL DECISION MAKING: Stable/uncomplicated  EVALUATION COMPLEXITY: Low  GOALS: Goals reviewed with patient? Yes  SHORT TERM GOALS: Target date: 12/03/2022    Patient will be independent and compliant with initial HEP.   Baseline: issued at eval  Goal status: met  2.  Patient will demonstrate at least 70 degrees of Rt shoulder IR AROM to improve ability to complete self-care activities.  Baseline: see above  Goal status: met  3.  Patient will demonstrate </= 10 cm with table to acromion test on the Rt to signify a reduction in tightness in chest musculature.  Baseline: see above  Goal status: met   LONG TERM GOALS: Target date: 01/02/2023    Patient will report tightness/discomfort at worst as </=4/10 to reduce her current functional limitations.  Baseline: see above  Goal status: INITIAL  2.  Patient will demonstrate 5/5 middle trap strength to improve postural stability.  Baseline: see above  Goal status: INITIAL  3.  Patient will improve lat length by at least 10 degrees indicative of improved muscle extensibility and reduction in tightness.  Baseline: see above  Goal status: INITIAL  4.  Patient will be independent with advanced home program to  assist in management of her chronic condition.  Baseline: initial HEP issued  Goal status: INITIAL   PLAN: PT FREQUENCY: 1x/week  PT DURATION: 8 weeks  PLANNED INTERVENTIONS: Therapeutic exercises, Therapeutic activity, Neuromuscular re-education, Patient/Family education, Self Care, Joint mobilization, Dry Needling, Spinal manipulation, Spinal mobilization, Cryotherapy, Moist heat, Manual therapy, and Re-evaluation  PLAN FOR NEXT SESSION: review and progress HEP prn; TPDN to pecs. manual to chest musculature, postural strengthening . Myofascial cupping over sternal muscles and pectorals on Right   Garen Lah, PT, [facility] Certified Exercise Expert for the Aging Adult  12/24/22 9:29 AM Phone: [phone] Fax: [phone]

## 2022-12-24 ENCOUNTER — Encounter: Payer: Self-pay | Admitting: Physical Therapy

## 2022-12-24 ENCOUNTER — Ambulatory Visit: Payer: BC Managed Care – PPO | Admitting: Physical Therapy

## 2022-12-24 DIAGNOSIS — N644 Mastodynia: Secondary | ICD-10-CM

## 2022-12-24 DIAGNOSIS — R293 Abnormal posture: Secondary | ICD-10-CM

## 2022-12-24 DIAGNOSIS — M25611 Stiffness of right shoulder, not elsewhere classified: Secondary | ICD-10-CM | POA: Diagnosis not present

## 2022-12-24 DIAGNOSIS — M6281 Muscle weakness (generalized): Secondary | ICD-10-CM

## 2022-12-31 NOTE — Therapy (Signed)
OUTPATIENT PHYSICAL THERAPY TREATMENT PHYSICAL THERAPY DISCHARGE SUMMARY  Visits from Start of Care: 9  Current functional level related to goals / functional outcomes: See goals below   Remaining deficits: Intermittent Rt chest musculature tightness    Education / Equipment: See education below    Patient agrees to discharge. Patient goals were  mostly met . Patient is being discharged due to maximized rehab potential.    Patient Name: JAQULINE CURL MRN: [id number] DOB:03/26/1976, 47 y.o., female Today's Date: 01/01/2023  END OF SESSION:  PT End of Session - 01/01/23 0929     Visit Number 9    Number of Visits 9    Date for PT Re-Evaluation 01/02/23    Authorization Type BC[employer]    PT Start Time 0930    PT Stop Time 1013    PT Time Calculation (min) 43 min    Activity Tolerance Patient tolerated treatment well    Behavior During Therapy WFL for tasks assessed/performed                    Past Medical History:  Diagnosis Date   Allergy    Cancer (HCC)    breast   Family history of breast cancer    Family history of leukemia    Female infertility    Heart murmur    in high school, resolved   History of COVID-19 08/05/2020   Hypertension    Past Surgical History:  Procedure Laterality Date   BREAST CYST EXCISION Bilateral 10/09/2020   Procedure: EXCISION OF EXCESS BREAST TISSUE;  Surgeon: Peggye Form, DO;  Location: [facility];  Service: Plastics;  Laterality: Bilateral;   BREAST RECONSTRUCTION WITH PLACEMENT OF TISSUE EXPANDER AND FLEX HD (ACELLULAR HYDRATED DERMIS) Bilateral 03/13/2020   Procedure: BILATERAL BREAST RECONSTRUCTION WITH PLACEMENT OF TISSUE EXPANDERS AND FLEX HD (ACELLULAR HYDRATED DERMIS);  Surgeon: Peggye Form, DO;  Location: [facility];  Service: Plastics;  Laterality: Bilateral;   CESAREAN SECTION  04/04/2012   Procedure: CESAREAN SECTION;  Surgeon: Freddrick March. Tenny Craw, MD;  Location: [facility];   Service: Obstetrics;  Laterality: N/A;   CESAREAN SECTION N/A 05/19/2015   Procedure: CESAREAN SECTION;  Surgeon: Philip Aspen, DO;  Location: [facility];  Service: Obstetrics;  Laterality: N/A;   CESAREAN SECTION     DILATION AND CURETTAGE OF UTERUS     LIPOSUCTION WITH LIPOFILLING Bilateral 10/09/2020   Procedure: LIPOSUCTION WITH LIPOFILLING;  Surgeon: Peggye Form, DO;  Location: [facility];  Service: Plastics;  Laterality: Bilateral;  3 hours total, please   MASTECTOMY W/ SENTINEL NODE BIOPSY Bilateral 03/13/2020   Procedure: BILATERAL MASTECTOMY WITH RIGHT SENTINEL LYMPH NODE BIOPSY;  Surgeon: Griselda Miner, MD;  Location: [facility];  Service: General;  Laterality: Bilateral;  PEC BLOCK   REMOVAL OF BILATERAL TISSUE EXPANDERS WITH PLACEMENT OF BILATERAL BREAST IMPLANTS Bilateral 06/13/2020   Procedure: REMOVAL OF BILATERAL TISSUE EXPANDERS WITH PLACEMENT OF BILATERAL BREAST IMPLANTS;  Surgeon: Peggye Form, DO;  Location:  [facility];  Service: Plastics;  Laterality: Bilateral;   WISDOM TOOTH EXTRACTION     Patient Active Problem List   Diagnosis Date Noted   Nausea 07/28/2022   Polyphagia 07/28/2022   Dyspepsia 06/09/2022   Other constipation 04/30/2022   Other hyperlipidemia 02/17/2022   Vitamin D deficiency 12/24/2021   Depression 12/24/2021   Insulin resistance 10/27/2021   Eating disorder 10/27/2021   S/P breast reconstruction, bilateral 07/17/2020  Acquired absence of breast 03/22/2020   Breast cancer (HCC) 03/13/2020   Genetic testing 01/05/2020   History of ductal carcinoma in situ (DCIS) of breast 01/03/2020   Family history of breast cancer    Family history of leukemia    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   Morbid obesity (HCC) 12/29/2017   Benign essential HTN 06/13/2015    PCP: Morrell Riddle, PA-C  REFERRING PROVIDER: Griselda Miner, MD   REFERRING DIAG: D05.11 (ICD-10-CM)  - Intraductal carcinoma in situ of right breast   THERAPY DIAG:  Stiffness of right shoulder, not elsewhere classified  Breast pain, right  Abnormal posture  Muscle weakness (generalized)  Rationale for Evaluation and Treatment: Rehabilitation  ONSET DATE: 2021   SUBJECTIVE:                                                                                                                                                                                      SUBJECTIVE STATEMENT:  Patient feels like she has a good plan in place. She feels that the TPDN has been helpful. She continues to have random pain that is occurring less often along the Rt chest. The tightness has improved about upper and medial chest. She reports most of the tightness is along the lower/lateral Rt side of the chest.   Hand dominance: Right  PERTINENT HISTORY: Bilateral mastectomy with reconstruction 2021 for ductal carcinoma in situ Rt breast   PAIN:  Are you having pain?  Yes: NPRS scale: 2/10 Pain location: Rt anterior chest (localized to lower lateral aspect)  Pain description: tightness Aggravating factors: bending Relieving factors: unknown  PRECAUTIONS: None  WEIGHT BEARING RESTRICTIONS: No  FALLS:  Has patient fallen in last 6 months? 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   PLOF: Independent  PATIENT GOALS: "less discomfort"    OBJECTIVE:   DIAGNOSTIC FINDINGS:  Bilateral breast MRI 2023   IMPRESSION: 1. Postsurgical changes related to bilateral mastectomies with implant reconstruction. No MRI evidence of malignancy or any other abnormalities in the reconstructed breasts.   2.  Cholelithiasis.  PATIENT SURVEYS :  FOTO 76% function to 73% predicted  12/09/22: 81% function   COGNITION: Overall cognitive  status: Within functional limits for tasks assessed     SENSATION: Not tested  POSTURE: Forward head/rounded shoulders   UPPER EXTREMITY ROM:   Active ROM Right eval Left eval 11/27/22 12/04/22 Right   Shoulder flexion WNL "pull"     Shoulder extension  Shoulder abduction WNL "pull"     Shoulder adduction      Shoulder internal rotation 60  65 80  Shoulder external rotation 85     Elbow flexion      Elbow extension      Wrist flexion      Wrist extension      Wrist ulnar deviation      Wrist radial deviation      Wrist pronation      Wrist supination      (Blank rows = not tested)  UPPER EXTREMITY MMT:  MMT Right eval Left eval 01/01/23  Shoulder flexion 4+    Lat 4     Shoulder abduction 5    Shoulder horizontal adduction 4    Shoulder internal rotation 5    Shoulder external rotation 5    Middle trapezius 4 4 5/5 bilateral  Lower trapezius 4- 4-   Elbow flexion     Elbow extension     Wrist flexion     Wrist extension     Wrist ulnar deviation     Wrist radial deviation     Wrist pronation     Wrist supination     Grip strength (lbs)     (Blank rows = not tested)  SHOULDER SPECIAL TESTS: Latissimus dorsi muscle length assessment: 135 degrees  Table to acromion distance: 11 cm Rt; 10 cm Lt   11/20/22: lat length: 145 degrees   12/04/22: table to acromion 10 cm Rt  01/01/23: lat length: 150 degrees   JOINT MOBILITY TESTING:  Hypomobility PAIVM thoracic spine   PALPATION:  Tautness and palpable tenderness Rt pectorals, latissimus dorsi  [facility] Adult PT Treatment:                                                DATE: 01/01/23 Therapeutic Exercise: Reviewed HEP Manual Therapy: STM/DTM/Trigger point release Rt pectorals, lat, subscap, serratus anterior, intercostals, diaphragm  Therapeutic Activity: Re-assessment of goals, educating patient on overall progress.      [facility] Adult PT Treatment:                                                DATE:  12-24-22  Manual Therapy DTM/trigger point release  R side only on mx,  pectorals,  subscapularis, Sternal attachment of pec mx.  STW along costochondral margin and over lipoma on R, R sternal margins/over sterno rib junction. Myofascial release of R sternal attachment mx   Trigger Point Dry Needling Treatment: Pre-treatment instruction: Patient instructed on dry needling rationale, procedures, and possible side effects including pain during treatment (achy,cramping feeling), bruising, drop of blood, lightheadedness, nausea, sweating. Patient Consent Given: Yes Education handout provided: Previously provided Muscles treated: R Scar Tissue along liine ,   R, Pectoral, along costochondral arch and rectus abdomins along proximal insertion. Mx along R sternum with rib blocking Needle size and number: .30x43mm x 5  Electrical stimulation performed: No Parameters: N/A Treatment response/outcome: Twitch response elicited and Palpable decrease in muscle tension Post-treatment instructions: Patient instructed to expect possible mild to moderate muscle soreness later today and/or tomorrow. Patient instructed in methods to reduce muscle soreness and to continue prescribed HEP. If patient was dry needled  over the lung field, patient was instructed on signs and symptoms of pneumothorax and, however unlikely, to see immediate medical attention should they occur. Patient was also educated on signs and symptoms of infection and to seek medical attention should they occur. Patient verbalized understanding of these instructions and education.  Self Care: Community wellness opportunities   [facility] Adult PT Treatment:                                                DATE: 12-15-22 Therapeutic Exercise: Supine star pattern with GTB  Side lying  GTB ER Manual Therapy:  DTM/trigger point release  R side only on mx,  pectorals, R UT, lat, subscapularis, rectus abdominis , STW along costochondral margin and over lipoma  on R, R sternal margins/over sterno rib junction. Scar tissue massage Myofascial release of R sternal attachment mx Passive R shoulder AROM  Trigger Point Dry Needling Treatment: Pre-treatment instruction: Patient instructed on dry needling rationale, procedures, and possible side effects including pain during treatment (achy,cramping feeling), bruising, drop of blood, lightheadedness, nausea, sweating. Patient Consent Given: Yes Education handout provided: Previously provided Muscles treated: R Scar Tissue along liine ,   R, Pectoral, R UT, along costochondral arch and rectus abdomins along proximal insertion. Mx along R sternum with rib blocking Needle size and number: .30x79mm x 5  Electrical stimulation performed: No Parameters: N/A Treatment response/outcome: Twitch response elicited and Palpable decrease in muscle tension Post-treatment instructions: Patient instructed to expect possible mild to moderate muscle soreness later today and/or tomorrow. Patient instructed in methods to reduce muscle soreness and to continue prescribed HEP. If patient was dry needled over the lung field, patient was instructed on signs and symptoms of pneumothorax and, however unlikely, to see immediate medical attention should they occur. Patient was also educated on signs and symptoms of infection and to seek medical attention should they occur. Patient verbalized understanding of these instructions and education.  [facility] Adult PT Treatment:                                                DATE: 12/09/22 Therapeutic Exercise: Thoracic extension on foam roller x 10 Cat cow x 10   Upper trap stretch x 30 sec  Lat pull down 2 x 10 @ 25 lbs  Resisted rows 1x 10 @ 25 lbs, 1 x 10 @ 35 lbs  Manual Therapy: CPAs grade II-III upper/mid T-spine STM/DTM thoracic paraspinals, rhomboids Rt scapular mobilizations upward/downward rotation, superior/inferior  J thrust cervicothoracic manipulation   Trigger Point Dry  Needling Treatment: Pre-treatment instruction: Patient instructed on dry needling rationale, procedures, and possible side effects including pain during treatment (achy,cramping feeling), bruising, drop of blood, lightheadedness, nausea, sweating. Patient Consent Given: Yes Education handout provided: Previously provided Muscles treated: rt upper trap   Needle size and number: .30x67mm x 1 Electrical stimulation performed: No Parameters: N/A Treatment response/outcome: Twitch response elicited and Palpable decrease in muscle tension Post-treatment instructions: Patient instructed to expect possible mild to moderate muscle soreness later today and/or tomorrow. Patient instructed in methods to reduce muscle soreness and to continue prescribed HEP. If patient was dry needled over the lung field, patient was instructed on signs and symptoms of pneumothorax and, however  unlikely, to see immediate medical attention should they occur. Patient was also educated on signs and symptoms of infection and to seek medical attention should they occur. Patient verbalized understanding of these instructions and education.  Modalities:  MHP Rt upper trap x 10 minutes   [facility] Adult PT Treatment:                                                DATE: 12/04/22 Therapeutic Exercise: Sidelying trunk rotation x 10 each  Standing serratus punch 2 x 10 black band Updated HEP  Manual Therapy: IASTM Rt pectorals, lats, subscapularis, anterior deltoid Trigger point release Rt pectorals  Rt pec/lat stretching  Trigger Point Dry Needling Treatment: Pre-treatment instruction: Patient instructed on dry needling rationale, procedures, and possible side effects including pain during treatment (achy,cramping feeling), bruising, drop of blood, lightheadedness, nausea, sweating. Patient Consent Given: Yes Education handout provided: Previously provided Muscles treated: Rt pectorals   Needle size and number: .25x34mm x 1 Electrical  stimulation performed: No Parameters: N/A Treatment response/outcome:  unable to piston as patient was unable to relax once needle was inserted Post-treatment instructions: Patient instructed to expect possible mild to moderate muscle soreness later today and/or tomorrow. Patient instructed in methods to reduce muscle soreness and to continue prescribed HEP. If patient was dry needled over the lung field, patient was instructed on signs and symptoms of pneumothorax and, however unlikely, to see immediate medical attention should they occur. Patient was also educated on signs and symptoms of infection and to seek medical attention should they occur. Patient verbalized understanding of these instructions and education.   [facility] Adult PT Treatment:                                                DATE: 11/27/22 Therapeutic Exercise: Cat/cow x 10  Diaphragmatic breathing x 10  Childs pose x 1 minute  Sleeper stretch 2 x 30 sec  Bilateral shoulder ER green band 2 x 10  Manual Therapy: IASTM to pectorals, anterior deltoid  DTM/trigger point release pectorals, lat, subscapularis Passive Rt shoulder ROM Passive lat and pec stetching  Trigger Point Dry Needling Treatment: Pre-treatment instruction: Patient instructed on dry needling rationale, procedures, and possible side effects including pain during treatment (achy,cramping feeling), bruising, drop of blood, lightheadedness, nausea, sweating. Patient Consent Given: Yes Education handout provided: Previously provided Muscles treated: Rt pectorals and lat  Needle size and number: .30x25mm x 1 and .25x57mm x 1 Electrical stimulation performed: No Parameters: N/A Treatment response/outcome: Twitch response elicited and Palpable decrease in muscle tension Post-treatment instructions: Patient instructed to expect possible mild to moderate muscle soreness later today and/or tomorrow. Patient instructed in methods to reduce muscle soreness and to continue  prescribed HEP. If patient was dry needled over the lung field, patient was instructed on signs and symptoms of pneumothorax and, however unlikely, to see immediate medical attention should they occur. Patient was also educated on signs and symptoms of infection and to seek medical attention should they occur. Patient verbalized understanding of these instructions and education.     PATIENT EDUCATION: Education details: see treatment; d/c education  Person educated: Patient Education method: Explanation,  Education comprehension: verbalized understanding,  HOME EXERCISE PROGRAM: Access  Code: [email or web address] URL: [email or web address]   ASSESSMENT:  CLINICAL IMPRESSION:   Shaquaya has progressed well in PT reporting an overall improvement in her Rt chest musculature tightness. She has responded well to TPDN and various manual therapy technqiues aimed at reducing musculature tightness and improving chest mobility. She demonstrates improved pectoral and lattisimus dorsi length and increased periscapular strength compared to evaluation. She has met majority of established functional goals and demonstrates independence with advanced home program. She has plans to continue with trigger point dry needling/massage therapy care upon discharge for maintenance of her chief complaint. She is therefore appropriate for discharge at this time with patient in agreement with this plan.   OBJECTIVE IMPAIRMENTS: decreased activity tolerance, decreased mobility, decreased ROM, decreased strength, hypomobility, increased fascial restrictions, impaired flexibility, postural dysfunction, and pain.   ACTIVITY LIMITATIONS: bending and reach over head  PARTICIPATION LIMITATIONS: community activity and occupation  PERSONAL FACTORS: Age, Fitness, Time since onset of injury/illness/exacerbation, and 1 comorbidity: see PMH above  are also affecting patient's functional outcome.   REHAB POTENTIAL:  Good  CLINICAL DECISION MAKING: Stable/uncomplicated  EVALUATION COMPLEXITY: Low  GOALS: Goals reviewed with patient? Yes  SHORT TERM GOALS: Target date: 12/03/2022    Patient will be independent and compliant with initial HEP.   Baseline: issued at eval  Goal status: met  2.  Patient will demonstrate at least 70 degrees of Rt shoulder IR AROM to improve ability to complete self-care activities.  Baseline: see above  Goal status: met  3.  Patient will demonstrate </= 10 cm with table to acromion test on the Rt to signify a reduction in tightness in chest musculature.  Baseline: see above  Goal status: met   LONG TERM GOALS: Target date: 01/02/2023    Patient will report tightness/discomfort at worst as </=4/10 to reduce her current functional limitations.  Baseline: see above  01/01/23: 5/10  Goal status: nearly met   2.  Patient will demonstrate 5/5 middle trap strength to improve postural stability.  Baseline: see above  Goal status: met  3.  Patient will improve lat length by at least 10 degrees indicative of improved muscle extensibility and reduction in tightness.  Baseline: see above  Goal status: met  4.  Patient will be independent with advanced home program to assist in management of her chronic condition.  Baseline: initial HEP issued  Goal status: met   PLAN: PT FREQUENCY: n/a  PT DURATION: n/a  PLANNED INTERVENTIONS: Therapeutic exercises, Therapeutic activity, Neuromuscular re-education, Patient/Family education, Self Care, Joint mobilization, Dry Needling, Spinal manipulation, Spinal mobilization, Cryotherapy, Moist heat, Manual therapy, and Re-evaluation  PLAN FOR NEXT SESSION: n/a  Letitia Libra, PT, DPT, ATC 01/01/23 11:46 AM

## 2023-01-01 ENCOUNTER — Ambulatory Visit: Payer: BC Managed Care – PPO | Attending: Physician Assistant

## 2023-01-01 DIAGNOSIS — R293 Abnormal posture: Secondary | ICD-10-CM | POA: Insufficient documentation

## 2023-01-01 DIAGNOSIS — M25611 Stiffness of right shoulder, not elsewhere classified: Secondary | ICD-10-CM | POA: Insufficient documentation

## 2023-01-01 DIAGNOSIS — N644 Mastodynia: Secondary | ICD-10-CM | POA: Diagnosis present

## 2023-01-01 DIAGNOSIS — M6281 Muscle weakness (generalized): Secondary | ICD-10-CM | POA: Diagnosis present

## 2023-01-11 ENCOUNTER — Encounter (INDEPENDENT_AMBULATORY_CARE_PROVIDER_SITE_OTHER): Payer: Self-pay | Admitting: Family Medicine

## 2023-01-11 ENCOUNTER — Ambulatory Visit (INDEPENDENT_AMBULATORY_CARE_PROVIDER_SITE_OTHER): Payer: BC Managed Care – PPO | Admitting: Family Medicine

## 2023-01-11 VITALS — BP 100/69 | HR 87 | Temp 97.6°F | Ht 64.0 in | Wt 165.0 lb

## 2023-01-11 DIAGNOSIS — R1013 Epigastric pain: Secondary | ICD-10-CM | POA: Diagnosis not present

## 2023-01-11 DIAGNOSIS — E559 Vitamin D deficiency, unspecified: Secondary | ICD-10-CM | POA: Diagnosis not present

## 2023-01-11 DIAGNOSIS — F3289 Other specified depressive episodes: Secondary | ICD-10-CM | POA: Diagnosis not present

## 2023-01-11 DIAGNOSIS — Z6828 Body mass index (BMI) 28.0-28.9, adult: Secondary | ICD-10-CM

## 2023-01-11 DIAGNOSIS — I10 Essential (primary) hypertension: Secondary | ICD-10-CM | POA: Diagnosis not present

## 2023-01-11 MED ORDER — BUPROPION HCL ER (SR) 150 MG PO TB12: 150.0000 mg | ORAL_TABLET | Freq: Every day | ORAL | 0 refills | Status: DC

## 2023-01-11 MED ORDER — OLMESARTAN MEDOXOMIL 5 MG PO TABS
5.0000 mg | ORAL_TABLET | Freq: Every day | ORAL | 0 refills | Status: DC
Start: 2023-01-11 — End: 2023-02-08

## 2023-01-11 NOTE — Assessment & Plan Note (Signed)
Last vitamin D Lab Results  Component Value Date   VD25OH 54.4 01/28/2022   Currently on OTC vitamin D supplement at 2000 IU once daily.  Repeat vitamin D level today

## 2023-01-11 NOTE — Assessment & Plan Note (Signed)
Worsened on Wegovy 1.7 mg once every 14-day injection.  Doing well on omeprazole 40 mg capsule nightly.  This has improved her sour burps and heartburn symptoms.  She also avoids late night eating, large portions at mealtime and high acid foods.  Continue omeprazole 40 mg capsule nightly along with lifestyle changes as recommended above.

## 2023-01-11 NOTE — Progress Notes (Signed)
Office: [phone]  /  Fax: [phone]  WEIGHT SUMMARY AND BIOMETRICS  No data recorded Starting Weight: 236lb   Weight Lost Since Last Visit: 0lb   Vitals Temp: 97.6 F (36.4 C) BP: 100/69 Pulse Rate: 87 SpO2: 99 %   Body Composition  Body Fat %: 36.8 % Fat Mass (lbs): 61 lbs Muscle Mass (lbs): 99.2 lbs Total Body Water (lbs): 73.4 lbs Visceral Fat Rating : 7     HPI  Chief Complaint: OBESITY  Stacey Singh is here to discuss her progress with her obesity treatment plan. She is on the the Category 2 Plan and states she is following her eating plan approximately 85 % of the time. She states she is exercising 30 minutes 3 times per week.   Interval History:  Since last office visit she is  down 0 lb She has a net weight loss of 71 pounds in the past 1.5 years of medically supervised weight management This is a 30% total body weight loss She is walking 30 min 3 x a week  and she is sitting at work She is on Wegovy 1.7 mg every other week  Sour burps have improved some on omeprrazole 40 mg She is slightly hungrier the week that her Reginal Lutes has worn off   Pharmacotherapy: Wegovy 1.7 mg q. 14 days  PHYSICAL EXAM:  Blood pressure 100/69, pulse 87, temperature 97.6 F (36.4 C), height 5\' 4"  (1.626 m), weight 165 lb (74.8 kg), SpO2 99 %. Body mass index is 28.32 kg/m.  General: She is overweight, cooperative, alert, well developed, and in no acute distress. PSYCH: Has normal mood, affect and thought process.   Lungs: Normal breathing effort, no conversational dyspnea.   ASSESSMENT AND PLAN  TREATMENT PLAN FOR OBESITY:  Recommended Dietary Goals  Stacey Singh is currently in the action stage of change. As such, her goal is to continue weight management plan. She has agreed to the Category 2 Plan.  Behavioral Intervention  We discussed the following Behavioral Modification Strategies today: increasing lean protein intake, decreasing simple carbohydrates , increasing  vegetables, increasing lower glycemic fruits, increasing water intake, work on meal planning and preparation, continue to practice mindfulness when eating, planning for success, and better snacking choices.  Additional resources provided today: NA  Recommended Physical Activity Goals  Stacey Singh has been advised to work up to 150 minutes of moderate intensity aerobic activity a week and strengthening exercises 2-3 times per week for cardiovascular health, weight loss maintenance and preservation of muscle mass.   She has agreed to [employer] strengthening exercises with a goal of 2-3 sessions a week   Pharmacotherapy changes for the treatment of obesity: No changes  ASSOCIATED CONDITIONS ADDRESSED TODAY  Benign essential HTN Assessment & Plan: Her dose of Benicar was reduced from 40 mg to 20 mg at her last visit with Stacey Salvage, NP.  She has been monitoring her blood pressure readings at home and continues to run in the 110s over 70s.  She has felt more presyncopal with going from a sitting to standing position.  Her blood pressure today is slightly low at 100/69.  She is hydrating well with water.  She has lost 71 pounds in the past year and a half of medically supervised weight management which has likely improved her hypertension.  Reduce Benicar down to 5 mg once daily, new prescription sent in today.  Recommend monitoring home blood pressure readings and send in via MyChart.  Orders: -     Olmesartan Medoxomil;  Take 1 tablet (5 mg total) by mouth daily.  Dispense: 30 tablet; Refill: 0 -     Comprehensive metabolic panel  Vitamin D deficiency Assessment & Plan: Last vitamin D Lab Results  Component Value Date   VD25OH 54.4 01/28/2022   Currently on OTC vitamin D supplement at 2000 IU once daily.  Repeat vitamin D level today  Orders: -     VITAMIN D 25 Hydroxy (Vit-D Deficiency, Fractures)  Other depression with emotional eating Assessment & Plan: Improving on bupropion SR 150 mg  daily.  She has been less consistent with taking her bupropion and has noticed an increase in emotional eating without it.  She denies adverse side effects while on bupropion and has helped with her mood overall.  Refilled bupropion SR 150 mg once daily and reminded patient to set an alarm to remember to take it every day.  Orders: -     buPROPion HCl ER (SR); Take 1 tablet (150 mg total) by mouth daily.  Dispense: 30 tablet; Refill: 0  Morbid obesity (HCC) with starting BMI 40 Assessment & Plan: Reviewed patient's overall progress.  She has done excellent with a 30% total body weight loss and 1.5 years of medically supervised weight management.  She has been able to maintain her BMI under 30.  She feels good about her overall progress and has a good handle on better nutrition and regular physical activity but has room for improvement add in resistance training and continue to walk on a regular basis.  Will plan to repeat her fasting IC test this summer as we prepare for maintenance phase. Continue to practice mindful eating, meal planning and portion control.  Focus on lean protein and fiber with meals   Dyspepsia Assessment & Plan: Worsened on Wegovy 1.7 mg once every 14-day injection.  Doing well on omeprazole 40 mg capsule nightly.  This has improved her sour burps and heartburn symptoms.  She also avoids late night eating, large portions at mealtime and high acid foods.  Continue omeprazole 40 mg capsule nightly along with lifestyle changes as recommended above.       She was informed of the importance of frequent follow up visits to maximize her success with intensive lifestyle modifications for her multiple health conditions.   ATTESTASTION STATEMENTS:  Reviewed by clinician on day of visit: allergies, medications, problem list, medical history, surgical history, family history, social history, and previous encounter notes pertinent to obesity diagnosis.   I have personally  spent 30 minutes total time today in preparation, patient care, nutritional counseling and documentation for this visit, including the following: review of clinical lab tests; review of medical tests/procedures/services.      Glennis Brink, DO DABFM, DABOM [facility] [address] [place], [place] [address] [phone]

## 2023-01-11 NOTE — Assessment & Plan Note (Signed)
Improving on bupropion SR 150 mg daily.  She has been less consistent with taking her bupropion and has noticed an increase in emotional eating without it.  She denies adverse side effects while on bupropion and has helped with her mood overall.  Refilled bupropion SR 150 mg once daily and reminded patient to set an alarm to remember to take it every day.

## 2023-01-11 NOTE — Assessment & Plan Note (Signed)
Her dose of Benicar was reduced from 40 mg to 20 mg at her last visit with Adah Salvage, NP.  She has been monitoring her blood pressure readings at home and continues to run in the 110s over 70s.  She has felt more presyncopal with going from a sitting to standing position.  Her blood pressure today is slightly low at 100/69.  She is hydrating well with water.  She has lost 71 pounds in the past year and a half of medically supervised weight management which has likely improved her hypertension.  Reduce Benicar down to 5 mg once daily, new prescription sent in today.  Recommend monitoring home blood pressure readings and send in via MyChart.

## 2023-01-11 NOTE — Assessment & Plan Note (Signed)
Reviewed patient's overall progress.  She has done excellent with a 30% total body weight loss and 1.5 years of medically supervised weight management.  She has been able to maintain her BMI under 30.  She feels good about her overall progress and has a good handle on better nutrition and regular physical activity but has room for improvement add in resistance training and continue to walk on a regular basis.  Will plan to repeat her fasting IC test this summer as we prepare for maintenance phase. Continue to practice mindful eating, meal planning and portion control.  Focus on lean protein and fiber with meals

## 2023-01-12 LAB — COMPREHENSIVE METABOLIC PANEL
ALT: 10 IU/L (ref 0–32)
AST: 13 IU/L (ref 0–40)
Albumin: 3.9 g/dL (ref 3.9–4.9)
Alkaline Phosphatase: 44 IU/L (ref 44–121)
BUN/Creatinine Ratio: 18 (ref 9–23)
BUN: 10 mg/dL (ref 6–24)
Bilirubin Total: 0.3 mg/dL (ref 0.0–1.2)
CO2: 22 mmol/L (ref 20–29)
Calcium: 8.7 mg/dL (ref 8.7–10.2)
Chloride: 105 mmol/L (ref 96–106)
Creatinine, Ser: 0.57 mg/dL (ref 0.57–1.00)
Globulin, Total: 2.1 g/dL (ref 1.5–4.5)
Glucose: 84 mg/dL (ref 70–99)
Potassium: 4.6 mmol/L (ref 3.5–5.2)
Sodium: 138 mmol/L (ref 134–144)
Total Protein: 6 g/dL (ref 6.0–8.5)
eGFR: 113 mL/min/{1.73_m2} (ref >59)

## 2023-01-12 LAB — VITAMIN D 25 HYDROXY (VIT D DEFICIENCY, FRACTURES): Vit D, 25-Hydroxy: 66.1 ng/mL (ref 30.0–100.0)

## 2023-02-04 NOTE — Progress Notes (Signed)
TeleHealth Visit:  This visit was completed with telemedicine (audio/video) technology. Stacey Singh has verbally consented to this TeleHealth visit. The patient is located at home, the provider is located at home. The participants in this visit include the listed provider and patient. The visit was conducted today via MyChart video.  OBESITY Stacey Singh is here to discuss her progress with her obesity treatment plan along with follow-up of her obesity related diagnoses.   Today's visit was # 28 Starting weight: 236 lbs Starting date: 08/12/21 Weight at last in office visit: 165 lbs on 01/11/23 Total weight loss: 71 lbs at last in office visit on 01/11/23. Today's reported weight (02/08/23):  166 lbs  Nutrition Plan: the Category 2 plan - 70% adherence.  Current exercise:  walking 30 min several times per week.  Interim History:  She is maintaining her weight well.  Ideally she would like to lose to 160 but is happy with maintaining currently. Meals have been more off plan recently. She has been traveling a lot for her kids' sports and options are limited. Sometimes skips the meal if it is late at night. She does well on plan breakfast and lunch. She is walking while at sports practices and games for 30 minutes several days per week.  Has not used her hand weights much  Pharmacotherapy: Stacey Singh is on Wegovy 1.7 SQ weekly-taking every 14 days.  Has about 12 weeks worth left. Hunger is well controlled.  Cravings are well controlled.  Assessment/Plan:  We discussed recent lab results in depth.  1. Vitamin D Deficiency Vitamin D is at goal of 50.  Most recent vitamin D level was 66 on 01/11/2023.Marland Kitchen She was recently taken off of prescription vitamin D.  Has not started OTC.  Has several prescription vitamin D caplets left. Lab Results  Component Value Date   VD25OH 66.1 01/11/2023   VD25OH 54.4 01/28/2022   VD25OH 36.0 08/12/2021    Plan: Start  prescription ergocalciferol 50,000 IU every  14 days   2.  Dyspepsia Sometimes has GI upset, sour burps, GERD symptoms (when eating late at night).  Related to taking Wegovy. Medication: Omeprazole 40 mg daily.  Has been taking sporadically. She would like to wean off of the Prilosec.  Plan: May continue taking omeprazole 40 mg as needed. Discussed if she would like to discontinue the omeprazole she can take OTC famotidine per package directions.   3. Hypertension Hypertension well controlled.  Medication(s): Olmesartan 5 mg daily.  Dose reduced from 10 mg recently. Hypotensive symptoms have stopped. Blood pressures recently have been 118-120/80.  BP Readings from Last 3 Encounters:  01/11/23 100/69  11/23/22 109/74  10/05/22 110/80   Lab Results  Component Value Date   CREATININE 0.57 01/11/2023   CREATININE 0.67 07/28/2022   CREATININE 0.66 01/28/2022   No results found for: "GFR"  Plan: Refill olmesartan 5 mg daily.  4.  Hair loss She has noticed her hair is thinning.  She also noted some new hair growth.  She wonders if there is a supplement that can help with this.  Plan: Advised this is most likely due to weight loss and it should stabilize. Advised that biotin has not shown any real efficacy for reducing hair loss. She will maximize protein intake.  5. Morbid Obesity: Current BMI 28  Pharmacotherapy Plan Continue  Wegovy 1.7 SQ weekly every 14 days.  Stacey Singh is currently in the action stage of change. As such, her goal is to continue with weight loss efforts.  She has agreed to the Category 2 plan.  1.  She will have a protein bar or shake if she skips dinner.  She will pack protein bars to take with her.  Exercise goals: Continue walking for exercise.  Become more consistent with using the hand weights at least twice weekly.  Behavioral modification strategies: increasing lean protein intake and meal planning .  Stacey Singh has agreed to follow-up with our clinic in 4 weeks.  No orders of the defined  types were placed in this encounter.   Medications Discontinued During This Encounter  Medication Reason   Vitamin D, Ergocalciferol, (DRISDOL) 1.25 MG (50000 UNIT) CAPS capsule Completed Course   olmesartan (BENICAR) 5 MG tablet Reorder     Meds ordered this encounter  Medications   olmesartan (BENICAR) 5 MG tablet    Sig: Take 1 tablet (5 mg total) by mouth daily.    Dispense:  90 tablet    Refill:  0    Order Specific Question:   Supervising Provider    Answer:   Glennis Brink [[id number]]      Objective:   VITALS: Per patient if applicable, see vitals. GENERAL: Alert and in no acute distress. CARDIOPULMONARY: No increased WOB. Speaking in clear sentences.  PSYCH: Pleasant and cooperative. Speech normal rate and rhythm. Affect is appropriate. Insight and judgement are appropriate. Attention is focused, linear, and appropriate.  NEURO: Oriented as arrived to appointment on time with no prompting.   Attestation Statements:   Reviewed by clinician on day of visit: allergies, medications, problem list, medical history, surgical history, family history, social history, and previous encounter notes.   This was prepared with the assistance of Engineer, civil (consulting).  Occasional wrong-word or sound-a-like substitutions may have occurred due to the inherent limitations of voice recognition software.

## 2023-02-05 ENCOUNTER — Other Ambulatory Visit (INDEPENDENT_AMBULATORY_CARE_PROVIDER_SITE_OTHER): Payer: Self-pay | Admitting: Family Medicine

## 2023-02-05 DIAGNOSIS — I10 Essential (primary) hypertension: Secondary | ICD-10-CM

## 2023-02-08 ENCOUNTER — Encounter (INDEPENDENT_AMBULATORY_CARE_PROVIDER_SITE_OTHER): Payer: Self-pay | Admitting: Family Medicine

## 2023-02-08 ENCOUNTER — Telehealth (INDEPENDENT_AMBULATORY_CARE_PROVIDER_SITE_OTHER): Payer: BC Managed Care – PPO | Admitting: Family Medicine

## 2023-02-08 DIAGNOSIS — R1013 Epigastric pain: Secondary | ICD-10-CM

## 2023-02-08 DIAGNOSIS — Z6828 Body mass index (BMI) 28.0-28.9, adult: Secondary | ICD-10-CM

## 2023-02-08 DIAGNOSIS — E559 Vitamin D deficiency, unspecified: Secondary | ICD-10-CM

## 2023-02-08 DIAGNOSIS — L659 Nonscarring hair loss, unspecified: Secondary | ICD-10-CM | POA: Diagnosis not present

## 2023-02-08 DIAGNOSIS — I10 Essential (primary) hypertension: Secondary | ICD-10-CM

## 2023-02-08 MED ORDER — OLMESARTAN MEDOXOMIL 5 MG PO TABS
5.0000 mg | ORAL_TABLET | Freq: Every day | ORAL | 0 refills | Status: DC
Start: 2023-02-08 — End: 2023-04-01

## 2023-03-04 ENCOUNTER — Encounter (INDEPENDENT_AMBULATORY_CARE_PROVIDER_SITE_OTHER): Payer: Self-pay | Admitting: Family Medicine

## 2023-03-04 ENCOUNTER — Ambulatory Visit (INDEPENDENT_AMBULATORY_CARE_PROVIDER_SITE_OTHER): Payer: BC Managed Care – PPO | Admitting: Family Medicine

## 2023-03-04 VITALS — BP 125/86 | HR 75 | Temp 98.1°F | Ht 64.0 in | Wt 158.0 lb

## 2023-03-04 DIAGNOSIS — F3289 Other specified depressive episodes: Secondary | ICD-10-CM | POA: Diagnosis not present

## 2023-03-04 DIAGNOSIS — I10 Essential (primary) hypertension: Secondary | ICD-10-CM | POA: Diagnosis not present

## 2023-03-04 DIAGNOSIS — R632 Polyphagia: Secondary | ICD-10-CM

## 2023-03-04 DIAGNOSIS — Z6827 Body mass index (BMI) 27.0-27.9, adult: Secondary | ICD-10-CM

## 2023-03-04 NOTE — Progress Notes (Signed)
Office: [phone]  /  Fax: [phone]  WEIGHT SUMMARY AND BIOMETRICS  No data recorded Starting Weight: 236lb   Weight Lost Since Last Visit: 7lb   Vitals Temp: 98.1 F (36.7 C) BP: 125/86 Pulse Rate: 75 SpO2: 99 %   Body Composition  Body Fat %: 34.6 % Fat Mass (lbs): 54.8 lbs Muscle Mass (lbs): 98.6 lbs Total Body Water (lbs): 66.8 lbs Visceral Fat Rating : 7   HPI  Chief Complaint: OBESITY  Stacey Singh is here to discuss her progress with her obesity treatment plan. She is on the the Category 2 Plan and states she is following her eating plan approximately 75-80 % of the time. She states she is exercising 15-20 minutes 3-5 times per week.   Interval History:  Since last office visit she is down 7 lb  She is taking Wegovy 1.7 mg q 14 days-- using up some that she had (it is no longer covered) She denies nausea and has some heartburn She feels good at her weight, within 5 lb of her goal weight. She has been doing some weight training and is consistent with walking She plans to continue dry needing and home PT for continued R pectoral tightness post breast reconstructive surgery. Her net weight loss is 78 lb in the past 1.5 years.  This is a 33% TBW loss  Pharmacotherapy: Wegovy 1.7 mg q 14 days Has not been taking her Wellbutrin   PHYSICAL EXAM:  Blood pressure 125/86, pulse 75, temperature 98.1 F (36.7 C), height 5\' 4"  (1.626 m), weight 158 lb (71.7 kg), SpO2 99%. Body mass index is 27.12 kg/m.  General: She is overweight, cooperative, alert, well developed, and in no acute distress. PSYCH: Has normal mood, affect and thought process.   Lungs: Normal breathing effort, no conversational dyspnea.   ASSESSMENT AND PLAN  TREATMENT PLAN FOR OBESITY:  Recommended Dietary Goals  Carene is currently in the action stage of change. As such, her goal is to continue weight management plan. She has agreed to the Category 2 Plan.  Behavioral Intervention  We  discussed the following Behavioral Modification Strategies today: increasing lean protein intake, decreasing simple carbohydrates , increasing vegetables, increasing lower glycemic fruits, increasing fiber rich foods, avoiding skipping meals, increasing water intake, keeping healthy foods at home, continue to practice mindfulness when eating, and planning for success.  Additional resources provided today: NA  Recommended Physical Activity Goals  Harika has been advised to work up to 150 minutes of moderate intensity aerobic activity a week and strengthening exercises 2-3 times per week for cardiovascular health, weight loss maintenance and preservation of muscle mass.   She has agreed to Work on scheduling and tracking physical activity.   Pharmacotherapy changes for the treatment of obesity: none  ASSOCIATED CONDITIONS ADDRESSED TODAY  Polyphagia Assessment & Plan: Has improved on Wegovy 1.7 mg q14 day injection Denies adverse SE Using up the pens that she has left (her insurance no longer covers it) Denies meal skipping and has focused on lean protein intake with meals and snacks  Increase water intake to 90 oz/ day Finish out [employer] 1.7 mg q 14 days   Morbid obesity (HCC) with starting BMI 40  BMI 27.0-27.9,adult  Benign essential HTN Assessment & Plan: She is doing well on a reduced dose of olmesartan 5 mg daily.  She has some postural dizziness when getting up in the morning.  Denies CP/ DOE.  She has lost a significant amount of weight in the past  year and half of medically supervised weight management which has helped with HTN control  Continue current dose of olmesartan Hydrate well with water Arise slowly out of bed in the morning   Other depression Assessment & Plan: She had not been taking Wellbutrin daily and has noticed some episodes of emotional eating where she gets off track.  She has a good support system and is sleeping well.    She agrees to restarting  Wellbutrin SR daily which will plan to be continued once she is off of Wegovy. Continue to work on positive self talk, stress reduction, good sleep at night and keeping junk food triggers out of sight       She was informed of the importance of frequent follow up visits to maximize her success with intensive lifestyle modifications for her multiple health conditions.   ATTESTASTION STATEMENTS:  Reviewed by clinician on day of visit: allergies, medications, problem list, medical history, surgical history, family history, social history, and previous encounter notes pertinent to obesity diagnosis.   I have personally spent 30 minutes total time today in preparation, patient care, nutritional counseling and documentation for this visit, including the following: review of clinical lab tests; review of medical tests/procedures/services.      Stacey Brink, DO Stacey Singh, Stacey Singh [facility] [address] [place], [place] [address] [phone]

## 2023-03-04 NOTE — Assessment & Plan Note (Signed)
Has improved on Wegovy 1.7 mg q14 day injection Denies adverse SE Using up the pens that she has left (her insurance no longer covers it) Denies meal skipping and has focused on lean protein intake with meals and snacks  Increase water intake to 90 oz/ day Finish out Wegovy 1.7 mg q 14 days

## 2023-03-04 NOTE — Assessment & Plan Note (Signed)
She had not been taking Wellbutrin daily and has noticed some episodes of emotional eating where she gets off track.  She has a good support system and is sleeping well.    She agrees to restarting Wellbutrin SR daily which will plan to be continued once she is off of Wegovy. Continue to work on positive self talk, stress reduction, good sleep at night and keeping junk food triggers out of sight

## 2023-03-04 NOTE — Assessment & Plan Note (Signed)
She is doing well on a reduced dose of olmesartan 5 mg daily.  She has some postural dizziness when getting up in the morning.  Denies CP/ DOE.  She has lost a significant amount of weight in the past year and half of medically supervised weight management which has helped with HTN control  Continue current dose of olmesartan Hydrate well with water Arise slowly out of bed in the morning

## 2023-03-31 ENCOUNTER — Encounter (INDEPENDENT_AMBULATORY_CARE_PROVIDER_SITE_OTHER): Payer: Self-pay | Admitting: Family Medicine

## 2023-04-01 ENCOUNTER — Telehealth (INDEPENDENT_AMBULATORY_CARE_PROVIDER_SITE_OTHER): Payer: BC Managed Care – PPO | Admitting: Family Medicine

## 2023-04-01 ENCOUNTER — Encounter (INDEPENDENT_AMBULATORY_CARE_PROVIDER_SITE_OTHER): Payer: Self-pay | Admitting: Family Medicine

## 2023-04-01 DIAGNOSIS — I10 Essential (primary) hypertension: Secondary | ICD-10-CM

## 2023-04-01 DIAGNOSIS — Z6827 Body mass index (BMI) 27.0-27.9, adult: Secondary | ICD-10-CM

## 2023-04-01 DIAGNOSIS — Z87891 Personal history of nicotine dependence: Secondary | ICD-10-CM

## 2023-04-01 NOTE — Progress Notes (Signed)
  TeleHealth Visit:  This visit was completed with telemedicine (audio/video) technology. Stacey Singh has verbally consented to this TeleHealth visit. The patient is located at home, the provider is located at home. The participants in this visit include the listed provider and patient. The visit was conducted today via MyChart video.  OBESITY Stacey Singh is here to discuss her progress with her obesity treatment plan along with follow-up of her obesity related diagnoses.   Today's visit was # 30 Starting weight: 236 lbs Starting date: 08/12/21 Weight at last in office visit: 158 lbs on 03/04/23 Total weight loss: 78 lbs at last in office visit on 03/04/23. Today's reported weight (04/01/23): none reported  Nutrition Plan: the Category 2 plan   Current exercise: walking 30 minutes 3 times per week   Interim History:  She is taking Wegovy every other week and has 4 pens left. She is concerned about how well she will do without the [facility]. She is nearly at her weight goal of 150 lbs. however, she she says she would also be happy at her current weight.  She is starting to think along the lines of what she will be doing in weight maintenance phase to maintain her weight.  Typical day of food: Breakfast: protein bar Lunch: [place] sandwich, fruit Dinner: meat and vegetable Snacks: protein bar  Water intake is  good.   Pharmacotherapy: Hye is on Wegovy 1.7 SQ weekly-taking every 14 days. Adverse side effects: None Hunger is moderately controlled.  Cravings are moderately controlled.  Assessment/Plan:  1. Hypertension Hypertension well controlled Medication(s): BP Medication stopped by patient-olmesartan. Home BPs-113/81, 116/80  BP Readings from Last 3 Encounters:  03/04/23 125/86  01/11/23 100/69  11/23/22 109/74   Lab Results  Component Value Date   CREATININE 0.57 01/11/2023   CREATININE 0.67 07/28/2022   CREATININE 0.66 01/28/2022   No results found for:  "GFR"  Plan: Discontinue olmesartan.  2. Morbid Obesity: Current BMI 27  Pharmacotherapy Plan Continue Wegovy 1.7 mg every 14 days.  [name] is currently in the action stage of change. As such, her goal is to continue with weight loss efforts.  She has agreed to the Category 2 plan.  Exercise goals: Continue walking.  Discussed the importance of adding in resistance training for weight maintenance.  Behavioral modification strategies: planning for success.  [name] has agreed to follow-up with our clinic in 4 weeks.  No orders of the defined types were placed in this encounter.   Medications Discontinued During This Encounter  Medication Reason   olmesartan (BENICAR) 5 MG tablet Discontinued by provider     No orders of the defined types were placed in this encounter.     Objective:   VITALS: Per patient if applicable, see vitals. GENERAL: Alert and in no acute distress. CARDIOPULMONARY: No increased WOB. Speaking in clear sentences.  PSYCH: Pleasant and cooperative. Speech normal rate and rhythm. Affect is appropriate. Insight and judgement are appropriate. Attention is focused, linear, and appropriate.  NEURO: Oriented as arrived to appointment on time with no prompting.   Attestation Statements:   Reviewed by clinician on day of visit: allergies, medications, problem list, medical history, surgical history, family history, social history, and previous encounter notes.  This was prepared with the assistance of Engineer, civil (consulting).  Occasional wrong-word or sound-a-like substitutions may have occurred due to the inherent limitations of voice recognition software.

## 2023-04-28 ENCOUNTER — Encounter (INDEPENDENT_AMBULATORY_CARE_PROVIDER_SITE_OTHER): Payer: Self-pay | Admitting: Adult Health

## 2023-04-28 ENCOUNTER — Ambulatory Visit (INDEPENDENT_AMBULATORY_CARE_PROVIDER_SITE_OTHER): Payer: BC Managed Care – PPO | Admitting: Adult Health

## 2023-04-28 VITALS — BP 122/84 | HR 70 | Temp 97.7°F | Ht 64.0 in | Wt 156.0 lb

## 2023-04-28 DIAGNOSIS — I10 Essential (primary) hypertension: Secondary | ICD-10-CM

## 2023-04-28 DIAGNOSIS — E669 Obesity, unspecified: Secondary | ICD-10-CM

## 2023-04-28 DIAGNOSIS — Z6828 Body mass index (BMI) 28.0-28.9, adult: Secondary | ICD-10-CM

## 2023-04-28 DIAGNOSIS — R1013 Epigastric pain: Secondary | ICD-10-CM | POA: Diagnosis not present

## 2023-04-28 NOTE — Progress Notes (Signed)
WEIGHT SUMMARY AND BIOMETRICS  Vitals Temp: 97.7 F (36.5 C) BP: 122/84 Pulse Rate: 70 SpO2: 100 %   Anthropometric Measurements Height: 5\' 4"  (1.626 m) Weight: 156 lb (70.8 kg) BMI (Calculated): 26.76 Weight at Last Visit: 158lb Weight Lost Since Last Visit: 2lb Weight Gained Since Last Visit: 0 Starting Weight: 236lb Total Weight Loss (lbs): 80 lb (36.3 kg)   Body Composition  Body Fat %: 36.2 % Fat Mass (lbs): 56.6 lbs Muscle Mass (lbs): 94.6 lbs Total Body Water (lbs): 68.2 lbs Visceral Fat Rating : 7   Other Clinical Data Fasting: no Labs: no Today's Visit #: 31 Starting Date: 08/12/21    Chief Complaint:   OBESITY Stacey Singh is here to discuss her progress with her obesity treatment plan. She is on the the Category 2 Plan and states she is following her eating plan approximately 80 % of the time. She states she is exercising Walking 30 minutes 3 times per week.   Interim History:  03/26/2022 Saxenda 1.8mg  was replaced Wegovy 1.7 She as remained on this strength for 14 months. She has spaced out injections Q 14 days Denies mass in neck, dysphagia, dyspepsia, persistent hoarseness, abdominal pain, or N/V/C  She reports stable appetite levels.  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  Subjective:   1. Benign essential HTN BP at goal She stopped Olmesartan therapy > 2 months ago. She denies CP with exertion.  2. Dyspepsia She previously experienced worsening bloating and belching. She currently denies any GI upset at present. She has been on Wegovy 1.7mg  once weekly injection since 03/26/2022  Assessment/Plan:   1. Benign essential HTN Continue to monitor BP closely  2.Dyspepsia Avoid known food triggers  3. Obesity,current BMI 26.76 Continue Wegovy 1.7 mg injection every 14 days  Stacey Singh is currently in the action stage of change. As  such, her goal is to maintain weight for now. She has agreed to the Category 2 Plan.   Exercise goals: For substantial health benefits, adults should do at least 150 minutes (2 hours and 30 minutes) a week of moderate-intensity, or 75 minutes (1 hour and 15 minutes) a week of vigorous-intensity aerobic physical activity, or an equivalent combination of moderate- and vigorous-intensity aerobic activity. Aerobic activity should be performed in episodes of at least 10 minutes, and preferably, it should be spread throughout the week.  Behavioral modification strategies: increasing lean protein intake, decreasing simple carbohydrates, increasing vegetables, increasing water intake, meal planning and cooking strategies, ways to avoid boredom eating, and planning for success.  Stacey Singh has agreed to follow-up with our clinic in 4 weeks. She was informed of the importance of frequent follow-up visits to maximize her success with intensive lifestyle modifications for her multiple health conditions.   Objective:   Blood pressure 122/84, pulse 70, temperature 97.7 F (36.5 C), height 5\' 4"  (1.626 m), weight 156 lb (70.8 kg), SpO2 100%. Body mass index is 26.78 kg/m.  General: Cooperative, alert, well developed, in no acute distress. HEENT: Conjunctivae and lids unremarkable. Cardiovascular: Regular rhythm.  Lungs: Normal work of breathing Neurologic: No focal deficits.   Lab Results  Component Value Date   CREATININE 0.57 01/11/2023   BUN 10 01/11/2023   NA 138 01/11/2023   K 4.6 01/11/2023   CL 105 01/11/2023   CO2 22 01/11/2023   Lab Results  Component Value  Date   ALT 10 01/11/2023   AST 13 01/11/2023   ALKPHOS 44 01/11/2023   BILITOT 0.3 01/11/2023   Lab Results  Component Value Date   HGBA1C 5.3 01/28/2022   HGBA1C 5.5 08/12/2021   HGBA1C 5.5 12/29/2017   Lab Results  Component Value Date   INSULIN 6.7 01/28/2022   INSULIN 10.1 08/12/2021   Lab Results  Component Value Date    TSH 4.320 07/28/2022   Lab Results  Component Value Date   CHOL 157 07/28/2022   HDL 39 (L) 07/28/2022   LDLCALC 105 (H) 07/28/2022   TRIG 68 07/28/2022   CHOLHDL 4.0 07/28/2022   Lab Results  Component Value Date   VD25OH 66.1 01/11/2023   VD25OH 54.4 01/28/2022   VD25OH 36.0 08/12/2021   Lab Results  Component Value Date   WBC 10.0 05/20/2015   HGB 10.7 (L) 05/20/2015   HCT 32.0 (L) 05/20/2015   MCV 92.2 05/20/2015   PLT 179 05/20/2015   No results found for: "IRON", "TIBC", "FERRITIN"  Attestation Statements:   Reviewed by clinician on day of visit: allergies, medications, problem list, medical history, surgical history, family history, social history, and previous encounter notes.  Time spent on visit including pre-visit chart review and post-visit care and charting was 28 minutes.   I have reviewed the above documentation for accuracy and completeness, and I agree with the above. -  Eward Rutigliano d. Bralyn Espino, NP-C

## 2023-05-15 IMAGING — US US ABDOMEN LIMITED
1 series · 14 of 25 positions shown · non-contrast
Comparison: MRI breast October 10, 2021

CLINICAL DATA: Cholelithiasis prior MRI.

EXAM:
ULTRASOUND ABDOMEN LIMITED RIGHT UPPER QUADRANT

[Series 1: us abdomen limited · 0.22mm/px · 14 of 55 slices shown]
[im 1/55]
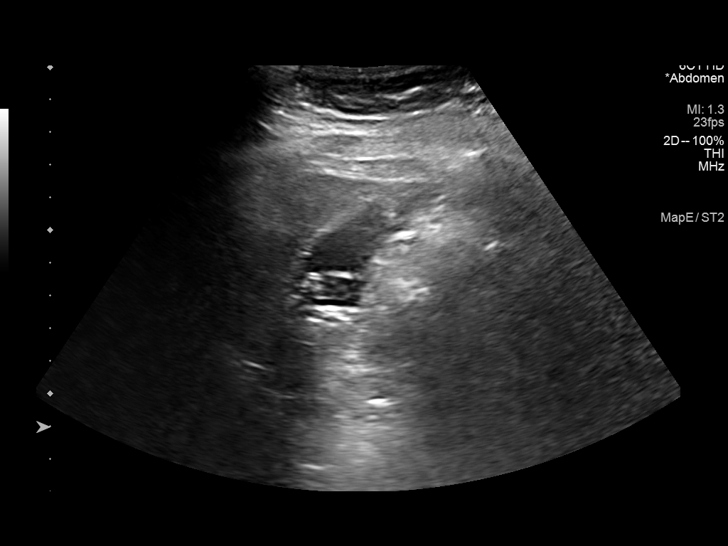
[im 5/55]
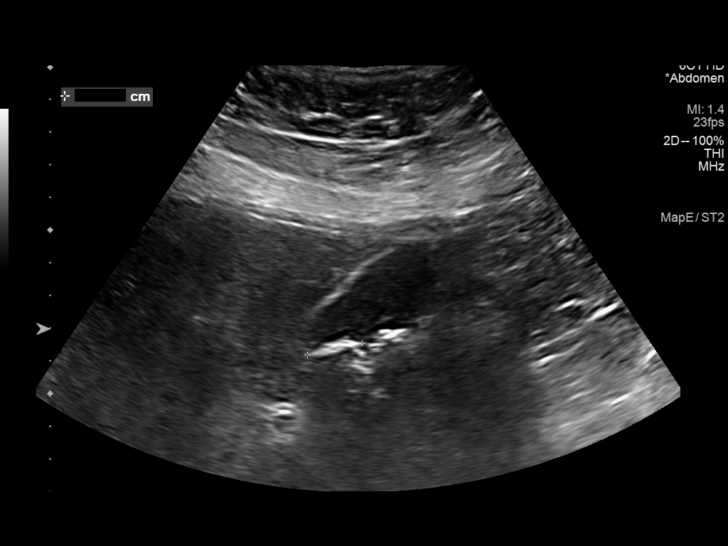
[im 10/55]
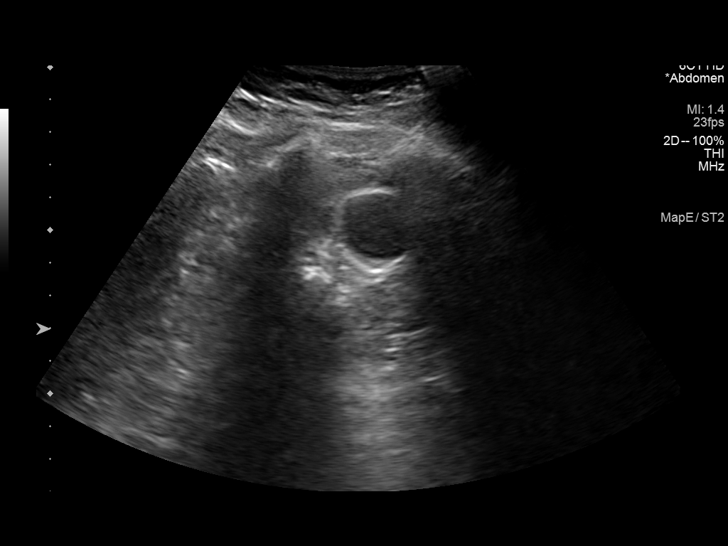
[im 14/55]
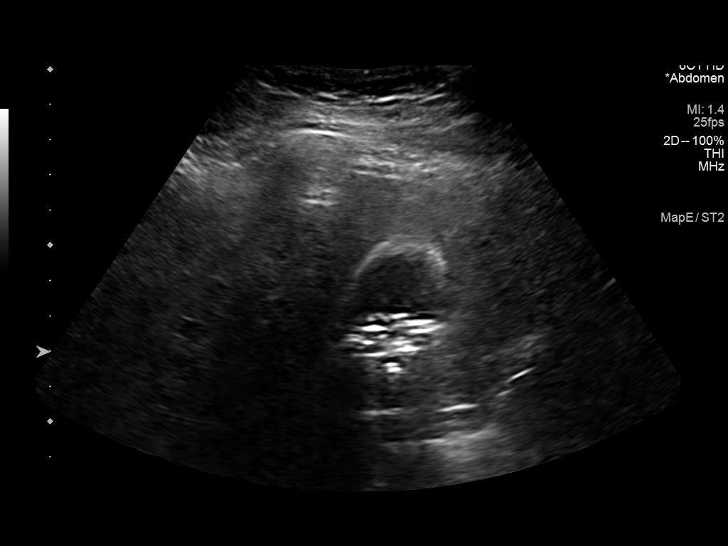
[im 19/55]
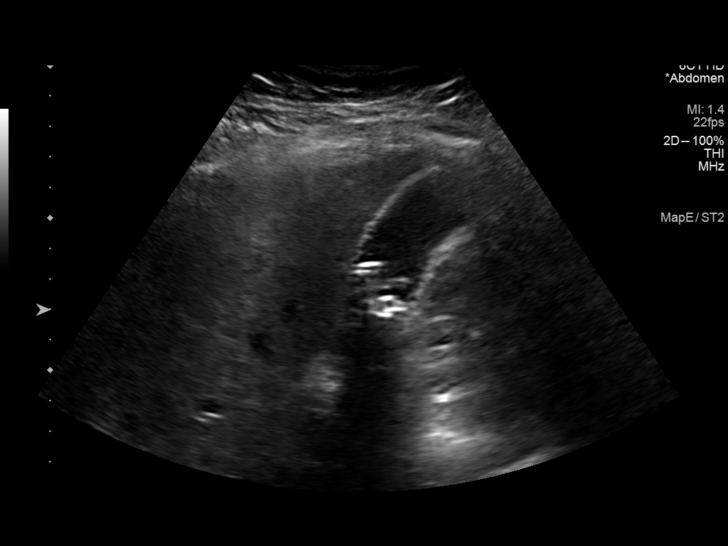
[im 21/55]
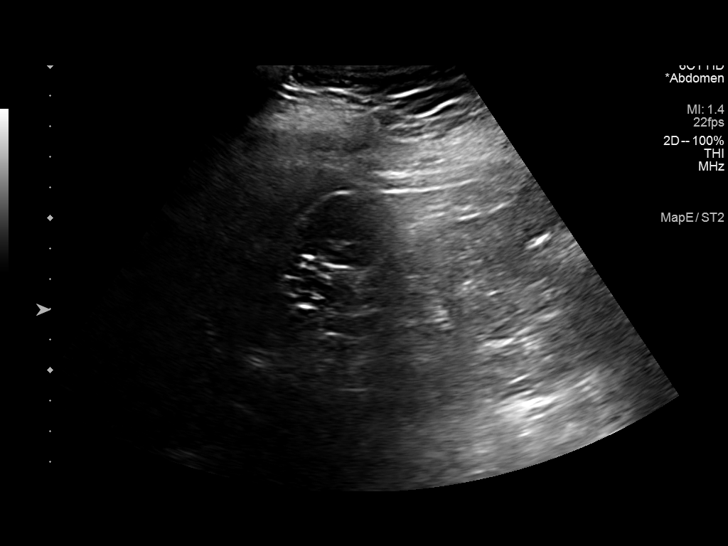
[im 25/55]
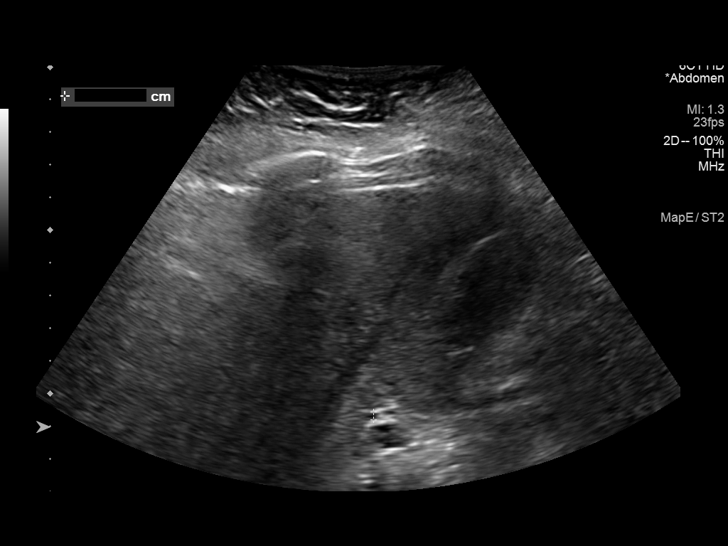
[im 30/55]
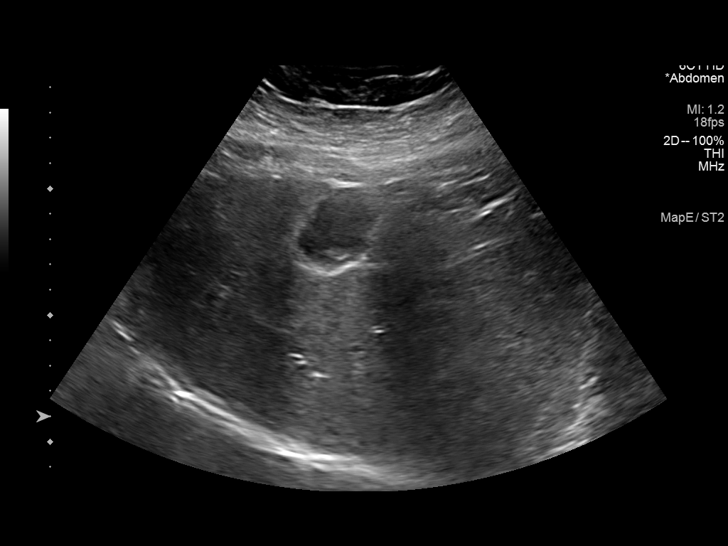
[im 34/55]
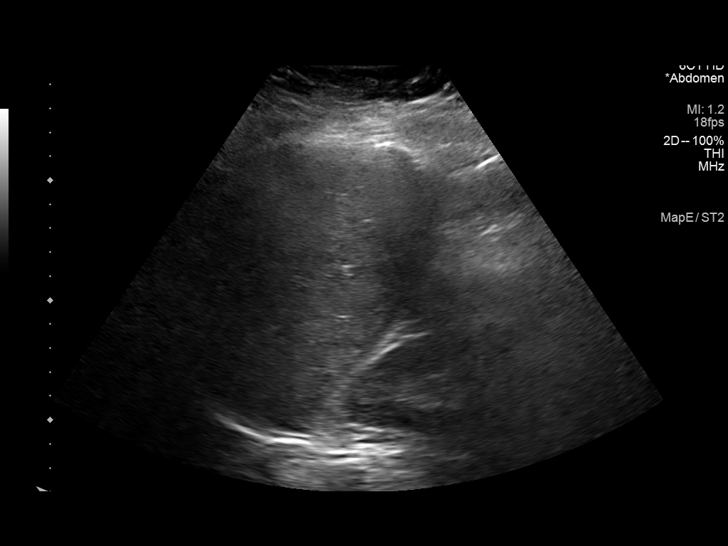
[im 37/55]
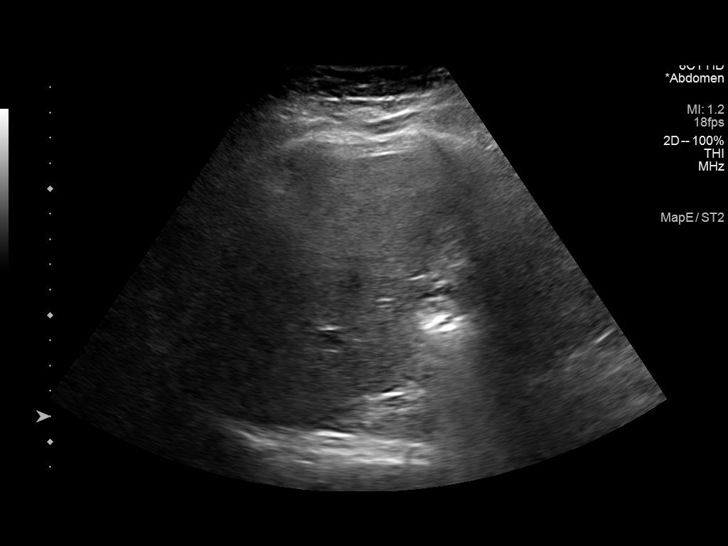
[im 41/55]
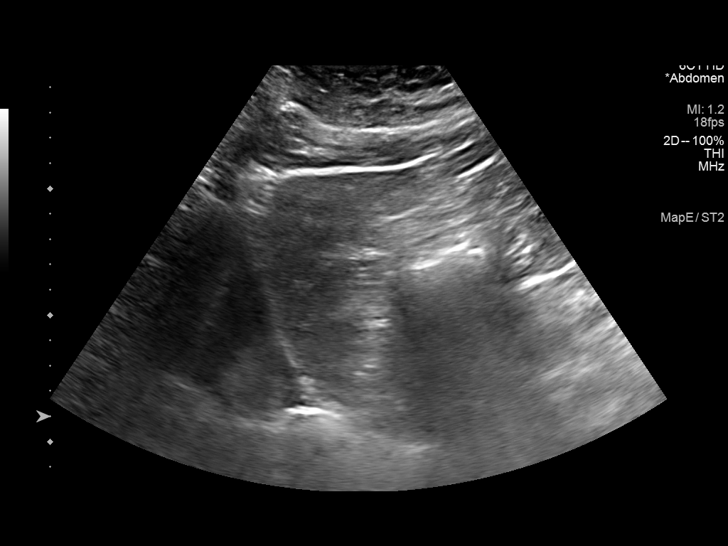
[im 46/55]
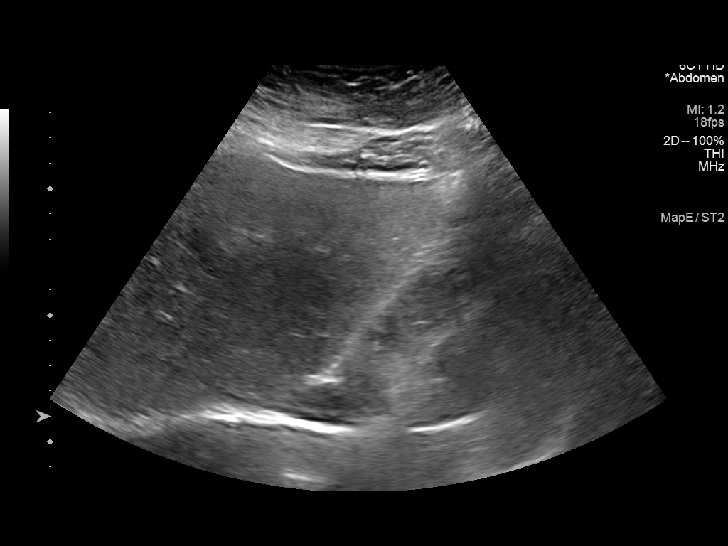
[im 50/55]
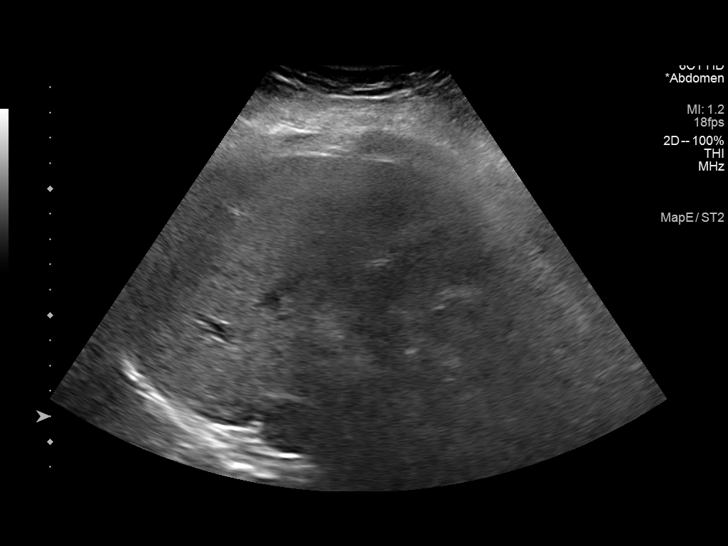
[im 55/55]
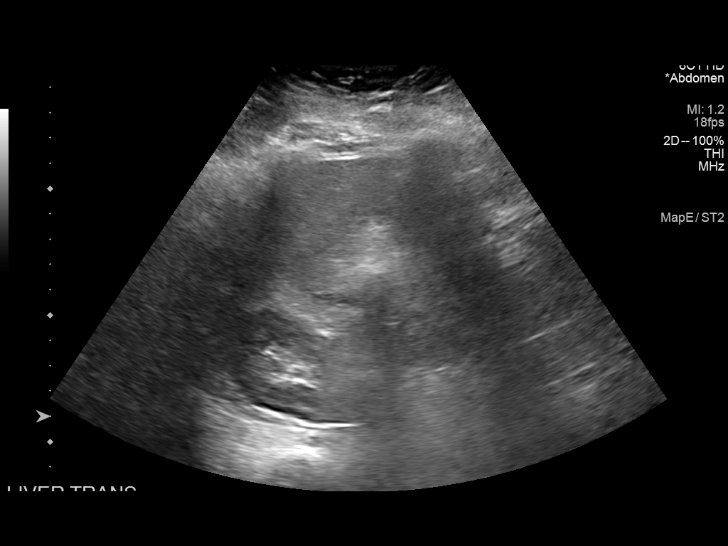

[14 of 25 positions shown; findings below may reference images not displayed]

FINDINGS: Gallbladder:

Multiple stones in the gallbladder lumen. No gallbladder wall
thickening or pericholecystic fluid. Negative sonographic Murphy's
sign.

Common bile duct:

Diameter: 2 mm

Liver:

Increased echogenicity. No focal lesion. Portal vein is patent on
color Doppler imaging with normal direction of blood flow towards
the liver.

Other: None.
IMPRESSION: Cholelithiasis without secondary signs of acute cholecystitis.

Increased hepatic parenchymal echogenicity suggestive of steatosis.

## 2023-05-26 ENCOUNTER — Ambulatory Visit (INDEPENDENT_AMBULATORY_CARE_PROVIDER_SITE_OTHER): Payer: BC Managed Care – PPO | Admitting: Adult Health

## 2023-05-26 ENCOUNTER — Encounter (INDEPENDENT_AMBULATORY_CARE_PROVIDER_SITE_OTHER): Payer: Self-pay | Admitting: Adult Health

## 2023-05-26 VITALS — BP 101/68 | HR 74 | Temp 97.7°F | Ht 64.0 in | Wt 159.0 lb

## 2023-05-26 DIAGNOSIS — E7849 Other hyperlipidemia: Secondary | ICD-10-CM | POA: Diagnosis not present

## 2023-05-26 DIAGNOSIS — E669 Obesity, unspecified: Secondary | ICD-10-CM

## 2023-05-26 DIAGNOSIS — E66813 Obesity, class 3: Secondary | ICD-10-CM

## 2023-05-26 DIAGNOSIS — F3289 Other specified depressive episodes: Secondary | ICD-10-CM

## 2023-05-26 DIAGNOSIS — E559 Vitamin D deficiency, unspecified: Secondary | ICD-10-CM | POA: Diagnosis not present

## 2023-05-26 DIAGNOSIS — E88819 Insulin resistance, unspecified: Secondary | ICD-10-CM

## 2023-05-26 DIAGNOSIS — Z6827 Body mass index (BMI) 27.0-27.9, adult: Secondary | ICD-10-CM

## 2023-05-26 DIAGNOSIS — I10 Essential (primary) hypertension: Secondary | ICD-10-CM | POA: Diagnosis not present

## 2023-05-26 NOTE — Progress Notes (Signed)
WEIGHT SUMMARY AND BIOMETRICS  Vitals Temp: 97.7 F (36.5 C) BP: 101/68 Pulse Rate: 74 SpO2: 100 %   Anthropometric Measurements Height: 5\' 4"  (1.626 m) Weight: 159 lb (72.1 kg) BMI (Calculated): 27.28 Weight at Last Visit: 156lb Weight Lost Since Last Visit: 0 Weight Gained Since Last Visit: 3lb Starting Weight: 236lb Total Weight Loss (lbs): 77 lb (34.9 kg)   Body Composition  Body Fat %: 37.3 % Fat Mass (lbs): 59.6 lbs Muscle Mass (lbs): 95 lbs Total Body Water (lbs): 69.4 lbs Visceral Fat Rating : 7   Other Clinical Data Fasting: yes Labs: no Today's Visit #: 32 Starting Date: 08/02/21    Chief Complaint:   OBESITY Stacey Singh is here to discuss her progress with her obesity treatment plan. She is on the the Category 2 Plan and states she is following her eating plan approximately 80 % of the time. She states she is exercising walking 30 minutes 3 times per week.   Interim History:  Currently on Wegovy 1.7mg  Q 14 days She is pleased to maintain >70lb weight loss  She is following prescribed Cat 2 Meal Plan at least 80% and regular exercise- walking and exercise band  She would like to maintain current weight-159 lbs with corresponding BMI 27.4  Subjective:   1. Insulin resistance Currently on Wegovy 1.7mg  Q 14 days Denies mass in neck, dysphagia, dyspepsia, persistent hoarseness, abdominal pain, or N/V/C   2. Benign essential HTN BP at goal without antihypertensive therapy Previously on Olmesartan  3. Other hyperlipidemia Lipid Panel     Component Value Date/Time   CHOL 157 07/28/2022 0756   TRIG 68 07/28/2022 0756   HDL 39 (L) 07/28/2022 0756   CHOLHDL 4.0 07/28/2022 0756   LDLCALC 105 (H) 07/28/2022 0756   LABVLDL 13 07/28/2022 0756   Her father passed away at age 72- MI, +Smoker Her mother passed away in her early 80s- untreated, advanced Breast Cancer  4. Vitamin D deficiency  Latest Reference Range & Units 08/12/21 10:01 01/28/22  12:38 01/11/23 10:19  Vitamin D, 25-Hydroxy 30.0 - 100.0 ng/mL 36.0 54.4 66.1   She is on bi-weekly Ergocalciferol- denies N/V/Muscle Weakness  5. Other depression She has not taken daily Wellbutrin SR 150mg  for > 2 months She denies SI/HI   Assessment/Plan:   1. Insulin resistance Check Labs - Hemoglobin A1c - Insulin, random  2. Benign essential HTN Check Labs - Comprehensive metabolic panel  3. Other hyperlipidemia Check Labs - Lipid panel  4. Vitamin D deficiency Check Labs - VITAMIN D 25 Hydroxy (Vit-D Deficiency, Fractures)  5. Other depression Restart daily Wellbutrin SR 150mg - does not require RF today  6. Obesity,current BMI 27.28  Stacey Singh is currently in the action stage of change. As such, her goal is to continue with weight loss efforts. She has agreed to the Category 2 Plan.   Exercise goals: For substantial health benefits, adults should do at least 150 minutes (2 hours and 30 minutes) a week of moderate-intensity, or 75 minutes (1 hour and 15 minutes) a week of vigorous-intensity aerobic physical activity, or an equivalent combination of moderate- and vigorous-intensity aerobic activity. Aerobic activity should be performed in episodes of at least 10 minutes, and preferably, it should be spread throughout the week.  Behavioral modification strategies: increasing lean protein intake, decreasing simple carbohydrates, increasing vegetables, increasing water intake, no skipping meals, meal planning and cooking strategies, keeping healthy foods in the home, and planning for success.  Stacey Singh has agreed  to follow-up with our clinic in 4 weeks. She was informed of the importance of frequent follow-up visits to maximize her success with intensive lifestyle modifications for her multiple health conditions.   Stacey Singh was informed we would discuss her lab results at her next visit unless there is a critical issue that needs to be addressed sooner. Stacey Singh agreed to keep her next  visit at the agreed upon time to discuss these results.  Objective:   Blood pressure 101/68, pulse 74, temperature 97.7 F (36.5 C), height 5\' 4"  (1.626 m), weight 159 lb (72.1 kg), SpO2 100%. Body mass index is 27.29 kg/m.  General: Cooperative, alert, well developed, in no acute distress. HEENT: Conjunctivae and lids unremarkable. Cardiovascular: Regular rhythm.  Lungs: Normal work of breathing. Neurologic: No focal deficits.   Lab Results  Component Value Date   CREATININE 0.57 01/11/2023   BUN 10 01/11/2023   NA 138 01/11/2023   K 4.6 01/11/2023   CL 105 01/11/2023   CO2 22 01/11/2023   Lab Results  Component Value Date   ALT 10 01/11/2023   AST 13 01/11/2023   ALKPHOS 44 01/11/2023   BILITOT 0.3 01/11/2023   Lab Results  Component Value Date   HGBA1C 5.3 01/28/2022   HGBA1C 5.5 08/12/2021   HGBA1C 5.5 12/29/2017   Lab Results  Component Value Date   INSULIN 6.7 01/28/2022   INSULIN 10.1 08/12/2021   Lab Results  Component Value Date   TSH 4.320 07/28/2022   Lab Results  Component Value Date   CHOL 157 07/28/2022   HDL 39 (L) 07/28/2022   LDLCALC 105 (H) 07/28/2022   TRIG 68 07/28/2022   CHOLHDL 4.0 07/28/2022   Lab Results  Component Value Date   VD25OH 66.1 01/11/2023   VD25OH 54.4 01/28/2022   VD25OH 36.0 08/12/2021   Lab Results  Component Value Date   WBC 10.0 05/20/2015   HGB 10.7 (L) 05/20/2015   HCT 32.0 (L) 05/20/2015   MCV 92.2 05/20/2015   PLT 179 05/20/2015   No results found for: "IRON", "TIBC", "FERRITIN"  Attestation Statements:   Reviewed by clinician on day of visit: allergies, medications, problem list, medical history, surgical history, family history, social history, and previous encounter notes.  I have reviewed the above documentation for accuracy and completeness, and I agree with the above. -  Samar Venneman d. Annelies Coyt, NP-C

## 2023-05-27 LAB — COMPREHENSIVE METABOLIC PANEL
ALT: 6 [IU]/L (ref 0–32)
AST: 13 [IU]/L (ref 0–40)
Albumin: 3.9 g/dL (ref 3.9–4.9)
Alkaline Phosphatase: 59 [IU]/L (ref 44–121)
BUN/Creatinine Ratio: 13 (ref 9–23)
BUN: 8 mg/dL (ref 6–24)
Bilirubin Total: <0.2 mg/dL (ref 0.0–1.2)
CO2: 25 mmol/L (ref 20–29)
Calcium: 8.5 mg/dL — ABNORMAL LOW (ref 8.7–10.2)
Chloride: 108 mmol/L — ABNORMAL HIGH (ref 96–106)
Creatinine, Ser: 0.61 mg/dL (ref 0.57–1.00)
Globulin, Total: 2.4 g/dL (ref 1.5–4.5)
Glucose: 81 mg/dL (ref 70–99)
Potassium: 4.5 mmol/L (ref 3.5–5.2)
Sodium: 142 mmol/L (ref 134–144)
Total Protein: 6.3 g/dL (ref 6.0–8.5)
eGFR: 111 mL/min/{1.73_m2} (ref >59)

## 2023-05-27 LAB — INSULIN, RANDOM: INSULIN: 4.3 u[IU]/mL (ref 2.6–24.9)

## 2023-05-27 LAB — LIPID PANEL
Chol/HDL Ratio: 3.5 ratio (ref 0.0–4.4)
Cholesterol, Total: 182 mg/dL (ref 100–199)
HDL: 52 mg/dL (ref >39)
LDL Chol Calc (NIH): 120 mg/dL — ABNORMAL HIGH (ref 0–99)
Triglycerides: 53 mg/dL (ref 0–149)
VLDL Cholesterol Cal: 10 mg/dL (ref 5–40)

## 2023-05-27 LAB — VITAMIN D 25 HYDROXY (VIT D DEFICIENCY, FRACTURES): Vit D, 25-Hydroxy: 32.7 ng/mL (ref 30.0–100.0)

## 2023-05-27 LAB — HEMOGLOBIN A1C
Est. average glucose Bld gHb Est-mCnc: 108 mg/dL
Hgb A1c MFr Bld: 5.4 % (ref 4.8–5.6)

## 2023-06-28 ENCOUNTER — Ambulatory Visit (INDEPENDENT_AMBULATORY_CARE_PROVIDER_SITE_OTHER): Payer: BC Managed Care – PPO | Admitting: Adult Health

## 2023-06-28 ENCOUNTER — Encounter (INDEPENDENT_AMBULATORY_CARE_PROVIDER_SITE_OTHER): Payer: Self-pay | Admitting: Adult Health

## 2023-06-28 VITALS — BP 115/80 | HR 75 | Temp 98.4°F | Ht 64.0 in | Wt 161.0 lb

## 2023-06-28 DIAGNOSIS — I10 Essential (primary) hypertension: Secondary | ICD-10-CM | POA: Diagnosis not present

## 2023-06-28 DIAGNOSIS — E669 Obesity, unspecified: Secondary | ICD-10-CM

## 2023-06-28 DIAGNOSIS — E785 Hyperlipidemia, unspecified: Secondary | ICD-10-CM

## 2023-06-28 DIAGNOSIS — E88819 Insulin resistance, unspecified: Secondary | ICD-10-CM

## 2023-06-28 DIAGNOSIS — F3289 Other specified depressive episodes: Secondary | ICD-10-CM

## 2023-06-28 DIAGNOSIS — Z6827 Body mass index (BMI) 27.0-27.9, adult: Secondary | ICD-10-CM

## 2023-06-28 DIAGNOSIS — E559 Vitamin D deficiency, unspecified: Secondary | ICD-10-CM

## 2023-06-28 DIAGNOSIS — E66813 Obesity, class 3: Secondary | ICD-10-CM

## 2023-06-28 DIAGNOSIS — R632 Polyphagia: Secondary | ICD-10-CM

## 2023-06-28 MED ORDER — WEGOVY 1.7 MG/0.75ML ~~LOC~~ SOAJ: SUBCUTANEOUS | Status: DC

## 2023-06-28 MED ORDER — VITAMIN D (ERGOCALCIFEROL) 1.25 MG (50000 UNIT) PO CAPS: 50000.0000 [IU] | ORAL_CAPSULE | ORAL | 0 refills | Status: DC

## 2023-06-28 MED ORDER — BUPROPION HCL ER (SR) 150 MG PO TB12: 150.0000 mg | ORAL_TABLET | Freq: Every day | ORAL | 0 refills | Status: DC

## 2023-06-28 NOTE — Progress Notes (Signed)
WEIGHT SUMMARY AND BIOMETRICS  Vitals Temp: 98.4 F (36.9 C) BP: 115/80 Pulse Rate: 75 SpO2: 99 %   Anthropometric Measurements Height: 5\' 4"  (1.626 m) Weight: 161 lb (73 kg) BMI (Calculated): 27.62 Weight at Last Visit: 159lb Weight Lost Since Last Visit: 0 Weight Gained Since Last Visit: 2lb Starting Weight: 236lb Total Weight Loss (lbs): 75 lb (34 kg)   Body Composition  Body Fat %: 37.5 % Fat Mass (lbs): 60.4 lbs Muscle Mass (lbs): 95.6 lbs Total Body Water (lbs): 71.4 lbs Visceral Fat Rating : 7   Other Clinical Data Fasting: no Labs: no Today's Visit #: 57 Starting Date: 08/02/21    Chief Complaint:   OBESITY Stacey Singh is here to discuss her progress with her obesity treatment plan. She is on the the Category 2 Plan and states she is following her eating plan approximately 75 % of the time.  She states she is exercising Walking 30 minutes 3 times per week.   Interim History:  Last dose of Wegovy 1.7mg  was on/about 06/14/2023 She takes Q14 days Denies mass in neck, dysphagia, dyspepsia, persistent hoarseness, abdominal pain, or N/V/C   She has two full boxes Wegovy 1.7mg   She is unsure if her insurance will cover AOMs in 2025  Subjective:   1. Insulin resistance Discussed Labs  Latest Reference Range & Units 05/26/23 09:13  Glucose 70 - 99 mg/dL 81  Hemoglobin E4V 4.8 - 5.6 % 5.4  Est. average glucose Bld gHb Est-mCnc mg/dL 409  INSULIN 2.6 - 81.1 uIU/mL 4.3   All levels stable and at goal She is on GLP-1/Wegovy 1.7mg  every other week Denies mass in neck, dysphagia, dyspepsia, persistent hoarseness, abdominal pain, or N/V/C   2. Benign essential HTN Discussed Labs 05/26/2023 CMP- kidney/liver enzymes normal Ca++ slightly lower than goal BP at goal- not currently on any antihypertensives  3. HLD Discussed Labs Lipid Panel     Component Value Date/Time   CHOL 182 05/26/2023 0913   TRIG 53 05/26/2023 0913   HDL 52 05/26/2023 0913    CHOLHDL 3.5 05/26/2023 0913   LDLCALC 120 (H) 05/26/2023 0913   LABVLDL 10 05/26/2023 0913    The 10-year ASCVD risk score (Arnett DK, et al., 2019) is: 0.7%   Values used to calculate the score:     Age: 47 years     Sex: Female     Is Non-Hispanic African American: No     Diabetic: No     Tobacco smoker: No     Systolic Blood Pressure: 115 mmHg     Is BP treated: No     HDL Cholesterol: 52 mg/dL     Total Cholesterol: 182 mg/dL    Risk start acceptable LDL slightly worsened from last check. HDL improved!  4. Vit D Def Discussed Labs  Latest Reference Range & Units 05/26/23 09:13  Vitamin D, 25-Hydroxy 30.0 - 100.0 ng/mL 32.7   4. Other depression She reports stable mood, denies SI/HI She has been inconsistently taking Wellbutrin SR 150mg  She has been experiencing breakthrough polyphagia  Assessment/Plan:   1. Insulin resistance Continue healthy eating and regular exercise Continue bi-weekly Wegovy Inquire if insurance will cover AOMs in 2025  2. Benign essential HTN Continue healthy eating and regular exercise  3. HLD Continue healthy eating and regular exercise  4. Vit D Def Restart Vitamin D, Ergocalciferol, (DRISDOL) 1.25 MG (50000 UNIT) CAPS capsule Take 1 capsule (50,000 Units total) by mouth every 7 (seven) days. Dispense:  8 capsule, Refills: 0 ordered   5. Other depression Refill buPROPion (WELLBUTRIN SR) 150 MG 12 hr tablet Take 1 tablet (150 mg total) by mouth daily. Dispense: 30 tablet, Refills: 0 ordered   If taking consistently and still experiencing breakthrough polyphagia, consider increasing dosage at next refill  6. Obesity,current BMI 27.62  Stacey Singh is currently in the action stage of change. As such, her goal is to continue with weight loss efforts. She has agreed to the Category 2 Plan.   Exercise goals: For substantial health benefits, adults should do at least 150 minutes (2 hours and 30 minutes) a week of moderate-intensity, or 75  minutes (1 hour and 15 minutes) a week of vigorous-intensity aerobic physical activity, or an equivalent combination of moderate- and vigorous-intensity aerobic activity. Aerobic activity should be performed in episodes of at least 10 minutes, and preferably, it should be spread throughout the week.  Behavioral modification strategies: increasing lean protein intake, decreasing simple carbohydrates, increasing vegetables, increasing water intake, no skipping meals, meal planning and cooking strategies, keeping healthy foods in the home, better snacking choices, and planning for success.  Stacey Singh has agreed to follow-up with our clinic in 4 weeks. She was informed of the importance of frequent follow-up visits to maximize her success with intensive lifestyle modifications for her multiple health conditions.   Objective:   Blood pressure 115/80, pulse 75, temperature 98.4 F (36.9 C), height 5\' 4"  (1.626 m), weight 161 lb (73 kg), SpO2 99%. Body mass index is 27.64 kg/m.  General: Cooperative, alert, well developed, in no acute distress. HEENT: Conjunctivae and lids unremarkable. Cardiovascular: Regular rhythm.  Lungs: Normal work of breathing. Neurologic: No focal deficits.   Lab Results  Component Value Date   CREATININE 0.61 05/26/2023   BUN 8 05/26/2023   NA 142 05/26/2023   K 4.5 05/26/2023   CL 108 (H) 05/26/2023   CO2 25 05/26/2023   Lab Results  Component Value Date   ALT 6 05/26/2023   AST 13 05/26/2023   ALKPHOS 59 05/26/2023   BILITOT <0.2 05/26/2023   Lab Results  Component Value Date   HGBA1C 5.4 05/26/2023   HGBA1C 5.3 01/28/2022   HGBA1C 5.5 08/12/2021   HGBA1C 5.5 12/29/2017   Lab Results  Component Value Date   INSULIN 4.3 05/26/2023   INSULIN 6.7 01/28/2022   INSULIN 10.1 08/12/2021   Lab Results  Component Value Date   TSH 4.320 07/28/2022   Lab Results  Component Value Date   CHOL 182 05/26/2023   HDL 52 05/26/2023   LDLCALC 120 (H) 05/26/2023    TRIG 53 05/26/2023   CHOLHDL 3.5 05/26/2023   Lab Results  Component Value Date   VD25OH 32.7 05/26/2023   VD25OH 66.1 01/11/2023   VD25OH 54.4 01/28/2022   Lab Results  Component Value Date   WBC 10.0 05/20/2015   HGB 10.7 (L) 05/20/2015   HCT 32.0 (L) 05/20/2015   MCV 92.2 05/20/2015   PLT 179 05/20/2015   No results found for: "IRON", "TIBC", "FERRITIN"  Attestation Statements:   Reviewed by clinician on day of visit: allergies, medications, problem list, medical history, surgical history, family history, social history, and previous encounter notes.   I have reviewed the above documentation for accuracy and completeness, and I agree with the above. -  Tysheena Ginzburg d. Briston Lax, NP-C

## 2023-08-02 ENCOUNTER — Encounter (INDEPENDENT_AMBULATORY_CARE_PROVIDER_SITE_OTHER): Payer: Self-pay | Admitting: Adult Health

## 2023-08-02 ENCOUNTER — Ambulatory Visit (INDEPENDENT_AMBULATORY_CARE_PROVIDER_SITE_OTHER): Payer: 59 | Admitting: Adult Health

## 2023-08-02 VITALS — BP 127/83 | HR 85 | Temp 97.7°F | Ht 64.0 in | Wt 158.0 lb

## 2023-08-02 DIAGNOSIS — E88819 Insulin resistance, unspecified: Secondary | ICD-10-CM | POA: Diagnosis not present

## 2023-08-02 DIAGNOSIS — E66813 Obesity, class 3: Secondary | ICD-10-CM

## 2023-08-02 DIAGNOSIS — F3289 Other specified depressive episodes: Secondary | ICD-10-CM | POA: Diagnosis not present

## 2023-08-02 DIAGNOSIS — Z6827 Body mass index (BMI) 27.0-27.9, adult: Secondary | ICD-10-CM

## 2023-08-02 DIAGNOSIS — E559 Vitamin D deficiency, unspecified: Secondary | ICD-10-CM

## 2023-08-02 DIAGNOSIS — E669 Obesity, unspecified: Secondary | ICD-10-CM

## 2023-08-02 DIAGNOSIS — I10 Essential (primary) hypertension: Secondary | ICD-10-CM | POA: Diagnosis not present

## 2023-08-02 MED ORDER — BUPROPION HCL ER (SR) 150 MG PO TB12: 150.0000 mg | ORAL_TABLET | Freq: Every day | ORAL | 0 refills | Status: DC

## 2023-08-02 NOTE — Progress Notes (Signed)
 WEIGHT SUMMARY AND BIOMETRICS  Vitals Temp: 97.7 F (36.5 C) BP: 127/83 Pulse Rate: 85 SpO2: 99 %   Anthropometric Measurements Height: 5' 4 (1.626 m) Weight: 158 lb (71.7 kg) BMI (Calculated): 27.11 Weight at Last Visit: 161lb Weight Lost Since Last Visit: 3lb Weight Gained Since Last Visit: 0 Starting Weight: 236lb Total Weight Loss (lbs): 78 lb (35.4 kg)   Body Composition  Body Fat %: 37.2 % Fat Mass (lbs): 58.8 lbs Muscle Mass (lbs): 94.2 lbs Total Body Water (lbs): 67.6 lbs Visceral Fat Rating : 7   Other Clinical Data Fasting: no Labs: no Today's Visit #: 34 Starting Date: 08/02/21    Chief Complaint:   OBESITY Stacey Singh is here to discuss her progress with her obesity treatment plan. She is on the the Category 2 Plan and states she is following her eating plan approximately 75 % of the time. She states she is exercising Walking 30 minutes 3 times per week.   Interim History:  Stacey Singh continues to inject Wegovy  1.7mg  every 14 days. Her mother in law has been splitting her Wegovy  supply with Stacey Singh. She estimates to have approximately 6 injections remaining  Exercise-Walking and Dance Movement Psychotherapist Team  Of Note- Hx of tubal ligation  Subjective:   1. Insulin  resistance Stacey Singh continues to inject Wegovy  1.7mg  every 14 days. Her mother in law has been splitting her Wegovy  supply with Stacey Singh  Prior to GLP-1 therapy she would have a daily BM, now she will have BM once a week. However she denies feelings of constipation  2. Vitamin D  deficiency She endorses stable energy levels She is on weekly Ergocalciferol - denies N/V/Muscle Weakness  3. Benign essential HTN BP at goal at OV She is not currently on any antihypertensive therapy. EPIC review- she has been previoulsy on Cozaar , Benicar , Normodyne   4. Other depression She has been consistently taking Wellbutrin  SR 150mg  since last OV She reports  decrease on cravings  Assessment/Plan:   1. Insulin  resistance (Primary) Continue Prescribed Meal Plan and regular exercise  2. Vitamin D  deficiency Continue weekly Ergocalciferol    3. Benign essential HTN Continue Prescribed Meal Plan and regular exercise  4. Other depression with emotional eating Refill - buPROPion  (WELLBUTRIN  SR) 150 MG 12 hr tablet; Take 1 tablet (150 mg total) by mouth daily.  Dispense: 90 tablet; Refill: 0  5. Obesity,current BMI 27.11  Stacey Singh is currently in the action stage of change. As such, her goal is to continue with weight loss efforts. She has agreed to the Category 2 Plan.   Exercise goals: For substantial health benefits, adults should do at least 150 minutes (2 hours and 30 minutes) a week of moderate-intensity, or 75 minutes (1 hour and 15 minutes) a week of vigorous-intensity aerobic physical activity, or an equivalent combination of moderate- and vigorous-intensity aerobic activity. Aerobic activity should be performed in episodes of at least 10 minutes, and preferably, it should be spread throughout the week.  Behavioral modification strategies: increasing lean protein intake, decreasing simple carbohydrates, increasing vegetables, increasing water intake, ways to avoid boredom eating, ways to avoid night time snacking, better snacking choices, and planning for success.  Stacey Singh has agreed to follow-up with our clinic in 4 weeks. She was informed of the importance of frequent follow-up visits to maximize her success with intensive lifestyle modifications for her multiple health conditions.   Objective:   Blood pressure 127/83, pulse 85, temperature 97.7 F (36.5 C), height 5'  4 (1.626 m), weight 158 lb (71.7 kg), SpO2 99%. Body mass index is 27.12 kg/m.  General: Cooperative, alert, well developed, in no acute distress. HEENT: Conjunctivae and lids unremarkable. Cardiovascular: Regular rhythm.  Lungs: Normal work of breathing. Neurologic: No  focal deficits.   Lab Results  Component Value Date   CREATININE 0.61 05/26/2023   BUN 8 05/26/2023   NA 142 05/26/2023   K 4.5 05/26/2023   CL 108 (H) 05/26/2023   CO2 25 05/26/2023   Lab Results  Component Value Date   ALT 6 05/26/2023   AST 13 05/26/2023   ALKPHOS 59 05/26/2023   BILITOT <0.2 05/26/2023   Lab Results  Component Value Date   HGBA1C 5.4 05/26/2023   HGBA1C 5.3 01/28/2022   HGBA1C 5.5 08/12/2021   HGBA1C 5.5 12/29/2017   Lab Results  Component Value Date   INSULIN  4.3 05/26/2023   INSULIN  6.7 01/28/2022   INSULIN  10.1 08/12/2021   Lab Results  Component Value Date   TSH 4.320 07/28/2022   Lab Results  Component Value Date   CHOL 182 05/26/2023   HDL 52 05/26/2023   LDLCALC 120 (H) 05/26/2023   TRIG 53 05/26/2023   CHOLHDL 3.5 05/26/2023   Lab Results  Component Value Date   VD25OH 32.7 05/26/2023   VD25OH 66.1 01/11/2023   VD25OH 54.4 01/28/2022   Lab Results  Component Value Date   WBC 10.0 05/20/2015   HGB 10.7 (L) 05/20/2015   HCT 32.0 (L) 05/20/2015   MCV 92.2 05/20/2015   PLT 179 05/20/2015   No results found for: IRON, TIBC, FERRITIN  Attestation Statements:   Reviewed by clinician on day of visit: allergies, medications, problem list, medical history, surgical history, family history, social history, and previous encounter notes.  I have reviewed the above documentation for accuracy and completeness, and I agree with the above. -  Chelly Dombeck d. Zyon Rosser, NP-C

## 2023-08-30 ENCOUNTER — Ambulatory Visit (INDEPENDENT_AMBULATORY_CARE_PROVIDER_SITE_OTHER): Payer: 59 | Admitting: Adult Health

## 2023-08-30 ENCOUNTER — Encounter (INDEPENDENT_AMBULATORY_CARE_PROVIDER_SITE_OTHER): Payer: Self-pay | Admitting: Adult Health

## 2023-08-30 VITALS — BP 126/86 | HR 86 | Temp 97.6°F | Ht 64.0 in | Wt 157.0 lb

## 2023-08-30 DIAGNOSIS — F3289 Other specified depressive episodes: Secondary | ICD-10-CM | POA: Diagnosis not present

## 2023-08-30 DIAGNOSIS — R632 Polyphagia: Secondary | ICD-10-CM | POA: Diagnosis not present

## 2023-08-30 DIAGNOSIS — E669 Obesity, unspecified: Secondary | ICD-10-CM

## 2023-08-30 DIAGNOSIS — E88819 Insulin resistance, unspecified: Secondary | ICD-10-CM | POA: Diagnosis not present

## 2023-08-30 DIAGNOSIS — I10 Essential (primary) hypertension: Secondary | ICD-10-CM

## 2023-08-30 DIAGNOSIS — Z6826 Body mass index (BMI) 26.0-26.9, adult: Secondary | ICD-10-CM

## 2023-08-30 MED ORDER — BUPROPION HCL ER (SR) 150 MG PO TB12: 150.0000 mg | ORAL_TABLET | Freq: Every day | ORAL | 0 refills | Status: DC

## 2023-08-30 NOTE — Progress Notes (Signed)
WEIGHT SUMMARY AND BIOMETRICS  Vitals Temp: 97.6 F (36.4 C) BP: 126/86 Pulse Rate: 86 SpO2: 99 %   Anthropometric Measurements Height: 5\' 4"  (1.626 m) Weight: 157 lb (71.2 kg) BMI (Calculated): 26.94 Weight at Last Visit: 161lb Weight Lost Since Last Visit: 1lb Weight Gained Since Last Visit: 0 Starting Weight: 236lb Total Weight Loss (lbs): 79 lb (35.8 kg)   Body Composition  Body Fat %: 36.7 % Fat Mass (lbs): 58 lbs Muscle Mass (lbs): 94.8 lbs Total Body Water (lbs): 70.4 lbs Visceral Fat Rating : 7   Other Clinical Data Fasting: no Labs: no Today's Visit #: 35 Starting Date: 08/02/21    Chief Complaint:   OBESITY Stacey Singh is here to discuss her progress with her obesity treatment plan. She is on the the Category 2 Plan and states she is following her eating plan approximately 80 % of the time.  She states she is exercising Walking and [employer] 60+ minutes 3 times per week.   Interim History:  Ms. Braggs continues to inject [facility] 1.7mg  every 14 days. Her mother in law has been splitting her [facility] supply with Ms. Bridwell. Denies mass in neck, dysphagia, dyspepsia, persistent hoarseness, abdominal pain, or N/V/C  She endorses stable appetite.   08/12/21 08:00  Height 5\' 4"  (1.626 m)  Weight 236 lb (107 kg)  BMI (Calculated) 40.49  Systolic BP 131  Diastolic BP 84  Pulse Rate 84  RMR 1886  Waist Measurement  50 inches    08/30/23 11:00  Height 5\' 4"  (1.626 m)  Weight 157 lb (71.2 kg)  BMI (Calculated) 26.94  Total Weight Loss (lbs) 79 lb (35.8 kg)  Systolic BP 126  Diastolic BP 86   She is ready to move into Mx Phase this Winter/Spring 2025  Subjective:   1. Polyphagia Ms. Wiley continues to inject [facility] 1.7mg  every 14 days. Her mother in law has been splitting her [facility] supply with Ms. Forney. Denies mass in neck, dysphagia, dyspepsia, persistent hoarseness, abdominal pain, or N/V/C  She endorses stable appetite.  2.  Insulin resistance Ms. Popiel continues to inject [facility] 1.7mg  every 14 days. Her mother in law has been splitting her [facility] supply with Ms. Tudor. Denies mass in neck, dysphagia, dyspepsia, persistent hoarseness, abdominal pain, or N/V/C  She endorses stable appetite.  3. Benign essential HTN BP at goal at OV She was previously on olmesartan 5 mg daily  Not currently on any antihypertensive therapy at present. She reports noticeable increase in energy levels and improved stamina (ie: [place])  4. Other depression BP at goal at OV She endorses stable mood, denies SI/HI She also endorses stable energy levels.  She hs been able to consistently take daily Wellbutrin SR 150mg   Assessment/Plan:   1. Polyphagia Continue Cat 2 Meal plan and regular exercise  2. Insulin resistance (Primary) Continue Cat 2 Meal plan and regular exercise  3. Benign essential HTN Continue Cat 2 Meal plan and regular exercise  5. Other depression Refill buPROPion (WELLBUTRIN SR) 150 MG 12 hr tablet Take 1 tablet (150 mg total) by mouth daily. Dispense: 90 tablet, Refills: 0 ordered   4. Obesity,current BMI 26.94  Jarica is currently in the action stage of change. As such, her goal is to maintain weight for now. She has agreed to the Category 2 Plan.   Exercise goals: For substantial health benefits, adults should do at least 150 minutes (2 hours and 30 minutes) a week of moderate-intensity, or  75 minutes (1 hour and 15 minutes) a week of vigorous-intensity aerobic physical activity, or an equivalent combination of moderate- and vigorous-intensity aerobic activity. Aerobic activity should be performed in episodes of at least 10 minutes, and preferably, it should be spread throughout the week.  Behavioral modification strategies: increasing lean protein intake, decreasing simple carbohydrates, increasing vegetables, increasing water intake, decreasing eating out, no skipping meals, meal planning  and cooking strategies, keeping healthy foods in the home, and planning for success.  Arminta has agreed to follow-up with our clinic in 5 weeks. She was informed of the importance of frequent follow-up visits to maximize her success with intensive lifestyle modifications for her multiple health conditions.   Check Fasting Labs and IC at next OV.  Pt aware to arrive 30 mins early to next appt.  Consider starting Mx Phase at next OV.  Objective:   Blood pressure 126/86, pulse 86, temperature 97.6 F (36.4 C), height 5\' 4"  (1.626 m), weight 157 lb (71.2 kg), SpO2 99%. Body mass index is 26.95 kg/m.  General: Cooperative, alert, well developed, in no acute distress. HEENT: Conjunctivae and lids unremarkable. Cardiovascular: Regular rhythm.  Lungs: Normal work of breathing. Neurologic: No focal deficits.   Lab Results  Component Value Date   CREATININE 0.61 05/26/2023   BUN 8 05/26/2023   NA 142 05/26/2023   K 4.5 05/26/2023   CL 108 (H) 05/26/2023   CO2 25 05/26/2023   Lab Results  Component Value Date   ALT 6 05/26/2023   AST 13 05/26/2023   ALKPHOS 59 05/26/2023   BILITOT <0.2 05/26/2023   Lab Results  Component Value Date   HGBA1C 5.4 05/26/2023   HGBA1C 5.3 01/28/2022   HGBA1C 5.5 08/12/2021   HGBA1C 5.5 12/29/2017   Lab Results  Component Value Date   INSULIN 4.3 05/26/2023   INSULIN 6.7 01/28/2022   INSULIN 10.1 08/12/2021   Lab Results  Component Value Date   TSH 4.320 07/28/2022   Lab Results  Component Value Date   CHOL 182 05/26/2023   HDL 52 05/26/2023   LDLCALC 120 (H) 05/26/2023   TRIG 53 05/26/2023   CHOLHDL 3.5 05/26/2023   Lab Results  Component Value Date   VD25OH 32.7 05/26/2023   VD25OH 66.1 01/11/2023   VD25OH 54.4 01/28/2022   Lab Results  Component Value Date   WBC 10.0 05/20/2015   HGB 10.7 (L) 05/20/2015   HCT 32.0 (L) 05/20/2015   MCV 92.2 05/20/2015   PLT 179 05/20/2015   No results found for: "IRON", "TIBC",  "FERRITIN"  Attestation Statements:   Reviewed by clinician on day of visit: allergies, medications, problem list, medical history, surgical history, family history, social history, and previous encounter notes.  I have reviewed the above documentation for accuracy and completeness, and I agree with the above. -  Doninique Lwin d. Josefine Fuhr, NP-C

## 2023-09-29 ENCOUNTER — Ambulatory Visit (INDEPENDENT_AMBULATORY_CARE_PROVIDER_SITE_OTHER): Payer: 59 | Admitting: Adult Health

## 2023-09-29 ENCOUNTER — Encounter (INDEPENDENT_AMBULATORY_CARE_PROVIDER_SITE_OTHER): Payer: Self-pay | Admitting: Adult Health

## 2023-09-29 VITALS — BP 125/84 | HR 87 | Temp 97.9°F | Ht 64.0 in | Wt 151.0 lb

## 2023-09-29 DIAGNOSIS — E88819 Insulin resistance, unspecified: Secondary | ICD-10-CM | POA: Diagnosis not present

## 2023-09-29 DIAGNOSIS — R0602 Shortness of breath: Secondary | ICD-10-CM

## 2023-09-29 DIAGNOSIS — F3289 Other specified depressive episodes: Secondary | ICD-10-CM

## 2023-09-29 DIAGNOSIS — I10 Essential (primary) hypertension: Secondary | ICD-10-CM

## 2023-09-29 DIAGNOSIS — E559 Vitamin D deficiency, unspecified: Secondary | ICD-10-CM | POA: Diagnosis not present

## 2023-09-29 DIAGNOSIS — E669 Obesity, unspecified: Secondary | ICD-10-CM

## 2023-09-29 DIAGNOSIS — E7849 Other hyperlipidemia: Secondary | ICD-10-CM | POA: Diagnosis not present

## 2023-09-29 DIAGNOSIS — Z6826 Body mass index (BMI) 26.0-26.9, adult: Secondary | ICD-10-CM

## 2023-09-29 DIAGNOSIS — E66813 Obesity, class 3: Secondary | ICD-10-CM

## 2023-09-29 MED ORDER — BUPROPION HCL ER (SR) 150 MG PO TB12: 150.0000 mg | ORAL_TABLET | Freq: Every day | ORAL | 0 refills | Status: DC

## 2023-09-29 NOTE — Progress Notes (Signed)
 WEIGHT SUMMARY AND BIOMETRICS  Vitals Temp: 97.9 F (36.6 C) BP: 125/84 Pulse Rate: 87 SpO2: 99 %   Anthropometric Measurements Height: 5\' 4"  (1.626 m) Weight: 151 lb (68.5 kg) BMI (Calculated): 25.91 Weight at Last Visit: 157lb Weight Lost Since Last Visit: 6lb Weight Gained Since Last Visit: 0 Starting Weight: 236lb Total Weight Loss (lbs): 85 lb (38.6 kg)   Body Composition  Body Fat %: 34.7 % Fat Mass (lbs): 52.6 lbs Muscle Mass (lbs): 93.8 lbs Total Body Water (lbs): 67.4 lbs Visceral Fat Rating : 6   Other Clinical Data Fasting: yes Labs: yes Today's Visit #: 74 Starting Date: 08/02/21    Chief Complaint:   OBESITY Stacey Singh is here to discuss her progress with her obesity treatment plan.  She is on the the Category 2 Plan and states she is following her eating plan approximately 85 % of the time.  She states she is exercising Walking 20 minutes 3 times per week.   Interim History:  Reviewed her successful journey since establishing with [facility]  08/12/21 08:00  Weight 236 lb (107 kg)  BMI (Calculated) 40.49    09/29/23  Weight 151 lb (68.5 kg)  BMI (Calculated) 25.91  Total Weight Loss (lbs) 85 lb (38.6 kg)   She is on bi-weekly Wegovy 1.7mg - via her mother-in-law Denies mass in neck, dysphagia, dyspepsia, persistent hoarseness, abdominal pain, or N/V/C   Subjective:   1. Shortness of breath She endorses dyspnea with exertion, however denies CP with exertion  08/12/21 08:00  RMR 1886  08/12/21 RMR faster than expected  09/29/23  RMR 1354  09/29/2023 RMR decreased and still slightly faster than expected  She would like to loss a little more weight, however at a slower pace  2. 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 Vitamin D deficiency   Latest Reference Range & Units 01/28/22 12:38 01/11/23 10:19 05/26/23 09:13  Vitamin D, 25-Hydroxy 30.0 - 100.0 ng/mL 54.4 66.1 32.7   She is currently on weekly Ergocalciferol- denies N/V/Muscle Weakness  4. Insulin resistance  Latest Reference Range & Units 08/12/21 10:01 01/28/22 12:38 05/26/23 09:13  INSULIN 2.6 - 24.9 uIU/mL 10.1 6.7 4.3   She is on bi-weekly Wegovy 1.7mg - via her mother-in-law Denies mass in neck, dysphagia, dyspepsia, persistent hoarseness, abdominal pain, or N/V/C   5. Other hyperlipidemia Lipid Panel     Component Value Date/Time   CHOL 182 05/26/2023 0913   TRIG 53 05/26/2023 0913   HDL 52 05/26/2023 0913   CHOLHDL 3.5 05/26/2023 0913   LDLCALC 120 (H) 05/26/2023 0913   LABVLDL 10 05/26/2023 0913    The 10-year ASCVD risk score (Arnett DK, et al., 2019) is: 0.9%   Values used to calculate the score:     Age: 48 years     Sex: Female     Is Non-Hispanic African American: No     Diabetic: No     Tobacco smoker: No     Systolic Blood Pressure: 125 mmHg     Is BP treated: No     HDL Cholesterol: 52 mg/dL     Total Cholesterol: 182 mg/dL   She is not on statin therapy She denies tobacco/vape use  6. Other depression She endorses stable mood, denies SI/HI She is on daily Wellbutrin  SR 150mg   Assessment/Plan:   1. Shortness of breath Check IC  2. Benign essential HTN (Primary) Check Labs - Comprehensive metabolic panel  3. Vitamin D deficiency Check Labs - VITAMIN D 25 Hydroxy (Vit-D Deficiency, Fractures) - Vitamin B12 MyChart pt after Vit D Level results and refill Ergocalciferol as appropriate  4. Insulin resistance Check Labs - Hemoglobin A1c - Insulin, random  5. Other hyperlipidemia Check Labs - Lipid panel  6. Other depression Refill buPROPion (WELLBUTRIN SR) 150 MG 12 hr tablet Take 1 tablet (150 mg total) by mouth daily. Dispense: 90 tablet, Refills: 0 ordered   7. Obesity,current BMI 26.94  Stacey Singh is currently in the  action stage of change. As such, her goal is to continue with weight loss efforts. She has agreed to the Category 2 Plan.   Exercise goals: For substantial health benefits, adults should do at least 150 minutes (2 hours and 30 minutes) a week of moderate-intensity, or 75 minutes (1 hour and 15 minutes) a week of vigorous-intensity aerobic physical activity, or an equivalent combination of moderate- and vigorous-intensity aerobic activity. Aerobic activity should be performed in episodes of at least 10 minutes, and preferably, it should be spread throughout the week.  Behavioral modification strategies: increasing lean protein intake, decreasing simple carbohydrates, increasing vegetables, increasing water intake, no skipping meals, meal planning and cooking strategies, keeping healthy foods in the home, ways to avoid boredom eating, and planning for success.  Stacey Singh has agreed to follow-up with our clinic in 6 weeks. She was informed of the importance of frequent follow-up visits to maximize her success with intensive lifestyle modifications for her multiple health conditions.   Stacey Singh was informed we would discuss her lab results at her next visit unless there is a critical issue that needs to be addressed sooner. Stacey Singh agreed to keep her next visit at the agreed upon time to discuss these results.  Objective:   Blood pressure 125/84, pulse 87, temperature 97.9 F (36.6 C), height 5\' 4"  (1.626 m), weight 151 lb (68.5 kg), last menstrual period 09/22/2023, SpO2 99%. Body mass index is 25.92 kg/m.  General: Cooperative, alert, well developed, in no acute distress. HEENT: Conjunctivae and lids unremarkable. Cardiovascular: Regular rhythm.  Lungs: Normal work of breathing. Neurologic: No focal deficits.   Lab Results  Component Value Date   CREATININE 0.61 05/26/2023   BUN 8 05/26/2023   NA 142 05/26/2023   K 4.5 05/26/2023   CL 108 (H) 05/26/2023   CO2 25 05/26/2023   Lab Results   Component Value Date   ALT 6 05/26/2023   AST 13 05/26/2023   ALKPHOS 59 05/26/2023   BILITOT <0.2 05/26/2023   Lab Results  Component Value Date   HGBA1C 5.4 05/26/2023   HGBA1C 5.3 01/28/2022   HGBA1C 5.5 08/12/2021   HGBA1C 5.5 12/29/2017   Lab Results  Component Value Date   INSULIN 4.3 05/26/2023   INSULIN 6.7 01/28/2022   INSULIN 10.1 08/12/2021   Lab Results  Component Value Date   TSH 4.320 07/28/2022   Lab Results  Component Value Date   CHOL 182 05/26/2023   HDL 52 05/26/2023   LDLCALC 120 (H) 05/26/2023   TRIG 53 05/26/2023   CHOLHDL 3.5 05/26/2023   Lab Results  Component Value Date   VD25OH 32.7 05/26/2023   VD25OH 66.1 01/11/2023   VD25OH 54.4 01/28/2022   Lab Results  Component Value Date   WBC 10.0 05/20/2015   HGB 10.7 (L) 05/20/2015  HCT 32.0 (L) 05/20/2015   MCV 92.2 05/20/2015   PLT 179 05/20/2015   No results found for: "IRON", "TIBC", "FERRITIN"  Attestation Statements:   Reviewed by clinician on day of visit: allergies, medications, problem list, medical history, surgical history, family history, social history, and previous encounter notes.  I have reviewed the above documentation for accuracy and completeness, and I agree with the above. -  Kasiah Manka d. Yarel Kilcrease, NP-C

## 2023-09-30 ENCOUNTER — Encounter (INDEPENDENT_AMBULATORY_CARE_PROVIDER_SITE_OTHER): Payer: Self-pay | Admitting: Adult Health

## 2023-09-30 ENCOUNTER — Other Ambulatory Visit (INDEPENDENT_AMBULATORY_CARE_PROVIDER_SITE_OTHER): Payer: Self-pay | Admitting: Adult Health

## 2023-09-30 MED ORDER — VITAMIN D (ERGOCALCIFEROL) 1.25 MG (50000 UNIT) PO CAPS: 50000.0000 [IU] | ORAL_CAPSULE | ORAL | 0 refills | Status: DC

## 2023-10-01 LAB — LIPID PANEL
Chol/HDL Ratio: 3.4 ratio (ref 0.0–4.4)
Cholesterol, Total: 214 mg/dL — ABNORMAL HIGH (ref 100–199)
HDL: 63 mg/dL (ref >39)
LDL Chol Calc (NIH): 141 mg/dL — ABNORMAL HIGH (ref 0–99)
Triglycerides: 55 mg/dL (ref 0–149)
VLDL Cholesterol Cal: 10 mg/dL (ref 5–40)

## 2023-10-01 LAB — COMPREHENSIVE METABOLIC PANEL
ALT: 10 IU/L (ref 0–32)
AST: 14 IU/L (ref 0–40)
Albumin: 4 g/dL (ref 3.9–4.9)
Alkaline Phosphatase: 58 IU/L (ref 44–121)
BUN/Creatinine Ratio: 10 (ref 9–23)
BUN: 8 mg/dL (ref 6–24)
Bilirubin Total: 0.4 mg/dL (ref 0.0–1.2)
CO2: 20 mmol/L (ref 20–29)
Calcium: 8.9 mg/dL (ref 8.7–10.2)
Chloride: 106 mmol/L (ref 96–106)
Creatinine, Ser: 0.77 mg/dL (ref 0.57–1.00)
Globulin, Total: 2.4 g/dL (ref 1.5–4.5)
Glucose: 78 mg/dL (ref 70–99)
Potassium: 3.7 mmol/L (ref 3.5–5.2)
Sodium: 143 mmol/L (ref 134–144)
Total Protein: 6.4 g/dL (ref 6.0–8.5)
eGFR: 96 mL/min/{1.73_m2} (ref >59)

## 2023-10-01 LAB — HEMOGLOBIN A1C
Est. average glucose Bld gHb Est-mCnc: 97 mg/dL
Hgb A1c MFr Bld: 5 % (ref 4.8–5.6)

## 2023-10-01 LAB — VITAMIN D 25 HYDROXY (VIT D DEFICIENCY, FRACTURES): Vit D, 25-Hydroxy: 49.8 ng/mL (ref 30.0–100.0)

## 2023-10-01 LAB — VITAMIN B12: Vitamin B-12: 341 pg/mL (ref 232–1245)

## 2023-10-01 LAB — INSULIN, RANDOM: INSULIN: 5.8 u[IU]/mL (ref 2.6–24.9)

## 2023-11-09 ENCOUNTER — Ambulatory Visit (INDEPENDENT_AMBULATORY_CARE_PROVIDER_SITE_OTHER): Admitting: Adult Health

## 2023-11-09 ENCOUNTER — Encounter (INDEPENDENT_AMBULATORY_CARE_PROVIDER_SITE_OTHER): Payer: Self-pay | Admitting: Adult Health

## 2023-11-09 VITALS — BP 134/85 | HR 89 | Temp 98.6°F | Ht 64.0 in | Wt 152.0 lb

## 2023-11-09 DIAGNOSIS — E669 Obesity, unspecified: Secondary | ICD-10-CM

## 2023-11-09 DIAGNOSIS — F3289 Other specified depressive episodes: Secondary | ICD-10-CM | POA: Diagnosis not present

## 2023-11-09 DIAGNOSIS — E7849 Other hyperlipidemia: Secondary | ICD-10-CM

## 2023-11-09 DIAGNOSIS — E88819 Insulin resistance, unspecified: Secondary | ICD-10-CM | POA: Diagnosis not present

## 2023-11-09 DIAGNOSIS — I10 Essential (primary) hypertension: Secondary | ICD-10-CM

## 2023-11-09 DIAGNOSIS — E559 Vitamin D deficiency, unspecified: Secondary | ICD-10-CM | POA: Diagnosis not present

## 2023-11-09 DIAGNOSIS — Z6826 Body mass index (BMI) 26.0-26.9, adult: Secondary | ICD-10-CM

## 2023-11-09 DIAGNOSIS — E66813 Obesity, class 3: Secondary | ICD-10-CM

## 2023-11-09 MED ORDER — VITAMIN D (ERGOCALCIFEROL) 1.25 MG (50000 UNIT) PO CAPS: 50000.0000 [IU] | ORAL_CAPSULE | ORAL | 0 refills | Status: DC

## 2023-11-09 MED ORDER — BUPROPION HCL ER (SR) 150 MG PO TB12: 150.0000 mg | ORAL_TABLET | Freq: Every day | ORAL | 0 refills | Status: DC

## 2023-11-09 NOTE — Progress Notes (Signed)
 WEIGHT SUMMARY AND BIOMETRICS  Vitals Temp: 98.6 F (37 C) BP: 134/85 Pulse Rate: 89 SpO2: 100 %   Anthropometric Measurements Height: 5\' 4"  (1.626 m) Weight: 152 lb (68.9 kg) BMI (Calculated): 26.08 Weight at Last Visit: 151 lb Weight Lost Since Last Visit: 0 Weight Gained Since Last Visit: 1 lb Starting Weight: 236 lb Total Weight Loss (lbs): 84 lb (38.1 kg)   Body Composition  Body Fat %: 34.4 % Fat Mass (lbs): 52.4 lbs Muscle Mass (lbs): 95 lbs Total Body Water (lbs): 68.4 lbs Visceral Fat Rating : 6   Other Clinical Data RMR: 1354 Fasting: no Labs: no Today's Visit #: 37 Starting Date: 08/02/21    Chief Complaint:   OBESITY Stacey Singh is here to discuss her progress with her obesity treatment plan.  She is on the the Category 2 Plan and states she is following her eating plan approximately 85 % of the time.  She states she is exercising Conservation [employer], [employer] 30/90 minutes 4/3 times per week.   Interim History:  She is on bi-weekly Wegovy 1.7mg - via her mother-in-law Denies mass in neck, dysphagia, dyspepsia, persistent hoarseness, abdominal pain, or N/V/C   Reviewed Bioimpedance results with pt: Muscle Mass: +1.2 lbs Adipose Mass: -0.2 lb  Stacey Singh has added in core strengthening several times per week Her Visceral Adipose rating is 6, highest recorded at 14  Subjective:   1. Benign essential HTN Discussed Labs BP slightly elevated at OV She is not currently on antihypertensive therapy 09/29/2023 CMP: Electrolytes, liver enzymes, and Kidney fx- all normal  2. Insulin resistance Discussed Labs  Latest Reference Range & Units 09/29/23 10:02  Glucose 70 - 99 mg/dL 78  Hemoglobin G4W 4.8 - 5.6 % 5.0  Est. average glucose Bld gHb Est-mCnc mg/dL 97  INSULIN 2.6 - 10.2 uIU/mL 5.8   Vitamin B12 232 - 1,245 pg/mL 341   CBG, A1c, and Insulin levels all at goal B12 low normal, below goal of 500 She is not on any oral B12  supplementation  3. Vitamin D deficiency Discussed Labs  Latest Reference Range & Units 09/29/23 10:02  Vitamin D, 25-Hydroxy 30.0 - 100.0 ng/mL 49.8       Vit D level stable and at goal She is on weekly Ergocalciferol- denies N/V/Muscle Weakness 4. Other hyperlipidemia Discussed Labs Lipid Panel     Component Value Date/Time   CHOL 214 (H) 09/29/2023 1002   TRIG 55 09/29/2023 1002   HDL 63 09/29/2023 1002   CHOLHDL 3.4 09/29/2023 1002   LDLCALC 141 (H) 09/29/2023 1002   LABVLDL 10 09/29/2023 1002   The 10-year ASCVD risk score (Arnett DK, et al., 2019) is: 1%   Values used to calculate the score:     Age: 48 years     Sex: Female     Is Non-Hispanic African American: No     Diabetic: No     Tobacco smoker: No     Systolic Blood Pressure: 134 mmHg     Is BP treated: No     HDL Cholesterol: 63 mg/dL     Total Cholesterol: 214 mg/dL   Total and LDL both worsened and above goal ASCVD Risk Stratification acceptable She is not on statin therapy  5. Other depression Discussed Labs She reports taking daily Wellbutrin SR 150mg  early each morning. She denies insomnia with Wellbutrin use She reports stable mood, denies SI/HI  Assessment/Plan:   1. Benign essential HTN Limit Na+ Continue regular cardiovascular  exercise Monitor BP  2. Insulin resistance (Primary) Continue prescribed Cat 2 MP Remain active Start OTC daily MVI , to improve B12 level  3. Vitamin D deficiency Refill Vitamin D, Ergocalciferol, (DRISDOL) 1.25 MG (50000 UNIT) CAPS capsule Take 1 capsule (50,000 Units total) by mouth every 7 (seven) days. Dispense: 8 capsule, Refills: 0 ordered   Start OTC daily MVI , to improve B12 level  4. Other hyperlipidemia Reduce sat fat intake Remain active Monitor Labs  5. Other depression Refill buPROPion (WELLBUTRIN SR) 150 MG 12 hr tablet Take 1 tablet (150 mg total) by mouth daily. Dispense: 90 tablet, Refills: 0 ordered   6. Obesity,current BMI  26.2  Stacey Singh is currently in the action stage of change. As such, her goal is to maintain weight for now. She has agreed to the Category 2 Plan.   Exercise goals: For substantial health benefits, adults should do at least 150 minutes (2 hours and 30 minutes) a week of moderate-intensity, or 75 minutes (1 hour and 15 minutes) a week of vigorous-intensity aerobic physical activity, or an equivalent combination of moderate- and vigorous-intensity aerobic activity. Aerobic activity should be performed in episodes of at least 10 minutes, and preferably, it should be spread throughout the week.  Behavioral modification strategies: increasing lean protein intake, decreasing simple carbohydrates, increasing vegetables, increasing water intake, no skipping meals, meal planning and cooking strategies, keeping healthy foods in the home, ways to avoid boredom eating, and planning for success.  Stacey Singh has agreed to follow-up with our clinic in 6 weeks. She was informed of the importance of frequent follow-up visits to maximize her success with intensive lifestyle modifications for her multiple health conditions.   Objective:   Blood pressure 134/85, pulse 89, temperature 98.6 F (37 C), height 5\' 4"  (1.626 m), weight 152 lb (68.9 kg), last menstrual period 10/13/2023, SpO2 100%. Body mass index is 26.09 kg/m.  General: Cooperative, alert, well developed, in no acute distress. HEENT: Conjunctivae and lids unremarkable. Cardiovascular: Regular rhythm.  Lungs: Normal work of breathing. Neurologic: No focal deficits.   Lab Results  Component Value Date   CREATININE 0.77 09/29/2023   BUN 8 09/29/2023   NA 143 09/29/2023   K 3.7 09/29/2023   CL 106 09/29/2023   CO2 20 09/29/2023   Lab Results  Component Value Date   ALT 10 09/29/2023   AST 14 09/29/2023   ALKPHOS 58 09/29/2023   BILITOT 0.4 09/29/2023   Lab Results  Component Value Date   HGBA1C 5.0 09/29/2023   HGBA1C 5.4 05/26/2023   HGBA1C  5.3 01/28/2022   HGBA1C 5.5 08/12/2021   HGBA1C 5.5 12/29/2017   Lab Results  Component Value Date   INSULIN 5.8 09/29/2023   INSULIN 4.3 05/26/2023   INSULIN 6.7 01/28/2022   INSULIN 10.1 08/12/2021   Lab Results  Component Value Date   TSH 4.320 07/28/2022   Lab Results  Component Value Date   CHOL 214 (H) 09/29/2023   HDL 63 09/29/2023   LDLCALC 141 (H) 09/29/2023   TRIG 55 09/29/2023   CHOLHDL 3.4 09/29/2023   Lab Results  Component Value Date   VD25OH 49.8 09/29/2023   VD25OH 32.7 05/26/2023   VD25OH 66.1 01/11/2023   Lab Results  Component Value Date   WBC 10.0 05/20/2015   HGB 10.7 (L) 05/20/2015   HCT 32.0 (L) 05/20/2015   MCV 92.2 05/20/2015   PLT 179 05/20/2015   No results found for: "IRON", "TIBC", "FERRITIN"  Attestation Statements:  Reviewed by clinician on day of visit: allergies, medications, problem list, medical history, surgical history, family history, social history, and previous encounter notes.  I have reviewed the above documentation for accuracy and completeness, and I agree with the above. -  Lalani Winkles d. Jermesha Sottile, NP-C

## 2023-11-10 ENCOUNTER — Ambulatory Visit (INDEPENDENT_AMBULATORY_CARE_PROVIDER_SITE_OTHER): Admitting: Adult Health

## 2023-12-22 ENCOUNTER — Encounter (INDEPENDENT_AMBULATORY_CARE_PROVIDER_SITE_OTHER): Payer: Self-pay | Admitting: Adult Health

## 2023-12-22 ENCOUNTER — Ambulatory Visit (INDEPENDENT_AMBULATORY_CARE_PROVIDER_SITE_OTHER): Admitting: Adult Health

## 2023-12-22 VITALS — BP 117/72 | HR 78 | Temp 98.3°F | Ht 64.0 in | Wt 152.0 lb

## 2023-12-22 DIAGNOSIS — Z6826 Body mass index (BMI) 26.0-26.9, adult: Secondary | ICD-10-CM

## 2023-12-22 DIAGNOSIS — E66813 Obesity, class 3: Secondary | ICD-10-CM

## 2023-12-22 DIAGNOSIS — R632 Polyphagia: Secondary | ICD-10-CM

## 2023-12-22 DIAGNOSIS — F3289 Other specified depressive episodes: Secondary | ICD-10-CM

## 2023-12-22 DIAGNOSIS — E88819 Insulin resistance, unspecified: Secondary | ICD-10-CM

## 2023-12-22 DIAGNOSIS — E559 Vitamin D deficiency, unspecified: Secondary | ICD-10-CM | POA: Diagnosis not present

## 2023-12-22 DIAGNOSIS — E669 Obesity, unspecified: Secondary | ICD-10-CM

## 2023-12-22 DIAGNOSIS — I10 Essential (primary) hypertension: Secondary | ICD-10-CM

## 2023-12-22 MED ORDER — BUPROPION HCL ER (SR) 150 MG PO TB12: 150.0000 mg | ORAL_TABLET | Freq: Every day | ORAL | 0 refills | Status: DC

## 2023-12-22 MED ORDER — VITAMIN D (ERGOCALCIFEROL) 1.25 MG (50000 UNIT) PO CAPS: 50000.0000 [IU] | ORAL_CAPSULE | ORAL | 0 refills | Status: DC

## 2023-12-22 NOTE — Progress Notes (Signed)
 WEIGHT SUMMARY AND BIOMETRICS  Vitals Temp: 98.3 F (36.8 C) BP: 117/72 Pulse Rate: 78 SpO2: 99 %   Anthropometric Measurements Height: 5\' 4"  (1.626 m) Weight: 152 lb (68.9 kg) BMI (Calculated): 26.08 Weight at Last Visit: 152 lb Weight Lost Since Last Visit: 0 Weight Gained Since Last Visit: 0 Starting Weight: 236 lb Total Weight Loss (lbs): 84 lb (38.1 kg)   Body Composition  Body Fat %: 32.9 % Fat Mass (lbs): 50.2 lbs Muscle Mass (lbs): 97.4 lbs Total Body Water (lbs): 67 lbs Visceral Fat Rating : 6   Other Clinical Data RMR: 1354 Fasting: yes Labs: no Today's Visit #: 38 Starting Date: 01/30/22    Chief Complaint:   OBESITY Stacey Singh is here to discuss her progress with her obesity treatment plan.  She is on the the Category 2 Plan and states she is following her eating plan approximately 80 % of the time.  She states she is exercising Walking 30 minutes 3 times per week.   Interim History:  Reviewed Bioimpedance Results with pt: Muscle Mass: +2.4 lbs Adipose Mass: -2.2 lbs  She is on bi-weekly Wegovy  1.7mg - via her mother-in-law Denies mass in neck, dysphagia, dyspepsia, persistent hoarseness, abdominal pain, or N/V/C   Mother in law received letter stating coverage for Wegovy  will cease December 26, 2023 Discussed [employer] Option as an alternative  Subjective:   1. Insulin  resistance She is on bi-weekly Wegovy  1.7mg - via her mother-in-law Denies mass in neck, dysphagia, dyspepsia, persistent hoarseness, abdominal pain, or N/V/C   2. Vitamin D  deficiency She is on weekly Ergocalciferol - denies N/V/Muscle Weakness  3. Benign essential HTN BP at goal at OV She denies tobacco/vape use She is not currently on antihypertensive therapy  4. Other depression She endorses stable mood She denies SI/HI BP excellent at OV She is on daily Wellbutrin  SR 150mg   Assessment/Plan:   1. Insulin  resistance (Primary) Continue healthy eating and  regular exercise  2. Vitamin D  deficiency Refill Vitamin D , Ergocalciferol , (DRISDOL ) 1.25 MG (50000 UNIT) CAPS capsule Take 1 capsule (50,000 Units total) by mouth every 7 (seven) days. Dispense: 8 capsule, Refills: 0 ordered   3. Benign essential HTN Continue healthy eating and regular exercise  4. Other depression Refill buPROPion  (WELLBUTRIN  SR) 150 MG 12 hr tablet Take 1 tablet (150 mg total) by mouth daily. Dispense: 90 tablet, Refills: 0 ordered   5. Obesity,current BMI 26.2  Stacey Singh is currently in the action stage of change. As such, her goal is to maintain weight for now. She has agreed to the Category 2 Plan.   Exercise goals: For substantial health benefits, adults should do at least 150 minutes (2 hours and 30 minutes) a week of moderate-intensity, or 75 minutes (1 hour and 15 minutes) a week of vigorous-intensity aerobic physical activity, or an equivalent combination of moderate- and vigorous-intensity aerobic activity. Aerobic activity should be performed in episodes of at least 10 minutes, and preferably, it should be spread throughout the week.  Behavioral modification strategies: increasing lean protein intake, decreasing simple carbohydrates, increasing vegetables, increasing water intake, no skipping meals, meal planning and cooking strategies, keeping healthy foods in the home, and planning for success.  Stacey Singh has agreed to follow-up with our clinic in 4 weeks. She was informed of the importance of frequent follow-up visits to maximize her success with intensive lifestyle modifications for her multiple health conditions.   Objective:   Blood pressure 117/72, pulse 78, temperature 98.3 F (36.8 C), height  5\' 4"  (1.626 m), weight 152 lb (68.9 kg), last menstrual period 12/17/2023, SpO2 99%. Body mass index is 26.09 kg/m.  General: Cooperative, alert, well developed, in no acute distress. HEENT: Conjunctivae and lids unremarkable. Cardiovascular: Regular rhythm.   Lungs: Normal work of breathing. Neurologic: No focal deficits.   Lab Results  Component Value Date   CREATININE 0.77 09/29/2023   BUN 8 09/29/2023   NA 143 09/29/2023   K 3.7 09/29/2023   CL 106 09/29/2023   CO2 20 09/29/2023   Lab Results  Component Value Date   ALT 10 09/29/2023   AST 14 09/29/2023   ALKPHOS 58 09/29/2023   BILITOT 0.4 09/29/2023   Lab Results  Component Value Date   HGBA1C 5.0 09/29/2023   HGBA1C 5.4 05/26/2023   HGBA1C 5.3 01/28/2022   HGBA1C 5.5 08/12/2021   HGBA1C 5.5 12/29/2017   Lab Results  Component Value Date   INSULIN  5.8 09/29/2023   INSULIN  4.3 05/26/2023   INSULIN  6.7 01/28/2022   INSULIN  10.1 08/12/2021   Lab Results  Component Value Date   TSH 4.320 07/28/2022   Lab Results  Component Value Date   CHOL 214 (H) 09/29/2023   HDL 63 09/29/2023   LDLCALC 141 (H) 09/29/2023   TRIG 55 09/29/2023   CHOLHDL 3.4 09/29/2023   Lab Results  Component Value Date   VD25OH 49.8 09/29/2023   VD25OH 32.7 05/26/2023   VD25OH 66.1 01/11/2023   Lab Results  Component Value Date   WBC 10.0 05/20/2015   HGB 10.7 (L) 05/20/2015   HCT 32.0 (L) 05/20/2015   MCV 92.2 05/20/2015   PLT 179 05/20/2015   No results found for: "IRON", "TIBC", "FERRITIN"  Attestation Statements:   Reviewed by clinician on day of visit: allergies, medications, problem list, medical history, surgical history, family history, social history, and previous encounter notes.  I have reviewed the above documentation for accuracy and completeness, and I agree with the above. -  Kye Silverstein d. [name], NP-C

## 2024-02-07 ENCOUNTER — Ambulatory Visit (INDEPENDENT_AMBULATORY_CARE_PROVIDER_SITE_OTHER): Admitting: Adult Health

## 2024-02-08 ENCOUNTER — Encounter (INDEPENDENT_AMBULATORY_CARE_PROVIDER_SITE_OTHER): Payer: Self-pay | Admitting: Adult Health

## 2024-02-08 ENCOUNTER — Ambulatory Visit (INDEPENDENT_AMBULATORY_CARE_PROVIDER_SITE_OTHER): Admitting: Adult Health

## 2024-02-08 VITALS — BP 102/64 | HR 79 | Temp 98.4°F | Ht 64.0 in | Wt 153.0 lb

## 2024-02-08 DIAGNOSIS — Z6841 Body Mass Index (BMI) 40.0 and over, adult: Secondary | ICD-10-CM

## 2024-02-08 DIAGNOSIS — F5089 Other specified eating disorder: Secondary | ICD-10-CM | POA: Diagnosis not present

## 2024-02-08 DIAGNOSIS — F3289 Other specified depressive episodes: Secondary | ICD-10-CM | POA: Diagnosis not present

## 2024-02-08 DIAGNOSIS — I10 Essential (primary) hypertension: Secondary | ICD-10-CM | POA: Diagnosis not present

## 2024-02-08 DIAGNOSIS — E88819 Insulin resistance, unspecified: Secondary | ICD-10-CM | POA: Diagnosis not present

## 2024-02-08 DIAGNOSIS — Z6826 Body mass index (BMI) 26.0-26.9, adult: Secondary | ICD-10-CM

## 2024-02-08 DIAGNOSIS — E669 Obesity, unspecified: Secondary | ICD-10-CM

## 2024-02-08 DIAGNOSIS — E559 Vitamin D deficiency, unspecified: Secondary | ICD-10-CM

## 2024-02-08 MED ORDER — BUPROPION HCL ER (SR) 150 MG PO TB12: 150.0000 mg | ORAL_TABLET | Freq: Every day | ORAL | 0 refills | Status: DC

## 2024-02-08 NOTE — Progress Notes (Signed)
 WEIGHT SUMMARY AND BIOMETRICS  Vitals Temp: 98.4 F (36.9 C) BP: 102/64 Pulse Rate: 79 SpO2: 100 %   Anthropometric Measurements Height: 5' 4 (1.626 m) Weight: 153 lb (69.4 kg) BMI (Calculated): 26.25 Weight at Last Visit: 152 lb Weight Lost Since Last Visit: 0 Weight Gained Since Last Visit: 1 lb Starting Weight: 236 lb Total Weight Loss (lbs): 83 lb (37.6 kg)   Body Composition  Body Fat %: 33.7 % Fat Mass (lbs): 51.6 lbs Muscle Mass (lbs): 96.4 lbs Total Body Water (lbs): 68.8 lbs Visceral Fat Rating : 6   Other Clinical Data RMR: 1354 Fasting: yes Labs: no Today's Visit #: 39 Starting Date: 01/30/22    Chief Complaint:   OBESITY Stacey Singh is here to discuss her progress with her obesity treatment plan. She is on the the Category 2 Plan and states she is following her eating plan approximately 85-90 % of the time.  She states she is exercising Walking 34-45 minutes 4 times per week.  Interim History:  She has been administering Wegovy  1.7 Q14 days She has 8 injections at home- supply provided by her Mother in Law Denies mass in neck, dysphagia, dyspepsia, persistent hoarseness, abdominal pain, or N/V/C   Stress-  She denies SI/HI She endorses increased sadness due to her 48 year old winrhiner Stacey Singh that was put to sleep yesterday. She has a strong support system of family/friends and they have another dog at home, Stacey Singh (Labra doodle).  Subjective:   1. Insulin  resistance  Latest Reference Range & Units 08/12/21 10:01 01/28/22 12:38 05/26/23 09:13 09/29/23 10:02  INSULIN  2.6 - 24.9 uIU/mL 10.1 6.7 4.3 5.8   She has been administering Wegovy  1.7 Q14 days She has 8 injections at home- supply provided by her Mother in [name] Denies mass in neck, dysphagia, dyspepsia, persistent hoarseness, abdominal pain, or N/V/C   2. Benign essential HTN BP excellent at OV She denise CP with exertion She is on GLP-1 therapy bi-weekly Wegovy  1.7mg  She is  not on antihypertensive therapy  3. Vitamin D  deficiency  Latest Reference Range & Units 01/11/23 10:19 05/26/23 09:13 09/29/23 10:02  Vitamin D , 25-Hydroxy 30.0 - 100.0 ng/mL 66.1 32.7 49.8   She is on weekly Ergocalciferol - denies N/V/Muscle Weakness  4. Other depression with emotional eating She denies SI/HI She endorses increased sadness due to her 48 year old winrhiner Stacey Singh that was put to sleep yesterday. She has a strong support system of family/friends and they have another dog at home, Stacey Singh (Labra doodle). BP/HR at goal She is on morning Wellbutrin  SR 150mg   Assessment/Plan:   1. Insulin  resistance Continue healthy eating and regular walking Continue bi-weekly GLP-1 therapy  2. Benign essential HTN Continue healthy eating and regular walking Continue bi-weekly GLP-1 therapy  3. Vitamin D  deficiency Continue weekly Ergocalciferol - denies need for refill today  4. Other depression with emotional eating Refill - buPROPion  (WELLBUTRIN  SR) 150 MG 12 hr tablet; Take 1 tablet (150 mg total) by mouth daily.  Dispense: 90 tablet; Refill: 0  5. Obesity,current BMI 26.3  Stacey Singh is currently in the action stage of change. As such, her goal is to continue with weight loss efforts. She has agreed to the Category 2 Plan.   Exercise goals: For substantial health benefits, adults should do at least 150 minutes (2 hours and 30 minutes) a week of moderate-intensity, or 75 minutes (1 hour and 15 minutes) a week of vigorous-intensity aerobic physical activity, or an equivalent combination of moderate-  and vigorous-intensity aerobic activity. Aerobic activity should be performed in episodes of at least 10 minutes, and preferably, it should be spread throughout the week.  Behavioral modification strategies: increasing lean protein intake, decreasing simple carbohydrates, increasing vegetables, increasing water intake, no skipping meals, meal planning and cooking strategies, keeping  healthy foods in the home, ways to avoid boredom eating, and planning for success.  Stacey Singh has agreed to follow-up with our clinic in 4 weeks. She was informed of the importance of frequent follow-up visits to maximize her success with intensive lifestyle modifications for her multiple health conditions.   Check Fasting Labs at next OV  Objective:   Blood pressure 102/64, pulse 79, temperature 98.4 F (36.9 C), height 5' 4 (1.626 m), weight 153 lb (69.4 kg), SpO2 100%. Body mass index is 26.26 kg/m.  General: Cooperative, alert, well developed, in no acute distress. HEENT: Conjunctivae and lids unremarkable. Cardiovascular: Regular rhythm.  Lungs: Normal work of breathing. Neurologic: No focal deficits.   Lab Results  Component Value Date   CREATININE 0.77 09/29/2023   BUN 8 09/29/2023   NA 143 09/29/2023   K 3.7 09/29/2023   CL 106 09/29/2023   CO2 20 09/29/2023   Lab Results  Component Value Date   ALT 10 09/29/2023   AST 14 09/29/2023   ALKPHOS 58 09/29/2023   BILITOT 0.4 09/29/2023   Lab Results  Component Value Date   HGBA1C 5.0 09/29/2023   HGBA1C 5.4 05/26/2023   HGBA1C 5.3 01/28/2022   HGBA1C 5.5 08/12/2021   HGBA1C 5.5 12/29/2017   Lab Results  Component Value Date   INSULIN  5.8 09/29/2023   INSULIN  4.3 05/26/2023   INSULIN  6.7 01/28/2022   INSULIN  10.1 08/12/2021   Lab Results  Component Value Date   TSH 4.320 07/28/2022   Lab Results  Component Value Date   CHOL 214 (H) 09/29/2023   HDL 63 09/29/2023   LDLCALC 141 (H) 09/29/2023   TRIG 55 09/29/2023   CHOLHDL 3.4 09/29/2023   Lab Results  Component Value Date   VD25OH 49.8 09/29/2023   VD25OH 32.7 05/26/2023   VD25OH 66.1 01/11/2023   Lab Results  Component Value Date   WBC 10.0 05/20/2015   HGB 10.7 (L) 05/20/2015   HCT 32.0 (L) 05/20/2015   MCV 92.2 05/20/2015   PLT 179 05/20/2015   No results found for: IRON, TIBC, FERRITIN  Attestation Statements:   Reviewed by  clinician on day of visit: allergies, medications, problem list, medical history, surgical history, family history, social history, and previous encounter notes.  I have reviewed the above documentation for accuracy and completeness, and I agree with the above. -  Wally Shevchenko d. Chanah Tidmore, NP-C

## 2024-03-21 ENCOUNTER — Encounter (INDEPENDENT_AMBULATORY_CARE_PROVIDER_SITE_OTHER): Payer: Self-pay | Admitting: Adult Health

## 2024-03-21 ENCOUNTER — Ambulatory Visit (INDEPENDENT_AMBULATORY_CARE_PROVIDER_SITE_OTHER): Admitting: Adult Health

## 2024-03-21 VITALS — BP 120/75 | HR 69 | Temp 97.7°F | Ht 64.0 in | Wt 158.0 lb

## 2024-03-21 DIAGNOSIS — R632 Polyphagia: Secondary | ICD-10-CM

## 2024-03-21 DIAGNOSIS — E559 Vitamin D deficiency, unspecified: Secondary | ICD-10-CM | POA: Diagnosis not present

## 2024-03-21 DIAGNOSIS — I10 Essential (primary) hypertension: Secondary | ICD-10-CM

## 2024-03-21 DIAGNOSIS — E88819 Insulin resistance, unspecified: Secondary | ICD-10-CM

## 2024-03-21 DIAGNOSIS — E782 Mixed hyperlipidemia: Secondary | ICD-10-CM

## 2024-03-21 DIAGNOSIS — E669 Obesity, unspecified: Secondary | ICD-10-CM

## 2024-03-21 DIAGNOSIS — Z6841 Body Mass Index (BMI) 40.0 and over, adult: Secondary | ICD-10-CM

## 2024-03-21 DIAGNOSIS — F3289 Other specified depressive episodes: Secondary | ICD-10-CM

## 2024-03-21 DIAGNOSIS — Z6827 Body mass index (BMI) 27.0-27.9, adult: Secondary | ICD-10-CM

## 2024-03-21 MED ORDER — BUPROPION HCL ER (SR) 150 MG PO TB12: 150.0000 mg | ORAL_TABLET | Freq: Every day | ORAL | 0 refills | Status: DC

## 2024-03-21 NOTE — Progress Notes (Signed)
 WEIGHT SUMMARY AND BIOMETRICS  Vitals Temp: 97.7 F (36.5 C) BP: 120/75 Pulse Rate: 69 SpO2: 100 %   Anthropometric Measurements Height: 5' 4 (1.626 m) Weight: 158 lb (71.7 kg) BMI (Calculated): 27.11 Weight at Last Visit: 153 lb Weight Lost Since Last Visit: 0 Weight Gained Since Last Visit: 5 lb Starting Weight: 236 lb Total Weight Loss (lbs): 78 lb (35.4 kg)   Body Composition  Body Fat %: 35.1 % Fat Mass (lbs): 55.6 lbs Muscle Mass (lbs): 97.4 lbs Total Body Water (lbs): 69.2 lbs Visceral Fat Rating : 7   Other Clinical Data RMR: 1354 Fasting: yes Labs: yes Today's Visit #: 40 Starting Date: 01/30/22    Chief Complaint:   OBESITY Stacey Singh is here to discuss her progress with her obesity treatment plan.  She is on the the Category 2 Plan and states she is following her eating plan approximately 75 % of the time.  She states she is exercising Walking 30-45 minutes 3-4 times per week.  Interim History:  Reviewed Bioempidence Results with pt: Muscle Mass: +1 lb Adipose Mass: + 4 lbs  She reports relaxed eating and less regular exercise at end of the summer- she is not terribly surprised to have gained weight  She has been administering Wegovy  1.7 Q14 days She has 8 injections at home- supply provided by her Mother in Law Denies mass in neck, dysphagia, dyspepsia, persistent hoarseness, abdominal pain, or N/V/C   Hunger/appetite-stable appetite  Subjective:   1. Insulin  resistance  Latest Reference Range & Units 01/28/22 12:38 05/26/23 09:13 09/29/23 10:02  INSULIN  2.6 - 24.9 uIU/mL 6.7 4.3 5.8   She has been administering Wegovy  1.7 Q14 days She has 8 injections at home- supply provided by her Mother in [place] Denies mass in neck, dysphagia, dyspepsia, persistent hoarseness, abdominal pain, or N/V/C   2. Benign essential HTN BP at goal  3. Vitamin D  deficiency  Latest Reference Range & Units 01/11/23 10:19 05/26/23 09:13 09/29/23 10:02   Vitamin D , 25-Hydroxy 30.0 - 100.0 ng/mL 66.1 32.7 49.8    4. Mixed hyperlipidemia Lipid Panel     Component Value Date/Time   CHOL 214 (H) 09/29/2023 1002   TRIG 55 09/29/2023 1002   HDL 63 09/29/2023 1002   CHOLHDL 3.4 09/29/2023 1002   LDLCALC 141 (H) 09/29/2023 1002   LABVLDL 10 09/29/2023 1002   The 10-year ASCVD risk score (Arnett DK, et al., 2019) is: 0.8%   Values used to calculate the score:     Age: 28 years     Clincally relevant sex: Female     Is Non-Hispanic African American: No     Diabetic: No     Tobacco smoker: No     Systolic Blood Pressure: 120 mmHg     Is BP treated: No     HDL Cholesterol: 63 mg/dL     Total Cholesterol: 214 mg/dL   5. Polyphagia She has been administering Wegovy  1.7 Q14 days She has 8 injections at home- supply provided by her Mother in Law Denies mass in neck, dysphagia, dyspepsia, persistent hoarseness, abdominal pain, or N/V/C   6. Other depression with emotional eating She endorses stable mood, denies SI/HI BP at goal at OV She is on morning Wellbutrin  SR 150mg , if she is unsure is she took am dose she will take a dose in afternoon. Discussed the safe max daily dose of Wellbutrin  SR 400, is she takes two doses she is well under the max  daily dose (300mg )  Assessment/Plan:   1. Insulin  resistance (Primary) Check Labs - Hemoglobin A1c - Insulin , random  2. Benign essential HTN Check Labs - Comprehensive metabolic panel with GFR  3. Vitamin D  deficiency Check Labs - VITAMIN D  25 Hydroxy (Vit-D Deficiency, Fractures)  4. Mixed hyperlipidemia Check Labs - Lipid panel  5. Polyphagia Increase protein intake and limit simple CHO/sugar intake  6. Other depression with emotional eating Check Labs - buPROPion  (WELLBUTRIN  SR) 150 MG 12 hr tablet; Take 1 tablet (150 mg total) by mouth daily.  Dispense: 90 tablet; Refill: 0  7. Obesity,current BMI 27.2  Karry is currently in the action stage of change. As such, her goal  is to continue with weight loss efforts. She has agreed to the Category 2 Plan.   Exercise goals: For substantial health benefits, adults should do at least 150 minutes (2 hours and 30 minutes) a week of moderate-intensity, or 75 minutes (1 hour and 15 minutes) a week of vigorous-intensity aerobic physical activity, or an equivalent combination of moderate- and vigorous-intensity aerobic activity. Aerobic activity should be performed in episodes of at least 10 minutes, and preferably, it should be spread throughout the week.  Behavioral modification strategies: increasing lean protein intake, decreasing simple carbohydrates, increasing vegetables, increasing water intake, no skipping meals, meal planning and cooking strategies, keeping healthy foods in the home, ways to avoid boredom eating, and planning for success.  Deanette has agreed to follow-up with our clinic in 4 weeks. She was informed of the importance of frequent follow-up visits to maximize her success with intensive lifestyle modifications for her multiple health conditions.   Kailly was informed we would discuss her lab results at her next visit unless there is a critical issue that needs to be addressed sooner. Aeralyn agreed to keep her next visit at the agreed upon time to discuss these results.  Objective:   Blood pressure 120/75, pulse 69, temperature 97.7 F (36.5 C), height 5' 4 (1.626 m), weight 158 lb (71.7 kg), SpO2 100%. Body mass index is 27.12 kg/m.  General: Cooperative, alert, well developed, in no acute distress. HEENT: Conjunctivae and lids unremarkable. Cardiovascular: Regular rhythm.  Lungs: Normal work of breathing. Neurologic: No focal deficits.   Lab Results  Component Value Date   CREATININE 0.77 09/29/2023   BUN 8 09/29/2023   NA 143 09/29/2023   K 3.7 09/29/2023   CL 106 09/29/2023   CO2 20 09/29/2023   Lab Results  Component Value Date   ALT 10 09/29/2023   AST 14 09/29/2023   ALKPHOS 58  09/29/2023   BILITOT 0.4 09/29/2023   Lab Results  Component Value Date   HGBA1C 5.0 09/29/2023   HGBA1C 5.4 05/26/2023   HGBA1C 5.3 01/28/2022   HGBA1C 5.5 08/12/2021   HGBA1C 5.5 12/29/2017   Lab Results  Component Value Date   INSULIN  5.8 09/29/2023   INSULIN  4.3 05/26/2023   INSULIN  6.7 01/28/2022   INSULIN  10.1 08/12/2021   Lab Results  Component Value Date   TSH 4.320 07/28/2022   Lab Results  Component Value Date   CHOL 214 (H) 09/29/2023   HDL 63 09/29/2023   LDLCALC 141 (H) 09/29/2023   TRIG 55 09/29/2023   CHOLHDL 3.4 09/29/2023   Lab Results  Component Value Date   VD25OH 49.8 09/29/2023   VD25OH 32.7 05/26/2023   VD25OH 66.1 01/11/2023   Lab Results  Component Value Date   WBC 10.0 05/20/2015   HGB 10.7 (L) 05/20/2015  HCT 32.0 (L) 05/20/2015   MCV 92.2 05/20/2015   PLT 179 05/20/2015   No results found for: IRON, TIBC, FERRITIN  Attestation Statements:   Reviewed by clinician on day of visit: allergies, medications, problem list, medical history, surgical history, family history, social history, and previous encounter notes.  I have reviewed the above documentation for accuracy and completeness, and I agree with the above. -  Tykerria Mccubbins d. An Schnabel, NP-C

## 2024-03-23 LAB — COMPREHENSIVE METABOLIC PANEL WITH GFR
ALT: 16 IU/L (ref 0–32)
AST: 18 IU/L (ref 0–40)
Albumin: 4.2 g/dL (ref 3.9–4.9)
Alkaline Phosphatase: 58 IU/L (ref 44–121)
BUN/Creatinine Ratio: 15 (ref 9–23)
BUN: 9 mg/dL (ref 6–24)
Bilirubin Total: 0.4 mg/dL (ref 0.0–1.2)
CO2: 24 mmol/L (ref 20–29)
Calcium: 8.6 mg/dL — ABNORMAL LOW (ref 8.7–10.2)
Chloride: 105 mmol/L (ref 96–106)
Creatinine, Ser: 0.62 mg/dL (ref 0.57–1.00)
Globulin, Total: 2.3 g/dL (ref 1.5–4.5)
Glucose: 79 mg/dL (ref 70–99)
Potassium: 4.1 mmol/L (ref 3.5–5.2)
Sodium: 141 mmol/L (ref 134–144)
Total Protein: 6.5 g/dL (ref 6.0–8.5)
eGFR: 110 mL/min/1.73 (ref >59)

## 2024-03-23 LAB — LIPID PANEL
Chol/HDL Ratio: 3 ratio (ref 0.0–4.4)
Cholesterol, Total: 202 mg/dL — ABNORMAL HIGH (ref 100–199)
HDL: 67 mg/dL (ref >39)
LDL Chol Calc (NIH): 127 mg/dL — ABNORMAL HIGH (ref 0–99)
Triglycerides: 45 mg/dL (ref 0–149)
VLDL Cholesterol Cal: 8 mg/dL (ref 5–40)

## 2024-03-23 LAB — HEMOGLOBIN A1C
Est. average glucose Bld gHb Est-mCnc: 100 mg/dL
Hgb A1c MFr Bld: 5.1 % (ref 4.8–5.6)

## 2024-03-23 LAB — VITAMIN D 25 HYDROXY (VIT D DEFICIENCY, FRACTURES): Vit D, 25-Hydroxy: 33.7 ng/mL (ref 30.0–100.0)

## 2024-03-23 LAB — INSULIN, RANDOM: INSULIN: 3.7 u[IU]/mL (ref 2.6–24.9)

## 2024-04-20 ENCOUNTER — Ambulatory Visit (INDEPENDENT_AMBULATORY_CARE_PROVIDER_SITE_OTHER): Admitting: Adult Health

## 2024-05-02 ENCOUNTER — Encounter (INDEPENDENT_AMBULATORY_CARE_PROVIDER_SITE_OTHER): Payer: Self-pay | Admitting: Adult Health

## 2024-05-02 ENCOUNTER — Ambulatory Visit (INDEPENDENT_AMBULATORY_CARE_PROVIDER_SITE_OTHER): Admitting: Adult Health

## 2024-05-02 VITALS — BP 128/86 | HR 80 | Ht 64.0 in | Wt 167.0 lb

## 2024-05-02 DIAGNOSIS — E782 Mixed hyperlipidemia: Secondary | ICD-10-CM

## 2024-05-02 DIAGNOSIS — I10 Essential (primary) hypertension: Secondary | ICD-10-CM | POA: Diagnosis not present

## 2024-05-02 DIAGNOSIS — Z6828 Body mass index (BMI) 28.0-28.9, adult: Secondary | ICD-10-CM

## 2024-05-02 DIAGNOSIS — E88819 Insulin resistance, unspecified: Secondary | ICD-10-CM | POA: Diagnosis not present

## 2024-05-02 DIAGNOSIS — Z6841 Body Mass Index (BMI) 40.0 and over, adult: Secondary | ICD-10-CM

## 2024-05-02 DIAGNOSIS — E669 Obesity, unspecified: Secondary | ICD-10-CM

## 2024-05-02 DIAGNOSIS — E559 Vitamin D deficiency, unspecified: Secondary | ICD-10-CM

## 2024-05-02 DIAGNOSIS — R632 Polyphagia: Secondary | ICD-10-CM | POA: Diagnosis not present

## 2024-05-02 NOTE — Progress Notes (Unsigned)
 WEIGHT SUMMARY AND BIOMETRICS  Vitals BP: 128/86 Pulse Rate: 80 SpO2: 98 %   Anthropometric Measurements Height: 5' 4 (1.626 m) Weight: 167 lb (75.8 kg) BMI (Calculated): 28.65 Weight at Last Visit: 158lb Weight Lost Since Last Visit: 0l b Weight Gained Since Last Visit: 9lb Starting Weight: 236lb Total Weight Loss (lbs): 69 lb (31.3 kg)   Body Composition  Body Fat %: 36.7 % Fat Mass (lbs): 61.4 lbs Muscle Mass (lbs): 100.6 lbs Total Body Water (lbs): 72.2 lbs Visceral Fat Rating : 8   Other Clinical Data RMR: 1354 Fasting: No Labs: no Today's Visit #: 41 Starting Date: 01/30/22    Chief Complaint:   OBESITY Stacey Singh is here to discuss her progress with her obesity treatment plan. She is on the {MWMwtlossportion/plan2:23431} and states she is following her eating plan approximately *** % of the time.  She states she is exercising *** minutes *** times per week.  Interim History: *** Hunger/appetite-*** Cravings- *** Stress- *** Sleep- *** Exercise-*** Hydration-***  Subjective:   1. Polyphagia ***  2. Benign essential HTN ***  3. Insulin  resistance ***  4. Mixed hyperlipidemia ***  5. Vitamin D  deficiency    Assessment/Plan:   1. Polyphagia   2. Benign essential HTN ***  3. Insulin  resistance (Primary) ***  4. Mixed hyperlipidemia ***  5. Vitamin D  deficiency ***  6. Obesity,current BMI 28.7  Stacey Singh {CHL AMB IS/IS NOT:[id number]} currently in the action stage of change. As such, her goal is to {MWMwtloss#1:210800005}. She has agreed to {MWMwtlossportion/plan2:23431}.   Exercise goals: {MWM EXERCISE RECS:23473}  Behavioral modification strategies: {MWMwtlossdietstrategies3:23432}.  Stacey Singh has agreed to follow-up with our clinic in {NUMBER 1-10:22536} weeks. She was informed of the importance of frequent follow-up visits to maximize her success with intensive lifestyle modifications for her multiple health conditions.    ***delete paragraph if no labs orderedBecki was informed we would discuss her lab results at her next visit unless there is a critical issue that needs to be addressed sooner. Stacey Singh agreed to keep her next visit at the agreed upon time to discuss these results.  Objective:   Blood pressure 128/86, pulse 80, height 5' 4 (1.626 m), weight 167 lb (75.8 kg), SpO2 98%. Body mass index is 28.67 kg/m.  General: Cooperative, alert, well developed, in no acute distress. HEENT: Conjunctivae and lids unremarkable. Cardiovascular: Regular rhythm.  Lungs: Normal work of breathing. Neurologic: No focal deficits.   Lab Results  Component Value Date   CREATININE 0.62 03/21/2024   BUN 9 03/21/2024   NA 141 03/21/2024   K 4.1 03/21/2024   CL 105 03/21/2024   CO2 24 03/21/2024   Lab Results  Component Value Date   ALT 16 03/21/2024   AST 18 03/21/2024   ALKPHOS 58 03/21/2024   BILITOT 0.4 03/21/2024   Lab Results  Component Value Date   HGBA1C 5.1 03/21/2024   HGBA1C 5.0 09/29/2023   HGBA1C 5.4 05/26/2023   HGBA1C 5.3 01/28/2022   HGBA1C 5.5 08/12/2021   Lab Results  Component Value Date   INSULIN  3.7 03/21/2024   INSULIN  5.8 09/29/2023   INSULIN  4.3 05/26/2023   INSULIN  6.7 01/28/2022   INSULIN  10.1 08/12/2021   Lab Results  Component Value Date   TSH 4.320 07/28/2022   Lab Results  Component Value Date   CHOL 202 (H) 03/21/2024   HDL 67 03/21/2024   LDLCALC 127 (H) 03/21/2024   TRIG 45 03/21/2024   CHOLHDL 3.0 03/21/2024  Lab Results  Component Value Date   VD25OH 33.7 03/21/2024   VD25OH 49.8 09/29/2023   VD25OH 32.7 05/26/2023   Lab Results  Component Value Date   WBC 10.0 05/20/2015   HGB 10.7 (L) 05/20/2015   HCT 32.0 (L) 05/20/2015   MCV 92.2 05/20/2015   PLT 179 05/20/2015   No results found for: IRON, TIBC, FERRITIN  Attestation Statements:   Reviewed by clinician on day of visit: allergies, medications, problem list, medical history,  surgical history, family history, social history, and previous encounter notes.  Time spent on visit including pre-visit chart review and post-visit care and charting was 28 minutes.   I have reviewed the above documentation for accuracy and completeness, and I agree with the above. -  Husain Costabile d. Kelie Gainey, NP-C

## 2024-05-30 ENCOUNTER — Ambulatory Visit (INDEPENDENT_AMBULATORY_CARE_PROVIDER_SITE_OTHER): Payer: Self-pay | Admitting: Adult Health

## 2024-05-30 ENCOUNTER — Encounter (INDEPENDENT_AMBULATORY_CARE_PROVIDER_SITE_OTHER): Payer: Self-pay | Admitting: Adult Health

## 2024-05-30 VITALS — BP 131/85 | HR 80 | Ht 64.0 in | Wt 166.0 lb

## 2024-05-30 DIAGNOSIS — F3289 Other specified depressive episodes: Secondary | ICD-10-CM

## 2024-05-30 DIAGNOSIS — Z6828 Body mass index (BMI) 28.0-28.9, adult: Secondary | ICD-10-CM

## 2024-05-30 DIAGNOSIS — E88819 Insulin resistance, unspecified: Secondary | ICD-10-CM

## 2024-05-30 DIAGNOSIS — F5089 Other specified eating disorder: Secondary | ICD-10-CM | POA: Diagnosis not present

## 2024-05-30 DIAGNOSIS — E66813 Obesity, class 3: Secondary | ICD-10-CM

## 2024-05-30 DIAGNOSIS — E559 Vitamin D deficiency, unspecified: Secondary | ICD-10-CM

## 2024-05-30 DIAGNOSIS — E669 Obesity, unspecified: Secondary | ICD-10-CM

## 2024-05-30 MED ORDER — BUPROPION HCL ER (SR) 150 MG PO TB12: 150.0000 mg | ORAL_TABLET | Freq: Every day | ORAL | 0 refills | Status: DC

## 2024-05-30 MED ORDER — VITAMIN D (ERGOCALCIFEROL) 1.25 MG (50000 UNIT) PO CAPS: 50000.0000 [IU] | ORAL_CAPSULE | ORAL | 0 refills | Status: AC

## 2024-05-30 NOTE — Progress Notes (Signed)
 WEIGHT SUMMARY AND BIOMETRICS  Vitals BP: 131/85 Pulse Rate: 80 SpO2: 99 %   Anthropometric Measurements Height: 5' 4 (1.626 m) Weight: 166 lb (75.3 kg) BMI (Calculated): 28.48 Weight at Last Visit: 167lb Weight Lost Since Last Visit: 1lb Weight Gained Since Last Visit: 0lb Starting Weight: 236lb Total Weight Loss (lbs): 70 lb (31.8 kg)   Body Composition  Body Fat %: 36.6 % Fat Mass (lbs): 61 lbs Muscle Mass (lbs): 100 lbs Total Body Water (lbs): 71.2 lbs Visceral Fat Rating : 8   Other Clinical Data RMR: 1354 Fasting: No Labs: No Today's Visit #: 42 Starting Date: 01/30/22    Chief Complaint:   OBESITY Stacey Singh is here to discuss her progress with her obesity treatment plan.  She is on the the Category 2 Plan and states she is following her eating plan approximately 80 % of the time.  She states she is exercising walking 30-45 minutes 3-4 times per week.  Interim History:  03/26/2022 Saxenda  1.8mg  was replaced Wegovy  1.7 She as remained on this strength for 14 months. She has spaced out injections Q 14 days   05/30/24 09:00  Weight 166 lb (75.3 kg)  BMI (Calculated) 28.48  Total Weight Loss (lbs) 70 lb (31.8 kg)    05/30/24 09:00  Visceral Fat Rating  8   She has been meal planning/prepping and consistently taking daily Wellbutrin  SR 150mg   Start Mx Phase 05/30/2024  Subjective:   1 . Vitamin D  deficiency  Latest Reference Range & Units 05/26/23 09:13 09/29/23 10:02 03/21/24 09:36  Vitamin D , 25-Hydroxy 30.0 - 100.0 ng/mL 32.7 49.8 33.7   She is  on weekly Ergocalciferol - denies N/V/Muscle Weakness  2. Insulin  resistance  Latest Reference Range & Units 05/26/23 09:13 09/29/23 10:02 03/21/24 09:36  INSULIN  2.6 - 24.9 uIU/mL 4.3 5.8 3.7   03/26/2022 Saxenda  1.8mg  was replaced Wegovy  1.7 She as remained on this strength for 14 months. She has spaced out injections Q 14 days Denies mass in neck, dysphagia, dyspepsia, persistent hoarseness,  abdominal pain, or N/V/C   3. Other Depression, EE BP, HR stable at OV She denies hx of seizure d/o She is on daily Wellbutrin  SR 150mg - she has been taking consistently daily the last few weeks.  Assessment/Plan:   1. Vitamin D  deficiency Refill  Vitamin D , Ergocalciferol , (DRISDOL ) 1.25 MG (50000 UNIT) CAPS capsule Take 1 capsule (50,000 Units total) by mouth every 7 (seven) days. Dispense: 8 capsule, Refills: 0 ordered    2. Insulin  resistance Continue healthy eating and regular exercise Continue bi-weekly Wegovy  1.7 mg  3. Other Depression, EE Refill  buPROPion  (WELLBUTRIN  SR) 150 MG 12 hr tablet Take 1 tablet (150 mg total) by mouth daily. Dispense: 90 tablet, Refills: 0 ordered   4. Obesity,current BMI 28.6  Stacey Singh is currently in the action stage of change. As such, her goal is to maintain weight for now. She has agreed to the Category 2 Plan.   Exercise goals: For substantial health benefits, adults should do at least 150 minutes (2 hours and 30 minutes) a week of moderate-intensity, or 75 minutes (1 hour and 15 minutes) a week of vigorous-intensity aerobic physical activity, or an equivalent combination of moderate- and vigorous-intensity aerobic activity. Aerobic activity should be performed in episodes of at least 10 minutes, and preferably, it should be spread throughout the week.  Behavioral modification strategies: increasing lean protein intake, decreasing simple carbohydrates, increasing vegetables, increasing water intake, no skipping meals, meal planning and  cooking strategies, keeping healthy foods in the home, ways to avoid boredom eating, and planning for success.  Stacey Singh has agreed to follow-up with our clinic in 6 weeks. She was informed of the importance of frequent follow-up visits to maximize her success with intensive lifestyle modifications for her multiple health conditions.   Start Mx Phase 05/30/2024  Objective:   Blood pressure 131/85, pulse 80,  height 5' 4 (1.626 m), weight 166 lb (75.3 kg), SpO2 99%. Body mass index is 28.49 kg/m.  General: Cooperative, alert, well developed, in no acute distress. HEENT: Conjunctivae and lids unremarkable. Cardiovascular: Regular rhythm.  Lungs: Normal work of breathing. Neurologic: No focal deficits.   Lab Results  Component Value Date   CREATININE 0.62 03/21/2024   BUN 9 03/21/2024   NA 141 03/21/2024   K 4.1 03/21/2024   CL 105 03/21/2024   CO2 24 03/21/2024   Lab Results  Component Value Date   ALT 16 03/21/2024   AST 18 03/21/2024   ALKPHOS 58 03/21/2024   BILITOT 0.4 03/21/2024   Lab Results  Component Value Date   HGBA1C 5.1 03/21/2024   HGBA1C 5.0 09/29/2023   HGBA1C 5.4 05/26/2023   HGBA1C 5.3 01/28/2022   HGBA1C 5.5 08/12/2021   Lab Results  Component Value Date   INSULIN  3.7 03/21/2024   INSULIN  5.8 09/29/2023   INSULIN  4.3 05/26/2023   INSULIN  6.7 01/28/2022   INSULIN  10.1 08/12/2021   Lab Results  Component Value Date   TSH 4.320 07/28/2022   Lab Results  Component Value Date   CHOL 202 (H) 03/21/2024   HDL 67 03/21/2024   LDLCALC 127 (H) 03/21/2024   TRIG 45 03/21/2024   CHOLHDL 3.0 03/21/2024   Lab Results  Component Value Date   VD25OH 33.7 03/21/2024   VD25OH 49.8 09/29/2023   VD25OH 32.7 05/26/2023   Lab Results  Component Value Date   WBC 10.0 05/20/2015   HGB 10.7 (L) 05/20/2015   HCT 32.0 (L) 05/20/2015   MCV 92.2 05/20/2015   PLT 179 05/20/2015   No results found for: IRON, TIBC, FERRITIN  Attestation Statements:   Reviewed by clinician on day of visit: allergies, medications, problem list, medical history, surgical history, family history, social history, and previous encounter notes.  I have reviewed the above documentation for accuracy and completeness, and I agree with the above. -  Kwan Shellhammer d. Deneane Stifter, NP-C

## 2024-07-11 ENCOUNTER — Ambulatory Visit (INDEPENDENT_AMBULATORY_CARE_PROVIDER_SITE_OTHER): Admitting: Adult Health

## 2024-07-11 ENCOUNTER — Encounter (INDEPENDENT_AMBULATORY_CARE_PROVIDER_SITE_OTHER): Payer: Self-pay | Admitting: Adult Health

## 2024-07-11 VITALS — BP 124/85 | HR 87 | Temp 98.2°F | Ht 64.0 in | Wt 169.0 lb

## 2024-07-11 DIAGNOSIS — F3289 Other specified depressive episodes: Secondary | ICD-10-CM

## 2024-07-11 DIAGNOSIS — E88819 Insulin resistance, unspecified: Secondary | ICD-10-CM

## 2024-07-11 DIAGNOSIS — E559 Vitamin D deficiency, unspecified: Secondary | ICD-10-CM

## 2024-07-11 DIAGNOSIS — R632 Polyphagia: Secondary | ICD-10-CM

## 2024-07-11 DIAGNOSIS — Z6841 Body Mass Index (BMI) 40.0 and over, adult: Secondary | ICD-10-CM

## 2024-07-11 NOTE — Progress Notes (Signed)
 WEIGHT SUMMARY AND BIOMETRICS  Vitals Temp: 98.2 F (36.8 C) BP: 124/85 Pulse Rate: 87 SpO2: 100 %   Anthropometric Measurements Height: 5' 4 (1.626 m) Weight: 169 lb (76.7 kg) BMI (Calculated): 28.99 Weight at Last Visit: 166lb Weight Lost Since Last Visit: 0lb Weight Gained Since Last Visit: 3lb Starting Weight: 236lb Total Weight Loss (lbs): 67 lb (30.4 kg)   Body Composition  Body Fat %: 37 % Fat Mass (lbs): 62.6 lbs Muscle Mass (lbs): 101.4 lbs Total Body Water (lbs): 70.6 lbs Visceral Fat Rating : 8   Other Clinical Data RMR: 1354 Fasting: No Labs: no Today's Visit #: 32 Starting Date: 01/30/22    Chief Complaint:   OBESITY Stacey Singh is here to discuss her progress with her obesity treatment plan.  She is on the the Category 2 Plan and states she is following her eating plan approximately 75 % of the time.  She states she is exercising Walking 45 minutes 3 times per week.  Interim History:  03/26/2022 Saxenda  1.8mg  was replaced Wegovy  1.7 She as remained on this strength for 14 months. She has  increased injections to weekly due increased appetite  Denies mass in neck, dysphagia, dyspepsia, persistent hoarseness, abdominal pain, or N/V/C   She has one month supply of Wegovy  at home- discussed continuing Wegovy  or converting to cask option Zepbound in [place]  Subjective:   1. Vitamin D  deficiency She has been consistently taking weekly Ergocalciferol - great job!  2. Insulin  resistance She increased Wegovy  1.7mg  from Q10D to Q7D due to increased appetite Denies mass in neck, dysphagia, dyspepsia, persistent hoarseness, abdominal pain, or N/V/C   3. Polyphagia She increased Wegovy  1.7mg  from Q10D to Q7D due to increased appetite Denies mass in neck, dysphagia, dyspepsia, persistent hoarseness, abdominal pain, or N/V/C   4. Other depression with emotional eating She denies SI/HI She reports excellent, stable mood She is on Wellbutrin  SR  150mg  daily  Assessment/Plan:   1. Vitamin D  deficiency (Primary) Continue weekly Ergocalciferol - denies need for refill today  2. Insulin  resistance Complete course of weekly Wegovy  1.7mg  Increase protein to eat least 90g per meal Increase regular walking to at least 4-5 times weekly  3. Polyphagia Increase regular walking to at least 4-5 times weekly Either continue Wegovy  or convert to cash option Zepbound in [place]  4. Other depression with emotional eating Continue healthy eating and increase regular walking to at least 4-5 times weekly  5. Obesity,current BMI 29.1 Patient was counseled on the importance of maintaining healthy lifestyle habits, including balanced nutrition, regular physical activity, and behavioral modifications, while taking antiobesity medication.   Patient verbalized understanding that medication is an adjunct to, not a replacement for, lifestyle changes and that the long-term success and weight maintenance depend on continued adherence to these strategies.  [name] is currently in the action stage of change. As such, her goal is to maintain weight for now. She has agreed to the Category 2 Plan.   Exercise goals: For substantial health benefits, adults should do at least 150 minutes (2 hours and 30 minutes) a week of moderate-intensity, or 75 minutes (1 hour and 15 minutes) a week of vigorous-intensity aerobic physical activity, or an equivalent combination of moderate- and vigorous-intensity aerobic activity. Aerobic activity should be performed in episodes of at least 10 minutes, and preferably, it should be spread throughout the week.  Behavioral modification strategies: increasing lean protein intake, decreasing simple carbohydrates, increasing vegetables, increasing water intake, no skipping  meals, meal planning and cooking strategies, keeping healthy foods in the home, ways to avoid boredom eating, holiday eating strategies , and planning for  success.  [name] has agreed to follow-up with our clinic in 4 weeks. She was informed of the importance of frequent follow-up visits to maximize her success with intensive lifestyle modifications for her multiple health conditions.   Check Fasting Labs at next OV  Objective:   Blood pressure 124/85, pulse 87, temperature 98.2 F (36.8 C), height 5' 4 (1.626 m), weight 169 lb (76.7 kg), SpO2 100%. Body mass index is 29.01 kg/m.  General: Cooperative, alert, well developed, in no acute distress. HEENT: Conjunctivae and lids unremarkable. Cardiovascular: Regular rhythm.  Lungs: Normal work of breathing. Neurologic: No focal deficits.   Lab Results  Component Value Date   CREATININE 0.62 03/21/2024   BUN 9 03/21/2024   NA 141 03/21/2024   K 4.1 03/21/2024   CL 105 03/21/2024   CO2 24 03/21/2024   Lab Results  Component Value Date   ALT 16 03/21/2024   AST 18 03/21/2024   ALKPHOS 58 03/21/2024   BILITOT 0.4 03/21/2024   Lab Results  Component Value Date   HGBA1C 5.1 03/21/2024   HGBA1C 5.0 09/29/2023   HGBA1C 5.4 05/26/2023   HGBA1C 5.3 01/28/2022   HGBA1C 5.5 08/12/2021   Lab Results  Component Value Date   INSULIN  3.7 03/21/2024   INSULIN  5.8 09/29/2023   INSULIN  4.3 05/26/2023   INSULIN  6.7 01/28/2022   INSULIN  10.1 08/12/2021   Lab Results  Component Value Date   TSH 4.320 07/28/2022   Lab Results  Component Value Date   CHOL 202 (H) 03/21/2024   HDL 67 03/21/2024   LDLCALC 127 (H) 03/21/2024   TRIG 45 03/21/2024   CHOLHDL 3.0 03/21/2024   Lab Results  Component Value Date   VD25OH 33.7 03/21/2024   VD25OH 49.8 09/29/2023   VD25OH 32.7 05/26/2023   Lab Results  Component Value Date   WBC 10.0 05/20/2015   HGB 10.7 (L) 05/20/2015   HCT 32.0 (L) 05/20/2015   MCV 92.2 05/20/2015   PLT 179 05/20/2015   No results found for: IRON, TIBC, FERRITIN  Attestation Statements:   Reviewed by clinician on day of visit: allergies, medications,  problem list, medical history, surgical history, family history, social history, and previous encounter notes.  Time spent on visit including pre-visit chart review and post-visit care and charting was 26 minutes.   I have reviewed the above documentation for accuracy and completeness, and I agree with the above. -  Latonya Nelon d. Verneda Hollopeter, NP-C

## 2024-08-15 ENCOUNTER — Ambulatory Visit (INDEPENDENT_AMBULATORY_CARE_PROVIDER_SITE_OTHER): Admitting: Adult Health

## 2024-08-15 ENCOUNTER — Encounter (INDEPENDENT_AMBULATORY_CARE_PROVIDER_SITE_OTHER): Payer: Self-pay | Admitting: Adult Health

## 2024-08-15 VITALS — BP 114/78 | HR 86 | Temp 98.2°F | Ht 64.0 in | Wt 166.0 lb

## 2024-08-15 DIAGNOSIS — F5089 Other specified eating disorder: Secondary | ICD-10-CM

## 2024-08-15 DIAGNOSIS — R632 Polyphagia: Secondary | ICD-10-CM | POA: Diagnosis not present

## 2024-08-15 DIAGNOSIS — E669 Obesity, unspecified: Secondary | ICD-10-CM | POA: Diagnosis not present

## 2024-08-15 DIAGNOSIS — Z6828 Body mass index (BMI) 28.0-28.9, adult: Secondary | ICD-10-CM | POA: Diagnosis not present

## 2024-08-15 DIAGNOSIS — E559 Vitamin D deficiency, unspecified: Secondary | ICD-10-CM | POA: Diagnosis not present

## 2024-08-15 DIAGNOSIS — F3289 Other specified depressive episodes: Secondary | ICD-10-CM

## 2024-08-15 DIAGNOSIS — Z6841 Body Mass Index (BMI) 40.0 and over, adult: Secondary | ICD-10-CM

## 2024-08-15 DIAGNOSIS — I10 Essential (primary) hypertension: Secondary | ICD-10-CM | POA: Diagnosis not present

## 2024-08-15 DIAGNOSIS — E88819 Insulin resistance, unspecified: Secondary | ICD-10-CM

## 2024-08-15 MED ORDER — ZEPBOUND 5 MG/0.5ML ~~LOC~~ SOLN: 5.0000 mg | SUBCUTANEOUS | 1 refills | Status: AC

## 2024-08-15 MED ORDER — BUPROPION HCL ER (SR) 150 MG PO TB12: 150.0000 mg | ORAL_TABLET | Freq: Every day | ORAL | 0 refills | Status: AC

## 2024-08-15 NOTE — Progress Notes (Signed)
 "    WEIGHT SUMMARY AND BIOMETRICS  Vitals Temp: 98.2 F (36.8 C) BP: 114/78 Pulse Rate: 86 SpO2: 100 %   Anthropometric Measurements Height: 5' 4 (1.626 m) Weight: 166 lb (75.3 kg) BMI (Calculated): 28.48 Weight at Last Visit: 169lb Weight Lost Since Last Visit: 3lb Weight Gained Since Last Visit: 0lb Starting Weight: 236lb Total Weight Loss (lbs): 70 lb (31.8 kg)   Body Composition  Body Fat %: 37.6 % Fat Mass (lbs): 62.8 lbs Muscle Mass (lbs): 98.8 lbs Total Body Water (lbs): 69 lbs Visceral Fat Rating : 8   Other Clinical Data RMR: 1354 Fasting: yes Labs: Yes Today's Visit #: 49 Starting Date: 01/30/22    Chief Complaint:   OBESITY Stacey Singh is here to discuss her progress with her obesity treatment plan.  She is on the the Category 2 Plan and states she is following her eating plan approximately 75 % of the time.  She states she is exercising Walking 30 minutes 4 times per week.  Interim History:  Her last Wegovy  1.7mg  injection on 08/07/2024 She has exhausted her home supply from her mother in law  She denies family hx of MENS 2 or MTC She denies personal hx of pancreatitis She had  tubal ligation 2016  She is agreeable to starting Zepbound  via vial program  Subjective:   1. Benign essential HTN BP excellent at OV She denies CP with exertion She is not currently on antihypertensive therapy  2. Vitamin D  deficiency  Latest Reference Range & Units 05/26/23 09:13 09/29/23 10:02 03/21/24 09:36  Vitamin D , 25-Hydroxy 30.0 - 100.0 ng/mL 32.7 49.8 33.7   She is on weekly Ergocalciferol - denies N/V/Muscle Weakness  3. Insulin  resistance Her last Wegovy  1.7mg  injection on 08/07/2024 She has exhausted her home supply from her mother in law  She denies family hx of MENS 2 or MTC She denies personal hx of pancreatitis She had  tubal ligation 2016  She is agreeable to starting Zepbound  via vial program  4. Polyphagia Her last Wegovy  1.7mg   injection on 08/07/2024 She has exhausted her home supply from her mother in law  She denies family hx of MENS 2 or MTC She denies personal hx of pancreatitis She had  tubal ligation 2016  She is agreeable to starting Zepbound  via vial program  6. Other depression with emotional eating She endorses stable mood, denies SI/HI She is on daily Wellbutrin  SR 150mg   Assessment/Plan:   1. Benign essential HTN Check Labs - Comprehensive metabolic panel with GFR  2. Vitamin D  deficiency (Primary) Check Labs - VITAMIN D  25 Hydroxy (Vit-D Deficiency, Fractures)  3. Insulin  resistance Check Labs - Hemoglobin A1c - Insulin , random  4. Polyphagia Remain off Wegovy   Start  tirzepatide  (ZEPBOUND ) 5 MG/0.5ML injection vial Inject 5 mg into the skin once a week. Dispense: 2 mL, Refills: 1 ordered   Administer 2.5mg  via sterile technique  5. Other depression with emotional eating Refill - buPROPion  (WELLBUTRIN  SR) 150 MG 12 hr tablet; Take 1 tablet (150 mg total) by mouth daily.  Dispense: 90 tablet; Refill: 0  6. Obesity,current BMI 28.6 Remain off Wegovy   Start  tirzepatide  (ZEPBOUND ) 5 MG/0.5ML injection vial Inject 5 mg into the skin once a week. Dispense: 2 mL, Refills: 1 ordered   Administer 2.5mg  via sterile technique  Stacey Singh is currently in the action stage of change. As such, her goal is to continue with weight loss efforts. She has agreed to the Category 2 Plan.  Exercise goals: For substantial health benefits, adults should do at least 150 minutes (2 hours and 30 minutes) a week of moderate-intensity, or 75 minutes (1 hour and 15 minutes) a week of vigorous-intensity aerobic physical activity, or an equivalent combination of moderate- and vigorous-intensity aerobic activity. Aerobic activity should be performed in episodes of at least 10 minutes, and preferably, it should be spread throughout the week.  Behavioral modification strategies: increasing lean protein intake,  decreasing simple carbohydrates, increasing vegetables, increasing water intake, no skipping meals, meal planning and cooking strategies, keeping healthy foods in the home, ways to avoid boredom eating, and planning for success.  Stacey Singh has agreed to follow-up with our clinic in 4 weeks. She was informed of the importance of frequent follow-up visits to maximize her success with intensive lifestyle modifications for her multiple health conditions.   Stacey Singh was informed we would discuss her lab results at her next visit unless there is a critical issue that needs to be addressed sooner. Stacey Singh agreed to keep her next visit at the agreed upon time to discuss these results.  Objective:   Blood pressure 114/78, pulse 86, temperature 98.2 F (36.8 C), height 5' 4 (1.626 m), weight 166 lb (75.3 kg), SpO2 100%. Body mass index is 28.49 kg/m.  General: Cooperative, alert, well developed, in no acute distress. HEENT: Conjunctivae and lids unremarkable. Cardiovascular: Regular rhythm.  Lungs: Normal work of breathing. Neurologic: No focal deficits.   Lab Results  Component Value Date   CREATININE 0.62 03/21/2024   BUN 9 03/21/2024   NA 141 03/21/2024   K 4.1 03/21/2024   CL 105 03/21/2024   CO2 24 03/21/2024   Lab Results  Component Value Date   ALT 16 03/21/2024   AST 18 03/21/2024   ALKPHOS 58 03/21/2024   BILITOT 0.4 03/21/2024   Lab Results  Component Value Date   HGBA1C 5.1 03/21/2024   HGBA1C 5.0 09/29/2023   HGBA1C 5.4 05/26/2023   HGBA1C 5.3 01/28/2022   HGBA1C 5.5 08/12/2021   Lab Results  Component Value Date   INSULIN  3.7 03/21/2024   INSULIN  5.8 09/29/2023   INSULIN  4.3 05/26/2023   INSULIN  6.7 01/28/2022   INSULIN  10.1 08/12/2021   Lab Results  Component Value Date   TSH 4.320 07/28/2022   Lab Results  Component Value Date   CHOL 202 (H) 03/21/2024   HDL 67 03/21/2024   LDLCALC 127 (H) 03/21/2024   TRIG 45 03/21/2024   CHOLHDL 3.0 03/21/2024   Lab  Results  Component Value Date   VD25OH 33.7 03/21/2024   VD25OH 49.8 09/29/2023   VD25OH 32.7 05/26/2023   Lab Results  Component Value Date   WBC 10.0 05/20/2015   HGB 10.7 (L) 05/20/2015   HCT 32.0 (L) 05/20/2015   MCV 92.2 05/20/2015   PLT 179 05/20/2015   No results found for: IRON, TIBC, FERRITIN  Attestation Statements:   Reviewed by clinician on day of visit: allergies, medications, problem list, medical history, surgical history, family history, social history, and previous encounter notes.  I have reviewed the above documentation for accuracy and completeness, and I agree with the above. -  Katheren Jimmerson d. Chrishelle Zito, NP-C "

## 2024-08-16 LAB — COMPREHENSIVE METABOLIC PANEL WITH GFR
ALT: 6 IU/L (ref 0–32)
AST: 13 IU/L (ref 0–40)
Albumin: 4.1 g/dL (ref 3.9–4.9)
Alkaline Phosphatase: 60 IU/L (ref 41–116)
BUN/Creatinine Ratio: 13 (ref 9–23)
BUN: 9 mg/dL (ref 6–24)
Bilirubin Total: 0.2 mg/dL (ref 0.0–1.2)
CO2: 21 mmol/L (ref 20–29)
Calcium: 9.1 mg/dL (ref 8.7–10.2)
Chloride: 107 mmol/L — ABNORMAL HIGH (ref 96–106)
Creatinine, Ser: 0.71 mg/dL (ref 0.57–1.00)
Globulin, Total: 2.4 g/dL (ref 1.5–4.5)
Glucose: 82 mg/dL (ref 70–99)
Potassium: 4.6 mmol/L (ref 3.5–5.2)
Sodium: 142 mmol/L (ref 134–144)
Total Protein: 6.5 g/dL (ref 6.0–8.5)
eGFR: 105 mL/min/1.73

## 2024-08-16 LAB — VITAMIN D 25 HYDROXY (VIT D DEFICIENCY, FRACTURES): Vit D, 25-Hydroxy: 37.9 ng/mL (ref 30.0–100.0)

## 2024-08-16 LAB — HEMOGLOBIN A1C
Est. average glucose Bld gHb Est-mCnc: 97 mg/dL
Hgb A1c MFr Bld: 5 % (ref 4.8–5.6)

## 2024-08-16 LAB — INSULIN, RANDOM: INSULIN: 6 u[IU]/mL (ref 2.6–24.9)

## 2024-09-13 ENCOUNTER — Ambulatory Visit (INDEPENDENT_AMBULATORY_CARE_PROVIDER_SITE_OTHER): Admitting: Adult Health
